# Patient Record
Sex: Female | Born: 1970
Health system: Southern US, Community
[De-identification: ages and names within clinical notes are randomized; demographics above are authoritative.]

## PROBLEM LIST (undated history)

## (undated) DIAGNOSIS — J45909 Unspecified asthma, uncomplicated: Secondary | ICD-10-CM

## (undated) DIAGNOSIS — Z862 Personal history of diseases of the blood and blood-forming organs and certain disorders involving the immune mechanism: Secondary | ICD-10-CM

## (undated) DIAGNOSIS — N83209 Unspecified ovarian cyst, unspecified side: Secondary | ICD-10-CM

## (undated) DIAGNOSIS — K449 Diaphragmatic hernia without obstruction or gangrene: Secondary | ICD-10-CM

## (undated) DIAGNOSIS — E079 Disorder of thyroid, unspecified: Secondary | ICD-10-CM

## (undated) DIAGNOSIS — E039 Hypothyroidism, unspecified: Secondary | ICD-10-CM

## (undated) DIAGNOSIS — K760 Fatty (change of) liver, not elsewhere classified: Secondary | ICD-10-CM

## (undated) DIAGNOSIS — K219 Gastro-esophageal reflux disease without esophagitis: Secondary | ICD-10-CM

## (undated) DIAGNOSIS — Z8719 Personal history of other diseases of the digestive system: Secondary | ICD-10-CM

## (undated) DIAGNOSIS — R0600 Dyspnea, unspecified: Secondary | ICD-10-CM

## (undated) DIAGNOSIS — Z973 Presence of spectacles and contact lenses: Secondary | ICD-10-CM

## (undated) DIAGNOSIS — N6009 Solitary cyst of unspecified breast: Secondary | ICD-10-CM

## (undated) DIAGNOSIS — D229 Melanocytic nevi, unspecified: Secondary | ICD-10-CM

## (undated) DIAGNOSIS — J302 Other seasonal allergic rhinitis: Secondary | ICD-10-CM

## (undated) DIAGNOSIS — Z90710 Acquired absence of both cervix and uterus: Secondary | ICD-10-CM

## (undated) DIAGNOSIS — Z9079 Acquired absence of other genital organ(s): Secondary | ICD-10-CM

## (undated) DIAGNOSIS — K851 Biliary acute pancreatitis without necrosis or infection: Secondary | ICD-10-CM

## (undated) DIAGNOSIS — N920 Excessive and frequent menstruation with regular cycle: Secondary | ICD-10-CM

## (undated) DIAGNOSIS — K829 Disease of gallbladder, unspecified: Secondary | ICD-10-CM

## (undated) DIAGNOSIS — K589 Irritable bowel syndrome without diarrhea: Secondary | ICD-10-CM

## (undated) DIAGNOSIS — M722 Plantar fascial fibromatosis: Secondary | ICD-10-CM

## (undated) DIAGNOSIS — F411 Generalized anxiety disorder: Secondary | ICD-10-CM

## (undated) DIAGNOSIS — F419 Anxiety disorder, unspecified: Secondary | ICD-10-CM

## (undated) DIAGNOSIS — K5900 Constipation, unspecified: Secondary | ICD-10-CM

## (undated) DIAGNOSIS — R928 Other abnormal and inconclusive findings on diagnostic imaging of breast: Secondary | ICD-10-CM

## (undated) DIAGNOSIS — D649 Anemia, unspecified: Secondary | ICD-10-CM

## (undated) DIAGNOSIS — Z90722 Acquired absence of ovaries, bilateral: Secondary | ICD-10-CM

## (undated) DIAGNOSIS — E669 Obesity, unspecified: Secondary | ICD-10-CM

## (undated) HISTORY — DX: Dyspnea, unspecified: R06.00

## (undated) HISTORY — DX: Obesity, unspecified: E66.9

## (undated) HISTORY — DX: Generalized anxiety disorder: F41.1

## (undated) HISTORY — DX: Constipation, unspecified: K59.00

## (undated) HISTORY — PX: TUBAL LIGATION: SHX77

## (undated) HISTORY — DX: Acquired absence of other genital organ(s): Z90.79

## (undated) HISTORY — DX: Anemia, unspecified: D64.9

## (undated) HISTORY — DX: Excessive and frequent menstruation with regular cycle: N92.0

## (undated) HISTORY — DX: Gastro-esophageal reflux disease without esophagitis: K21.9

## (undated) HISTORY — DX: Fatty (change of) liver, not elsewhere classified: K76.0

## (undated) HISTORY — DX: Other seasonal allergic rhinitis: J30.2

## (undated) HISTORY — DX: Diaphragmatic hernia without obstruction or gangrene: K44.9

## (undated) HISTORY — DX: Solitary cyst of unspecified breast: N60.09

## (undated) HISTORY — PX: BREAST EXCISIONAL BIOPSY: SUR124

## (undated) HISTORY — DX: Unspecified ovarian cyst, unspecified side: N83.209

## (undated) HISTORY — DX: Acquired absence of both cervix and uterus: Z90.710

## (undated) HISTORY — DX: Acquired absence of ovaries, bilateral: Z90.722

## (undated) HISTORY — DX: Plantar fascial fibromatosis: M72.2

## (undated) HISTORY — PX: BREAST BIOPSY: SHX20

## (undated) HISTORY — DX: Melanocytic nevi, unspecified: D22.9

## (undated) HISTORY — DX: Irritable bowel syndrome without diarrhea: K58.9

## (undated) HISTORY — DX: Biliary acute pancreatitis without necrosis or infection: K85.10

## (undated) HISTORY — DX: Disease of gallbladder, unspecified: K82.9

## (undated) HISTORY — PX: ABDOMINAL HYSTERECTOMY: SHX81

---

## 2014-10-20 ENCOUNTER — Emergency Department (HOSPITAL_COMMUNITY): Payer: 59

## 2014-10-20 ENCOUNTER — Encounter (HOSPITAL_COMMUNITY): Payer: Self-pay | Admitting: Emergency Medicine

## 2014-10-20 ENCOUNTER — Telehealth: Payer: Self-pay | Admitting: Family Medicine

## 2014-10-20 ENCOUNTER — Emergency Department (HOSPITAL_COMMUNITY)
Admission: EM | Admit: 2014-10-20 | Discharge: 2014-10-20 | Disposition: A | Discharge status: Home / Self Care | Payer: 59 | Attending: Emergency Medicine | Admitting: Emergency Medicine

## 2014-10-20 DIAGNOSIS — R0602 Shortness of breath: Secondary | ICD-10-CM | POA: Diagnosis present

## 2014-10-20 DIAGNOSIS — Z7951 Long term (current) use of inhaled steroids: Secondary | ICD-10-CM | POA: Diagnosis not present

## 2014-10-20 DIAGNOSIS — Z720 Tobacco use: Secondary | ICD-10-CM | POA: Diagnosis not present

## 2014-10-20 DIAGNOSIS — E079 Disorder of thyroid, unspecified: Secondary | ICD-10-CM | POA: Diagnosis not present

## 2014-10-20 DIAGNOSIS — E669 Obesity, unspecified: Secondary | ICD-10-CM | POA: Diagnosis not present

## 2014-10-20 DIAGNOSIS — R0600 Dyspnea, unspecified: Secondary | ICD-10-CM

## 2014-10-20 DIAGNOSIS — Z3202 Encounter for pregnancy test, result negative: Secondary | ICD-10-CM | POA: Diagnosis not present

## 2014-10-20 DIAGNOSIS — R0609 Other forms of dyspnea: Secondary | ICD-10-CM

## 2014-10-20 DIAGNOSIS — Z79899 Other long term (current) drug therapy: Secondary | ICD-10-CM | POA: Diagnosis not present

## 2014-10-20 DIAGNOSIS — J45901 Unspecified asthma with (acute) exacerbation: Secondary | ICD-10-CM | POA: Insufficient documentation

## 2014-10-20 HISTORY — DX: Disorder of thyroid, unspecified: E07.9

## 2014-10-20 HISTORY — DX: Unspecified asthma, uncomplicated: J45.909

## 2014-10-20 LAB — CBC WITH DIFFERENTIAL/PLATELET
BASOS ABS: 0 10*3/uL (ref 0.0–0.1)
Basophils Relative: 1 % (ref 0–1)
EOS PCT: 5 % (ref 0–5)
Eosinophils Absolute: 0.3 10*3/uL (ref 0.0–0.7)
HCT: 33.1 % — ABNORMAL LOW (ref 36.0–46.0)
Hemoglobin: 10.5 g/dL — ABNORMAL LOW (ref 12.0–15.0)
LYMPHS ABS: 1.7 10*3/uL (ref 0.7–4.0)
LYMPHS PCT: 23 % (ref 12–46)
MCH: 25.4 pg — ABNORMAL LOW (ref 26.0–34.0)
MCHC: 31.7 g/dL (ref 30.0–36.0)
MCV: 80 fL (ref 78.0–100.0)
Monocytes Absolute: 0.6 10*3/uL (ref 0.1–1.0)
Monocytes Relative: 8 % (ref 3–12)
NEUTROS ABS: 4.8 10*3/uL (ref 1.7–7.7)
NEUTROS PCT: 65 % (ref 43–77)
PLATELETS: 484 10*3/uL — AB (ref 150–400)
RBC: 4.14 MIL/uL (ref 3.87–5.11)
RDW: 15.5 % (ref 11.5–15.5)
WBC: 7.4 10*3/uL (ref 4.0–10.5)

## 2014-10-20 LAB — I-STAT CHEM 8, ED
BUN: 11 mg/dL (ref 6–23)
CHLORIDE: 105 mmol/L (ref 96–112)
Calcium, Ion: 1.14 mmol/L (ref 1.12–1.23)
Creatinine, Ser: 0.8 mg/dL (ref 0.50–1.10)
Glucose, Bld: 88 mg/dL (ref 70–99)
HCT: 34 % — ABNORMAL LOW (ref 36.0–46.0)
HEMOGLOBIN: 11.6 g/dL — AB (ref 12.0–15.0)
POTASSIUM: 3.9 mmol/L (ref 3.5–5.1)
Sodium: 140 mmol/L (ref 135–145)
TCO2: 20 mmol/L (ref 0–100)

## 2014-10-20 LAB — I-STAT TROPONIN, ED: TROPONIN I, POC: 0 ng/mL (ref 0.00–0.08)

## 2014-10-20 LAB — TSH: TSH: 3.617 u[IU]/mL (ref 0.350–4.500)

## 2014-10-20 LAB — POC URINE PREG, ED: PREG TEST UR: NEGATIVE

## 2014-10-20 LAB — D-DIMER, QUANTITATIVE: D-Dimer, Quant: 0.59 ug/mL-FEU — ABNORMAL HIGH (ref 0.00–0.48)

## 2014-10-20 LAB — BRAIN NATRIURETIC PEPTIDE: B Natriuretic Peptide: 74.4 pg/mL (ref 0.0–100.0)

## 2014-10-20 MED ORDER — LEVOTHYROXINE SODIUM 50 MCG PO TABS: 75.0000 ug | ORAL_TABLET | Freq: Every day | ORAL | Status: DC

## 2014-10-20 MED ORDER — IOHEXOL 350 MG/ML SOLN
100.0000 mL | Freq: Once | INTRAVENOUS | Status: AC | PRN
  Administered 2014-10-20: 100 mL via INTRAVENOUS

## 2014-10-20 MED ORDER — CITALOPRAM HYDROBROMIDE 20 MG PO TABS: 20.0000 mg | ORAL_TABLET | Freq: Every day | ORAL | Status: DC

## 2014-10-20 NOTE — Telephone Encounter (Signed)
Pt states the breast center will not see her w/out referrral.  They will not accept one from the ED dept.  D=should we do an appt for the referral/breast mass and still keep the est appt? Pt is very anxious and wants to get diagnosotic mammogram asap.

## 2014-10-20 NOTE — Telephone Encounter (Signed)
Pt has been scheduled.  °

## 2014-10-20 NOTE — Telephone Encounter (Signed)
Pt went to ed today for upper resp issue.  Has an appt to est w/ you on 4/8.  However, when they did a chest xray, they found a mass in her breast.  Pt would like to know if she could get in sooner. gave pt number for the breast center, and will cb if she needs further assistance.

## 2014-10-20 NOTE — ED Notes (Signed)
Pt transported to CT. Husband remains in room. Drink offered to husband however he declines.

## 2014-10-20 NOTE — ED Notes (Signed)
Pt returned to CT. 

## 2014-10-20 NOTE — Progress Notes (Signed)
  CARE MANAGEMENT ED NOTE 10/20/2014  Patient:  Rachels,Honore   Account Number:  [id number]  Date Initiated:  10/20/2014  Documentation initiated by:  Jackelyn Poling  Subjective/Objective Assessment:   44 yr old [place] umr pt c/o sob chest pain     Subjective/Objective Assessment Detail:   Pcp  but appt is in weeks  Pt mentioned concerns with getting a mammogram after informed she had a cyst  CM discussed with pt nurse  CM provided pt with breast cent [address] [place] and discussed referral from pcp or ob gyn Pt is aware how to obtain a list of pcp, ob gyns from [employer] website  Husband at bedside     Action/Plan:   CM inquired about pcp [facility] ED CM spoke with pt on how to obtain an in network pcp with insurance coverage via the customer service number or web site  Cm reviewed ED level of care for crisis/emergent services and community pcp level of care   Action/Plan Detail:   to manage continuous or chronic medical concerns.  Pt voiced understanding CM encouraged pt & discussed pt's responsibility to verify with pt's insurance carrier that any recommended medical provider offered by any dr Jobe Igo understanding   Anticipated DC Date:       Status Recommendation to Physician:   Result of Recommendation:    Other ED Services  Consult Working [place]  Other  Outpatient Services - Pt will follow up  PCP issues    Choice offered to / List presented to:            Status of service:  Completed, signed off  ED Comments:   ED Comments Detail:

## 2014-10-20 NOTE — ED Provider Notes (Signed)
CSN: [id number]     Arrival date & time 10/20/14  0813 History   First MD Initiated Contact with Patient 10/20/14 [phone]     Chief Complaint  Patient presents with  . Shortness of Breath     (Consider location/radiation/quality/duration/timing/severity/associated sxs/prior Treatment) Patient is a 44 y.o. female presenting with shortness of breath. The history is provided by the patient and the spouse. No language interpreter was used.  Shortness of Breath Associated symptoms: chest pain   Associated symptoms: no abdominal pain, no fever, no headaches and no vomiting   Maria [name] has a history of asthma and hypothyroidism and presents today for several weeks of shortness of breath on exertion and a few intermittent sharp chest pain epidoses that last a few seconds.  She says the shortness of breath has been worse in the last 2 days when walking short distances. She is also complaining of cold intolerance and fatigue. She has not had a TSH level checked in over a year. She has never had a cardiac workup, no stress test, no ECHO. She is not on hormone therapy, no history of DVT. She denies any fever, nausea, vomiting, abdominal pain, hemoptysis, or leg pain. Past Medical History  Diagnosis Date  . Thyroid disease   . Asthma    Past Surgical History  Procedure Laterality Date  . Cesarean section     No family history on file. History  Substance Use Topics  . Smoking status: Current Some Day Smoker  . Smokeless tobacco: Not on file  . Alcohol Use: Yes     Comment: socially   OB History    No data available     Review of Systems  Constitutional: Negative for fever and chills.  Respiratory: Positive for shortness of breath.   Cardiovascular: Positive for chest pain.  Gastrointestinal: Negative for nausea, vomiting, abdominal pain and blood in stool.  Endocrine: Positive for cold intolerance.  Neurological: Negative for syncope and headaches.  All other systems reviewed and are  negative.     Allergies  Review of patient's allergies indicates no known allergies.  Home Medications   Prior to Admission medications   Medication Sig Start Date End Date Taking? Authorizing Provider  acetaminophen (TYLENOL) 500 MG tablet Take 1,500 mg by mouth every 6 (six) hours as needed for mild pain, moderate pain or headache.   Yes Historical Provider, MD  albuterol (PROVENTIL HFA;VENTOLIN HFA) 108 (90 BASE) MCG/ACT inhaler Inhale 1 puff into the lungs every 6 (six) hours as needed for wheezing or shortness of breath.   Yes Historical Provider, MD  citalopram (CELEXA) 20 MG tablet Take 1 tablet (20 mg total) by mouth daily. 10/20/14   Maria Nordstrom Patel-Mills, PA-C  Fluticasone-Salmeterol (ADVAIR) 250-50 MCG/DOSE AEPB Inhale 1 puff into the lungs daily as needed (for shortness of breath).   Yes Historical Provider, MD  levothyroxine (SYNTHROID, LEVOTHROID) 50 MCG tablet Take 1.5 tablets (75 mcg total) by mouth daily before breakfast. 10/20/14   Maria Glazier, PA-C  loratadine (CLARITIN) 10 MG tablet Take 10 mg by mouth daily.   Yes Historical Provider, MD   BP 126/69 mmHg  Pulse 66  Temp(Src) 98 F (36.7 C) (Oral)  Resp 16  SpO2 100%  LMP 10/20/2014 Physical Exam  Constitutional: She is oriented to person, place, and time. She appears well-developed and well-nourished.  HENT:  Head: Normocephalic and atraumatic.  Eyes: Conjunctivae are normal.  Neck: Normal range of motion. Neck supple.  Cardiovascular: Normal rate, regular rhythm and normal  heart sounds.   Pulmonary/Chest: Effort normal and breath sounds normal. No respiratory distress. She has no decreased breath sounds. She has no wheezes. She has no rales.  She is breathing comfortably on exam without use of accessory muscles.  She has 100% oxygen saturation at bedside. No reproducible chest wall tenderness.   Abdominal: Soft. There is no tenderness.  Obese  Musculoskeletal: Normal range of motion.  No leg or calf  tenderness.   Neurological: She is alert and oriented to person, place, and time.  Skin: Skin is warm and dry.  Nursing note and vitals reviewed.   ED Course  Procedures (including critical care time) Labs Review Labs Reviewed  CBC WITH DIFFERENTIAL/PLATELET - Abnormal; Notable for the following:    Hemoglobin 10.5 (*)    HCT 33.1 (*)    MCH 25.4 (*)    Platelets 484 (*)    All other components within normal limits  D-DIMER, QUANTITATIVE - Abnormal; Notable for the following:    D-Dimer, Quant 0.59 (*)    All other components within normal limits  I-STAT CHEM 8, ED - Abnormal; Notable for the following:    Hemoglobin 11.6 (*)    HCT 34.0 (*)    All other components within normal limits  TSH  BRAIN NATRIURETIC PEPTIDE  I-STAT TROPOININ, ED  POC URINE PREG, ED    Imaging Review Dg Chest 2 View  10/20/2014   CLINICAL DATA:  Shortness of breath. Central chest pain, fatigue for several days.  EXAM: CHEST  2 VIEW  COMPARISON:  None.  FINDINGS: The heart size and mediastinal contours are within normal limits. Both lungs are clear. The visualized skeletal structures are unremarkable.  IMPRESSION: No active cardiopulmonary disease.   Electronically Signed   By: Rolm Baptise M.D.   On: 10/20/2014 09:27   Ct Angio Chest Pe W/cm &/or Wo Cm  10/20/2014   CLINICAL DATA:  Dyspnea, chest pain for 2 weeks  EXAM: CT ANGIOGRAPHY CHEST WITH CONTRAST  TECHNIQUE: Multidetector CT imaging of the chest was performed using the standard protocol during bolus administration of intravenous contrast. Multiplanar CT image reconstructions and MIPs were obtained to evaluate the vascular anatomy.  CONTRAST:  151mL OMNIPAQUE IOHEXOL 350 MG/ML SOLN  COMPARISON:  None.  FINDINGS: There is probable a cyst within left breast measures 2.8 cm. Clinical correlation is necessary. Further correlation with mammogram could be performed.  Images of the thoracic inlet are unremarkable. Central airways are patent. No mediastinal  hematoma or adenopathy.  No pulmonary embolus is noted.  Images of the lung parenchyma shows no acute infiltrate or pleural effusion. No focal consolidation. No pulmonary nodules. There is no pneumothorax. No pericardial effusion is noted. Heart size within normal limits. The visualized upper abdomen is unremarkable.  Review of the MIP images confirms the above findings.  IMPRESSION: 1. No pulmonary embolus is noted. 2. No acute infiltrate or pulmonary edema. 3. No mediastinal hematoma or adenopathy. Probable cyst within left breast measures 2.8 cm. Clinical correlation is necessary. Further evaluation with mammogram could be performed.   Electronically Signed   By: Lahoma Crocker M.D.   On: 10/20/2014 12:35     EKG Interpretation   Date/Time:  Monday October 20 2014 08:25:16 EST Ventricular Rate:  77 PR Interval:  146 QRS Duration: 103 QT Interval:  438 QTC Calculation: 496 R Axis:   51 Text Interpretation:  Sinus rhythm Borderline prolonged QT interval No  acute changes No old tracing to compare Confirmed by NANAVATI,  MD, Thelma Comp  [phone]) on 10/20/2014 8:28:37 AM      MDM   Final diagnoses:  Dyspnea  Exertional dyspnea   Dr. Kathrynn Humble and I looked at both [facility] criteria for pulmonary embolism as well as PERC criteria.  She does not have any risk factors for PE and is not tachycardic.  She is at low risk but due to her shortness of breath on minimal exertion, will order D-dimer.   CBC shows Hgb of 10.5 but patient states she has a heavy mestral cycle. Her creatinine and electrolytes are normal. TSH is normal, BNP normal, troponin is negative and EKG is normal sinus rhythm at a rate of 77. CXR shows no cardiopulmonary disease.  D-dimer is elevated at .59.  I have ordered a urine pregnancy and CTA.   10:22 Spoke with patient and spouse regarding CTA and they agree with the plan.   12:57 Discussed CTA findings of no acute PE and no pulmonary edema. I also discussed the cyst within the left breast  measuring 2.8 cm and to f/u with a PCP in the next 2 days for a mammogram. I did a breast exam and could feel a small, mobile cyst at the 7o'clock position.  No signs of abscess or infection.  She is feeling better and has 100% oxygen stat with no shortness of breath. Will discharge her with f/u for shortness of breath and left breast cyst.  Her and her husband just moved here from [place] and only have a couple of pills left for levothyroxine and citalopram so I gave them a 10 day supply until they f/u with a new PCP.   Maria Glazier, PA-C 10/20/14 1912  Varney Biles, MD 10/21/14 [phone]

## 2014-10-20 NOTE — Telephone Encounter (Signed)
Ok to schedule 30 minute ED follow up - but keep new pt appt. Thanks

## 2014-10-20 NOTE — ED Notes (Signed)
Labs drawn by Garald Balding NT with pt return.

## 2014-10-20 NOTE — ED Notes (Signed)
Pt reports chronic intermittent central chest pain accompanied with SOB, fatigue, hypothermia for 2 years. Pt reports recurrent episode of same for 2 weeks. Pt hx of hypothyroidism. Pt denies chest pain at present time.

## 2014-10-20 NOTE — ED Notes (Signed)
Pt in DG at present time; will collect labs with pt return.

## 2014-10-20 NOTE — ED Notes (Signed)
Per pt, states SOB and pains in her upper chest on and off for days-states increased lethergy

## 2014-10-21 ENCOUNTER — Encounter: Payer: Self-pay | Admitting: Family Medicine

## 2014-10-21 ENCOUNTER — Ambulatory Visit (INDEPENDENT_AMBULATORY_CARE_PROVIDER_SITE_OTHER): Payer: 59 | Admitting: Family Medicine

## 2014-10-21 ENCOUNTER — Other Ambulatory Visit: Payer: Self-pay | Admitting: Family Medicine

## 2014-10-21 VITALS — BP 100/74 | HR 69 | Temp 97.6°F | Ht 60.75 in | Wt 211.2 lb

## 2014-10-21 DIAGNOSIS — Z1239 Encounter for other screening for malignant neoplasm of breast: Secondary | ICD-10-CM

## 2014-10-21 DIAGNOSIS — D649 Anemia, unspecified: Secondary | ICD-10-CM

## 2014-10-21 DIAGNOSIS — D473 Essential (hemorrhagic) thrombocythemia: Secondary | ICD-10-CM

## 2014-10-21 DIAGNOSIS — N6002 Solitary cyst of left breast: Secondary | ICD-10-CM

## 2014-10-21 DIAGNOSIS — R0600 Dyspnea, unspecified: Secondary | ICD-10-CM

## 2014-10-21 DIAGNOSIS — R7989 Other specified abnormal findings of blood chemistry: Secondary | ICD-10-CM

## 2014-10-21 DIAGNOSIS — E039 Hypothyroidism, unspecified: Secondary | ICD-10-CM

## 2014-10-21 DIAGNOSIS — Z7189 Other specified counseling: Secondary | ICD-10-CM

## 2014-10-21 DIAGNOSIS — N920 Excessive and frequent menstruation with regular cycle: Secondary | ICD-10-CM

## 2014-10-21 DIAGNOSIS — Z7689 Persons encountering health services in other specified circumstances: Secondary | ICD-10-CM

## 2014-10-21 MED ORDER — FLUTICASONE-SALMETEROL 250-50 MCG/DOSE IN AEPB: 1.0000 | INHALATION_SPRAY | Freq: Every day | RESPIRATORY_TRACT | Status: DC | PRN

## 2014-10-21 MED ORDER — LEVOTHYROXINE SODIUM 75 MCG PO TABS: 75.0000 ug | ORAL_TABLET | Freq: Every day | ORAL | Status: DC

## 2014-10-21 MED ORDER — CITALOPRAM HYDROBROMIDE 20 MG PO TABS: 20.0000 mg | ORAL_TABLET | Freq: Every day | ORAL | Status: DC

## 2014-10-21 NOTE — Progress Notes (Signed)
Pre visit review using our clinic review tool, if applicable. No additional management support is needed unless otherwise documented below in the visit note. 

## 2014-10-21 NOTE — Patient Instructions (Signed)
-  We placed a referral for you as discussed for the mammogram, hematologist, breathing study for the asthma, echocardiogram for your heart and gynecology referral for the heavy bleeeding. It usually takes about 1-2 weeks to process and schedule this referral. If you have not heard from [name] regarding this appointment in 2 weeks please contact our office.  -follow up as scheduled  -advised recheck thyroid level in 6 weeks

## 2014-10-21 NOTE — Addendum Note (Signed)
Addended by: Agnes Lawrence on: 10/21/2014 11:34 AM   Modules accepted: Orders

## 2014-10-21 NOTE — Progress Notes (Addendum)
HPI:  Maria [name] is a 44 yo F with PMH sig for Hypothyroidism and asthma here for an ED follow up for a breast mass. She has never been to our clinic before as recently moved here.  L breast cyst: -found incidentally 10/20/14 on CT in ED for SOB and mammo advised by radiologist -denies: CP, nipple discharge, skin changes -FH breast ca in mother  -she reports a hx of breast cyst -no hx of biopsy  Anemia and Elevated Platelets: -on labs from ED -pt reports: extremely heavy menstrual bleeding - hx of anemia from this, reports changes super plus tampons every hour for several days with periods and periods last 8 days; reports chronic elevated platelets for a number of years - she is concerned about this and wants to see a hematologist -FDLMP: yesterday -denies: GI bleeding, abd pain, fevers, weight loss -reports she took iron in the past and this caused extreme elevation in iron and told not to take agai -reports she just wants to know what is going on  Hypothyroidism: -needs refills on synthroid - reports was on 50, but told needs 62mcg this refill -TSH high normal 3/7 -denies: hx thyroid ca -reports chronic fatigue, cold intol  DOE, intermittent SOB: -for several years, CP intermittently in last week - lasted only a few seconds -has asthma - uses albuterol a few times per week - ran out of advair about 1 week ago -normal EKG -worried about asthma and her heart  Asthma/Allergies: -on claritin, alb, advair  ROS: See pertinent positives and negatives per HPI.  Past Medical History  Diagnosis Date  . Thyroid disease   . Asthma     Past Surgical History  Procedure Laterality Date  . Cesarean section      Family History  Problem Relation Age of Onset  . Breast cancer Mother   . Breast cancer Paternal Grandmother   . Cervical cancer Maternal Grandmother     History   Social History  . Marital Status: Married    Spouse Name: N/A  . Number of Children: N/A  .  Years of Education: N/A   Social History Main Topics  . Smoking status: Current Some Day Smoker  . Smokeless tobacco: Not on file  . Alcohol Use: Yes     Comment: socially  . Drug Use: No  . Sexual Activity: Not on file   Other Topics Concern  . None   Social History Narrative     Current outpatient prescriptions:  .  albuterol (PROVENTIL HFA;VENTOLIN HFA) 108 (90 BASE) MCG/ACT inhaler, Inhale 1 puff into the lungs every 6 (six) hours as needed for wheezing or shortness of breath., Disp: , Rfl:  .  citalopram (CELEXA) 20 MG tablet, Take 1 tablet (20 mg total) by mouth daily., Disp: 30 tablet, Rfl: 0 .  Fluticasone-Salmeterol (ADVAIR) 250-50 MCG/DOSE AEPB, Inhale 1 puff into the lungs daily as needed (for shortness of breath)., Disp: 60 each, Rfl: 1 .  levothyroxine (SYNTHROID, LEVOTHROID) 75 MCG tablet, Take 1 tablet (75 mcg total) by mouth daily before breakfast., Disp: 30 tablet, Rfl: 0 .  loratadine (CLARITIN) 10 MG tablet, Take 10 mg by mouth daily., Disp: , Rfl:   EXAM:  Filed Vitals:   10/21/14 1047  BP: 100/74  Pulse: 69  Temp: 97.6 F (36.4 C)    Body mass index is 40.24 kg/(m^2).  GENERAL: vitals reviewed and listed above, alert, oriented, appears well hydrated and in no acute distress  HEENT: atraumatic, conjunttiva clear,  no obvious abnormalities on inspection of external nose and ears  NECK: no obvious masses on inspection  LUNGS: clear to auscultation bilaterally, no wheezes, rales or rhonchi, good air movement  Breasts: normal appearance, no LAD palpated, increased density of breasts bilat lateral breasts, in area of concern, non tender  CV: HRRR, no peripheral edema  MS: moves all extremities without noticeable abnormality  PSYCH: pleasant and cooperative, no obvious depression or anxiety  ASSESSMENT AND PLAN:  Discussed the following assessment and plan:  Elevated platelet count - Plan: Ambulatory referral to Hematology  Anemia, unspecified  anemia type - Plan: Ambulatory referral to Gynecology, Ambulatory referral to Hematology  Breast cyst, left - Plan: MM Digital Diagnostic Unilat L  Hypothyroidism, unspecified hypothyroidism type - Plan: levothyroxine (SYNTHROID, LEVOTHROID) 75 MCG tablet, DISCONTINUED: levothyroxine (SYNTHROID, LEVOTHROID) 75 MCG tablet  Encounter to establish care  Menorrhagia with regular cycle  Dyspnea - Plan: Pulmonary Function Test, 2D Echocardiogram without contrast  Breast cancer screening - Plan: [facility]  -addressed many concerns today for this acute visit -orders and referral per orders  -likely her symptoms are related to anemia, menorrhagia and asthma -Patient advised to return or notify a doctor immediately if symptoms worsen or persist or new concerns arise.  Patient Instructions  -We placed a referral for you as discussed for the mammogram, hematologist, breathing study for the asthma, echocardiogram for your heart and gynecology referral for the heavy bleeeding. It usually takes about 1-2 weeks to process and schedule this referral. If you have not heard from [name] regarding this appointment in 2 weeks please contact our office.  -follow up as scheduled  -advised recheck thyroid level in 6 weeks      KIM, HANNAH R.

## 2014-10-22 ENCOUNTER — Telehealth: Payer: Self-pay | Admitting: Family Medicine

## 2014-10-22 NOTE — Telephone Encounter (Signed)
emmi mailed  °

## 2014-10-27 ENCOUNTER — Ambulatory Visit (HOSPITAL_COMMUNITY): Payer: 59 | Attending: Cardiology | Admitting: Cardiology

## 2014-10-27 DIAGNOSIS — R0600 Dyspnea, unspecified: Secondary | ICD-10-CM | POA: Insufficient documentation

## 2014-10-27 NOTE — Progress Notes (Signed)
Echo performed. 

## 2014-10-28 ENCOUNTER — Other Ambulatory Visit: Payer: 59

## 2014-10-29 ENCOUNTER — Other Ambulatory Visit (HOSPITAL_COMMUNITY): Payer: 59

## 2014-11-04 ENCOUNTER — Ambulatory Visit
Admission: RE | Admit: 2014-11-04 | Discharge: 2014-11-04 | Disposition: A | Discharge status: Home / Self Care | Payer: 59 | Source: Ambulatory Visit | Attending: Family Medicine | Admitting: Family Medicine

## 2014-11-04 ENCOUNTER — Other Ambulatory Visit: Payer: Self-pay | Admitting: Family Medicine

## 2014-11-04 ENCOUNTER — Telehealth: Payer: Self-pay | Admitting: *Deleted

## 2014-11-04 DIAGNOSIS — N6002 Solitary cyst of left breast: Secondary | ICD-10-CM

## 2014-11-04 NOTE — Telephone Encounter (Signed)
Colletta Maryland, x-ray tech called from the [place] stating the pt feels she has a lump in her right breast and would like to have bilateral mammograms, although only a left diagnostic was ordered.  I advised Colletta Maryland per Dr Maudie Mercury the pt came in for left breast complaints; however it is fine to do a diagnostic mammogram of both breasts and the order was completed and faxed to [phone].  Colletta Maryland called back and stated the pt feels she has a problem with the right breast and I advised Colletta Maryland per Dr Maudie Mercury the pt came in for a left breast complaint at the 2 o'clock position and if she is having problems with the right breast she should come in for an appt to be evaluated for this and she stated she will inform the pt.

## 2014-11-05 ENCOUNTER — Ambulatory Visit (INDEPENDENT_AMBULATORY_CARE_PROVIDER_SITE_OTHER): Payer: 59 | Admitting: Obstetrics and Gynecology

## 2014-11-05 ENCOUNTER — Encounter: Payer: Self-pay | Admitting: Obstetrics and Gynecology

## 2014-11-05 VITALS — BP 102/78 | HR 80 | Resp 16 | Ht 60.0 in | Wt 210.0 lb

## 2014-11-05 DIAGNOSIS — N920 Excessive and frequent menstruation with regular cycle: Secondary | ICD-10-CM | POA: Diagnosis not present

## 2014-11-05 DIAGNOSIS — Z86018 Personal history of other benign neoplasm: Secondary | ICD-10-CM

## 2014-11-05 DIAGNOSIS — Z8742 Personal history of other diseases of the female genital tract: Secondary | ICD-10-CM | POA: Diagnosis not present

## 2014-11-05 DIAGNOSIS — Z01419 Encounter for gynecological examination (general) (routine) without abnormal findings: Secondary | ICD-10-CM | POA: Diagnosis not present

## 2014-11-05 NOTE — Patient Instructions (Signed)

## 2014-11-05 NOTE — Progress Notes (Signed)
44 y.o. G3P3000 MarriedCaucasianF here for  Heavy menstrual menses and anemia.  Has heavy menses and is chronically anemia. Has monthly menses and changes tampon every hour.  Cannot leave the house due to bleeding.  Menses last 8 days and then bleed again.  Lingering menses. No pain with bleeding.   Last year was seen in ER in [place].  Diagnosis was done and patient told she has fibroid(s) and thickened endometrium. Took birth control (probable combined oral contraceptive) and it did not help the bleeding.   Took iron in past for anemia.  Worked but then iron level was too high.  Feels terrible and weak. Shortness of breath.  Leg swelling.   Has hypothyroidism.  Normal TSH on 10/20/14.  Mother with breast cancer age 14.  No genetic testing in the family.  No FH of ovarian cancer.   Just had a normal ECHO.  Seeing hematologist/oncology for anemia and high platelets, [phone] in the ER recently.   Just moved from [place] for husband's work.  3 children.   Patient's last menstrual period was 10/20/2014.          Sexually active: Yes.    The current method of family planning is tubal ligation.    Exercising: No.  The patient does not participate in regular exercise at present. Smoker:  no  Health Maintenance: Pap:  ~10/2013 - normal - per pt History of abnormal Pap:  no MMG: 11/04/14 results pending Self Breast Check: No Colonoscopy:  Never BMD:   Never TDaP:  Needs one Screening Labs: PCP, Hb today: PCP, Urine today: PCP   reports that she has quit smoking. She has never used smokeless tobacco. She reports that she drinks about 0.6 oz of alcohol per week. She reports that she does not use illicit drugs.  Past Medical History  Diagnosis Date  . Thyroid disease   . Asthma     Past Surgical History  Procedure Laterality Date  . Cesarean section    . Tubal ligation      Current Outpatient Prescriptions  Medication Sig Dispense Refill  . albuterol (PROVENTIL  HFA;VENTOLIN HFA) 108 (90 BASE) MCG/ACT inhaler Inhale 1 puff into the lungs every 6 (six) hours as needed for wheezing or shortness of breath.    . citalopram (CELEXA) 20 MG tablet Take 1 tablet (20 mg total) by mouth daily. 30 tablet 0  . Fluticasone-Salmeterol (ADVAIR) 250-50 MCG/DOSE AEPB Inhale 1 puff into the lungs daily as needed (for shortness of breath). 60 each 1  . levothyroxine (SYNTHROID, LEVOTHROID) 75 MCG tablet Take 1 tablet (75 mcg total) by mouth daily before breakfast. 30 tablet 0  . loratadine (CLARITIN) 10 MG tablet Take 10 mg by mouth daily.     No current facility-administered medications for this visit.    Family History  Problem Relation Age of Onset  . Breast cancer Mother   . Breast cancer Paternal Grandmother   . Cervical cancer Maternal Grandmother     ROS:  Pertinent items are noted in HPI.  Otherwise, a comprehensive ROS was negative.  Exam:   BP 102/78 mmHg  Pulse 80  Resp 16  Ht 5' (1.524 m)  Wt 210 lb (95.255 kg)  BMI 41.01 kg/m2  LMP 10/20/2014    Height: 5' (152.4 cm)  Ht Readings from Last 3 Encounters:  11/05/14 5' (1.524 m)  10/21/14 5' 0.75" (1.543 m)    General appearance: alert, cooperative and appears stated age Head: Normocephalic, without obvious  abnormality, atraumatic Neck: no adenopathy, supple, symmetrical, trachea midline and thyroid normal to inspection and palpation Lungs: clear to auscultation bilaterally Breasts: normal appearance, no masses or tenderness, Inspection negative, No nipple retraction or dimpling, No nipple discharge or bleeding, No axillary or supraclavicular adenopathy Heart: regular rate and rhythm Abdomen: soft, non-tender; bowel sounds normal; mass to 3 cm below the umbilicus? Extremities: extremities normal, atraumatic, no cyanosis or edema Skin: Skin color, texture, turgor normal. No rashes or lesions Lymph nodes: Cervical, supraclavicular, and axillary nodes normal. No abnormal inguinal nodes  palpated Neurologic: Grossly normal   Pelvic: External genitalia:  no lesions              Urethra:  normal appearing urethra with no masses, tenderness or lesions              Bartholins and Skenes: normal                 Vagina: normal appearing vagina with normal color and discharge, no lesions              Cervix: normal to palpation.  Unable to see cervix with speculum.  Very high in vagina.               Pap taken: No. Bimanual Exam:  Uterus:  Feels 16 - 17 week size, nontender.              Adnexa: Not palpated separately from the uterus.                Rectovaginal: Confirms               Anus:  normal sphincter tone, no lesions  Chaperone was present for exam.  Assessment  Menorrhagia.  Enlarged uterus.  I suspect fibroids.  History of thickened endometrium.  Anemia and elevated platelets, likely due to anemia.   Plan    Return for pelvic ultrasound, possible sonohysterogram and EMB. This may be difficult to do because of her cervix being so high.  Written materials on fibroids.  I briefly discussed medical therapy, uterine artery embolization and hysterectomy for symptomatic fibroids.  Patient's husband may accompany her for the next office visit.  She will need treatment for her anemia.   I encourage her to follow up with the hematologist/oncologist who will determine treatment options for the anemia side of her care.   10 additional minutes spent discussing fibroids and anemia, of which over 50% was spent in counseling.

## 2014-11-06 ENCOUNTER — Telehealth: Payer: Self-pay | Admitting: Obstetrics and Gynecology

## 2014-11-06 NOTE — Telephone Encounter (Signed)
Call to patient. Advised of benefit quote received for SHGM/EMB.  Patient agreeable. Scheduled procedures. Advised patient of 72 hour cancellation policy and $169 cancellation fee. Patient agreeable.

## 2014-11-11 ENCOUNTER — Telehealth: Payer: Self-pay | Admitting: Oncology

## 2014-11-11 NOTE — Telephone Encounter (Signed)
Called pt and scheduled new pt appt with Dr. Alen Blew.   Shadad 12/18/14 10:30 am  Dx:  Elevated Platelet Count/Anemia, unspecified type Referring: Dr. Maudie Mercury

## 2014-11-20 ENCOUNTER — Ambulatory Visit (INDEPENDENT_AMBULATORY_CARE_PROVIDER_SITE_OTHER): Payer: 59 | Admitting: Obstetrics and Gynecology

## 2014-11-20 ENCOUNTER — Other Ambulatory Visit: Payer: Self-pay | Admitting: Obstetrics and Gynecology

## 2014-11-20 ENCOUNTER — Ambulatory Visit (INDEPENDENT_AMBULATORY_CARE_PROVIDER_SITE_OTHER): Payer: 59

## 2014-11-20 ENCOUNTER — Encounter: Payer: Self-pay | Admitting: Obstetrics and Gynecology

## 2014-11-20 DIAGNOSIS — N920 Excessive and frequent menstruation with regular cycle: Secondary | ICD-10-CM | POA: Diagnosis not present

## 2014-11-20 NOTE — Progress Notes (Signed)
Subjective   Patient is here for pelvic ultrasound and possible sonohysterogram and endometrial biopsy for menorrhagia with regular cycles.  Told in past that she has fibroids and thickened endometrium.  Bleeding is causing anemia and prevents activity due to heaviness of flow.  Status post Cesarean Section x 3 and BTL.  LMP 11/14/14.   Patient given Ibuprofen 800 mg after ultrasound performed.   Objective - husband present for the EMB and discussion with me reviewing the ultrasound.   Pelvic ultrasound images and report reviewed in real time with patient and husband.  Uterus - no fibroids.   EMS 4.62 mm.  Ovaries normal.  No free fluid.        Procedure - endometrial biopsy Consent performed. Speculum place in vagina.  Cervix very high. Sterile prep of cervix with betadine.  Paracervical block with 10 cc 1% lidocaine - lot number [id number], Expiration 01/14/2015 Pipelle placed to   5       cm without difficulty twice. Tissue obtained and sent to pathology. Speculum removed.  No complications.  Assessment  Menorrhagia.  Normal pelvic ultrasound. Status post C/S x 3.  Status post BTL.   Plan  Follow up EMB.  Return for talking visit in 7 - 10 days.  Post biopsy instructions given.   After visit summary to patient.

## 2014-11-20 NOTE — Patient Instructions (Signed)
Endometrial Biopsy, Care After Refer to this sheet in the next few weeks. These instructions provide you with information on caring for yourself after your procedure. Your health care provider may also give you more specific instructions. Your treatment has been planned according to current medical practices, but problems sometimes occur. Call your health care provider if you have any problems or questions after your procedure. WHAT TO EXPECT AFTER THE PROCEDURE After your procedure, it is typical to have the following:  You may have mild cramping and a small amount of vaginal bleeding for a few days after the procedure. This is normal. HOME CARE INSTRUCTIONS  Only take over-the-counter or prescription medicine as directed by your health care provider.  Do not douche, use tampons, or have sexual intercourse until your health care provider approves.  Follow your health care provider's instructions regarding any activity restrictions, such as strenuous exercise or heavy lifting. SEEK MEDICAL CARE IF:  You have heavy bleeding or bleeding longer than 2 days after the procedure.  You have bad smelling drainage from your vagina.  You have a fever and chills.  Youhave severe lower stomach (abdominal) pain. SEEK IMMEDIATE MEDICAL CARE IF:  You have severe cramps in your stomach or back.  You pass large blood clots.  Your bleeding increases.  You become weak or lightheaded, or you pass out. Document Released: 05/22/2013 Document Reviewed: 05/22/2013 ExitCare Patient Information 2015 [employer]. This information is not intended to replace advice given to you by your health care provider. Make sure you discuss any questions you have with your health care provider.  

## 2014-11-21 ENCOUNTER — Encounter: Payer: Self-pay | Admitting: Family Medicine

## 2014-11-21 ENCOUNTER — Ambulatory Visit (INDEPENDENT_AMBULATORY_CARE_PROVIDER_SITE_OTHER): Payer: 59 | Admitting: Family Medicine

## 2014-11-21 VITALS — BP 104/80 | HR 88 | Temp 97.7°F | Ht 60.0 in

## 2014-11-21 DIAGNOSIS — F418 Other specified anxiety disorders: Secondary | ICD-10-CM

## 2014-11-21 DIAGNOSIS — N6002 Solitary cyst of left breast: Secondary | ICD-10-CM

## 2014-11-21 DIAGNOSIS — Z23 Encounter for immunization: Secondary | ICD-10-CM

## 2014-11-21 DIAGNOSIS — N920 Excessive and frequent menstruation with regular cycle: Secondary | ICD-10-CM

## 2014-11-21 DIAGNOSIS — N6009 Solitary cyst of unspecified breast: Secondary | ICD-10-CM | POA: Insufficient documentation

## 2014-11-21 DIAGNOSIS — D649 Anemia, unspecified: Secondary | ICD-10-CM | POA: Diagnosis not present

## 2014-11-21 DIAGNOSIS — J45909 Unspecified asthma, uncomplicated: Secondary | ICD-10-CM | POA: Insufficient documentation

## 2014-11-21 DIAGNOSIS — M722 Plantar fascial fibromatosis: Secondary | ICD-10-CM | POA: Insufficient documentation

## 2014-11-21 DIAGNOSIS — J4520 Mild intermittent asthma, uncomplicated: Secondary | ICD-10-CM

## 2014-11-21 DIAGNOSIS — E669 Obesity, unspecified: Secondary | ICD-10-CM

## 2014-11-21 DIAGNOSIS — F411 Generalized anxiety disorder: Secondary | ICD-10-CM

## 2014-11-21 DIAGNOSIS — J302 Other seasonal allergic rhinitis: Secondary | ICD-10-CM

## 2014-11-21 DIAGNOSIS — Z0000 Encounter for general adult medical examination without abnormal findings: Secondary | ICD-10-CM | POA: Diagnosis not present

## 2014-11-21 DIAGNOSIS — F329 Major depressive disorder, single episode, unspecified: Secondary | ICD-10-CM

## 2014-11-21 DIAGNOSIS — F419 Anxiety disorder, unspecified: Secondary | ICD-10-CM

## 2014-11-21 DIAGNOSIS — E039 Hypothyroidism, unspecified: Secondary | ICD-10-CM | POA: Insufficient documentation

## 2014-11-21 DIAGNOSIS — E038 Other specified hypothyroidism: Secondary | ICD-10-CM

## 2014-11-21 HISTORY — DX: Generalized anxiety disorder: F41.1

## 2014-11-21 HISTORY — DX: Other seasonal allergic rhinitis: J30.2

## 2014-11-21 MED ORDER — CITALOPRAM HYDROBROMIDE 20 MG PO TABS: 20.0000 mg | ORAL_TABLET | Freq: Every day | ORAL | Status: DC

## 2014-11-21 MED ORDER — FLUTICASONE-SALMETEROL 250-50 MCG/DOSE IN AEPB: 1.0000 | INHALATION_SPRAY | Freq: Every day | RESPIRATORY_TRACT | Status: DC | PRN

## 2014-11-21 MED ORDER — LORATADINE 10 MG PO TABS: 10.0000 mg | ORAL_TABLET | Freq: Every day | ORAL | Status: DC

## 2014-11-21 MED ORDER — LEVOTHYROXINE SODIUM 75 MCG PO TABS: 75.0000 ug | ORAL_TABLET | Freq: Every day | ORAL | Status: DC

## 2014-11-21 MED ORDER — ALBUTEROL SULFATE HFA 108 (90 BASE) MCG/ACT IN AERS: 1.0000 | INHALATION_SPRAY | Freq: Four times a day (QID) | RESPIRATORY_TRACT | Status: DC | PRN

## 2014-11-21 NOTE — Progress Notes (Signed)
Pre visit review using our clinic review tool, if applicable. No additional management support is needed unless otherwise documented below in the visit note. 

## 2014-11-21 NOTE — Progress Notes (Signed)
HPI:  Maria [name] is here to establish care. She saw me for an acute visit for multiple issues after ED visit in 10/2014. Last PCP and physical: with gyn in 10/2014  Has the following chronic problems that require follow up and concerns today:  Breast cyst: -noticed in ED 1 month ago on CT -per radiologist report, stable 3 cm left upper breast cyst, and mammo in 1 year advised -denies: breast pain, changes in breast tissue  Menorrhagia -chronic heavy menstrual bleeding -pt seeing gyn, had TVUS, now scheduled for EMB -[facility] April 1st,  -denies palpitations, syncope, dizziness  Anemia and elevated platelets: -likely from her heavy bleeding, pt wanted to see hematologist and referral placed last visit -denies: palpitations, dizziness, -reports told in the past she can not take iron because levels of iron "quadruple" when she does  Obesity: -diet and exercise: she is trying to work on diet, trying to walk -tendinitis L foot/plantar fasciitis -reports this interferes with ability to exercise -denies: weakness, numbness, trauma  Hypothyroid: -started: dx 1 year ago -denies: hx ca or goiter -meds: synthroid 75 mcg  Depression and anxiety (mostly anxiety): -meds: celexa -started: in 2013 -symptoms: mostly anxiety -denies: SI, panic, manic symptoms  Seasonal allergies/mod persistent asthma: -started: started in her adult life -treatments: advair daily, alb prn -worse with: spring and fall, was allergy tested remotely -meds: claritin -denies: wheezing, SOB   ROS negative for unless reported above: fevers, unintentional weight loss, hearing or vision loss, chest pain, palpitations, struggling to breath, hemoptysis, melena, hematochezia, hematuria, falls, loc, si, thoughts of self harm  Past Medical History  Diagnosis Date  . Thyroid disease   . Asthma   . Generalized anxiety disorder 11/21/2014  . Menorrhagia 11/21/2014    -seeing gyn    . Anemia 11/21/2014    -with  elevated platelets   . Obesity 11/21/2014  . Breast cyst 11/21/2014    -L breast, upper L just off from center -stable per radiology 10/2014   . Seasonal allergies 11/21/2014  . Plantar fasciitis of left foot 11/21/2014    Past Surgical History  Procedure Laterality Date  . Cesarean section    . Tubal ligation      Family History  Problem Relation Age of Onset  . Breast cancer Mother   . Breast cancer Paternal Grandmother   . Cervical cancer Maternal Grandmother   . Arthritis Maternal Grandmother   . CVA Maternal Grandmother     History   Social History  . Marital Status: Married    Spouse Name: N/A  . Number of Children: N/A  . Years of Education: N/A   Social History Main Topics  . Smoking status: Former Research scientist (life sciences)  . Smokeless tobacco: Never Used  . Alcohol Use: 0.6 oz/week    1 Glasses of wine per week     Comment: socially  . Drug Use: No  . Sexual Activity: Yes    Birth Control/ Protection: Surgical   Other Topics Concern  . None   Social History Narrative     Current outpatient prescriptions:  .  albuterol (PROVENTIL HFA;VENTOLIN HFA) 108 (90 BASE) MCG/ACT inhaler, Inhale 1 puff into the lungs every 6 (six) hours as needed for wheezing or shortness of breath., Disp: 3 Inhaler, Rfl: 0 .  citalopram (CELEXA) 20 MG tablet, Take 1 tablet (20 mg total) by mouth daily., Disp: 90 tablet, Rfl: 0 .  Fluticasone-Salmeterol (ADVAIR) 250-50 MCG/DOSE AEPB, Inhale 1 puff into the lungs daily as needed (for  shortness of breath)., Disp: 60 each, Rfl: 3 .  levothyroxine (SYNTHROID, LEVOTHROID) 75 MCG tablet, Take 1 tablet (75 mcg total) by mouth daily before breakfast., Disp: 90 tablet, Rfl: 0 .  loratadine (CLARITIN) 10 MG tablet, Take 1 tablet (10 mg total) by mouth daily., Disp: 90 tablet, Rfl: 0  EXAM:  Filed Vitals:   11/21/14 0808  BP: 104/80  Pulse: 88  Temp: 97.7 F (36.5 C)    Body mass index is 0.00 kg/(m^2).  GENERAL: vitals reviewed and listed above, alert,  oriented, appears well hydrated and in no acute distress  HEENT: atraumatic, conjunttiva clear, no obvious abnormalities on inspection of external nose and ears  NECK: no obvious masses on inspection  LUNGS: clear to auscultation bilaterally, no wheezes, rales or rhonchi, good air movement  CV: HRRR, no peripheral edema  MS: moves all extremities without noticeable abnormality  PSYCH: pleasant and cooperative, no obvious depression or anxiety  ASSESSMENT AND PLAN:  Discussed the following assessment and plan:  Breast cyst, left  Menorrhagia with regular cycle  Anemia, unspecified anemia type  Other specified hypothyroidism  Anxiety and depression  Obesity  Seasonal allergies  Asthma, chronic, mild intermittent, uncomplicated  Generalized anxiety disorder  Hypothyroidism, unspecified hypothyroidism type - Plan: levothyroxine (SYNTHROID, LEVOTHROID) 75 MCG tablet  Need for Tdap vaccination - Plan: Tdap vaccine greater than or equal to 7yo IM  Plantar fasciitis of left foot   -We reviewed the PMH, PSH, FH, SH, Meds and Allergies. -We provided refills for any medications we will prescribe as needed. -We addressed current concerns per orders and patient instructions. -We have asked for records for pertinent exams, studies, vaccines and notes from previous providers. -We have advised patient to follow up per instructions below.   -Patient advised to return or notify a doctor immediately if symptoms worsen or persist or new concerns arise.  Patient Instructions  BEFORE YOU LEAVE: -schedule follow up in 3 months  Wear a [place] Sock at night  Get gait analysis and shoe recommendation from [employer]  We recommend the following healthy lifestyle measures: - eat a healthy diet consisting of lots of vegetables, fruits, beans, nuts, seeds, healthy meats such as white chicken and fish and whole grains.  - avoid fried foods, fast food, processed foods, sodas,  red meet and other fattening foods.  - get a least 150 minutes of aerobic exercise per week.       Colin Benton R.

## 2014-11-21 NOTE — Patient Instructions (Signed)
BEFORE YOU LEAVE: -schedule follow up in 3 months  Wear a [place] Sock at night  Get gait analysis and shoe recommendation from [employer]  We recommend the following healthy lifestyle measures: - eat a healthy diet consisting of lots of vegetables, fruits, beans, nuts, seeds, healthy meats such as white chicken and fish and whole grains.  - avoid fried foods, fast food, processed foods, sodas, red meet and other fattening foods.  - get a least 150 minutes of aerobic exercise per week.

## 2014-11-24 LAB — IPS OTHER TISSUE BIOPSY

## 2014-11-28 ENCOUNTER — Encounter: Payer: Self-pay | Admitting: Obstetrics and Gynecology

## 2014-11-28 ENCOUNTER — Ambulatory Visit: Payer: 59 | Admitting: Obstetrics and Gynecology

## 2014-11-28 ENCOUNTER — Ambulatory Visit (INDEPENDENT_AMBULATORY_CARE_PROVIDER_SITE_OTHER): Payer: 59 | Admitting: Obstetrics and Gynecology

## 2014-11-28 VITALS — BP 112/80 | HR 116 | Ht 60.0 in

## 2014-11-28 DIAGNOSIS — N920 Excessive and frequent menstruation with regular cycle: Secondary | ICD-10-CM

## 2014-11-28 DIAGNOSIS — D500 Iron deficiency anemia secondary to blood loss (chronic): Secondary | ICD-10-CM | POA: Diagnosis not present

## 2014-11-28 DIAGNOSIS — N3941 Urge incontinence: Secondary | ICD-10-CM | POA: Diagnosis not present

## 2014-11-28 NOTE — Progress Notes (Signed)
Patient ID: Maria [name], female   DOB: 18-Jan-1971, 44 y.o.   MRN: [id number] GYNECOLOGY  VISIT   HPI: 44 y.o.   Married  Caucasian  female   [id number] with Patient's last menstrual period was 11/14/2014 (exact date).   here for follow up PUS and discuss endometrial biopsy and heavy menstrual bleeding. Bleeding is heavy enough to affect ability to do activities. States she feels effects of her anemia, her arms feel heavy when she brushes her teeth.  Husband present as well today for discussion.  Pelvic ultrasound on 11/20/14 Pelvic ultrasound images and report reviewed in real time with patient and husband.  Uterus - no fibroids.  EMS 4.62 mm.  Ovaries normal.  No free fluid.   EMB - no endometrial tissue.  Pipelle was able to go in only 5 cm.   Birth control pills did not help bleeding in the past.   Hgb 10.5 on 10/20/14. Not taking iron currently.   Having incontinence during her menses.  No leak with cough or sneezing.  No leak with exercise or physical activity.  Leaks without warning.  Caffeine - a couple per day.   Status post Cesarean Section x 3 and BTL.  Declines future childbearing.   GYNECOLOGIC HISTORY: Patient's last menstrual period was 11/14/2014 (exact date). Contraception:   Tubal ligation Menopausal hormone therapy: N/A Last Pap:  ~10/2013, normal per pt, no history of abnormal paps Last MMG:  11/04/14, Bi-Rads 2:  Benign        OB History    Gravida Para Term Preterm AB TAB SAB Ectopic Multiple Living   3 3 3                 Patient Active Problem List   Diagnosis Date Noted  . Breast cyst 11/21/2014  . Menorrhagia 11/21/2014  . Anemia 11/21/2014  . Hypothyroidism 11/21/2014  . Obesity 11/21/2014  . Seasonal allergies 11/21/2014  . Asthma, chronic 11/21/2014  . Generalized anxiety disorder 11/21/2014  . Plantar fasciitis of left foot 11/21/2014    Past Medical History  Diagnosis Date  . Thyroid disease   . Asthma   . Generalized  anxiety disorder 11/21/2014  . Menorrhagia 11/21/2014    -seeing gyn    . Anemia 11/21/2014    -with elevated platelets   . Obesity 11/21/2014  . Breast cyst 11/21/2014    -L breast, upper L just off from center -stable per radiology 10/2014   . Seasonal allergies 11/21/2014  . Plantar fasciitis of left foot 11/21/2014    Past Surgical History  Procedure Laterality Date  . Cesarean section    . Tubal ligation      Current Outpatient Prescriptions  Medication Sig Dispense Refill  . albuterol (PROVENTIL HFA;VENTOLIN HFA) 108 (90 BASE) MCG/ACT inhaler Inhale 1 puff into the lungs every 6 (six) hours as needed for wheezing or shortness of breath. 3 Inhaler 0  . citalopram (CELEXA) 20 MG tablet Take 1 tablet (20 mg total) by mouth daily. 90 tablet 0  . Fluticasone-Salmeterol (ADVAIR) 250-50 MCG/DOSE AEPB Inhale 1 puff into the lungs daily as needed (for shortness of breath). 60 each 3  . levothyroxine (SYNTHROID, LEVOTHROID) 75 MCG tablet Take 1 tablet (75 mcg total) by mouth daily before breakfast. 90 tablet 0  . loratadine (CLARITIN) 10 MG tablet Take 1 tablet (10 mg total) by mouth daily. 90 tablet 0   No current facility-administered medications for this visit.     ALLERGIES: Review of patient's allergies indicates  no known allergies.  Family History  Problem Relation Age of Onset  . Breast cancer Mother   . Breast cancer Paternal Grandmother   . Cervical cancer Maternal Grandmother   . Arthritis Maternal Grandmother   . CVA Maternal Grandmother     History   Social History  . Marital Status: Married    Spouse Name: N/A  . Number of Children: N/A  . Years of Education: N/A   Occupational History  . Not on file.   Social History Main Topics  . Smoking status: Former Research scientist (life sciences)  . Smokeless tobacco: Never Used  . Alcohol Use: 0.6 oz/week    1 Glasses of wine per week     Comment: socially  . Drug Use: No  . Sexual Activity: Yes    Birth Control/ Protection: Surgical   Other  Topics Concern  . Not on file   Social History Narrative    ROS:  Pertinent items are noted in HPI.  PHYSICAL EXAMINATION:    BP 112/80 mmHg  Pulse 116  Ht 5' (1.524 m)  Wt   LMP 11/14/2014 (Exact Date)     General appearance: alert, cooperative and appears stated age                                                      ASSESSMENT  Menorrhagia and anemia.  Status post C/S x 3 and BTL. EMB unable to get inside endometrial canal due to stenosis of cervix.  I do not suspect the patient has hyperplasia or cancer.  Her endometrial lining is thin.  PLAN  Start iron therapy FeSO4 325 mg po q day. She will need follow up on CBC at least in one month.  Options discussed including risks and benefits of each. 1. Medical therapy for menorrhagia - oral contraceptives, Depo Provera, IUD (not likely to be successfully placed), Nexplanon 2. Conservative surgery - hysteroscopy with dilation and curettage and endometrial ablation, laparoscopy.   3. Robotic hysterectomy and bilateral salpingectomy.  Risks include but are not limited to bleeding, infection, damage to surrounding organs, reaction to anesthesia, pneumonia, DVT, PE, death, hernia formation, and need for reoperation.  Surgical expectations and recovery discussed. ACOG handout on hysterectomy and DVD on DaVinci robotic approach also given to patient.  Will precert hysterectomy/bilateral salpingectomy, and cystoscopy as the next step.   An After Visit Summary was printed and given to the patient.  _25____ minutes face to face time of which over 50% was spent in counseling.

## 2014-12-02 ENCOUNTER — Telehealth: Payer: Self-pay | Admitting: Obstetrics and Gynecology

## 2014-12-02 NOTE — Telephone Encounter (Signed)
Spoke with patient. Advised of benefit quote received for the surgeon portion of her surgery. Advised that payment is due in full at least 3 weeks prior to the scheduled surgery date. Patient agreeable. Advisedof patient that she will receive separate communication from the hospital regarding facility charges/fees/payment.  Advised that she will be hearing from [facility] regarding scheduling.

## 2014-12-04 NOTE — Telephone Encounter (Signed)
Call to patient to patient regarding scheduling. Left message to call back.

## 2014-12-11 NOTE — Telephone Encounter (Signed)
Follow-up call to patient, left message to call back. Calling patient to check on date preferences. Left message to call back.Maria [name]

## 2014-12-17 ENCOUNTER — Telehealth: Payer: Self-pay | Admitting: *Deleted

## 2014-12-17 NOTE — Telephone Encounter (Signed)
Called patient to remind her of appointment with Dr. Alen Blew tomorrow at 10:30 am.

## 2014-12-18 ENCOUNTER — Encounter: Payer: Self-pay | Admitting: Oncology

## 2014-12-18 ENCOUNTER — Ambulatory Visit (HOSPITAL_BASED_OUTPATIENT_CLINIC_OR_DEPARTMENT_OTHER): Payer: 59 | Admitting: Oncology

## 2014-12-18 ENCOUNTER — Ambulatory Visit: Payer: 59

## 2014-12-18 ENCOUNTER — Telehealth: Payer: Self-pay | Admitting: Oncology

## 2014-12-18 ENCOUNTER — Other Ambulatory Visit: Payer: 59

## 2014-12-18 VITALS — BP 121/68 | HR 73 | Temp 97.6°F | Resp 18 | Ht 60.0 in | Wt 212.9 lb

## 2014-12-18 DIAGNOSIS — N920 Excessive and frequent menstruation with regular cycle: Secondary | ICD-10-CM

## 2014-12-18 DIAGNOSIS — D509 Iron deficiency anemia, unspecified: Secondary | ICD-10-CM

## 2014-12-18 DIAGNOSIS — D473 Essential (hemorrhagic) thrombocythemia: Secondary | ICD-10-CM

## 2014-12-18 DIAGNOSIS — E039 Hypothyroidism, unspecified: Secondary | ICD-10-CM

## 2014-12-18 DIAGNOSIS — D500 Iron deficiency anemia secondary to blood loss (chronic): Secondary | ICD-10-CM

## 2014-12-18 DIAGNOSIS — N924 Excessive bleeding in the premenopausal period: Secondary | ICD-10-CM

## 2014-12-18 NOTE — Consult Note (Signed)
Reason for Referral: Iron deficiency anemia.   HPI: 44 year old woman native of [place] but have been living in this area since January 2016. She has history of hypothyroidism, asthma as well as menorrhagia. She has noted heavy menstrual bleeding for the last 3 years. She had been told that she has uterine fibroids. She was also diagnosed with iron deficiency anemia and was treated intermittently with iron supplements in 2014 and 2015. Her hemoglobin and iron levels corrected but have not taken her iron supplements recently. She was told at her iron over corrects and had iron overload. Most recently, she was noted to have increased fatigue and tiredness and was seen in the emergency department on 10/20/2014 for complaints of chest pain and shortness of breath. Her CBC at that time showed a hemoglobin of 10.5 and normal white cell count at the time with an MCV of 80. Her platelet count 484. Her TSH was normal 3.617. She was referred to me for evaluation regarding these findings. Clinically, she is still symptomatic. She does have reported fatigue, tiredness, muscle pain as well as other GI at times. She has started iron supplements in the last 2 weeks and have tolerated it well. She continues to have menorrhagia. She was seen by gynecology and have recommended to have a hysterectomy.  She does not report any headaches, blurry vision, syncope or seizures. He does not report any fevers or chills or sweats. She does not report any chest pain, palpitation, orthopnea she does report dyspnea on exertion. She does not report any cough or hemoptysis or wheezing. Does not report any nausea, vomiting, abdominal pain. I put any hematochezia or melena. She does not report any frequency urgency or hesitancy. She does not report any skeletal complaints. Remaining review of systems unremarkable.   Past Medical History  Diagnosis Date  . Thyroid disease   . Asthma   . Generalized anxiety disorder 11/21/2014  .  Menorrhagia 11/21/2014    -seeing gyn    . Anemia 11/21/2014    -with elevated platelets   . Obesity 11/21/2014  . Breast cyst 11/21/2014    -L breast, upper L just off from center -stable per radiology 10/2014   . Seasonal allergies 11/21/2014  . Plantar fasciitis of left foot 11/21/2014  :  Past Surgical History  Procedure Laterality Date  . Cesarean section    . Tubal ligation    :   Current outpatient prescriptions:  .  albuterol (PROVENTIL HFA;VENTOLIN HFA) 108 (90 BASE) MCG/ACT inhaler, Inhale 1 puff into the lungs every 6 (six) hours as needed for wheezing or shortness of breath., Disp: 3 Inhaler, Rfl: 0 .  citalopram (CELEXA) 20 MG tablet, Take 1 tablet (20 mg total) by mouth daily., Disp: 90 tablet, Rfl: 0 .  Fluticasone-Salmeterol (ADVAIR) 250-50 MCG/DOSE AEPB, Inhale 1 puff into the lungs daily as needed (for shortness of breath)., Disp: 60 each, Rfl: 3 .  levothyroxine (SYNTHROID, LEVOTHROID) 75 MCG tablet, Take 1 tablet (75 mcg total) by mouth daily before breakfast., Disp: 90 tablet, Rfl: 0 .  loratadine (CLARITIN) 10 MG tablet, Take 1 tablet (10 mg total) by mouth daily., Disp: 90 tablet, Rfl: 0:  No Known Allergies:  Family History  Problem Relation Age of Onset  . Breast cancer Mother   . Breast cancer Paternal Grandmother   . Cervical cancer Maternal Grandmother   . Arthritis Maternal Grandmother   . CVA Maternal Grandmother   :  History   Social History  . Marital  Status: Married    Spouse Name: N/A  . Number of Children: N/A  . Years of Education: N/A   Occupational History  . Not on file.   Social History Main Topics  . Smoking status: Former Research scientist (life sciences)  . Smokeless tobacco: Never Used  . Alcohol Use: 0.6 oz/week    1 Glasses of wine per week     Comment: socially  . Drug Use: No  . Sexual Activity: Yes    Birth Control/ Protection: Surgical   Other Topics Concern  . Not on file   Social History Narrative  :  Pertinent items are noted in  HPI.  Exam: ECOG 0 Blood pressure 121/68, pulse 73, temperature 97.6 F (36.4 C), temperature source Oral, resp. rate 18, height 5' (1.524 m), weight 212 lb 14.4 oz (96.571 kg), last menstrual period 11/14/2014, SpO2 100 %. General appearance: alert and cooperative Head: Normocephalic, without obvious abnormality Throat: lips, mucosa, and tongue normal; teeth and gums normal Neck: no adenopathy Back: negative Resp: clear to auscultation bilaterally Chest wall: no tenderness Cardio: regular rate and rhythm, S1, S2 normal, no murmur, click, rub or gallop GI: soft, non-tender; bowel sounds normal; no masses,  no organomegaly Extremities: extremities normal, atraumatic, no cyanosis or edema Pulses: 2+ and symmetric Skin: Skin color, texture, turgor normal. No rashes or lesions   CBC    Component Value Date/Time   WBC 7.4 10/20/2014 0847   RBC 4.14 10/20/2014 0847   HGB 11.6* 10/20/2014 0858   HCT 34.0* 10/20/2014 0858   PLT 484* 10/20/2014 0847   MCV 80.0 10/20/2014 0847   MCH 25.4* 10/20/2014 0847   MCHC 31.7 10/20/2014 0847   RDW 15.5 10/20/2014 0847   LYMPHSABS 1.7 10/20/2014 0847   MONOABS 0.6 10/20/2014 0847   EOSABS 0.3 10/20/2014 0847   BASOSABS 0.0 10/20/2014 0847      Assessment and Plan:   44 year old woman with the following issues:  1. Iron deficiency anemia likely related due to menorrhagia. This been diagnosed since 2014 as documented by her CBC, iron levels obtained by her primary care physician while she was living in [place]. His laboratory data were personally reviewed today with the patient extensively. Her hemoglobin have ranged as low as 10.5 as high as 12. Her ferritin was documented as low as 7 previously and responded to oral iron supplements. It is certainly reasonable to assume that her iron deficiency is related to menorrhagia.  From a management standpoint I recommended continue oral supplements once a day for the time being and repeat iron  studies in about 2 months. I do not feel her overall compensation overcorrection and her iron levels. If she cannot tolerate iron oral replacement we will consider IV iron which was discussed with her today.  The duration of iron supplements might be indefinite to her menorrhagia has been corrected.  2. Menorrhagia: She follows up with gynecology regarding this issue. She might need a hysterectomy for definitively correcting her iron deficiency.  3. Thrombocytosis: This is reactive related to iron deficiency and not a sign of a hematological disorder.  4. Fatigue, tiredness and muscle aches: Iron deficiency anemia is likely contributing to her symptoms but could be also sign of other condition. These could be related to hypothyroidism or other autoimmune disorder. The first up is to correct her iron deficiency and see that helps her symptoms. She might need a rheumatological workup if the symptoms persist.

## 2014-12-18 NOTE — Telephone Encounter (Signed)
per pof ot sch pt appt-gave pt copy of sch °

## 2014-12-18 NOTE — Progress Notes (Signed)
Checked in new pt with no financial concerns at this time.  Pt has my card for any billing questions or concerns. °

## 2014-12-18 NOTE — Progress Notes (Signed)
Please see consult note.  

## 2014-12-23 NOTE — Telephone Encounter (Signed)
Patient states she is returning your call

## 2014-12-24 NOTE — Telephone Encounter (Signed)
Spoke to patient regarding date options and scheduling policies. Patient agreeable. Thinks she will want to wait until September to schedule. She will call back when sure of date preference.  Routing to provider for final review. Patient agreeable to disposition. Patient aware provider will review message and nurse will return call with any additional instructions or change of disposition. Will close encounter.

## 2015-02-19 ENCOUNTER — Telehealth: Payer: Self-pay | Admitting: Oncology

## 2015-02-19 ENCOUNTER — Ambulatory Visit (HOSPITAL_BASED_OUTPATIENT_CLINIC_OR_DEPARTMENT_OTHER): Payer: 59 | Admitting: Oncology

## 2015-02-19 ENCOUNTER — Other Ambulatory Visit (HOSPITAL_BASED_OUTPATIENT_CLINIC_OR_DEPARTMENT_OTHER): Payer: 59

## 2015-02-19 VITALS — BP 112/47 | HR 77 | Temp 97.8°F | Resp 18 | Ht 60.0 in | Wt 211.1 lb

## 2015-02-19 DIAGNOSIS — N920 Excessive and frequent menstruation with regular cycle: Secondary | ICD-10-CM

## 2015-02-19 DIAGNOSIS — M791 Myalgia: Secondary | ICD-10-CM | POA: Diagnosis not present

## 2015-02-19 DIAGNOSIS — N924 Excessive bleeding in the premenopausal period: Secondary | ICD-10-CM

## 2015-02-19 DIAGNOSIS — D509 Iron deficiency anemia, unspecified: Secondary | ICD-10-CM

## 2015-02-19 DIAGNOSIS — D500 Iron deficiency anemia secondary to blood loss (chronic): Secondary | ICD-10-CM

## 2015-02-19 DIAGNOSIS — M797 Fibromyalgia: Secondary | ICD-10-CM

## 2015-02-19 DIAGNOSIS — D473 Essential (hemorrhagic) thrombocythemia: Secondary | ICD-10-CM

## 2015-02-19 DIAGNOSIS — R5383 Other fatigue: Secondary | ICD-10-CM

## 2015-02-19 LAB — IRON AND TIBC CHCC
%SAT: 14 % — AB (ref 21–57)
Iron: 50 ug/dL (ref 41–142)
TIBC: 359 ug/dL (ref 236–444)
UIBC: 309 ug/dL (ref 120–384)

## 2015-02-19 LAB — CBC WITH DIFFERENTIAL/PLATELET
BASO%: 0.9 % (ref 0.0–2.0)
Basophils Absolute: 0.1 10*3/uL (ref 0.0–0.1)
EOS%: 1.9 % (ref 0.0–7.0)
Eosinophils Absolute: 0.2 10*3/uL (ref 0.0–0.5)
HCT: 38.3 % (ref 34.8–46.6)
HGB: 12.8 g/dL (ref 11.6–15.9)
LYMPH#: 2 10*3/uL (ref 0.9–3.3)
LYMPH%: 21.6 % (ref 14.0–49.7)
MCH: 27.9 pg (ref 25.1–34.0)
MCHC: 33.4 g/dL (ref 31.5–36.0)
MCV: 83.6 fL (ref 79.5–101.0)
MONO#: 0.7 10*3/uL (ref 0.1–0.9)
MONO%: 7 % (ref 0.0–14.0)
NEUT#: 6.4 10*3/uL (ref 1.5–6.5)
NEUT%: 68.6 % (ref 38.4–76.8)
Platelets: 426 10*3/uL — ABNORMAL HIGH (ref 145–400)
RBC: 4.58 10*6/uL (ref 3.70–5.45)
RDW: 17 % — ABNORMAL HIGH (ref 11.2–14.5)
WBC: 9.4 10*3/uL (ref 3.9–10.3)

## 2015-02-19 LAB — COMPREHENSIVE METABOLIC PANEL (CC13)
ALT: 14 U/L (ref 0–55)
AST: 13 U/L (ref 5–34)
Albumin: 3.5 g/dL (ref 3.5–5.0)
Alkaline Phosphatase: 84 U/L (ref 40–150)
Anion Gap: 10 mEq/L (ref 3–11)
BUN: 11.5 mg/dL (ref 7.0–26.0)
CALCIUM: 9.6 mg/dL (ref 8.4–10.4)
CO2: 23 mEq/L (ref 22–29)
CREATININE: 0.8 mg/dL (ref 0.6–1.1)
Chloride: 106 mEq/L (ref 98–109)
EGFR: 85 mL/min/{1.73_m2} — ABNORMAL LOW (ref >90)
GLUCOSE: 96 mg/dL (ref 70–140)
POTASSIUM: 4 meq/L (ref 3.5–5.1)
Sodium: 139 mEq/L (ref 136–145)
TOTAL PROTEIN: 6.9 g/dL (ref 6.4–8.3)
Total Bilirubin: 0.23 mg/dL (ref 0.20–1.20)

## 2015-02-19 LAB — FERRITIN CHCC: Ferritin: 27 ng/ml (ref 9–269)

## 2015-02-19 NOTE — Progress Notes (Signed)
Hematology and Oncology Follow Up Visit  Maria [name] [id number] 28-May-1971 44 y.o. 02/19/2015 9:43 AM KIM, Nickola Major., Teodoro Spray, DO   Principle Diagnosis: 44 year old woman with iron deficiency anemia related to a to menorrhagia. This is documented in 2014 while she was living in [place]. She was found to have uterine fibroids contributing to her menorrhagia.   Current therapy: Oral iron supplements taking daily for last 4 months.  Interim History:  Maria [name] is as today for a follow-up visit. Since the last time I saw her, she continued on oral iron once a day without any complications. She did not report any dyspepsia, constipation, diarrhea or nausea. She did not notice any difference in her symptoms. She continues to have constellation of vague symptoms that include muscle pain, severe fatigue and exhaustion. She has not reported any hematochezia or melanoma. She continues to have heavy menstrual cycles periodically.  She does not report any headaches, blurry vision, syncope or seizures. He does not report any fevers or chills or sweats. She does not report any chest pain, palpitation, orthopnea she does report dyspnea on exertion. She does not report any cough or hemoptysis or wheezing. Does not report any nausea, vomiting, abdominal pain. I put any hematochezia or melena. She does not report any frequency urgency or hesitancy. She does not report any skeletal complaints. Remaining review of systems unremarkable.  Medications: I have reviewed the patient's current medications.  Current Outpatient Prescriptions  Medication Sig Dispense Refill  . albuterol (PROVENTIL HFA;VENTOLIN HFA) 108 (90 BASE) MCG/ACT inhaler Inhale 1 puff into the lungs every 6 (six) hours as needed for wheezing or shortness of breath. 3 Inhaler 0  . citalopram (CELEXA) 20 MG tablet Take 1 tablet (20 mg total) by mouth daily. 90 tablet 0  . Fluticasone-Salmeterol (ADVAIR) 250-50 MCG/DOSE AEPB Inhale 1 puff  into the lungs daily as needed (for shortness of breath). 60 each 3  . levothyroxine (SYNTHROID, LEVOTHROID) 75 MCG tablet Take 1 tablet (75 mcg total) by mouth daily before breakfast. 90 tablet 0  . loratadine (CLARITIN) 10 MG tablet Take 1 tablet (10 mg total) by mouth daily. 90 tablet 0   No current facility-administered medications for this visit.     Allergies: No Known Allergies  Past Medical History, Surgical history, Social history, and Family History were reviewed and updated.  Physical Exam: Blood pressure 112/47, pulse 77, temperature 97.8 F (36.6 C), temperature source Oral, resp. rate 18, height 5' (1.524 m), weight 211 lb 1.6 oz (95.754 kg), SpO2 100 %. ECOG: 0 General appearance: alert and cooperative Head: Normocephalic, without obvious abnormality Neck: no adenopathy Lymph nodes: Cervical, supraclavicular, and axillary nodes normal. Heart:regular rate and rhythm, S1, S2 normal, no murmur, click, rub or gallop Lung:chest clear, no wheezing, rales, normal symmetric air entry Abdomin: soft, non-tender, without masses or organomegaly EXT:no erythema, induration, or nodules   Lab Results: Lab Results  Component Value Date   WBC 9.4 02/19/2015   HGB 12.8 02/19/2015   HCT 38.3 02/19/2015   MCV 83.6 02/19/2015   PLT 426* 02/19/2015     Chemistry      Component Value Date/Time   NA 140 10/20/2014 0858   K 3.9 10/20/2014 0858   CL 105 10/20/2014 0858   BUN 11 10/20/2014 0858   CREATININE 0.80 10/20/2014 0858   No results found for: CALCIUM, ALKPHOS, AST, ALT, BILITOT     Impression and Plan:  44 year old woman with the following issues:  1. Iron  deficiency anemia related to menorrhagia currently on oral iron supplements. She is taken iron 325 mg daily and her hemoglobin is back to normal. Studies are currently pending but she have responded nicely to once a day aren't supplements. Her hemoglobin improved from 10.5 to 12.8 and the last 4 months with once a  day iron.  Plan is to continue on the same dose and schedule and would recommend increasing her iron if iron stores do not respond appropriately.  2. Menorrhagia: Related to uterine fibroids. She is following up with gynecology regarding this issue and considering hysterectomy. I explained to her as long as she is having heavy menses, she will continue develop iron deficiency and might require IV iron if they become severe enough. A definitive intervention such as a hysterectomy might prevent that.  3. Thrombocytosis: Its improving which is a sign of improving iron deficiency anemia.  4. Muscle aches and excessive fatigue: I do not think this is related to a blood disorder especially with her anemia correcting. I recommended a rheumatology evaluation for possible autoimmune disorder every fibromyalgia. She asked for referral today and I made a referral for her based on her request.  5. Follow-up: Will be in 4 months sooner if needed to.   [facility], MD 7/7/20169:43 AM

## 2015-02-19 NOTE — Telephone Encounter (Signed)
per pof to sch pt appt-gave pt copy of avs-pt stated will call rheumatolotogist hersel

## 2015-02-23 ENCOUNTER — Encounter: Payer: Self-pay | Admitting: Family Medicine

## 2015-02-23 ENCOUNTER — Ambulatory Visit (INDEPENDENT_AMBULATORY_CARE_PROVIDER_SITE_OTHER): Payer: 59 | Admitting: Family Medicine

## 2015-02-23 VITALS — BP 100/76 | HR 86 | Temp 98.1°F | Ht 60.0 in | Wt 212.7 lb

## 2015-02-23 DIAGNOSIS — D500 Iron deficiency anemia secondary to blood loss (chronic): Secondary | ICD-10-CM | POA: Diagnosis not present

## 2015-02-23 DIAGNOSIS — R5382 Chronic fatigue, unspecified: Secondary | ICD-10-CM

## 2015-02-23 DIAGNOSIS — E038 Other specified hypothyroidism: Secondary | ICD-10-CM

## 2015-02-23 DIAGNOSIS — Z13220 Encounter for screening for lipoid disorders: Secondary | ICD-10-CM

## 2015-02-23 DIAGNOSIS — M791 Myalgia, unspecified site: Secondary | ICD-10-CM

## 2015-02-23 DIAGNOSIS — F39 Unspecified mood [affective] disorder: Secondary | ICD-10-CM

## 2015-02-23 DIAGNOSIS — E669 Obesity, unspecified: Secondary | ICD-10-CM | POA: Diagnosis not present

## 2015-02-23 DIAGNOSIS — E039 Hypothyroidism, unspecified: Secondary | ICD-10-CM

## 2015-02-23 LAB — LIPID PANEL
CHOL/HDL RATIO: 3
Cholesterol: 192 mg/dL (ref 0–200)
HDL: 64.4 mg/dL (ref >39.00)
LDL CALC: 97 mg/dL (ref 0–99)
NONHDL: 127.6
Triglycerides: 151 mg/dL — ABNORMAL HIGH (ref 0.0–149.0)
VLDL: 30.2 mg/dL (ref 0.0–40.0)

## 2015-02-23 LAB — TSH: TSH: 3.34 u[IU]/mL (ref 0.35–4.50)

## 2015-02-23 MED ORDER — CITALOPRAM HYDROBROMIDE 20 MG PO TABS: 20.0000 mg | ORAL_TABLET | Freq: Every day | ORAL | Status: DC

## 2015-02-23 MED ORDER — FLUTICASONE-SALMETEROL 250-50 MCG/DOSE IN AEPB: 1.0000 | INHALATION_SPRAY | Freq: Every day | RESPIRATORY_TRACT | Status: DC | PRN

## 2015-02-23 MED ORDER — ALBUTEROL SULFATE HFA 108 (90 BASE) MCG/ACT IN AERS: 1.0000 | INHALATION_SPRAY | Freq: Four times a day (QID) | RESPIRATORY_TRACT | Status: DC | PRN

## 2015-02-23 NOTE — Patient Instructions (Signed)
BEFORE YOU LEAVE: -labs -schedule follow up up in 4 months  We recommend the following healthy lifestyle measures: - eat a healthy diet consisting of lots of vegetables, fruits, beans, nuts, seeds, healthy meats such as white chicken and fish and whole grains.  - avoid fried foods, fast food, processed foods, sodas, red meet and other fattening foods.  - get a least 150 minutes of aerobic exercise per week.   -We placed a referral for you as discussed to the rheumatologist. It usually takes about 1-2 weeks to process and schedule this referral. If you have not heard from [name] regarding this appointment in 2 weeks please contact our office.  -We have ordered labs or studies at this visit. It can take up to 1-2 weeks for results and processing. We will contact you with instructions IF your results are abnormal. Normal results will be released to your [facility]. If you have not heard from [name] or can not find your results in [facility] in 2 weeks please contact our office.

## 2015-02-23 NOTE — Progress Notes (Signed)
Pre visit review using our clinic review tool, if applicable. No additional management support is needed unless otherwise documented below in the visit note. 

## 2015-02-23 NOTE — Progress Notes (Signed)
HPI:   Menorrhagia -chronic heavy menstrual bleeding -pt seeing gyn, had TVUS, EMB -denies palpitations, syncope, dizziness  Anemia and elevated platelets: -likely from her heavy bleeding -seeing onc per her preference as she thought she could not take oral iron -on oral iron and responded well -denies: palpitations, dizziness  Obesity: -diet and exercise: she is trying to work on diet, trying to walk -tendinitis L foot/plantar fasciitis -reports this interferes with ability to exercise -denies: weakness, numbness, trauma  Hypothyroid: -started: dx 1 year ago -denies: hx ca or goiter -meds: synthroid 75 mcg  Depression and anxiety (mostly anxiety)/muscle pain: -depression and anxiety ok -chronic, pain all over in muscles for a long time, fatigue - wants referral to rheumatology, reports onc told her needed referral from [name] even though they sent referral and advised it -thought would feel better after treating anemia and depression, but not much better -meds: celexa -started: in 2013 -symptoms: mostly anxiety -denies: SI, panic, manic symptoms  Seasonal allergies/mod persistent asthma: -started: started in her adult life -treatments: advair daily, alb prn -worse with: spring and fall, was allergy tested remotely -meds: claritin -denies: wheezing, SOB  ROS: See pertinent positives and negatives per HPI.  Past Medical History  Diagnosis Date  . Thyroid disease   . Asthma   . Generalized anxiety disorder 11/21/2014  . Menorrhagia 11/21/2014    -seeing gyn    . Anemia 11/21/2014    -with elevated platelets   . Obesity 11/21/2014  . Breast cyst 11/21/2014    -L breast, upper L just off from center -stable per radiology 10/2014   . Seasonal allergies 11/21/2014  . Plantar fasciitis of left foot 11/21/2014    Past Surgical History  Procedure Laterality Date  . Cesarean section    . Tubal ligation      Family History  Problem Relation Age of Onset  . Breast cancer Mother    . Breast cancer Paternal Grandmother   . Cervical cancer Maternal Grandmother   . Arthritis Maternal Grandmother   . CVA Maternal Grandmother     History   Social History  . Marital Status: Married    Spouse Name: N/A  . Number of Children: N/A  . Years of Education: N/A   Social History Main Topics  . Smoking status: Former Research scientist (life sciences)  . Smokeless tobacco: Never Used  . Alcohol Use: 0.6 oz/week    1 Glasses of wine per week     Comment: socially  . Drug Use: No  . Sexual Activity: Yes    Birth Control/ Protection: Surgical   Other Topics Concern  . None   Social History Narrative     Current outpatient prescriptions:  .  albuterol (PROVENTIL HFA;VENTOLIN HFA) 108 (90 BASE) MCG/ACT inhaler, Inhale 1 puff into the lungs every 6 (six) hours as needed for wheezing or shortness of breath., Disp: 3 Inhaler, Rfl: 0 .  citalopram (CELEXA) 20 MG tablet, Take 1 tablet (20 mg total) by mouth daily., Disp: 90 tablet, Rfl: 3 .  Fluticasone-Salmeterol (ADVAIR) 250-50 MCG/DOSE AEPB, Inhale 1 puff into the lungs daily as needed (for shortness of breath)., Disp: 60 each, Rfl: 3 .  levothyroxine (SYNTHROID, LEVOTHROID) 75 MCG tablet, Take 1 tablet (75 mcg total) by mouth daily before breakfast., Disp: 90 tablet, Rfl: 0 .  loratadine (CLARITIN) 10 MG tablet, Take 1 tablet (10 mg total) by mouth daily., Disp: 90 tablet, Rfl: 0  EXAM:  Filed Vitals:   02/23/15 0920  BP: 100/76  Pulse:  86  Temp: 98.1 F (36.7 C)    Body mass index is 41.54 kg/(m^2).  GENERAL: vitals reviewed and listed above, alert, oriented, appears well hydrated and in no acute distress  HEENT: atraumatic, conjunttiva clear, no obvious abnormalities on inspection of external nose and ears  NECK: no obvious masses on inspection  LUNGS: clear to auscultation bilaterally, no wheezes, rales or rhonchi, good air movement  CV: HRRR, no peripheral edema  MS: moves all extremities without noticeable  abnormality  PSYCH: pleasant and cooperative, no obvious depression or anxiety  ASSESSMENT AND PLAN:  Discussed the following assessment and plan:  Other specified hypothyroidism - Plan: TSH  Iron deficiency anemia due to chronic blood loss  Obesity  Muscle pain - Plan: Ambulatory referral to Rheumatology  Chronic fatigue - Plan: Ambulatory referral to Rheumatology  Mood disorder  Screening cholesterol level - Plan: Lipid Panel  Hypothyroidism, unspecified hypothyroidism type  -labs -discussed dx, implications and tx for fibro and she wants referral to rheu for this - reports onc office did not process this referral from her hematologist and requested we place this order -healthy diet and regular exercise advised -Patient advised to return or notify a doctor immediately if symptoms worsen or persist or new concerns arise.  Patient Instructions  BEFORE YOU LEAVE: -labs -schedule follow up up in 4 months  We recommend the following healthy lifestyle measures: - eat a healthy diet consisting of lots of vegetables, fruits, beans, nuts, seeds, healthy meats such as white chicken and fish and whole grains.  - avoid fried foods, fast food, processed foods, sodas, red meet and other fattening foods.  - get a least 150 minutes of aerobic exercise per week.   -We placed a referral for you as discussed to the rheumatologist. It usually takes about 1-2 weeks to process and schedule this referral. If you have not heard from [name] regarding this appointment in 2 weeks please contact our office.  -We have ordered labs or studies at this visit. It can take up to 1-2 weeks for results and processing. We will contact you with instructions IF your results are abnormal. Normal results will be released to your [facility]. If you have not heard from [name] or can not find your results in [facility] in 2 weeks please contact our office.             Colin Benton R.

## 2015-02-24 ENCOUNTER — Other Ambulatory Visit: Payer: Self-pay | Admitting: *Deleted

## 2015-02-24 DIAGNOSIS — E039 Hypothyroidism, unspecified: Secondary | ICD-10-CM

## 2015-02-24 MED ORDER — LEVOTHYROXINE SODIUM 75 MCG PO TABS: 75.0000 ug | ORAL_TABLET | Freq: Every day | ORAL | Status: DC

## 2015-02-24 NOTE — Telephone Encounter (Signed)
Rx done. 

## 2015-04-02 ENCOUNTER — Encounter: Payer: Self-pay | Admitting: Obstetrics and Gynecology

## 2015-04-02 ENCOUNTER — Telehealth: Payer: Self-pay | Admitting: *Deleted

## 2015-04-02 NOTE — Telephone Encounter (Signed)
See previous phone encounter. Patient planned to proceed with surgery in September. Follow-up call to patient cell number. Left message to call back. Call to home number, female states this is husband's cell number, he instructed me to call her cell.

## 2015-04-07 ENCOUNTER — Encounter: Payer: Self-pay | Admitting: Family Medicine

## 2015-04-07 ENCOUNTER — Ambulatory Visit (INDEPENDENT_AMBULATORY_CARE_PROVIDER_SITE_OTHER): Payer: 59 | Admitting: Family Medicine

## 2015-04-07 VITALS — BP 100/82 | HR 79 | Temp 98.4°F | Ht 60.0 in

## 2015-04-07 DIAGNOSIS — L0292 Furuncle, unspecified: Secondary | ICD-10-CM

## 2015-04-07 DIAGNOSIS — Z803 Family history of malignant neoplasm of breast: Secondary | ICD-10-CM

## 2015-04-07 DIAGNOSIS — I809 Phlebitis and thrombophlebitis of unspecified site: Secondary | ICD-10-CM

## 2015-04-07 MED ORDER — NAPROXEN 500 MG PO TABS: 500.0000 mg | ORAL_TABLET | Freq: Two times a day (BID) | ORAL | Status: DC

## 2015-04-07 MED ORDER — DOXYCYCLINE HYCLATE 100 MG PO CAPS: 100.0000 mg | ORAL_CAPSULE | Freq: Two times a day (BID) | ORAL | Status: DC

## 2015-04-07 NOTE — Progress Notes (Addendum)
HPI:  Acute visit for:  Infected boil: -started a few weeks ago as a pimple under her R breast - she stuck a needle in it and messed with and it has taken some time to heal - this is improving finally, but she developed a painful vein in this region a few weeks ago -denies: fevers, pus, drainage, malaise -very anxious about the vein   ROS: See pertinent positives and negatives per HPI.  Past Medical History  Diagnosis Date  . Thyroid disease   . Asthma   . Generalized anxiety disorder 11/21/2014  . Menorrhagia 11/21/2014    -seeing gyn    . Anemia 11/21/2014    -with elevated platelets   . Obesity 11/21/2014  . Breast cyst 11/21/2014    -L breast, upper L just off from center -stable per radiology 10/2014   . Seasonal allergies 11/21/2014  . Plantar fasciitis of left foot 11/21/2014    Past Surgical History  Procedure Laterality Date  . Cesarean section    . Tubal ligation      Family History  Problem Relation Age of Onset  . Breast cancer Mother   . Breast cancer Paternal Grandmother   . Cervical cancer Maternal Grandmother   . Arthritis Maternal Grandmother   . CVA Maternal Grandmother     Social History   Social History  . Marital Status: Married    Spouse Name: N/A  . Number of Children: N/A  . Years of Education: N/A   Social History Main Topics  . Smoking status: Former Research scientist (life sciences)  . Smokeless tobacco: Never Used  . Alcohol Use: 0.6 oz/week    1 Glasses of wine per week     Comment: socially  . Drug Use: No  . Sexual Activity: Yes    Birth Control/ Protection: Surgical   Other Topics Concern  . None   Social History Narrative     Current outpatient prescriptions:  .  albuterol (PROVENTIL HFA;VENTOLIN HFA) 108 (90 BASE) MCG/ACT inhaler, Inhale 1 puff into the lungs every 6 (six) hours as needed for wheezing or shortness of breath., Disp: 3 Inhaler, Rfl: 0 .  citalopram (CELEXA) 20 MG tablet, Take 1 tablet (20 mg total) by mouth daily., Disp: 90 tablet, Rfl:  3 .  Fluticasone-Salmeterol (ADVAIR) 250-50 MCG/DOSE AEPB, Inhale 1 puff into the lungs daily as needed (for shortness of breath)., Disp: 60 each, Rfl: 3 .  levothyroxine (SYNTHROID, LEVOTHROID) 75 MCG tablet, Take 1 tablet (75 mcg total) by mouth daily before breakfast., Disp: 90 tablet, Rfl: 0 .  loratadine (CLARITIN) 10 MG tablet, Take 1 tablet (10 mg total) by mouth daily., Disp: 90 tablet, Rfl: 0 .  doxycycline (VIBRAMYCIN) 100 MG capsule, Take 1 capsule (100 mg total) by mouth 2 (two) times daily., Disp: 20 capsule, Rfl: 0 .  naproxen (NAPROSYN) 500 MG tablet, Take 1 tablet (500 mg total) by mouth 2 (two) times daily with a meal., Disp: 30 tablet, Rfl: 0  EXAM:  Filed Vitals:   04/07/15 1513  BP: 100/82  Pulse: 79  Temp: 98.4 F (36.9 C)    There is no weight on file to calculate BMI.  GENERAL: vitals reviewed and listed above, alert, oriented, appears well hydrated and in no acute distress  HEENT: atraumatic, conjunttiva clear, no obvious abnormalities on inspection of external nose and ears  NECK: no obvious masses on inspection  LUNGS: clear to auscultation bilaterally, no wheezes, rales or rhonchi, good air movement  CV: HRRR, no peripheral  edema  SKIN: small healing papule below R breast with minimal surround erythema, palpable tender subcutaneous cord in region of thoracoepigastric vein, no overlying redness or warmth - no skin findings findings on breast of mass  MS: moves all extremities without noticeable abnormality  PSYCH: pleasant and cooperative, no obvious depression or anxiety  ASSESSMENT AND PLAN:  Discussed the following assessment and plan:  Boil - Plan: US Abdomen Limited, doxycycline (VIBRAMYCIN) 100 MG capsule  Phlebitis - Plan: US Abdomen Limited, naproxen (NAPROSYN) 500 MG tablet, MM Digital Diagnostic Unilat R  FH: breast cancer - Plan: MM Digital Diagnostic Unilat R  -? 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), likely related to  inflammation/trauma from boil/needling over this area and bra compression, from what I can find in the liturature this is usually self limited and has not been found to precede dx of breast cancer, no signs or symptoms to suggest systemic infection/illness and no sig warmth or redness over vein, discussed options and decided to try abx and NSAIDs and heat -with FH discussed getting another diagnostic mammo; no abnormality of breast found o/w and had neg diag mammo earlier this year, but she is concerned about this - after further discussion she opted to do this -close follow up -Patient advised to return or notify a doctor immediately if symptoms worsen or persist or new concerns arise.  Patient Instructions  Follow up in 1 week  Start the doxycycline twice daily - do not get in the sun on this medication  Naproxen twice dialy  -We placed a referral for you as discussed for the ultrasoun. It usually takes about 1-2 weeks to process and schedule this referral. If you have not heard from [name] regarding this appointment in 2 weeks please contact our office.      Colin Benton R.

## 2015-04-07 NOTE — Patient Instructions (Signed)
Follow up in 1 week  Start the doxycycline twice daily - do not get in the sun on this medication  Naproxen twice dialy  -We placed a referral for you as discussed for the ultrasoun. It usually takes about 1-2 weeks to process and schedule this referral. If you have not heard from [name] regarding this appointment in 2 weeks please contact our office.

## 2015-04-07 NOTE — Progress Notes (Signed)
Pre visit review using our clinic review tool, if applicable. No additional management support is needed unless otherwise documented below in the visit note. 

## 2015-04-07 NOTE — Telephone Encounter (Signed)
Call to patient cell number. Left message to call back with update.

## 2015-04-08 ENCOUNTER — Other Ambulatory Visit: Payer: 59

## 2015-04-08 NOTE — Addendum Note (Signed)
Addended by: Lucretia Kern on: 04/08/2015 08:52 AM   Modules accepted: Orders

## 2015-04-10 NOTE — Telephone Encounter (Signed)
Call to patient. Left message to call back.  

## 2015-04-13 NOTE — Telephone Encounter (Signed)
No response from patient. Any additional follow-up? Please advise.

## 2015-04-13 NOTE — Telephone Encounter (Signed)
No further follow up at this time.  Patient will call back when she is ready.  Encounter closed.

## 2015-04-14 ENCOUNTER — Ambulatory Visit: Payer: 59 | Admitting: Family Medicine

## 2015-04-17 ENCOUNTER — Ambulatory Visit
Admission: RE | Admit: 2015-04-17 | Discharge: 2015-04-17 | Disposition: A | Discharge status: Home / Self Care | Payer: 59 | Source: Ambulatory Visit | Attending: Family Medicine | Admitting: Family Medicine

## 2015-04-17 ENCOUNTER — Ambulatory Visit (INDEPENDENT_AMBULATORY_CARE_PROVIDER_SITE_OTHER): Payer: 59 | Admitting: Family Medicine

## 2015-04-17 ENCOUNTER — Other Ambulatory Visit: Payer: Self-pay | Admitting: Family Medicine

## 2015-04-17 ENCOUNTER — Encounter: Payer: Self-pay | Admitting: Family Medicine

## 2015-04-17 VITALS — BP 108/78 | HR 85 | Temp 97.8°F | Ht 60.0 in

## 2015-04-17 DIAGNOSIS — I809 Phlebitis and thrombophlebitis of unspecified site: Secondary | ICD-10-CM

## 2015-04-17 DIAGNOSIS — Z803 Family history of malignant neoplasm of breast: Secondary | ICD-10-CM

## 2015-04-17 DIAGNOSIS — L989 Disorder of the skin and subcutaneous tissue, unspecified: Secondary | ICD-10-CM

## 2015-04-17 NOTE — Progress Notes (Signed)
HPI:  Mondor's disease/Phlebitis thoracoepigastric vein: -improving, pain sig decreased -denies: swelling, redness, fevers, malaise, breast lump -has not heard regarding mammo  Non-healing skin lesion: -still not healed, in area of phlebitis -denies: lesions elsewhere, malaise, fevers  ROS: See pertinent positives and negatives per HPI.  Past Medical History  Diagnosis Date  . Thyroid disease   . Asthma   . Generalized anxiety disorder 11/21/2014  . Menorrhagia 11/21/2014    -seeing gyn    . Anemia 11/21/2014    -with elevated platelets   . Obesity 11/21/2014  . Breast cyst 11/21/2014    -L breast, upper L just off from center -stable per radiology 10/2014   . Seasonal allergies 11/21/2014  . Plantar fasciitis of left foot 11/21/2014    Past Surgical History  Procedure Laterality Date  . Cesarean section    . Tubal ligation      Family History  Problem Relation Age of Onset  . Breast cancer Mother   . Breast cancer Paternal Grandmother   . Cervical cancer Maternal Grandmother   . Arthritis Maternal Grandmother   . CVA Maternal Grandmother     Social History   Social History  . Marital Status: Married    Spouse Name: N/A  . Number of Children: N/A  . Years of Education: N/A   Social History Main Topics  . Smoking status: Former Research scientist (life sciences)  . Smokeless tobacco: Never Used  . Alcohol Use: 0.6 oz/week    1 Glasses of wine per week     Comment: socially  . Drug Use: No  . Sexual Activity: Yes    Birth Control/ Protection: Surgical   Other Topics Concern  . None   Social History Narrative     Current outpatient prescriptions:  .  albuterol (PROVENTIL HFA;VENTOLIN HFA) 108 (90 BASE) MCG/ACT inhaler, Inhale 1 puff into the lungs every 6 (six) hours as needed for wheezing or shortness of breath., Disp: 3 Inhaler, Rfl: 0 .  citalopram (CELEXA) 20 MG tablet, Take 1 tablet (20 mg total) by mouth daily., Disp: 90 tablet, Rfl: 3 .  doxycycline (VIBRAMYCIN) 100 MG capsule,  Take 1 capsule (100 mg total) by mouth 2 (two) times daily., Disp: 20 capsule, Rfl: 0 .  Fluticasone-Salmeterol (ADVAIR) 250-50 MCG/DOSE AEPB, Inhale 1 puff into the lungs daily as needed (for shortness of breath)., Disp: 60 each, Rfl: 3 .  levothyroxine (SYNTHROID, LEVOTHROID) 75 MCG tablet, Take 1 tablet (75 mcg total) by mouth daily before breakfast., Disp: 90 tablet, Rfl: 0 .  loratadine (CLARITIN) 10 MG tablet, Take 1 tablet (10 mg total) by mouth daily., Disp: 90 tablet, Rfl: 0 .  naproxen (NAPROSYN) 500 MG tablet, Take 1 tablet (500 mg total) by mouth 2 (two) times daily with a meal., Disp: 30 tablet, Rfl: 0  EXAM:  Filed Vitals:   04/17/15 1340  BP: 108/78  Pulse: 85  Temp: 97.8 F (36.6 C)    There is no weight on file to calculate BMI.  GENERAL: vitals reviewed and listed above, alert, oriented, appears well hydrated and in no acute distress  HEENT: atraumatic, conjunttiva clear, no obvious abnormalities on inspection of external nose and ears  CV: HRRR,  improved thoracoepigastric vein rigidity and TTP  SKIN: non-healing papule R upper abd  MS: moves all extremities without noticeable abnormality  PSYCH: pleasant and cooperative, no obvious depression or anxiety  ASSESSMENT AND PLAN:  Discussed the following assessment and plan:  Phlebitis  Non-healing skin lesion  -scheduler to  check on mammo and notify pt -improving, can cont nsaid another 1-2 weeks if needed -advised dermatology eval non-healing lesion and she prefers to check with family dermatologist at [place] and is to call me if needs referral -Patient advised to return or notify a doctor immediately if symptoms worsen or persist or new concerns arise.  There are no Patient Instructions on file for this visit.   Maria Benton R.

## 2015-04-17 NOTE — Patient Instructions (Signed)
Get mammogram   Call dermatologist for evaluation - let me know if you need a referral  Antiinflammatory medication for 1-2 weeks as needed for pain  Follow up if persists in 1 month or other concerns

## 2015-04-17 NOTE — Progress Notes (Signed)
Pre visit review using our clinic review tool, if applicable. No additional management support is needed unless otherwise documented below in the visit note. Patient declines weight measurement today. 

## 2015-05-06 ENCOUNTER — Encounter: Payer: Self-pay | Admitting: Obstetrics and Gynecology

## 2015-05-28 ENCOUNTER — Telehealth: Payer: Self-pay | Admitting: Family Medicine

## 2015-05-28 NOTE — Telephone Encounter (Signed)
Pt states it is time to refill her  citalopram (CELEXA) 20 MG tablet  But pt states she used to be on Lexapro 20 mg. Pt would like to go back on that rx, and have it called in Dr Maudie Mercury has never prescribed [facility]

## 2015-05-28 NOTE — Telephone Encounter (Signed)
Can you help her with scheduling appt to consider medication change. Ok to refill celexa in interim. Thanks.

## 2015-05-29 ENCOUNTER — Telehealth: Payer: Self-pay | Admitting: Family Medicine

## 2015-05-29 ENCOUNTER — Ambulatory Visit (INDEPENDENT_AMBULATORY_CARE_PROVIDER_SITE_OTHER): Payer: 59 | Admitting: *Deleted

## 2015-05-29 ENCOUNTER — Other Ambulatory Visit: Payer: Self-pay | Admitting: Family Medicine

## 2015-05-29 DIAGNOSIS — Z23 Encounter for immunization: Secondary | ICD-10-CM

## 2015-05-29 DIAGNOSIS — E039 Hypothyroidism, unspecified: Secondary | ICD-10-CM

## 2015-05-29 MED ORDER — LEVOTHYROXINE SODIUM 75 MCG PO TABS: 75.0000 ug | ORAL_TABLET | Freq: Every day | ORAL | Status: DC

## 2015-05-29 MED ORDER — CITALOPRAM HYDROBROMIDE 20 MG PO TABS: 20.0000 mg | ORAL_TABLET | Freq: Every day | ORAL | Status: DC

## 2015-05-29 NOTE — Telephone Encounter (Signed)
Pt is aware md out of office until wednesday

## 2015-05-29 NOTE — Telephone Encounter (Signed)
Patient called to check the status of having her Lexapro called in instead if the Celexa. Says that she was on that for a long time and only switched due to insurance coverage. Now her current insurance will cover and she wants to go back to Lexapro. States she is here all the time and does not think an office visit is necessary.

## 2015-05-29 NOTE — Telephone Encounter (Signed)
Pt needs refill on celexa #30 ONLY and levothyroxine 75 mcg #90 send to [facility] in [place] ,[place]. Pt is out of celexa

## 2015-05-29 NOTE — Telephone Encounter (Signed)
Rxs done and the pt was informed when she came in for her flu vaccine.

## 2015-05-29 NOTE — Telephone Encounter (Signed)
Patient informed of the message below when she came in for her flu vaccine and stated she will call back for an appt.

## 2015-06-23 ENCOUNTER — Telehealth: Payer: Self-pay | Admitting: Oncology

## 2015-06-23 NOTE — Telephone Encounter (Signed)
Returned patient call re cx lab/fu. Per patient just cx for now she will call back to r/s.

## 2015-06-24 ENCOUNTER — Ambulatory Visit: Payer: 59 | Admitting: Oncology

## 2015-06-26 ENCOUNTER — Ambulatory Visit (INDEPENDENT_AMBULATORY_CARE_PROVIDER_SITE_OTHER): Payer: 59 | Admitting: Family Medicine

## 2015-06-26 ENCOUNTER — Other Ambulatory Visit: Payer: 59

## 2015-06-26 ENCOUNTER — Encounter: Payer: Self-pay | Admitting: Family Medicine

## 2015-06-26 VITALS — BP 112/68 | HR 85 | Temp 97.4°F | Ht 60.0 in

## 2015-06-26 DIAGNOSIS — E038 Other specified hypothyroidism: Secondary | ICD-10-CM

## 2015-06-26 DIAGNOSIS — F411 Generalized anxiety disorder: Secondary | ICD-10-CM

## 2015-06-26 DIAGNOSIS — J4520 Mild intermittent asthma, uncomplicated: Secondary | ICD-10-CM

## 2015-06-26 DIAGNOSIS — D500 Iron deficiency anemia secondary to blood loss (chronic): Secondary | ICD-10-CM

## 2015-06-26 DIAGNOSIS — N924 Excessive bleeding in the premenopausal period: Secondary | ICD-10-CM

## 2015-06-26 MED ORDER — ESCITALOPRAM OXALATE 10 MG PO TABS: 20.0000 mg | ORAL_TABLET | Freq: Every day | ORAL | Status: DC

## 2015-06-26 NOTE — Progress Notes (Signed)
HPI:  Depression and anxiety (mostly anxiety)/muscle pain: -chronic -feels like celexa has not worked as well for her generalized anxiety as lexapro did - reports lexapro was stopped due to cost in the past, wants to go back to lexapro -meds: celexa -symptoms: mostly anxiety -denies: SI, panic, manic symptoms  Menorrhagia -chronic heavy menstrual bleeding -pt seeing gyn, had TVUS, EMB, tried various treatments, considering hysterectomy -denies palpitations, syncope, dizziness  Anemia and elevated platelets: -likely from her heavy bleeding -on oral iron and responded well; seeing oncologist -denies: palpitations, dizziness  Obesity: -diet and exercise: she is trying to work on diet, trying to walk/ exercise -denies: weakness, numbness, trauma  Hypothyroid: -started: dx 1 year ago -denies: hx ca or goiter -meds: synthroid 75 mcg  Seasonal allergies/mod persistent asthma: -no symptoms curretnly -started: started in her adult life -treatments: advair daily, alb prn -worse with: spring and fall, was allergy tested remotely -meds: claritin -denies: wheezing, SOB  ROS: See pertinent positives and negatives per HPI.  Past Medical History  Diagnosis Date  . Thyroid disease   . Asthma   . Generalized anxiety disorder 11/21/2014  . Menorrhagia 11/21/2014    -seeing gyn    . Anemia 11/21/2014    -with elevated platelets   . Obesity 11/21/2014  . Breast cyst 11/21/2014    -L breast, upper L just off from center -stable per radiology 10/2014   . Seasonal allergies 11/21/2014  . Plantar fasciitis of left foot 11/21/2014    Past Surgical History  Procedure Laterality Date  . Cesarean section    . Tubal ligation      Family History  Problem Relation Age of Onset  . Breast cancer Mother   . Breast cancer Paternal Grandmother   . Cervical cancer Maternal Grandmother   . Arthritis Maternal Grandmother   . CVA Maternal Grandmother     Social History   Social History  . Marital  Status: Married    Spouse Name: N/A  . Number of Children: N/A  . Years of Education: N/A   Social History Main Topics  . Smoking status: Former Research scientist (life sciences)  . Smokeless tobacco: Never Used  . Alcohol Use: 0.6 oz/week    1 Glasses of wine per week     Comment: socially  . Drug Use: No  . Sexual Activity: Yes    Birth Control/ Protection: Surgical   Other Topics Concern  . None   Social History Narrative     Current outpatient prescriptions:  .  albuterol (PROVENTIL HFA;VENTOLIN HFA) 108 (90 BASE) MCG/ACT inhaler, Inhale 1 puff into the lungs every 6 (six) hours as needed for wheezing or shortness of breath., Disp: 3 Inhaler, Rfl: 0 .  Fluticasone-Salmeterol (ADVAIR) 250-50 MCG/DOSE AEPB, Inhale 1 puff into the lungs daily as needed (for shortness of breath)., Disp: 60 each, Rfl: 3 .  levothyroxine (SYNTHROID, LEVOTHROID) 75 MCG tablet, Take 1 tablet (75 mcg total) by mouth daily before breakfast., Disp: 90 tablet, Rfl: 0 .  loratadine (CLARITIN) 10 MG tablet, Take 1 tablet (10 mg total) by mouth daily., Disp: 90 tablet, Rfl: 0 .  naproxen (NAPROSYN) 500 MG tablet, Take 1 tablet (500 mg total) by mouth 2 (two) times daily with a meal., Disp: 30 tablet, Rfl: 0 .  escitalopram (LEXAPRO) 10 MG tablet, Take 2 tablets (20 mg total) by mouth daily., Disp: 60 tablet, Rfl: 2  EXAM:  Filed Vitals:   06/26/15 1018  BP: 112/68  Pulse: 85  Temp: 97.4 F (36.3  C)    There is no weight on file to calculate BMI.  GENERAL: vitals reviewed and listed above, alert, oriented, appears well hydrated and in no acute distress  HEENT: atraumatic, conjunttiva clear, no obvious abnormalities on inspection of external nose and ears  NECK: no obvious masses on inspection  LUNGS: clear to auscultation bilaterally, no wheezes, rales or rhonchi, good air movement  CV: HRRR, no peripheral edema  MS: moves all extremities without noticeable abnormality  PSYCH: pleasant and cooperative, no obvious  depression or anxiety  ASSESSMENT AND PLAN:  Discussed the following assessment and plan:  Generalized anxiety disorder -stop celexa, start lexapro - discussed other options pharmacological and nonpharmacological and risks -follow up in 1-2 months, sooner if needed  Other specified hypothyroidism -check labs at follow up  Excessive bleeding in premenopausal period Iron deficiency anemia due to chronic blood loss -considering hysterectomy, continues oral iron  Asthma, chronic, mild intermittent, uncomplicated -stable  -Patient advised to return or notify a doctor immediately if symptoms worsen or persist or new concerns arise.  Patient Instructions  BEFORE YOU LEAVE: -follow up appointment in 1-2 months  STOP the celexa  START the lexapro 10mg , may increase to 20mg  in 2 weeks if needed  We recommend the following healthy lifestyle measures: - eat a healthy whole foods diet consisting of regular small meals composed of vegetables, fruits, beans, nuts, seeds, healthy meats such as white chicken and fish and whole grains.  - avoid sweets, white starchy foods, fried foods, fast food, processed foods, sodas, red meet and other fattening foods.  - get a least 150-300 minutes of aerobic exercise per week.       Colin Benton R.

## 2015-06-26 NOTE — Progress Notes (Signed)
Pre visit review using our clinic review tool, if applicable. No additional management support is needed unless otherwise documented below in the visit note. 

## 2015-06-26 NOTE — Patient Instructions (Signed)
BEFORE YOU LEAVE: -follow up appointment in 1-2 months  STOP the celexa  START the lexapro 10mg , may increase to 20mg  in 2 weeks if needed  We recommend the following healthy lifestyle measures: - eat a healthy whole foods diet consisting of regular small meals composed of vegetables, fruits, beans, nuts, seeds, healthy meats such as white chicken and fish and whole grains.  - avoid sweets, white starchy foods, fried foods, fast food, processed foods, sodas, red meet and other fattening foods.  - get a least 150-300 minutes of aerobic exercise per week.

## 2015-07-17 ENCOUNTER — Encounter: Payer: Self-pay | Admitting: Obstetrics and Gynecology

## 2015-07-28 ENCOUNTER — Telehealth: Payer: Self-pay | Admitting: Family Medicine

## 2015-07-28 MED ORDER — ESCITALOPRAM OXALATE 20 MG PO TABS: 20.0000 mg | ORAL_TABLET | Freq: Every day | ORAL | Status: DC

## 2015-07-28 NOTE — Telephone Encounter (Signed)
I called the pt and she states she prefers to take 20mg  a day and this is cheaper.  She is aware the Rx was sent to her pharmacy and stated she has a follow up appt.

## 2015-07-28 NOTE — Telephone Encounter (Signed)
Patient would like her Escitalopram prescription changed to 20mg  tablets, 90 day supply with refills for a year.

## 2015-07-28 NOTE — Telephone Encounter (Signed)
We sent 3 month supply at her visit and she was to follow up in 1-2 months to see how this was going. Ok to send 90 day 20mg  rx if cheaper, but still advise to keep follow up. Thanks.

## 2015-08-16 HISTORY — PX: ABDOMINAL HYSTERECTOMY: SHX81

## 2015-08-16 HISTORY — PX: BREAST EXCISIONAL BIOPSY: SUR124

## 2015-08-18 ENCOUNTER — Telehealth: Payer: Self-pay | Admitting: Obstetrics and Gynecology

## 2015-08-18 NOTE — Telephone Encounter (Signed)
Patient states she is mentally ready to proceed with scheduling surgery. Not physically any worse, just tired of dealing with it and is ready and anxious to proceed. Last office visit 11-28-14. Advised will need office visit to discuss. Patient agreeable to appointment however; asking to proceed with scheduling so that is not further delayed while waiting for an appointment. Surgery date options and scheduling policies discussed. Brief discussion on recovery time and hospital stay, tentative plan for 10-06-15 surgery date pending appointment with Dr Quincy Simmonds. Appointment with Dr Quincy Simmonds scheduled for Thursday 08-20-15 at 0900. Last annual exam was 11-05-14.  Routing to provider for final review. Patient agreeable to disposition. Will close encounter.

## 2015-08-18 NOTE — Telephone Encounter (Signed)
Patient is ready to schedule her surgery.  °

## 2015-08-20 ENCOUNTER — Encounter: Payer: Self-pay | Admitting: Family Medicine

## 2015-08-20 ENCOUNTER — Encounter: Payer: Self-pay | Admitting: Obstetrics and Gynecology

## 2015-08-20 ENCOUNTER — Ambulatory Visit (INDEPENDENT_AMBULATORY_CARE_PROVIDER_SITE_OTHER): Payer: 59 | Admitting: Obstetrics and Gynecology

## 2015-08-20 ENCOUNTER — Ambulatory Visit (INDEPENDENT_AMBULATORY_CARE_PROVIDER_SITE_OTHER): Payer: 59 | Admitting: Family Medicine

## 2015-08-20 VITALS — BP 122/78 | HR 68 | Resp 16 | Ht 60.0 in | Wt 212.0 lb

## 2015-08-20 VITALS — BP 100/76 | HR 108 | Temp 98.6°F | Ht 60.0 in

## 2015-08-20 DIAGNOSIS — F411 Generalized anxiety disorder: Secondary | ICD-10-CM | POA: Diagnosis not present

## 2015-08-20 DIAGNOSIS — Z1151 Encounter for screening for human papillomavirus (HPV): Secondary | ICD-10-CM | POA: Diagnosis not present

## 2015-08-20 DIAGNOSIS — N920 Excessive and frequent menstruation with regular cycle: Secondary | ICD-10-CM | POA: Diagnosis not present

## 2015-08-20 DIAGNOSIS — Z124 Encounter for screening for malignant neoplasm of cervix: Secondary | ICD-10-CM

## 2015-08-20 DIAGNOSIS — N852 Hypertrophy of uterus: Secondary | ICD-10-CM | POA: Diagnosis not present

## 2015-08-20 DIAGNOSIS — E038 Other specified hypothyroidism: Secondary | ICD-10-CM

## 2015-08-20 DIAGNOSIS — N924 Excessive bleeding in the premenopausal period: Secondary | ICD-10-CM | POA: Diagnosis not present

## 2015-08-20 DIAGNOSIS — F3341 Major depressive disorder, recurrent, in partial remission: Secondary | ICD-10-CM

## 2015-08-20 DIAGNOSIS — E669 Obesity, unspecified: Secondary | ICD-10-CM

## 2015-08-20 LAB — CBC
HEMATOCRIT: 38.7 % (ref 36.0–46.0)
Hemoglobin: 12.6 g/dL (ref 12.0–15.0)
MCH: 27.9 pg (ref 26.0–34.0)
MCHC: 32.6 g/dL (ref 30.0–36.0)
MCV: 85.8 fL (ref 78.0–100.0)
MPV: 8.7 fL (ref 8.6–12.4)
Platelets: 482 10*3/uL — ABNORMAL HIGH (ref 150–400)
RBC: 4.51 MIL/uL (ref 3.87–5.11)
RDW: 13.7 % (ref 11.5–15.5)
WBC: 9.9 10*3/uL (ref 4.0–10.5)

## 2015-08-20 LAB — TSH: TSH: 3.55 u[IU]/mL (ref 0.35–4.50)

## 2015-08-20 MED ORDER — ORLISTAT 120 MG PO CAPS: 120.0000 mg | ORAL_CAPSULE | Freq: Three times a day (TID) | ORAL | Status: DC

## 2015-08-20 NOTE — Patient Instructions (Signed)
BEFORE YOU LEAVE: -follow up in 3-4 months -lab  -We have ordered labs or studies at this visit. It can take up to 1-2 weeks for results and processing. We will contact you with instructions IF your results are abnormal. Normal results will be released to your [facility]. If you have not heard from [name] or can not find your results in [facility] in 2 weeks please contact our office.  -consider seeing Dr. Glennon Hamilton about the anxiety

## 2015-08-20 NOTE — Progress Notes (Signed)
Pre visit review using our clinic review tool, if applicable. No additional management support is needed unless otherwise documented below in the visit note. Patient declines weight measurement today. 

## 2015-08-20 NOTE — Progress Notes (Signed)
45 y.o. G75P3000 Married Caucasian female here for evaluation and treatment of menstrual cycles.  Asking about hysterectomy.  Husband present for the entire visit.   Menses are monthly.  "Super heavy." Bleeding through clothing and staining sheets.  Tampons can be falling out every 1.5 hours due to heavy flow. Increased cramping.  Not taking any pain medication.  Having night sweats.   Pelvic ultrasound on 11/20/14 Uterus - no fibroids.  EMS 4.62 mm.  Ovaries normal.  No free fluid.   EMB 11/20/14 - no endometrial tissue. Pipelle was able to go in only 5 cm.   Birth control pills did not help bleeding in the past.   Hgb 12.8 on 02/19/15. Stopped taking iron because it was not helping any.   Having incontinence during her menses.  Has more urgency.  Then leaks.  Urgency is more of a problem for patient than stress incontinence.  Stands up and needs to void immediately.  Some leak with cough or sneezing occurs regularly - slight leakage. No leaks without warning.  No regular caffeine use.   Status post Cesarean Section x 3 and BTL.  Declines future childbearing.   Mother with post menopausal breast cancer.  Paternal grandmother with breast cancer in her 58s.   Patient's last menstrual period was 08/03/2015 (exact date).          Sexually active: Yes.    The current method of family planning is none.    Exercising: No.  None Smoker:  no  Health Maintenance: Pap:  ? Patient states 03/16.  No record of this in EPIC. History of abnormal Pap:  no MMG:  04/17/2015 BIRADS Category 1 Negative.  This was a diagnostic right breast mammogram and ultrasound.   11/04/14 - bilateral diagnostic mammogram - BiRADS 2. Benign stable mass of left breast.  Colonoscopy: Never BMD:  Never   TDaP:  11/2014     reports that she has quit smoking. She has never used smokeless tobacco. She reports that she drinks about 0.6 oz of alcohol per week. She reports that she does not use illicit  drugs.  Past Medical History  Diagnosis Date  . Thyroid disease   . Asthma   . Generalized anxiety disorder 11/21/2014  . Menorrhagia 11/21/2014    -seeing gyn    . Anemia 11/21/2014    -with elevated platelets   . Obesity 11/21/2014  . Breast cyst 11/21/2014    -L breast, upper L just off from center -stable per radiology 10/2014   . Seasonal allergies 11/21/2014  . Plantar fasciitis of left foot 11/21/2014    Past Surgical History  Procedure Laterality Date  . Cesarean section    . Tubal ligation      Current Outpatient Prescriptions  Medication Sig Dispense Refill  . albuterol (PROVENTIL HFA;VENTOLIN HFA) 108 (90 BASE) MCG/ACT inhaler Inhale 1 puff into the lungs every 6 (six) hours as needed for wheezing or shortness of breath. 3 Inhaler 0  . escitalopram (LEXAPRO) 20 MG tablet Take 1 tablet (20 mg total) by mouth daily. 90 tablet 1  . Fluticasone-Salmeterol (ADVAIR) 250-50 MCG/DOSE AEPB Inhale 1 puff into the lungs daily as needed (for shortness of breath). 60 each 3  . levothyroxine (SYNTHROID, LEVOTHROID) 75 MCG tablet Take 1 tablet (75 mcg total) by mouth daily before breakfast. 90 tablet 0  . loratadine (CLARITIN) 10 MG tablet Take 1 tablet (10 mg total) by mouth daily. 90 tablet 0   No current facility-administered medications for this  visit.    Family History  Problem Relation Age of Onset  . Breast cancer Mother   . Breast cancer Paternal Grandmother   . Cervical cancer Maternal Grandmother   . Arthritis Maternal Grandmother   . CVA Maternal Grandmother     ROS:  Pertinent items are noted in HPI.  Otherwise, a comprehensive ROS was negative.  Exam:   BP 122/78 mmHg  Pulse 68  Resp 16  Ht 5' (1.524 m)  Wt 212 lb (96.163 kg)  BMI 41.40 kg/m2  LMP 08/03/2015 (Exact Date)    General appearance: alert, cooperative and appears stated age Head: Normocephalic, without obvious abnormality, atraumatic Neck: no adenopathy, supple, symmetrical, trachea midline and thyroid  normal to inspection and palpation Lungs: clear to auscultation bilaterally Breasts: normal appearance, no masses or tenderness, Inspection negative, No nipple retraction or dimpling, No nipple discharge or bleeding, No axillary or supraclavicular adenopathy Heart: regular rate and rhythm Abdomen: soft, non-tender; bowel sounds normal; no masses,  no organomegaly Extremities: extremities normal, atraumatic, no cyanosis or edema Skin: Skin color, texture, turgor normal. No rashes or lesions Lymph nodes: Cervical, supraclavicular, and axillary nodes normal. No abnormal inguinal nodes palpated Neurologic: Grossly normal  Pelvic: External genitalia:  no lesions              Urethra:  normal appearing urethra with no masses, tenderness or lesions              Bartholins and Skenes: normal                 Vagina: normal appearing vagina with normal color and discharge, no lesions              Cervix: no lesions.  High above the pubic symphysis.              Pap taken: Yes.   Bimanual Exam:  Uterus:  Enlarged to 12 week size.  Good mobility.  Cervix is small.               Adnexa: normal adnexa and no mass, fullness, tenderness              Rectovaginal: Yes.  .  Confirms.              Anus:  normal sphincter tone, no lesions  Chaperone was present for exam.  Assessment:   Menorrhagia.  Dysmenorrhea.  Enlarged uterus. Status post Cesarean Section x 3.  Status post BTL.  Mixed incontinence.  Plan:   Pap and HR HPV done.  CBC. Pelvic ultrasound ordered.  Extensive discussion regarding robotic laparoscopic hysterectomy with bilateral salpingectomy.  Bilateral oophorectomy if abnormal findings at the time of surgery. Risks, benefits, and alternatives discussed with the patient.  She understands she may be treated with hormonal contraceptives or proceed with hysterectomy.  Mirena IUD not recommended due to cervical stenosis or adhesions due to prior Cesarean Sections.  Risks include but  are not limited to bleeding, infection, damage to surrounding organs, reaction to anesthesia, pneumonia, DVT, PE, death, hernia formation, vaginal cuff dehiscence, need for reoperation, laparotomy to complete the procedure, and menopausal symptoms if ovaries are removed.  I did highlight risk of urinary tract injury due to patient's prior multiple Cesarean Sections.  Patient understands that menopausal symptoms can be treated with estrogenic and nonestrogenic medication.  Sexual functioning discussed following hysterectomy and how ovarian removal impacts this as well. Surgical expectations and recovery discussed.   ____40___ minutes face to face time  of which over 50% was spent in counseling.    After visit summary provided.

## 2015-08-20 NOTE — Progress Notes (Addendum)
HPI:  Follow up:  GAD/Depression: -at last visit changed from celexa to lexapro due to prior good results on lexapro  -reports: is doing better, some anxiety, no CBT -denies: thoughts self harm  Other chronic medical problems:  Menorrhagia -chronic heavy menstrual bleeding -pt seeing gyn, had TVUS, EMB, tried various treatments, considering hysterectomy -denies palpitations, syncope, dizziness  Anemia and elevated platelets: -likely from her heavy bleeding -on oral iron and responded well; seeing oncologist -denies: palpitations, dizziness -did labs with gyn today  Obesity: -diet and exercise: she is trying to work on diet, trying to walk/ exercise - reports is doing well with this, then says struggle with it -wants to try medication to assist -denies: weakness, numbness, trauma  Hypothyroid: -started: dx 1 year ago -denies: hx ca or goiter -meds: synthroid 75 mcg  Seasonal allergies/mod persistent asthma: -no symptoms curretnly -started: started in her adult life -treatments: advair daily, alb prn -worse with: spring and fall, was allergy tested remotely -meds: claritin -denies: wheezing, SOB  ROS: See pertinent positives and negatives per HPI.  Past Medical History  Diagnosis Date  . Thyroid disease   . Asthma   . Generalized anxiety disorder 11/21/2014  . Menorrhagia 11/21/2014    -seeing gyn    . Anemia 11/21/2014    -with elevated platelets   . Obesity 11/21/2014  . Breast cyst 11/21/2014    -L breast, upper L just off from center -stable per radiology 10/2014   . Seasonal allergies 11/21/2014  . Plantar fasciitis of left foot 11/21/2014    Past Surgical History  Procedure Laterality Date  . Cesarean section    . Tubal ligation      Family History  Problem Relation Age of Onset  . Breast cancer Mother   . Breast cancer Paternal Grandmother   . Cervical cancer Maternal Grandmother   . Arthritis Maternal Grandmother   . CVA Maternal Grandmother      Social History   Social History  . Marital Status: Married    Spouse Name: N/A  . Number of Children: N/A  . Years of Education: N/A   Social History Main Topics  . Smoking status: Former Research scientist (life sciences)  . Smokeless tobacco: Never Used  . Alcohol Use: 0.6 oz/week    1 Glasses of wine per week     Comment: socially  . Drug Use: No  . Sexual Activity: Yes    Birth Control/ Protection: Surgical   Other Topics Concern  . None   Social History Narrative     Current outpatient prescriptions:  .  albuterol (PROVENTIL HFA;VENTOLIN HFA) 108 (90 BASE) MCG/ACT inhaler, Inhale 1 puff into the lungs every 6 (six) hours as needed for wheezing or shortness of breath., Disp: 3 Inhaler, Rfl: 0 .  escitalopram (LEXAPRO) 20 MG tablet, Take 1 tablet (20 mg total) by mouth daily., Disp: 90 tablet, Rfl: 1 .  Fluticasone-Salmeterol (ADVAIR) 250-50 MCG/DOSE AEPB, Inhale 1 puff into the lungs daily as needed (for shortness of breath)., Disp: 60 each, Rfl: 3 .  levothyroxine (SYNTHROID, LEVOTHROID) 75 MCG tablet, Take 1 tablet (75 mcg total) by mouth daily before breakfast., Disp: 90 tablet, Rfl: 0 .  loratadine (CLARITIN) 10 MG tablet, Take 1 tablet (10 mg total) by mouth daily., Disp: 90 tablet, Rfl: 0 .  orlistat (XENICAL) 120 MG capsule, Take 1 capsule (120 mg total) by mouth 3 (three) times daily with meals., Disp: 90 capsule, Rfl: 3  EXAM:  Filed Vitals:   08/20/15 1255  BP: 100/76  Pulse: 108  Temp: 98.6 F (37 C)    There is no weight on file to calculate BMI.  GENERAL: vitals reviewed and listed above, alert, oriented, appears well hydrated and in no acute distress  HEENT: atraumatic, conjunttiva clear, no obvious abnormalities on inspection of external nose and ears  NECK: no obvious masses on inspection  LUNGS: clear to auscultation bilaterally, no wheezes, rales or rhonchi, good air movement  CV: HRRR, no peripheral edema  MS: moves all extremities without noticeable  abnormality  PSYCH: pleasant and cooperative, no obvious depression or anxiety  ASSESSMENT AND PLAN:  Discussed the following assessment and plan:  Generalized anxiety disorder  Other specified hypothyroidism - Plan: TSH  Recurrent major depressive disorder, in partial remission (HCC)  Excessive bleeding in premenopausal period  Obesity  -TSH -consider CBT -follow up 3-4 months or sooner as needed -discussed options for weight loss and recommended lifelong commitment to healthy lifestyle as backbone - opte dot try xenical as well after discussion risks/benefits -Patient advised to return or notify a doctor immediately if symptoms worsen or persist or new concerns arise.  Patient Instructions  BEFORE YOU LEAVE: -follow up in 3-4 months -lab  -We have ordered labs or studies at this visit. It can take up to 1-2 weeks for results and processing. We will contact you with instructions IF your results are abnormal. Normal results will be released to your [facility] If you have not heard from [name] or can not find your results in [facility] in 2 weeks please contact our office.  -consider seeing Dr. Glennon Hamilton about the anxiety          KIM, Nickola Major.

## 2015-08-20 NOTE — Addendum Note (Signed)
Addended by: Lucretia Kern on: 08/20/2015 01:40 PM   Modules accepted: Orders

## 2015-08-21 ENCOUNTER — Telehealth: Payer: Self-pay | Admitting: *Deleted

## 2015-08-21 ENCOUNTER — Other Ambulatory Visit: Payer: Self-pay | Admitting: Family Medicine

## 2015-08-21 MED ORDER — LEVOTHYROXINE SODIUM 88 MCG PO TABS: ORAL_TABLET | ORAL | Status: DC

## 2015-08-21 MED ORDER — LEVOTHYROXINE SODIUM 75 MCG PO TABS: ORAL_TABLET | ORAL | Status: DC

## 2015-08-21 NOTE — Telephone Encounter (Addendum)
Pt's husband called stating the pharmacy didn't receive the Xenical.

## 2015-08-21 NOTE — Telephone Encounter (Signed)
I called [facility] pharmacy and spoke with Kathlee Nations and she stated they do have the Rx for Xenical and they are awaiting a prior authorization and I called the pt and informed her of this.

## 2015-08-21 NOTE — Addendum Note (Signed)
Addended by: Agnes Lawrence on: 08/21/2015 03:44 PM   Modules accepted: Orders

## 2015-08-21 NOTE — Telephone Encounter (Signed)
Rx done and the pt was informed. 

## 2015-08-24 ENCOUNTER — Telehealth: Payer: Self-pay | Admitting: Obstetrics and Gynecology

## 2015-08-24 ENCOUNTER — Telehealth: Payer: Self-pay

## 2015-08-24 NOTE — Telephone Encounter (Signed)
Spoke with patient. Advised of results as seen below from [place]. Patient is agreeable and verbalizes understanding.  Routing to provider for final review. Patient agreeable to disposition. Will close encounter.

## 2015-08-24 NOTE — Telephone Encounter (Signed)
Spoke with pt regarding benefit for ultrasound. Patient understood and agreeable. Patient ready to schedule. Patient scheduled 09/01/15 with Dr. Quincy Simmonds. Pt aware of arrival date and time. Pt aware of 72 hours cancellation policy with [id number] fee. No further questions. Ok to close

## 2015-08-24 NOTE — Telephone Encounter (Signed)
-----   Message from Nunzio Cobbs, MD sent at 08/20/2015  8:20 PM EST ----- Please report CBC to patient.  Her hemoglobin is normal.  Her platelet count is slightly elevated, but is in a range which it has been in the past.  The platelet levels do fluctuate.   Pap is pending.

## 2015-08-25 LAB — IPS PAP TEST WITH HPV

## 2015-08-26 ENCOUNTER — Telehealth: Payer: Self-pay | Admitting: Obstetrics and Gynecology

## 2015-08-26 NOTE — Telephone Encounter (Signed)
Patient is asking to talk with Maria [name] again regarding scheduling her ultrasound.

## 2015-09-03 ENCOUNTER — Encounter: Payer: Self-pay | Admitting: Obstetrics and Gynecology

## 2015-09-03 ENCOUNTER — Ambulatory Visit (INDEPENDENT_AMBULATORY_CARE_PROVIDER_SITE_OTHER): Payer: 59 | Admitting: Obstetrics and Gynecology

## 2015-09-03 ENCOUNTER — Ambulatory Visit (INDEPENDENT_AMBULATORY_CARE_PROVIDER_SITE_OTHER): Payer: 59

## 2015-09-03 VITALS — BP 106/60 | HR 70 | Ht 60.0 in | Wt 209.0 lb

## 2015-09-03 DIAGNOSIS — N852 Hypertrophy of uterus: Secondary | ICD-10-CM

## 2015-09-03 DIAGNOSIS — N920 Excessive and frequent menstruation with regular cycle: Secondary | ICD-10-CM

## 2015-09-03 DIAGNOSIS — N946 Dysmenorrhea, unspecified: Secondary | ICD-10-CM | POA: Diagnosis not present

## 2015-09-03 NOTE — Progress Notes (Signed)
Subjective  45 y.o. G3P3000 married Caucasian female here for pelvic ultrasound for uterine enlargement.  Husband present for the entire visit.   Patient has dysmenorrhea and heavy bleeding and clotting with cycles.  Declines surgical care for leakage of urine with coughing and sneezing.  Has more urgency issues. Hx of C/S x 3. Hx BTL.  Objective  Pelvic ultrasound images and report reviewed with patient.  Uterus - normal.  No masses.  EMS - 5.93 mm. Ovaries - normal. Free fluid - no       Assessment  Uterine enlargement.  No adenomyosis suggested. Dysmenorrhea and menorrhagia. FH of breast cancer.   Plan  Discussion of pelvic anatomy.  Reassurance regarding no fibroids.   Proceed with robotic TLH/bilateral salpingectomy, possible bilateral oophorectomy if have significant disease of ovaries.  Discussed risks of removal versus maintaining ovaries.  Risks of maintaining ovaries - continued pain, future surgical care.  Risks of removing ovaries - menopausal symptoms, increased risk of heart disease and osteoporosis, decreased libido.  Wishes to proceed with surgery.   __25_____ minutes face to face time of which over 50% was spent in counseling.   After visit summary to patient.

## 2015-09-08 ENCOUNTER — Telehealth: Payer: Self-pay | Admitting: *Deleted

## 2015-09-08 ENCOUNTER — Telehealth: Payer: Self-pay | Admitting: Obstetrics and Gynecology

## 2015-09-08 NOTE — Telephone Encounter (Signed)
Called patient to discuss benefits for a procedure. Left Voicemail requesting a call back. °

## 2015-09-08 NOTE — Telephone Encounter (Signed)
Call to patient. Surgery date options discussed. Surgery scheduled for 10-06-15 at 0730 at [place] [address], hospital will confirm. Surgery instruction sheet reviewed and printed copy will be given to patient at surgery consult on 09-15-15 here in office.See copy scanned to chart.  Routing to provider for final review. Patient agreeable to disposition. Will close encounter.

## 2015-09-08 NOTE — Telephone Encounter (Signed)
Spoke with pt regarding benefit for surgery. Patient understood and agreeable. Patient is scheduled for 10/06/15. Patient provided full surgery prepayment over the phone. Patient aware this is professional benefit only. Patient aware will be contacted by hospital for separate benefits. Staff message to [facility] for scheduling

## 2015-09-14 ENCOUNTER — Ambulatory Visit (INDEPENDENT_AMBULATORY_CARE_PROVIDER_SITE_OTHER): Payer: 59 | Admitting: Obstetrics and Gynecology

## 2015-09-14 ENCOUNTER — Encounter: Payer: Self-pay | Admitting: Obstetrics and Gynecology

## 2015-09-14 VITALS — BP 110/70 | HR 80 | Resp 16 | Ht 60.0 in | Wt 208.4 lb

## 2015-09-14 DIAGNOSIS — N920 Excessive and frequent menstruation with regular cycle: Secondary | ICD-10-CM

## 2015-09-14 NOTE — Progress Notes (Signed)
Patient ID: Maria [name], female   DOB: April 30, 1971, 45 y.o.   MRN: [id number] GYNECOLOGY  VISIT   HPI: 45 y.o.   Married  Caucasian  female   G3P3000 with Patient's last menstrual period was 08/29/2015 (exact date).   here for surgical consult. Husband present for the entire visit.   Requesting hysterectomy for menorrhagia and increased cramping during her cycles.  Declines future childbearing. Status post BTL. Hx of Cesarean Section x 3.   Pelvic U/S 07/03/16 -  Uterus - normal. No masses.  EMS - 5.93 mm. Ovaries - normal. Free fluid - no  Unable to do EMB due to cervical stenosis.   08/20/15 - TSH - 3.55 CBC - Hgb 12.6, Platelet 482,000.  GYNECOLOGIC HISTORY: Patient's last menstrual period was 08/29/2015 (exact date). Contraception:Tubal Menopausal hormone therapy: none Last mammogram: 04/17/2015 BIRADS Category 1 Negative. This was a diagnostic right breast mammogram and ultrasound. 11/04/14 - bilateral diagnostic mammogram - BiRADS 2. Benign stable mass of left breast.  Last pap smear:  08-20-15 Neg:Neg HR HPV        OB History    Gravida Para Term Preterm AB TAB SAB Ectopic Multiple Living   3 3 3                 Patient Active Problem List   Diagnosis Date Noted  . Breast cyst 11/21/2014  . Menorrhagia 11/21/2014  . Anemia 11/21/2014  . Hypothyroidism 11/21/2014  . Obesity 11/21/2014  . Seasonal allergies 11/21/2014  . Asthma, chronic 11/21/2014  . Generalized anxiety disorder 11/21/2014    Past Medical History  Diagnosis Date  . Thyroid disease   . Asthma   . Generalized anxiety disorder 11/21/2014  . Menorrhagia 11/21/2014    -seeing gyn    . Anemia 11/21/2014    -with elevated platelets   . Obesity 11/21/2014  . Breast cyst 11/21/2014    -L breast, upper L just off from center -stable per radiology 10/2014   . Seasonal allergies 11/21/2014  . Plantar fasciitis of left foot 11/21/2014    Past Surgical History  Procedure Laterality Date  . Cesarean  section    . Tubal ligation      Current Outpatient Prescriptions  Medication Sig Dispense Refill  . albuterol (PROVENTIL HFA;VENTOLIN HFA) 108 (90 BASE) MCG/ACT inhaler Inhale 1 puff into the lungs every 6 (six) hours as needed for wheezing or shortness of breath. 3 Inhaler 0  . escitalopram (LEXAPRO) 20 MG tablet Take 1 tablet (20 mg total) by mouth daily. 90 tablet 1  . fluticasone (FLONASE) 50 MCG/ACT nasal spray Place 1 spray into both nostrils daily.    . Fluticasone-Salmeterol (ADVAIR) 250-50 MCG/DOSE AEPB Inhale 1 puff into the lungs daily as needed (for shortness of breath). 60 each 3  . levothyroxine (SYNTHROID, LEVOTHROID) 75 MCG tablet Take one tablet daily 5 days a week 60 tablet 3  . levothyroxine (SYNTHROID, LEVOTHROID) 88 MCG tablet Take one pill 2 days a week. 8 tablet 3  . loratadine (CLARITIN) 10 MG tablet Take 1 tablet (10 mg total) by mouth daily. 90 tablet 0  . orlistat (XENICAL) 120 MG capsule Take 1 capsule (120 mg total) by mouth 3 (three) times daily with meals. 90 capsule 3   No current facility-administered medications for this visit.     ALLERGIES: Review of patient's allergies indicates no known allergies.  Family History  Problem Relation Age of Onset  . Breast cancer Mother   . Breast cancer Paternal Grandmother   .  Cervical cancer Maternal Grandmother   . Arthritis Maternal Grandmother   . CVA Maternal Grandmother     Social History   Social History  . Marital Status: Married    Spouse Name: N/A  . Number of Children: N/A  . Years of Education: N/A   Occupational History  . Not on file.   Social History Main Topics  . Smoking status: Former Research scientist (life sciences)  . Smokeless tobacco: Never Used  . Alcohol Use: 0.6 oz/week    1 Glasses of wine per week     Comment: socially  . Drug Use: No  . Sexual Activity:    Partners: Male    Birth Control/ Protection: Surgical     Comment: Tubal   Other Topics Concern  . Not on file   Social History  Narrative    ROS:  Pertinent items are noted in HPI.  PHYSICAL EXAMINATION:    BP 110/70 mmHg  Pulse 80  Resp 16  Ht 5' (1.524 m)  Wt 208 lb 6.4 oz (94.53 kg)  BMI 40.70 kg/m2  LMP 08/29/2015 (Exact Date)    General appearance: alert, cooperative and appears stated age Head: Normocephalic, without obvious abnormality, atraumatic Neck: no adenopathy, supple, symmetrical, trachea midline and thyroid normal to inspection and palpation Lungs: clear to auscultation bilaterally Heart: regular rate and rhythm Abdomen: Pfannenstiel incision intact, soft, non-tender; bowel sounds normal; no masses,  no organomegaly Extremities: extremities normal, atraumatic, no cyanosis or edema Skin: Skin color, texture, turgor normal. No rashes or lesions Lymph nodes: Cervical, supraclavicular, and axillary nodes normal. No abnormal inguinal nodes palpated Neurologic: Grossly normal  Pelvic: External genitalia:  no lesions              Urethra:  normal appearing urethra with no masses, tenderness or lesions              Bartholins and Skenes: normal                 Vagina: normal appearing vagina with normal color and discharge, no lesions              Cervix: no lesions and cervix very small.               Pap taken: No. Bimanual Exam:  Uterus:   Enlarged 10 - 12 week size.              Adnexa: normal adnexa and no mass, fullness, tenderness            Chaperone was present for exam.  ASSESSMENT  Menorrhagia.  Dysmenorrhea.  Enlarged uterus.  No fibroids.  Status post Cesarean Section x 3.  Status post BTL.  Mixed incontinence.  Declines tx for stress incontinence.    PLAN  Proceed with robotic total laproscopic hysterectomy/bilateral salpingectomy/possible bilateral oophorectomy.  Risks, benefits, and alternatives have been discussed with the patient who wishes to proceed. She understands that there is the risk of conversion to laparotomy due reasons including but not limited to  potential scar tissue, small size of cervix, and intolerance to Trendelenburg.  Surgical procedure and expectations reviewed including incision locations. She states she would like same day discharge from hospital if possible.    An After Visit Summary was printed and given to the patient.  __25____ minutes face to face time of which over 50% was spent in counseling.

## 2015-09-15 ENCOUNTER — Telehealth: Payer: Self-pay | Admitting: Family Medicine

## 2015-09-15 NOTE — Telephone Encounter (Signed)
Pt call and would like a call back. She said she has questions about the follow med levothyroxine (SYNTHROID, LEVOTHROID) 88 MCG tablet

## 2015-09-16 NOTE — Telephone Encounter (Signed)
I called the patient and she states she has only been taking the 73mcg 2 days a week, 60mcg the other days and feels better as she now can taste food, muscle pain is gone and just feels different in general and despite what the labs say she feels this is the dose she needs.

## 2015-09-16 NOTE — Telephone Encounter (Signed)
I did not advise taking 88 every day. Has she been taking 24mcg every day? If so, how long? Otherwise would continue per prior recs and recheck labs 3 months after last visit.

## 2015-09-16 NOTE — Telephone Encounter (Signed)
I called the pt and informed her of the message below and she stated she wants to talk to Dr Maudie Mercury.  I advised her Dr Maudie Mercury is gone for the day and this will be addressed on Thursday.

## 2015-09-16 NOTE — Telephone Encounter (Signed)
I called the pt and she wanted to know if she can begin taking the Levothyroxine 68mcg every day instead of alternating with the 43mcg as she feels a lot better with this dose?

## 2015-09-16 NOTE — Telephone Encounter (Signed)
I am glad she feels better taking the dose I advised. Would continue this dosing as upping to 40mcg every day may overshoot the dosing and cause her to feel worse.  Would advise we recheck TSH in 4 weeks and see where we are. Thanks.

## 2015-09-17 ENCOUNTER — Ambulatory Visit: Payer: 59 | Admitting: Family Medicine

## 2015-09-17 NOTE — Telephone Encounter (Signed)
Spoke with pt and explained. She had scheduled appt to discuss today but I had not know this. Plan to stick at current dose and recheck in 4 weeks and shoot for TSH < 2. Please take her off schedule for today. No charge. Thanks.

## 2015-09-17 NOTE — Telephone Encounter (Signed)
I cancelled the appt.

## 2015-09-22 ENCOUNTER — Telehealth: Payer: Self-pay | Admitting: Obstetrics and Gynecology

## 2015-09-22 MED ORDER — OSELTAMIVIR PHOSPHATE 75 MG PO CAPS: 75.0000 mg | ORAL_CAPSULE | Freq: Two times a day (BID) | ORAL | Status: DC

## 2015-09-22 MED FILL — OSELTAMIVIR PHOS 75 MG CAP: 75 | 5 days supply | Qty: 10 | Fill #0

## 2015-09-22 NOTE — Telephone Encounter (Signed)
Patient notified medication sent by Dr Sabra Heck. Encounter closed.

## 2015-09-22 NOTE — Telephone Encounter (Signed)
Rx for Tamiflu sent to pharmacy on file for pt.  I agree this is a good idea to take.

## 2015-09-22 NOTE — Telephone Encounter (Signed)
Patient is scheduled for TAH on 10/06/2015 with Dr.Silva. Patient's son was just diagnosed with the flu. She denies any current symptoms, but is concerned with upcoming surgery. She is requesting rx for Tamiflu. Advised I will speak with the covering provider and return call with further recommendations. She is agreeable.

## 2015-09-22 NOTE — Telephone Encounter (Signed)
Patient called and said, "I am just leaving the pediatrician's office with my son who was diagnosed with the flu.  I am scheduled to have surgery in two weeks and my son's doctor recommended I take Tamiflu.  I just need a prescription for this, if the doctor says it's okay to take."  Pharmacy on file is correct.

## 2015-09-30 ENCOUNTER — Telehealth: Payer: Self-pay | Admitting: Obstetrics and Gynecology

## 2015-09-30 NOTE — Telephone Encounter (Signed)
Spoke with patient regarding surgery benefit questions. Questions answered. Ok to close

## 2015-09-30 NOTE — Telephone Encounter (Signed)
Patient has questions regarding her surgery benefits.

## 2015-10-01 NOTE — Patient Instructions (Addendum)
Your procedure is scheduled on: October 06, 2015   Enter through the Main Entrance of [facility] at: 6:00 am   Pick up the phone at the desk and dial [phone].  Call this number if you have problems the morning of surgery: [phone].  Remember: Do NOT eat food: after midnight on Monday  Do NOT drink clear liquids after: midnight on Monday  Take these medicines the morning of surgery with a SIP OF WATER: Lexapro, Synthroid , and claritin Bring your inhalers with you day of surgery   Do NOT wear jewelry (body piercing), metal hair clips/bobby pins, make-up, or nail polish. Do NOT wear lotions, powders, or perfumes.  You may wear deoderant. Do NOT shave for 48 hours prior to surgery. Do NOT bring valuables to the hospital. Contacts, dentures, or bridgework may not be worn into surgery. Leave suitcase in car.  After surgery it may be brought to your room.  For patients admitted to the hospital, checkout time is 11:00 AM the day of discharge.

## 2015-10-02 ENCOUNTER — Encounter (HOSPITAL_COMMUNITY): Payer: Self-pay

## 2015-10-02 ENCOUNTER — Encounter (HOSPITAL_COMMUNITY)
Admission: RE | Admit: 2015-10-02 | Discharge: 2015-10-02 | Disposition: A | Discharge status: Home / Self Care | Payer: 59 | Source: Ambulatory Visit | Attending: Obstetrics and Gynecology | Admitting: Obstetrics and Gynecology

## 2015-10-02 DIAGNOSIS — Z01812 Encounter for preprocedural laboratory examination: Secondary | ICD-10-CM | POA: Insufficient documentation

## 2015-10-02 HISTORY — DX: Anxiety disorder, unspecified: F41.9

## 2015-10-02 HISTORY — DX: Hypothyroidism, unspecified: E03.9

## 2015-10-02 LAB — BASIC METABOLIC PANEL
ANION GAP: 5 (ref 5–15)
BUN: 13 mg/dL (ref 6–20)
CALCIUM: 8.6 mg/dL — AB (ref 8.9–10.3)
CHLORIDE: 110 mmol/L (ref 101–111)
CO2: 25 mmol/L (ref 22–32)
Creatinine, Ser: 0.78 mg/dL (ref 0.44–1.00)
GFR calc non Af Amer: >60 mL/min (ref >60)
GLUCOSE: 104 mg/dL — AB (ref 65–99)
POTASSIUM: 3.4 mmol/L — AB (ref 3.5–5.1)
Sodium: 140 mmol/L (ref 135–145)

## 2015-10-02 LAB — CBC
HEMATOCRIT: 36.1 % (ref 36.0–46.0)
HEMOGLOBIN: 11.7 g/dL — AB (ref 12.0–15.0)
MCH: 27.5 pg (ref 26.0–34.0)
MCHC: 32.4 g/dL (ref 30.0–36.0)
MCV: 84.9 fL (ref 78.0–100.0)
Platelets: 431 10*3/uL — ABNORMAL HIGH (ref 150–400)
RBC: 4.25 MIL/uL (ref 3.87–5.11)
RDW: 13.6 % (ref 11.5–15.5)
WBC: 6.7 10*3/uL (ref 4.0–10.5)

## 2015-10-05 MED ORDER — DEXTROSE 5 % IV SOLN
2.0000 g | INTRAVENOUS | Status: AC
  Administered 2015-10-06: 2 g via INTRAVENOUS
  Filled 2015-10-05: qty 2

## 2015-10-05 NOTE — H&P (Signed)
Nunzio Cobbs, MD at 09/14/2015 8:19 AM     Status: Signed       Expand All Collapse All   Patient ID: Maria [name], female DOB: April 04, 1971, 45 y.o. MRN: [id number] GYNECOLOGY VISIT  HPI: 45 y.o. Married Caucasian female  G3P3000 with Patient's last menstrual period was 08/29/2015 (exact date).  here for surgical consult. Husband present for the entire visit.  Requesting hysterectomy for menorrhagia and increased cramping during her cycles.  Declines future childbearing. Status post BTL. Hx of Cesarean Section x 3.   Pelvic U/S 07/03/16 -  Uterus - normal. No masses.  EMS - 5.93 mm. Ovaries - normal. Free fluid - no  Unable to do EMB due to cervical stenosis.   08/20/15 - TSH - 3.55 CBC - Hgb 12.6, Platelet 482,000.  GYNECOLOGIC HISTORY: Patient's last menstrual period was 08/29/2015 (exact date). Contraception:Tubal Menopausal hormone therapy: none Last mammogram: 04/17/2015 BIRADS Category 1 Negative. This was a diagnostic right breast mammogram and ultrasound. 11/04/14 - bilateral diagnostic mammogram - BiRADS 2. Benign stable mass of left breast.  Last pap smear: 08-20-15 Neg:Neg HR HPV   OB History    Gravida Para Term Preterm AB TAB SAB Ectopic Multiple Living   3 3 3               Patient Active Problem List   Diagnosis Date Noted  . Breast cyst 11/21/2014  . Menorrhagia 11/21/2014  . Anemia 11/21/2014  . Hypothyroidism 11/21/2014  . Obesity 11/21/2014  . Seasonal allergies 11/21/2014  . Asthma, chronic 11/21/2014  . Generalized anxiety disorder 11/21/2014    Past Medical History  Diagnosis Date  . Thyroid disease   . Asthma   . Generalized anxiety disorder 11/21/2014  . Menorrhagia 11/21/2014    -seeing gyn   . Anemia 11/21/2014    -with elevated platelets   . Obesity 11/21/2014  . Breast cyst 11/21/2014    -L breast, upper L  just off from center -stable per radiology 10/2014   . Seasonal allergies 11/21/2014  . Plantar fasciitis of left foot 11/21/2014    Past Surgical History  Procedure Laterality Date  . Cesarean section    . Tubal ligation      Current Outpatient Prescriptions  Medication Sig Dispense Refill  . albuterol (PROVENTIL HFA;VENTOLIN HFA) 108 (90 BASE) MCG/ACT inhaler Inhale 1 puff into the lungs every 6 (six) hours as needed for wheezing or shortness of breath. 3 Inhaler 0  . escitalopram (LEXAPRO) 20 MG tablet Take 1 tablet (20 mg total) by mouth daily. 90 tablet 1  . fluticasone (FLONASE) 50 MCG/ACT nasal spray Place 1 spray into both nostrils daily.    . Fluticasone-Salmeterol (ADVAIR) 250-50 MCG/DOSE AEPB Inhale 1 puff into the lungs daily as needed (for shortness of breath). 60 each 3  . levothyroxine (SYNTHROID, LEVOTHROID) 75 MCG tablet Take one tablet daily 5 days a week 60 tablet 3  . levothyroxine (SYNTHROID, LEVOTHROID) 88 MCG tablet Take one pill 2 days a week. 8 tablet 3  . loratadine (CLARITIN) 10 MG tablet Take 1 tablet (10 mg total) by mouth daily. 90 tablet 0  . orlistat (XENICAL) 120 MG capsule Take 1 capsule (120 mg total) by mouth 3 (three) times daily with meals. 90 capsule 3   No current facility-administered medications for this visit.     ALLERGIES: Review of patient's allergies indicates no known allergies.  Family History  Problem Relation Age of Onset  . Breast cancer Mother   .  Breast cancer Paternal Grandmother   . Cervical cancer Maternal Grandmother   . Arthritis Maternal Grandmother   . CVA Maternal Grandmother     Social History   Social History  . Marital Status: Married    Spouse Name: N/A  . Number of Children: N/A  . Years of Education: N/A   Occupational History  . Not on file.   Social History Main Topics  . Smoking  status: Former Research scientist (life sciences)  . Smokeless tobacco: Never Used  . Alcohol Use: 0.6 oz/week    1 Glasses of wine per week     Comment: socially  . Drug Use: No  . Sexual Activity:    Partners: Male    Birth Control/ Protection: Surgical     Comment: Tubal   Other Topics Concern  . Not on file   Social History Narrative    ROS: Pertinent items are noted in HPI.  PHYSICAL EXAMINATION:   BP 110/70 mmHg  Pulse 80  Resp 16  Ht 5' (1.524 m)  Wt 208 lb 6.4 oz (94.53 kg)  BMI 40.70 kg/m2  LMP 08/29/2015 (Exact Date)  General appearance: alert, cooperative and appears stated age Head: Normocephalic, without obvious abnormality, atraumatic Neck: no adenopathy, supple, symmetrical, trachea midline and thyroid normal to inspection and palpation Lungs: clear to auscultation bilaterally Heart: regular rate and rhythm Abdomen: Pfannenstiel incision intact, soft, non-tender; bowel sounds normal; no masses, no organomegaly Extremities: extremities normal, atraumatic, no cyanosis or edema Skin: Skin color, texture, turgor normal. No rashes or lesions Lymph nodes: Cervical, supraclavicular, and axillary nodes normal. No abnormal inguinal nodes palpated Neurologic: Grossly normal  Pelvic: External genitalia: no lesions  Urethra: normal appearing urethra with no masses, tenderness or lesions  Bartholins and Skenes: normal   Vagina: normal appearing vagina with normal color and discharge, no lesions  Cervix: no lesions and cervix very small.   Pap taken: No. Bimanual Exam: Uterus: Enlarged 10 - 12 week size.  Adnexa: normal adnexa and no mass, fullness, tenderness    Chaperone was present for exam.  ASSESSMENT  Menorrhagia.  Dysmenorrhea.  Enlarged uterus. No fibroids.  Status post Cesarean Section x 3.  Status post BTL.  Mixed incontinence.  Declines tx for stress incontinence.   PLAN  Proceed with robotic total laproscopic hysterectomy/bilateral salpingectomy/possible bilateral oophorectomy. Risks, benefits, and alternatives have been discussed with the patient who wishes to proceed. She understands that there is the risk of conversion to laparotomy due reasons including but not limited to potential scar tissue, small size of cervix, and intolerance to Trendelenburg.  Surgical procedure and expectations reviewed including incision locations. She states she would like same day discharge from hospital if possible.   An After Visit Summary was printed and given to the patient.  __25____ minutes face to face time of which over 50% was spent in counseling.

## 2015-10-05 NOTE — Anesthesia Preprocedure Evaluation (Addendum)
Anesthesia Evaluation  Patient identified by MRN, date of birth, ID band Patient awake    Reviewed: Allergy & Precautions, NPO status , Patient's Chart, lab work & pertinent test results  Airway Mallampati: III  TM Distance: >3 FB Neck ROM: Full    Dental  (+) Teeth Intact, Dental Advisory Given   Pulmonary asthma , former smoker,    breath sounds clear to auscultation       Cardiovascular  Rhythm:Regular Rate:Normal  3/16 TTE: Normal LV function; grade 1 diastolic dysfunction; trace MR and TR   Neuro/Psych Anxiety negative neurological ROS     GI/Hepatic negative GI ROS, Neg liver ROS,   Endo/Other  Hypothyroidism Morbid obesity  Renal/GU negative Renal ROS     Musculoskeletal negative musculoskeletal ROS (+)   Abdominal   Peds  Hematology  (+) anemia ,   Anesthesia Other Findings   Reproductive/Obstetrics                            Lab Results  Component Value Date   WBC 6.7 10/02/2015   HGB 11.7* 10/02/2015   HCT 36.1 10/02/2015   MCV 84.9 10/02/2015   PLT 431* 10/02/2015   Lab Results  Component Value Date   CREATININE 0.78 10/02/2015   BUN 13 10/02/2015   NA 140 10/02/2015   K 3.4* 10/02/2015   CL 110 10/02/2015   CO2 25 10/02/2015    Anesthesia Physical Anesthesia Plan  ASA: III  Anesthesia Plan: General   Post-op Pain Management:    Induction: Intravenous  Airway Management Planned: Oral ETT  Additional Equipment:   Intra-op Plan:   Post-operative Plan: Extubation in OR  Informed Consent: I have reviewed the patients History and Physical, chart, labs and discussed the procedure including the risks, benefits and alternatives for the proposed anesthesia with the patient or authorized representative who has indicated his/her understanding and acceptance.   Dental advisory given  Plan Discussed with: CRNA  Anesthesia Plan Comments:        Anesthesia  Quick Evaluation

## 2015-10-06 ENCOUNTER — Ambulatory Visit (HOSPITAL_COMMUNITY): Payer: 59 | Admitting: Anesthesiology

## 2015-10-06 ENCOUNTER — Inpatient Hospital Stay (HOSPITAL_COMMUNITY)
Admission: AD | Admit: 2015-10-06 | Discharge: 2015-10-08 | DRG: 742 | Disposition: A | Discharge status: Home / Self Care | Payer: 59 | Source: Ambulatory Visit | Attending: Obstetrics and Gynecology | Admitting: Obstetrics and Gynecology

## 2015-10-06 ENCOUNTER — Encounter (HOSPITAL_COMMUNITY): Payer: Self-pay

## 2015-10-06 ENCOUNTER — Encounter (HOSPITAL_COMMUNITY)
Admission: AD | Disposition: A | Discharge status: Home / Self Care | Payer: Self-pay | Source: Ambulatory Visit | Attending: Obstetrics and Gynecology

## 2015-10-06 DIAGNOSIS — E669 Obesity, unspecified: Secondary | ICD-10-CM | POA: Diagnosis present

## 2015-10-06 DIAGNOSIS — J45909 Unspecified asthma, uncomplicated: Secondary | ICD-10-CM | POA: Diagnosis present

## 2015-10-06 DIAGNOSIS — Z87891 Personal history of nicotine dependence: Secondary | ICD-10-CM

## 2015-10-06 DIAGNOSIS — Z6841 Body Mass Index (BMI) 40.0 and over, adult: Secondary | ICD-10-CM | POA: Diagnosis not present

## 2015-10-06 DIAGNOSIS — N946 Dysmenorrhea, unspecified: Secondary | ICD-10-CM | POA: Diagnosis not present

## 2015-10-06 DIAGNOSIS — N994 Postprocedural pelvic peritoneal adhesions: Secondary | ICD-10-CM | POA: Diagnosis not present

## 2015-10-06 DIAGNOSIS — N3946 Mixed incontinence: Secondary | ICD-10-CM | POA: Diagnosis present

## 2015-10-06 DIAGNOSIS — N920 Excessive and frequent menstruation with regular cycle: Principal | ICD-10-CM | POA: Diagnosis present

## 2015-10-06 DIAGNOSIS — E039 Hypothyroidism, unspecified: Secondary | ICD-10-CM | POA: Diagnosis present

## 2015-10-06 DIAGNOSIS — N882 Stricture and stenosis of cervix uteri: Secondary | ICD-10-CM | POA: Diagnosis not present

## 2015-10-06 DIAGNOSIS — N852 Hypertrophy of uterus: Secondary | ICD-10-CM | POA: Diagnosis not present

## 2015-10-06 DIAGNOSIS — N736 Female pelvic peritoneal adhesions (postinfective): Secondary | ICD-10-CM | POA: Diagnosis not present

## 2015-10-06 DIAGNOSIS — Z9851 Tubal ligation status: Secondary | ICD-10-CM | POA: Diagnosis not present

## 2015-10-06 DIAGNOSIS — E860 Dehydration: Secondary | ICD-10-CM | POA: Diagnosis present

## 2015-10-06 DIAGNOSIS — F411 Generalized anxiety disorder: Secondary | ICD-10-CM | POA: Diagnosis present

## 2015-10-06 DIAGNOSIS — Z79899 Other long term (current) drug therapy: Secondary | ICD-10-CM | POA: Diagnosis not present

## 2015-10-06 DIAGNOSIS — K660 Peritoneal adhesions (postprocedural) (postinfection): Secondary | ICD-10-CM | POA: Diagnosis present

## 2015-10-06 HISTORY — PX: CYSTO: SHX6284

## 2015-10-06 SURGERY — CYSTO
Anesthesia: General | Site: Bladder

## 2015-10-06 MED ORDER — STERILE WATER FOR IRRIGATION IR SOLN
Status: DC | PRN
  Administered 2015-10-06: 1000 mL via INTRAVESICAL

## 2015-10-06 MED ORDER — ROCURONIUM BROMIDE 100 MG/10ML IV SOLN
INTRAVENOUS | Status: DC | PRN
  Administered 2015-10-06: 10 mg via INTRAVENOUS
  Administered 2015-10-06: 50 mg via INTRAVENOUS
  Administered 2015-10-06 (×2): 10 mg via INTRAVENOUS

## 2015-10-06 MED ORDER — HYDROMORPHONE HCL 1 MG/ML IJ SOLN
INTRAMUSCULAR | Status: AC
  Administered 2015-10-06: 0.25 mg via INTRAVENOUS
  Filled 2015-10-06: qty 1

## 2015-10-06 MED ORDER — LIDOCAINE HCL (CARDIAC) 20 MG/ML IV SOLN
INTRAVENOUS | Status: AC
  Filled 2015-10-06: qty 5

## 2015-10-06 MED ORDER — LACTATED RINGERS IV SOLN
INTRAVENOUS | Status: DC
  Administered 2015-10-06 (×3): via INTRAVENOUS

## 2015-10-06 MED ORDER — LACTATED RINGERS IV SOLN: INTRAVENOUS | Status: DC

## 2015-10-06 MED ORDER — NALOXONE HCL 0.4 MG/ML IJ SOLN: 0.4000 mg | INTRAMUSCULAR | Status: DC | PRN

## 2015-10-06 MED ORDER — SCOPOLAMINE 1 MG/3DAYS TD PT72
MEDICATED_PATCH | TRANSDERMAL | Status: AC
  Administered 2015-10-06: 1.5 mg via TRANSDERMAL
  Filled 2015-10-06: qty 1

## 2015-10-06 MED ORDER — FENTANYL CITRATE (PF) 100 MCG/2ML IJ SOLN
INTRAMUSCULAR | Status: DC | PRN
  Administered 2015-10-06 (×2): 50 ug via INTRAVENOUS
  Administered 2015-10-06 (×2): 100 ug via INTRAVENOUS
  Administered 2015-10-06: 50 ug via INTRAVENOUS
  Administered 2015-10-06: 100 ug via INTRAVENOUS
  Administered 2015-10-06: 50 ug via INTRAVENOUS

## 2015-10-06 MED ORDER — HYDROMORPHONE HCL 1 MG/ML IJ SOLN
INTRAMUSCULAR | Status: AC
  Filled 2015-10-06: qty 1

## 2015-10-06 MED ORDER — KETOROLAC TROMETHAMINE 30 MG/ML IJ SOLN
INTRAMUSCULAR | Status: AC
  Filled 2015-10-06: qty 1

## 2015-10-06 MED ORDER — GLYCOPYRROLATE 0.2 MG/ML IJ SOLN
INTRAMUSCULAR | Status: DC | PRN
  Administered 2015-10-06: 0.6 mg via INTRAVENOUS
  Administered 2015-10-06: 0.1 mg via INTRAVENOUS

## 2015-10-06 MED ORDER — MIDAZOLAM HCL 5 MG/5ML IJ SOLN
INTRAMUSCULAR | Status: DC | PRN
  Administered 2015-10-06: 2 mg via INTRAVENOUS

## 2015-10-06 MED ORDER — SODIUM CHLORIDE 0.9% FLUSH: 9.0000 mL | INTRAVENOUS | Status: DC | PRN

## 2015-10-06 MED ORDER — KETOROLAC TROMETHAMINE 30 MG/ML IJ SOLN
INTRAMUSCULAR | Status: DC | PRN
  Administered 2015-10-06: 30 mg via INTRAVENOUS

## 2015-10-06 MED ORDER — PROPOFOL 10 MG/ML IV BOLUS
INTRAVENOUS | Status: AC
  Filled 2015-10-06: qty 20

## 2015-10-06 MED ORDER — ROPIVACAINE HCL 5 MG/ML IJ SOLN
INTRAMUSCULAR | Status: AC
  Filled 2015-10-06: qty 30

## 2015-10-06 MED ORDER — MORPHINE SULFATE 2 MG/ML IV SOLN
INTRAVENOUS | Status: DC
  Administered 2015-10-06: 13:00:00 via INTRAVENOUS
  Filled 2015-10-06: qty 25

## 2015-10-06 MED ORDER — ONDANSETRON HCL 4 MG/2ML IJ SOLN: 4.0000 mg | Freq: Four times a day (QID) | INTRAMUSCULAR | Status: DC | PRN

## 2015-10-06 MED ORDER — DIPHENHYDRAMINE HCL 50 MG/ML IJ SOLN
12.5000 mg | Freq: Four times a day (QID) | INTRAMUSCULAR | Status: DC | PRN
  Administered 2015-10-06: 12.5 mg via INTRAVENOUS
  Filled 2015-10-06: qty 1

## 2015-10-06 MED ORDER — OXYCODONE HCL 5 MG/5ML PO SOLN: 5.0000 mg | Freq: Once | ORAL | Status: DC | PRN

## 2015-10-06 MED ORDER — SODIUM CHLORIDE 0.9 % IJ SOLN
INTRAMUSCULAR | Status: AC
  Filled 2015-10-06: qty 50

## 2015-10-06 MED ORDER — SCOPOLAMINE 1 MG/3DAYS TD PT72
1.0000 | MEDICATED_PATCH | Freq: Once | TRANSDERMAL | Status: DC
  Administered 2015-10-06: 1.5 mg via TRANSDERMAL

## 2015-10-06 MED ORDER — HYDROMORPHONE HCL 1 MG/ML IJ SOLN
0.2500 mg | INTRAMUSCULAR | Status: DC | PRN
  Administered 2015-10-06: 0.25 mg via INTRAVENOUS
  Administered 2015-10-06: 0.5 mg via INTRAVENOUS
  Administered 2015-10-06: 0.25 mg via INTRAVENOUS

## 2015-10-06 MED ORDER — ROCURONIUM BROMIDE 100 MG/10ML IV SOLN
INTRAVENOUS | Status: AC
  Filled 2015-10-06: qty 1

## 2015-10-06 MED ORDER — ONDANSETRON HCL 4 MG/2ML IJ SOLN
INTRAMUSCULAR | Status: AC
  Filled 2015-10-06: qty 2

## 2015-10-06 MED ORDER — ONDANSETRON HCL 4 MG PO TABS: 4.0000 mg | ORAL_TABLET | Freq: Four times a day (QID) | ORAL | Status: DC | PRN

## 2015-10-06 MED ORDER — ONDANSETRON HCL 4 MG/2ML IJ SOLN
INTRAMUSCULAR | Status: DC | PRN
  Administered 2015-10-06: 4 mg via INTRAVENOUS

## 2015-10-06 MED ORDER — MENTHOL 3 MG MT LOZG: 1.0000 | LOZENGE | OROMUCOSAL | Status: DC | PRN

## 2015-10-06 MED ORDER — LACTATED RINGERS IV BOLUS (SEPSIS)
500.0000 mL | Freq: Once | INTRAVENOUS | Status: AC
  Administered 2015-10-06: 500 mL via INTRAVENOUS

## 2015-10-06 MED ORDER — MIDAZOLAM HCL 2 MG/2ML IJ SOLN
INTRAMUSCULAR | Status: AC
  Filled 2015-10-06: qty 2

## 2015-10-06 MED ORDER — NEOSTIGMINE METHYLSULFATE 10 MG/10ML IV SOLN
INTRAVENOUS | Status: DC | PRN
  Administered 2015-10-06: 3 mg via INTRAVENOUS

## 2015-10-06 MED ORDER — FENTANYL CITRATE (PF) 250 MCG/5ML IJ SOLN
INTRAMUSCULAR | Status: AC
  Filled 2015-10-06: qty 5

## 2015-10-06 MED ORDER — LIDOCAINE HCL (CARDIAC) 20 MG/ML IV SOLN
INTRAVENOUS | Status: DC | PRN
  Administered 2015-10-06: 40 mg via INTRAVENOUS

## 2015-10-06 MED ORDER — GLYCOPYRROLATE 0.2 MG/ML IJ SOLN
INTRAMUSCULAR | Status: AC
  Filled 2015-10-06: qty 1

## 2015-10-06 MED ORDER — HYDROMORPHONE 1 MG/ML IV SOLN
INTRAVENOUS | Status: DC
  Administered 2015-10-06: 1.8 mg via INTRAVENOUS
  Administered 2015-10-06: 15:00:00 via INTRAVENOUS
  Administered 2015-10-06: 5.7 mL via INTRAVENOUS
  Administered 2015-10-07: 2.1 mg via INTRAVENOUS
  Filled 2015-10-06: qty 25

## 2015-10-06 MED ORDER — GLYCOPYRROLATE 0.2 MG/ML IJ SOLN
INTRAMUSCULAR | Status: AC
  Filled 2015-10-06: qty 4

## 2015-10-06 MED ORDER — NEOSTIGMINE METHYLSULFATE 10 MG/10ML IV SOLN
INTRAVENOUS | Status: AC
  Filled 2015-10-06: qty 1

## 2015-10-06 MED ORDER — DIPHENHYDRAMINE HCL 12.5 MG/5ML PO ELIX: 12.5000 mg | ORAL_SOLUTION | Freq: Four times a day (QID) | ORAL | Status: DC | PRN

## 2015-10-06 MED ORDER — BUPIVACAINE HCL (PF) 0.25 % IJ SOLN
INTRAMUSCULAR | Status: AC
  Filled 2015-10-06: qty 30

## 2015-10-06 MED ORDER — KETOROLAC TROMETHAMINE 30 MG/ML IJ SOLN
30.0000 mg | Freq: Four times a day (QID) | INTRAMUSCULAR | Status: AC
  Administered 2015-10-06 – 2015-10-07 (×5): 30 mg via INTRAVENOUS
  Filled 2015-10-06 (×4): qty 1

## 2015-10-06 MED ORDER — OXYCODONE HCL 5 MG PO TABS: 5.0000 mg | ORAL_TABLET | Freq: Once | ORAL | Status: DC | PRN

## 2015-10-06 MED ORDER — DEXAMETHASONE SODIUM PHOSPHATE 10 MG/ML IJ SOLN
INTRAMUSCULAR | Status: AC
  Filled 2015-10-06: qty 1

## 2015-10-06 MED ORDER — DEXAMETHASONE SODIUM PHOSPHATE 10 MG/ML IJ SOLN
INTRAMUSCULAR | Status: DC | PRN
  Administered 2015-10-06: 10 mg via INTRAVENOUS

## 2015-10-06 MED ORDER — PROPOFOL 10 MG/ML IV BOLUS
INTRAVENOUS | Status: DC | PRN
  Administered 2015-10-06: 20 mg via INTRAVENOUS
  Administered 2015-10-06: 150 mg via INTRAVENOUS

## 2015-10-06 MED ORDER — PROMETHAZINE HCL 25 MG/ML IJ SOLN: 6.2500 mg | INTRAMUSCULAR | Status: DC | PRN

## 2015-10-06 MED ORDER — OXYCODONE-ACETAMINOPHEN 5-325 MG PO TABS: 1.0000 | ORAL_TABLET | ORAL | Status: DC | PRN

## 2015-10-06 MED ORDER — HYDROMORPHONE HCL 1 MG/ML IJ SOLN
INTRAMUSCULAR | Status: DC | PRN
  Administered 2015-10-06 (×2): 0.5 mg via INTRAVENOUS

## 2015-10-06 MED ORDER — LACTATED RINGERS IV SOLN
INTRAVENOUS | Status: DC
  Administered 2015-10-06 – 2015-10-07 (×3): via INTRAVENOUS

## 2015-10-06 SURGICAL SUPPLY — 70 items
BARRIER ADHS 3X4 INTERCEED (GAUZE/BANDAGES/DRESSINGS) IMPLANT
BENZOIN TINCTURE PRP APPL 2/3 (GAUZE/BANDAGES/DRESSINGS) ×5 IMPLANT
BLADE SURG 10 STRL SS (BLADE) ×15 IMPLANT
CATH FOLEY 3WAY  5CC 16FR (CATHETERS) ×2
CATH FOLEY 3WAY 5CC 16FR (CATHETERS) ×3 IMPLANT
CLOSURE WOUND 1/2 X4 (GAUZE/BANDAGES/DRESSINGS) ×1
CLOTH BEACON ORANGE TIMEOUT ST (SAFETY) ×5 IMPLANT
CONT PATH 16OZ SNAP LID 3702 (MISCELLANEOUS) ×5 IMPLANT
COVER BACK TABLE 60X90IN (DRAPES) ×10 IMPLANT
COVER TIP SHEARS 8 DVNC (MISCELLANEOUS) IMPLANT
COVER TIP SHEARS 8MM DA VINCI (MISCELLANEOUS)
DECANTER SPIKE VIAL GLASS SM (MISCELLANEOUS) ×15 IMPLANT
DILATOR CANAL MILEX (MISCELLANEOUS) ×5 IMPLANT
DRAPE ROBOTICS STRL (DRAPES) IMPLANT
DRSG OPSITE POSTOP 4X10 (GAUZE/BANDAGES/DRESSINGS) ×5 IMPLANT
DURAPREP 26ML APPLICATOR (WOUND CARE) ×5 IMPLANT
ELECT REM PT RETURN 9FT ADLT (ELECTROSURGICAL) ×5
ELECTRODE REM PT RTRN 9FT ADLT (ELECTROSURGICAL) ×3 IMPLANT
GAUZE VASELINE 3X9 (GAUZE/BANDAGES/DRESSINGS) IMPLANT
GLOVE BIO SURGEON STRL SZ 6.5 (GLOVE) ×12 IMPLANT
GLOVE BIO SURGEONS STRL SZ 6.5 (GLOVE) ×3
GLOVE BIOGEL PI IND STRL 6.5 (GLOVE) ×3 IMPLANT
GLOVE BIOGEL PI IND STRL 7.0 (GLOVE) ×9 IMPLANT
GLOVE BIOGEL PI INDICATOR 6.5 (GLOVE) ×2
GLOVE BIOGEL PI INDICATOR 7.0 (GLOVE) ×6
KIT ACCESSORY DA VINCI DISP (KITS)
KIT ACCESSORY DVNC DISP (KITS) IMPLANT
LEGGING LITHOTOMY PAIR STRL (DRAPES) IMPLANT
LIQUID BAND (GAUZE/BANDAGES/DRESSINGS) ×5 IMPLANT
OCCLUDER COLPOPNEUMO (BALLOONS) IMPLANT
PACK ROBOT WH (CUSTOM PROCEDURE TRAY) ×5 IMPLANT
PACK ROBOTIC GOWN (GOWN DISPOSABLE) ×5 IMPLANT
PAD PREP 24X48 CUFFED NSTRL (MISCELLANEOUS) ×10 IMPLANT
PAD TRENDELENBURG POSITION (MISCELLANEOUS) ×5 IMPLANT
RETAINER VISCERAL (MISCELLANEOUS) ×5 IMPLANT
SET BI-LUMEN FLTR TB AIRSEAL (TUBING) IMPLANT
SET CYSTO W/LG BORE CLAMP LF (SET/KITS/TRAYS/PACK) ×10 IMPLANT
SET IRRIG TUBING LAPAROSCOPIC (IRRIGATION / IRRIGATOR) ×5 IMPLANT
SLEEVE XCEL OPT CAN 5 100 (ENDOMECHANICALS) IMPLANT
SPONGE LAP 18X18 X RAY DECT (DISPOSABLE) ×10 IMPLANT
STRIP CLOSURE SKIN 1/2X4 (GAUZE/BANDAGES/DRESSINGS) ×4 IMPLANT
SUT PDS AB 0 CT1 27 (SUTURE) ×10 IMPLANT
SUT PLAIN 2 0 XLH (SUTURE) ×5 IMPLANT
SUT VIC AB 0 CT1 18XCR BRD8 (SUTURE) ×6 IMPLANT
SUT VIC AB 0 CT1 27 (SUTURE) ×6
SUT VIC AB 0 CT1 27XBRD ANBCTR (SUTURE) ×9 IMPLANT
SUT VIC AB 0 CT1 8-18 (SUTURE) ×4
SUT VIC AB 0 CT2 27 (SUTURE) ×10 IMPLANT
SUT VIC AB 2-0 CT1 27 (SUTURE) ×2
SUT VIC AB 2-0 CT1 TAPERPNT 27 (SUTURE) ×3 IMPLANT
SUT VIC AB 4-0 PS2 27 (SUTURE) ×10 IMPLANT
SUT VICRYL 0 TIES 12 18 (SUTURE) ×5 IMPLANT
SUT VICRYL 0 UR6 27IN ABS (SUTURE) ×10 IMPLANT
SUT VICRYL 4-0 PS2 18IN ABS (SUTURE) ×5 IMPLANT
SUT VLOC 180 0 9IN  GS21 (SUTURE)
SUT VLOC 180 0 9IN GS21 (SUTURE) IMPLANT
SYR 50ML LL SCALE MARK (SYRINGE) IMPLANT
SYSTEM CONVERTIBLE TROCAR (TROCAR) IMPLANT
TIP RUMI ORANGE 6.7MMX12CM (TIP) IMPLANT
TIP UTERINE 5.1X6CM LAV DISP (MISCELLANEOUS) IMPLANT
TIP UTERINE 6.7X10CM GRN DISP (MISCELLANEOUS) IMPLANT
TIP UTERINE 6.7X6CM WHT DISP (MISCELLANEOUS) IMPLANT
TIP UTERINE 6.7X8CM BLUE DISP (MISCELLANEOUS) IMPLANT
TOWEL OR 17X24 6PK STRL BLUE (TOWEL DISPOSABLE) ×15 IMPLANT
TROCAR DILATING TIP 12MM 150MM (ENDOMECHANICALS) IMPLANT
TROCAR DISP BLADELESS 8 DVNC (TROCAR) IMPLANT
TROCAR DISP BLADELESS 8MM (TROCAR)
TROCAR XCEL NON-BLD 5MMX100MML (ENDOMECHANICALS) IMPLANT
TUBING INSUFFLATION W/FILTER (TUBING) IMPLANT
WATER STERILE IRR 1000ML POUR (IV SOLUTION) ×5 IMPLANT

## 2015-10-06 NOTE — Op Note (Signed)
Maria [name], Maria [name] NO.:  [id number]  MEDICAL RECORD NO.:  [id number]  LOCATION:  [facility]                          FACILITY:  [facility]  PHYSICIAN:  Lenard Galloway, M.D.   DATE OF BIRTH:  12-28-1970  DATE OF PROCEDURE:  10/06/2015 DATE OF DISCHARGE:                              OPERATIVE REPORT   PROCEDURES:  Total abdominal hysterectomy with bilateral salpingectomy, extensive lysis of adhesions, cystoscopy.  PREOPERATIVE DIAGNOSES:  Menorrhagia, dysmenorrhea, status post cesarean section x 3, status post bilateral tubal ligation.  POSTOPERATIVE DIAGNOSES:  Menorrhagia, dysmenorrhea, status post cesarean section x 3, cervical stenosis, status post bilateral tubal ligation, extensive pelvic adhesions.  SURGEON:  Lenard Galloway, M.D.  ASSISTANT:  Sumner Boast, M.D.  ANESTHESIA:  General endotracheal.  IV FLUIDS:  2500 mL Ringer's lactate.  EBL:  150 mL.  URINE OUTPUT:  75 mL.  COMPLICATIONS:  None.  INDICATIONS FOR THE PROCEDURE:  The patient is a 45 year old gravida 27, para 5, Caucasian female with a history of three prior cesarean sections and bilateral tubal ligation, who presents with heavy menstrual bleeding and increased cramping during her cycles.  The patient had a pelvic ultrasound, which was unremarkable.  Endometrial biopsy in the office setting was not possible due to cervical stenosis.  The patient declines any future childbearing and she wishes to proceed with hysterectomy and removal of the fallopian tubes.  A plan was made to proceed with a robotic total laparoscopic hysterectomy with bilateral salpingectomy and cystoscopy and possible total abdominal hysterectomy approach and possible bilateral oophorectomy.  Risks, benefits, and alternatives were reviewed with the patient who wished to proceed.  FINDINGS:  Exam under anesthesia revealed a slightly enlarged, anteverted mobile uterus.  No adnexal masses were appreciated.  During dilation  of the cervix to place the RUMI device, stenosis of the cervix was encountered and it was not possible to dilate the cervix safely.  At the time of laparotomy, the patient was noted to have extensive adhesions of the omentum to the anterior abdominal wall and to the anterior uterine fundus.  There were also very dense bladder adhesions in the same region.  The fallopian tubes had an appearance consistent with tubal ligation and the ovaries themselves were unremarkable.  The uterus itself appeared to be unremarkable, and the patient had a very long cervix.  In the upper abdomen, the liver and gallbladder were unremarkable.  The right kidney was palpated and felt normal.  The left kidney could not be easily palpated due to adhesions in the left upper quadrant.  The periaortic region was unremarkable. The appendix demonstrated a normal appearance.  There was no evidence of any endometriosis in the abdomen nor in the pelvis.  Cystoscopy at termination of the procedure documented the bladder to be intact.  The bladder was visualized throughout 360 degrees including the bladder dome and trigone.  There was no evidence of a foreign body in the bladder or the urethra.  The ureters were patent bilaterally.  SPECIMENS:  The uterus with cervix and bilateral fallopian tubes were sent to Pathology.  DESCRIPTION OF PROCEDURE:  The patient was reidentified in the preop hold area.  She received cefotetan 2  g IV for antibiotic prophylaxis and she received TED hose and PAS stockings for DVT prophylaxis.  In the operating room, the patient was placed in the dorsal lithotomy position with the Allen stirrups.  Her arms were placed at her sides. General endotracheal anesthesia was induced.  An exam under anesthesia was performed.  The patient's abdomen and vagina were sterilely prepped and she was sterilely draped.  Deaver retractors were placed inside the vagina at this time and a single-tooth tenaculum was  used to grasp the anterior cervical lip.  An attempt was made to sound the uterus, although due to adhesions at the level of the internal os, instrumentation would not extend beyond 4.5 to 5 cm.  Dilation was attempted with different-sized dilators and os finder was also used and the uterus could not be sounded in order to place the RUMI instrument with the KOH ring.  A decision was made to proceed with abdominal hysterectomy.  A 3-way Foley catheter was placed inside the bladder.  Attention was turned to the patient's previous Pfannenstiel incision. An incision was created sharply with a scalpel along the line of this previous incision.  The dissection was carried through the subcutaneous layer using monopolar cautery for hemostasis.  The fascia was identified.  The fascia was scored in the midline with a scalpel.  A Mayo scissors was used to extend the fascial incision bilaterally. Sharp dissection was used to bring the rectus muscles off the fascia superiorly and inferiorly.  The majority of this dissection was performed with a scalpel.  Hemostasis was created with monopolar cautery.  The rectus muscles were gently divided in the midline again using a scalpel.  Dense adhesions were encountered inferiorly in the region of the pyramidalis muscles.  The muscles were dissected apart again using a scalpel.  Entry into the peritoneal cavity was performed after elevating the peritoneum with two hemostat clamps and then using a Metzenbaum scissors.  The omental adhesions were immediately encountered.  The peritoneal incision was extended cranially and very gently extended caudally.  Large pieces of omentum needed to be bisected at this time. Kelly clamps were placed across the omentum, which was then divided using monopolar cautery.  Free ties of 0 Vicryl were used to tie these pedicles.  Eventually, the uterine fundus could be identified.  Severe omental adhesions were completely covering  the anterior uterine fundus and lower uterine segment.  Through slow careful dissection over the course of approximately 1.5 hours, anatomy was restored, which allowed the hysterectomy to be performed.  The adhesions along the anterior uterus were lysed using a scalpel and a Metzenbaum scissors.  Hemostasis was created with monopolar cautery.  The bladder was filled in a retrograde fashion using normal saline to outline the bladder.  The bladder was left with the saline in place as the bladder was dissected off the lower uterine segment.  Eventually, the round ligaments could be identified.  Kelly clamps were placed across the adnexal structures.  The round ligaments were grasped with Babcock clamps, suture ligated with transfixing sutures of 0 Vicryl and bisected using monopolar cautery.  The dissection of the broad ligament was performed using monopolar cautery and sharp dissection. This assisted with the continuation of the bladder dissection off the anterior fundus and lower uterine segment, which was performed sharply as noted above.  Each of the fallopian tubes were accessible at this time.  Kelly clamps were placed between the distal fallopian tube and each of the ovaries. Monopolar cautery was  then used to come through the mesosalpinx.  Free ties of 0 Vicryl were used for these distal pedicles along the fallopian tube dissection.  Each of the fallopian tubes could then be completely excised with monopolar cautery and they were set aside for pathologic analysis.  Each of the utero-ovarian ligaments could be identified at this time. Windows were created to the posterior leaves of the broad ligaments. Each of these pedicles were clamped, sharply divided, and then suture ligated with a free tie of 0 Vicryl followed by a suture ligature of the same.  Hemostasis was good.  The peritoneum could be taken down posteriorly along the uterine arteries at this time.  Each of the uterine  arteries were skeletonized. There was an excellent plane and dissection of the bladder off the lower uterine segment and bladder. The bladder was emptied of the normal saline, and the foley catheter remained in place. The uterine artery pedicles were doubly clamped, sharply divided, and then suture ligated with sutures of 0 Vicryl.  Dissection continued through the inferior aspects of the cardinal ligaments.  Straight Heaney clamps were placed across these pedicles, which were sharply divided and then suture ligated with 0 Vicryl.  The cervix was noted to be very long.  Eventually, the uterosacral ligaments could be clamped with curved Heaney clamps, sharply divided, and then suture ligated with transfixing sutures of 0 Vicryl.  The vagina was entered, and the cervix was circumscribed from the vaginal apex using a Mayo scissors.  The specimen was freed and sent to Pathology along with the fallopian tubes.  Angle sutures were created bilaterally with 0 Vicryl.  The remaining vaginal cuff was closed with a figure-of-eight suture of 0 Vicryl.  The pelvis was irrigated and suctioned.  On each side of the vaginal cuff, there was some minor oozing just lateral to these transfixing sutures of the uterosacral ligaments.  Superficial figure-of-eight sutures were placed bilaterally and then hemostasis was excellent.  There was some oozing along the right anterior bladder near the right vaginal cuff and this responded to monopolar cautery.  All of the upper pedicles were examined at this time and the right round ligament was noted to be somewhat attenuated and oozing slightly and then this was retied with a free tie of 0 Vicryl.  This provided excellent hemostasis.  The three-way Foley catheter was removed and cystoscopy was performed. The findings were as noted above.  All cystoscopic fluid was drained and a 14-French Foley was placed in the bladder and left to gravity drainage.  The abdomen was  closed.  The one lap pad, which had been placed in the peritoneal cavity to retract the bowel into the upper abdomen, was removed.  The parietal peritoneum was closed with a running suture of 2-0 Vicryl. On the lower portion of the incision, there was no peritoneum to close, and so the rectus muscles were reapproximated in the midline with a continuation of the same 2-0 Vicryl. This concealed all bowel inside the peritoneal cavity.  The rectus muscles were then irrigated and suctioned.  Hemostasis was good.  The fascia was closed with a running suture of 0 PDS bilaterally. The subcutaneous layer was irrigated and suctioned and made hemostatic with monopolar cautery.  The subcutaneous layer was undermined slightly inferiorly.  Interrupted sutures of 2-0 plain were placed in the subcutaneous layer.  The subcutaneous layer was closed with interrupted sutures of 2-0 plain.  The skin was closed with a subcuticular suture of 4-0 Vicryl.  Steri-Strips and benzoin were placed over the incision followed by a honeycomb dressing.  The patient was awakened and extubated, and escorted to the recovery room in stable condition.  There were no complications.  All needle, instrument, and sponge counts were correct.     Lenard Galloway, M.D.     BES/MEDQ  D:  10/06/2015  T:  10/06/2015  Job:  [id number]

## 2015-10-06 NOTE — Anesthesia Procedure Notes (Signed)
Procedure Name: Intubation Date/Time: 10/06/2015 7:29 AM Performed by: Ignacia Bayley Pre-anesthesia Checklist: Patient identified, Emergency Drugs available, Suction available and Patient being monitored Patient Re-evaluated:Patient Re-evaluated prior to inductionOxygen Delivery Method: Circle system utilized Preoxygenation: Pre-oxygenation with 100% oxygen Intubation Type: IV induction Ventilation: Mask ventilation without difficulty Laryngoscope Size: Miller and 2 Grade View: Grade II Tube type: Oral Tube size: 7.0 mm Number of attempts: 1 Airway Equipment and Method: Stylet Placement Confirmation: ETT inserted through vocal cords under direct vision,  positive ETCO2 and breath sounds checked- equal and bilateral Secured at: 21 cm Tube secured with: Tape Dental Injury: Teeth and Oropharynx as per pre-operative assessment

## 2015-10-06 NOTE — Anesthesia Postprocedure Evaluation (Signed)
Anesthesia Post Note  Patient: Maria [name]  Procedure(s) Performed: Procedure(s) (LRB): CYSTOSCOPY (N/A) HYSTERECTOMY ABDOMINAL WITH BILATERAL SALPINGECTOMY, EXTENSIVE LYSIS OF ADHESIONS (N/A)  Patient location during evaluation: PACU Anesthesia Type: General Level of consciousness: sedated Pain management: pain level controlled Vital Signs Assessment: post-procedure vital signs reviewed and stable Respiratory status: spontaneous breathing and respiratory function stable Cardiovascular status: stable Anesthetic complications: no    Last Vitals:  Filed Vitals:   10/06/15 1130 10/06/15 1145  BP: 127/71 109/70  Pulse: 104 112  Temp:    Resp: 13 16    Last Pain:  Filed Vitals:   10/06/15 1202  PainSc: Asleep                 Burnell Matlin DANIEL

## 2015-10-06 NOTE — OR Nursing (Signed)
Dr. Quincy Simmonds did not attempt robotic hysterectomy. Switched over to a abdominal hysterectomy at 0800. Husband notified by Sharlyne Pacas, RN per phone at Dr. Elza Rafter request.

## 2015-10-06 NOTE — OR Nursing (Signed)
Husband informed by C.Newnam,RN that scar tissue was encountered per dr. Quincy Simmonds

## 2015-10-06 NOTE — Anesthesia Postprocedure Evaluation (Signed)
Anesthesia Post Note  Patient: Maria [name]  Procedure(s) Performed: Procedure(s) (LRB): CYSTOSCOPY (N/A) HYSTERECTOMY ABDOMINAL WITH BILATERAL SALPINGECTOMY, EXTENSIVE LYSIS OF ADHESIONS (N/A)  Patient location during evaluation: [facility] Anesthesia Type: General Level of consciousness: awake and alert and oriented Pain management: satisfactory to patient Vital Signs Assessment: post-procedure vital signs reviewed and stable Respiratory status: spontaneous breathing, nonlabored ventilation and respiratory function stable Cardiovascular status: stable Postop Assessment: no signs of nausea or vomiting and adequate PO intake Anesthetic complications: no    Last Vitals:  Filed Vitals:   10/06/15 1825 10/06/15 1831  BP: 134/81   Pulse: 133   Temp: 36.7 C   Resp: 18 13    Last Pain:  Filed Vitals:   10/06/15 1833  PainSc: Asleep                 Oral Remache

## 2015-10-06 NOTE — Progress Notes (Signed)
Update to History and Physical  LMP 09/26/15.  Had the flu last week.  Took Tamiflu.  Patient unable to give urine specimen for urine pregnancy test.  Had BTL.   Patient declines pregnancy test today prior to hysterectomy.  She understands that if she is pregnant, she is interrupting a pregnancy.  OK to proceed with robotic total laparoscopic hysterectomy with bilateral salpingectomy and possible bilateral oophorectomy if ovaries abnormal.   Possible total abdominal hysterectomy.

## 2015-10-06 NOTE — Progress Notes (Signed)
Day of Surgery Procedure(s) (LRB): CYSTOSCOPY (N/A) HYSTERECTOMY ABDOMINAL WITH BILATERAL SALPINGECTOMY, EXTENSIVE LYSIS OF ADHESIONS (N/A)  Subjective: Patient reports incisional pain.    Objective: I have reviewed patient's vital signs and intake and output. T max 98.5, t now 97.6, BP 125/82, P 105, RR 10 I/O - 3000 cc IV, 325 cc UO.  General: mild distress and Appears uncomfortable. Resp: clear to auscultation bilaterally Cardio: regular rate and rhythm, S1, S2 normal, no murmur, click, rub or gallop GI: incision: mild blood drainage marked with pen.  and  Soft, incisional tenderness, scant bowel sounds.  Extremities: PAS and Ted hose on.  Vaginal Bleeding: none  Assessment: s/p Procedure(s): CYSTOSCOPY (N/A) HYSTERECTOMY ABDOMINAL WITH BILATERAL SALPINGECTOMY, EXTENSIVE LYSIS OF ADHESIONS (N/A): tachycardia post op.  I am attributing this to post op pain and dehydration from her bowel prep.  Plan: switch to Dilaudid high dose PCA.  Continue Toradol.  Clear liquids.  LR bolus 500 cc over one hour.  Foley overnight.  Surgical findings and procedure reviewed.    LOS: 0 days    Arloa Koh 10/06/2015, 2:41 PM

## 2015-10-06 NOTE — Addendum Note (Signed)
Addendum  created 10/06/15 1931 by Jonna Munro, CRNA   Modules edited: Clinical Notes   Clinical Notes:  File: [id number]

## 2015-10-06 NOTE — Transfer of Care (Signed)
Immediate Anesthesia Transfer of Care Note  Patient: Maria [name]  Procedure(s) Performed: Procedure(s): CYSTOSCOPY (N/A) HYSTERECTOMY ABDOMINAL WITH BILATERAL SALPINGECTOMY, EXTENSIVE LYSIS OF ADHESIONS (N/A)  Patient Location: PACU  Anesthesia Type:General  Level of Consciousness: awake  Airway & Oxygen Therapy: Patient Spontanous Breathing and Patient connected to nasal cannula oxygen  Post-op Assessment: Report given to RN and Post -op Vital signs reviewed and stable  Post vital signs: stable  Last Vitals:  Filed Vitals:   10/06/15 0624 10/06/15 1058  BP: 110/63   Pulse: 83 116  Temp: 36.4 C 36.9 C  Resp: 20 16    Complications: No apparent anesthesia complications

## 2015-10-07 ENCOUNTER — Encounter (HOSPITAL_COMMUNITY): Payer: Self-pay | Admitting: Obstetrics and Gynecology

## 2015-10-07 LAB — BASIC METABOLIC PANEL
ANION GAP: 7 (ref 5–15)
BUN: 7 mg/dL (ref 6–20)
CHLORIDE: 105 mmol/L (ref 101–111)
CO2: 25 mmol/L (ref 22–32)
Calcium: 8.2 mg/dL — ABNORMAL LOW (ref 8.9–10.3)
Creatinine, Ser: 0.71 mg/dL (ref 0.44–1.00)
GFR calc Af Amer: >60 mL/min (ref >60)
GFR calc non Af Amer: >60 mL/min (ref >60)
GLUCOSE: 108 mg/dL — AB (ref 65–99)
POTASSIUM: 3.8 mmol/L (ref 3.5–5.1)
Sodium: 137 mmol/L (ref 135–145)

## 2015-10-07 LAB — CBC
HEMATOCRIT: 29.6 % — AB (ref 36.0–46.0)
Hemoglobin: 9.6 g/dL — ABNORMAL LOW (ref 12.0–15.0)
MCH: 27.5 pg (ref 26.0–34.0)
MCHC: 32.4 g/dL (ref 30.0–36.0)
MCV: 84.8 fL (ref 78.0–100.0)
Platelets: 435 10*3/uL — ABNORMAL HIGH (ref 150–400)
RBC: 3.49 MIL/uL — AB (ref 3.87–5.11)
RDW: 13.7 % (ref 11.5–15.5)
WBC: 13.1 10*3/uL — AB (ref 4.0–10.5)

## 2015-10-07 MED ORDER — MOMETASONE FURO-FORMOTEROL FUM 100-5 MCG/ACT IN AERO
2.0000 | INHALATION_SPRAY | Freq: Two times a day (BID) | RESPIRATORY_TRACT | Status: DC
  Administered 2015-10-07 – 2015-10-08 (×2): 2 via RESPIRATORY_TRACT
  Filled 2015-10-07: qty 8.8

## 2015-10-07 MED ORDER — DIPHENHYDRAMINE HCL 50 MG/ML IJ SOLN
25.0000 mg | Freq: Once | INTRAMUSCULAR | Status: AC
  Administered 2015-10-07: 25 mg via INTRAVENOUS
  Filled 2015-10-07: qty 1

## 2015-10-07 MED ORDER — LEVOTHYROXINE SODIUM 88 MCG PO TABS: 88.0000 ug | ORAL_TABLET | ORAL | Status: DC

## 2015-10-07 MED ORDER — HYDROMORPHONE HCL 2 MG PO TABS
2.0000 mg | ORAL_TABLET | ORAL | Status: DC | PRN
  Administered 2015-10-07 – 2015-10-08 (×7): 2 mg via ORAL
  Filled 2015-10-07 (×7): qty 1

## 2015-10-07 MED ORDER — ALBUTEROL SULFATE (2.5 MG/3ML) 0.083% IN NEBU: 3.0000 mL | INHALATION_SOLUTION | Freq: Four times a day (QID) | RESPIRATORY_TRACT | Status: DC | PRN

## 2015-10-07 MED ORDER — LEVOTHYROXINE SODIUM 75 MCG PO TABS
75.0000 ug | ORAL_TABLET | ORAL | Status: DC
  Administered 2015-10-08: 75 ug via ORAL
  Filled 2015-10-07: qty 1

## 2015-10-07 MED ORDER — ESCITALOPRAM OXALATE 20 MG PO TABS
20.0000 mg | ORAL_TABLET | Freq: Every day | ORAL | Status: DC
  Filled 2015-10-07 (×2): qty 1

## 2015-10-07 MED ORDER — FLUTICASONE PROPIONATE 50 MCG/ACT NA SUSP
1.0000 | Freq: Every day | NASAL | Status: DC
  Administered 2015-10-07: 1 via NASAL
  Filled 2015-10-07: qty 16

## 2015-10-07 MED ORDER — DIPHENHYDRAMINE HCL 25 MG PO CAPS
25.0000 mg | ORAL_CAPSULE | Freq: Four times a day (QID) | ORAL | Status: DC | PRN
  Administered 2015-10-07 – 2015-10-08 (×4): 25 mg via ORAL
  Filled 2015-10-07 (×4): qty 1

## 2015-10-07 MED ORDER — LORATADINE 10 MG PO TABS
10.0000 mg | ORAL_TABLET | Freq: Every day | ORAL | Status: DC
  Administered 2015-10-07: 10 mg via ORAL
  Filled 2015-10-07 (×2): qty 1

## 2015-10-07 NOTE — Progress Notes (Signed)
1 Day Post-Op Procedure(s) (LRB): CYSTOSCOPY (N/A) HYSTERECTOMY ABDOMINAL WITH BILATERAL SALPINGECTOMY, EXTENSIVE LYSIS OF ADHESIONS (N/A)  Subjective: Patient states she feels better than she did after any of her Cesarean Sections.  Patient reports itching.  Out of bed once.  Eating ice.  Had good pain control after switching to Dilaudid PCA.  Objective: I have reviewed patient's vital signs, intake and output and labs. T max 98.5, T now 98.2, BP 126/77, P 104, RR 14. I/O - 5185 cc/2625 cc.  WBC 13.1, Hgb 9.6.  General: alert and cooperative Resp: clear to auscultation bilaterally Cardio: regular rate and rhythm, S1, S2 normal, no murmur, click, rub or gallop GI: soft, non-tender; bowel sounds normal; no masses,  no organomegaly and incision: Dried blood staining of steristrips.  Honeycomb dressing on. Vaginal Bleeding: minimal  Assessment: s/p Procedure(s): CYSTOSCOPY (N/A) HYSTERECTOMY ABDOMINAL WITH BILATERAL SALPINGECTOMY, EXTENSIVE LYSIS OF ADHESIONS (N/A): stable  Plan: Advance diet Encourage ambulation Advance to PO medication Discontinue IV fluids  Will try Dilaudid orally due to patient preference.  Benadryl po for pruritis. CBC and BMP in am. Surgical findings and procedure reviewed with patient.   LOS: 1 day    Maria [name] Matthew Saras 10/07/2015, 7:52 AM

## 2015-10-08 LAB — BASIC METABOLIC PANEL
ANION GAP: 5 (ref 5–15)
BUN: 8 mg/dL (ref 6–20)
CHLORIDE: 107 mmol/L (ref 101–111)
CO2: 27 mmol/L (ref 22–32)
Calcium: 8.1 mg/dL — ABNORMAL LOW (ref 8.9–10.3)
Creatinine, Ser: 0.73 mg/dL (ref 0.44–1.00)
GFR calc Af Amer: >60 mL/min (ref >60)
GLUCOSE: 87 mg/dL (ref 65–99)
POTASSIUM: 3.7 mmol/L (ref 3.5–5.1)
Sodium: 139 mmol/L (ref 135–145)

## 2015-10-08 LAB — CBC
HEMATOCRIT: 28.6 % — AB (ref 36.0–46.0)
HEMOGLOBIN: 9.2 g/dL — AB (ref 12.0–15.0)
MCH: 27.6 pg (ref 26.0–34.0)
MCHC: 32.2 g/dL (ref 30.0–36.0)
MCV: 85.9 fL (ref 78.0–100.0)
PLATELETS: 364 10*3/uL (ref 150–400)
RBC: 3.33 MIL/uL — AB (ref 3.87–5.11)
RDW: 13.9 % (ref 11.5–15.5)
WBC: 10.5 10*3/uL (ref 4.0–10.5)

## 2015-10-08 MED ORDER — HYDROMORPHONE HCL 2 MG PO TABS: 2.0000 mg | ORAL_TABLET | ORAL | Status: DC | PRN

## 2015-10-08 MED FILL — HYDROmorphone HCL 2 MG TABS: 2 | 3 days supply | Qty: 30 | Fill #0

## 2015-10-08 NOTE — Progress Notes (Signed)
2 Days Post-Op Procedure(s) (LRB): CYSTOSCOPY (N/A) HYSTERECTOMY ABDOMINAL WITH BILATERAL SALPINGECTOMY, EXTENSIVE LYSIS OF ADHESIONS (N/A)  Subjective: Patient reports + flatus.   Good pain control with Dilaudid po. Ready to go home.  Objective: I have reviewed patient's vital signs, intake and output, labs and pathology. T max 98.0, T now 98.1, BP 103/67, P 75, RR 16 I/O - 2057/950 cc. WBC 10.5 Hgb 9.2 Pathology - benign cervix, uterus, tubes.  No adenomyosis or endometriosis.  General: cooperative Resp: clear to auscultation bilaterally Cardio: regular rate and rhythm, S1, S2 normal, no murmur, click, rub or gallop GI: soft, non-tender; bowel sounds normal; no masses,  no organomegaly and incision: minimal blood staining.  Steristrips on.  Vaginal Bleeding: none  Assessment: s/p Procedure(s): CYSTOSCOPY (N/A) HYSTERECTOMY ABDOMINAL WITH BILATERAL SALPINGECTOMY, EXTENSIVE LYSIS OF ADHESIONS (N/A): progressing well and ready for discharge to home.  Plan: Discharge home  Rx for Dilaudid and Motrin.  Will take OTC iron daily.  Continue usual medications.  Instructions and precautions given.  Follow up in one week.    LOS: 2 days    Arloa Koh 10/08/2015, 7:56 AM

## 2015-10-08 NOTE — Progress Notes (Signed)
Patient discharged home with husband... Discharge instructions reviewed with patient and she verbalized understanding... Condition stable... No equipment... Taken to car via wheelchair by T. Corbitt, Therapist, sports.

## 2015-10-08 NOTE — Discharge Instructions (Signed)
Abdominal Hysterectomy, Care After °Refer to this sheet in the next few weeks. These instructions provide you with information on caring for yourself after your procedure. Your health care provider may also give you more specific instructions. Your treatment has been planned according to current medical practices, but problems sometimes occur. Call your health care provider if you have any problems or questions after your procedure.  °WHAT TO EXPECT AFTER THE PROCEDURE °After your procedure, it is typical to have the following: °· Pain. °· Feeling tired. °· Poor appetite. °· Less interest in sex. °It takes 4-6 weeks to recover from this surgery.  °HOME CARE INSTRUCTIONS  °· Take pain medicines only as directed by your health care provider. Do not take over-the-counter pain medicines without checking with your health care provider first.  °· Change your bandage as directed by your health care provider. °· Return to your health care provider to have your sutures taken out. °· Take showers instead of baths for 2-3 weeks. Ask your health care provider when it is safe to start showering.  °· Do not douche, use tampons, or have sexual intercourse for at least 6 weeks or until your health care provider says you can.   °· Follow your health care provider's advice about exercise, lifting, driving, and general activities. °· Get plenty of rest and sleep.   °· Do not lift anything heavier than a gallon of milk (about 10 lb [4.5 kg]) for the first month after surgery. °· You can resume your normal diet if your health care provider says it is okay.   °· Do not drink alcohol until your health care provider says you can.   °· If you are constipated, ask your health care provider if you can take a mild laxative. °· Eating foods high in fiber may also help with constipation. Eat plenty of raw fruits and vegetables, whole grains, and beans. °· Drink enough fluids to keep your urine clear or pale yellow.   °· Try to have someone at  home with you for the first 1-2 weeks to help around the house. °· Keep all follow-up appointments. °SEEK MEDICAL CARE IF:  °· You have chills or fever. °· You have swelling, redness, or pain in the area of your incision that is getting worse.   °· You have pus coming from the incision.   °· You notice a bad smell coming from the incision or bandage.   °· Your incision breaks open.   °· You feel dizzy or light-headed.   °· You have pain or bleeding when you urinate.   °· You have persistent diarrhea.   °· You have persistent nausea and vomiting.   °· You have abnormal vaginal discharge.   °· You have a rash.   °· You have any type of abnormal reaction or develop an allergy to your medicine.   °· Your pain medicine is not helping.   °SEEK IMMEDIATE MEDICAL CARE IF:  °· You have a fever and your symptoms suddenly get worse. °· You have severe abdominal pain. °· You have chest pain. °· You have shortness of breath. °· You faint. °· You have pain, swelling, or redness of your leg. °· You have heavy vaginal bleeding with blood clots. °MAKE SURE YOU: °· Understand these instructions. °· Will watch your condition. °· Will get help right away if you are not doing well or get worse. °  °This information is not intended to replace advice given to you by your health care provider. Make sure you discuss any questions you have with your health care provider. °  °Document   Released: 02/18/2005 Document Revised: 08/22/2014 Document Reviewed: 05/24/2013 Elsevier Interactive Patient Education 2016 [place], please resume your Synthroid as you were taking at home.  You did great with your surgery!  Josefa Half, MD

## 2015-10-12 NOTE — Discharge Summary (Signed)
Physician Discharge Summary  Patient ID: Maria [name] MRN: [id number] DOB/AGE: January 16, 1971 45 y.o.  Admit date: 10/06/2015 Discharge date:  10/08/15  Admission Diagnoses: 1.  Menorrhagia 2.  Dysmenorrhea 3.  Status post cesarean section x 3 4.  Status post bilateral tubal ligation  Discharge Diagnoses: 1.  Menorrhagia 2.  Dysmenorrhea 3.  Status post cesarean section x 3 4.  Status post bilateral tubal ligation 5.  Cervical stenosis 6.  Extensive pelvic adhesions 7.  Status post total abdominal hysterectomy with bilateral salpingectomy, extensive lysis of pelvic adhesions, cystoscopy    Active problems:  Status post total abdominal hysterectomy with bilateral salpingectomy, extensive lysis of pelvic adhesions, cystoscopy    Discharged Condition: good  Hospital Course:  The patient was admitted on 10/06/15 for a robotic total laparoscopic hysterectomy with bilateral salpingectomy which was converted immediately to a total abdominal hysterectomy with bilateral salpingectomy, extensive lysis of pelvic adhesions, cystoscopy due to cervical stenosis encountered at the beginning of the case with attempted cervical dilation to place the Rumi device.  The patient's surgical care was performed without complication while under general anesthesia.  She had a morphine PCA and Toradol initially, and this was converted over to a high dose dilaudid PCA for better pain control.  Percocet and Motrin were begun on post op day one when the patient began taking po well.  She ambulated independently and wore PAS and Ted hose for DVT prophylaxis while in bed.  Her foley catheter were removed on post op day one, and she voided good volumes. The patient's vital signs demonstrated tachycardia during the first half of her hospitalization.  This did resolve at discharge.  She demonstrated no signs of infection during her hospitalization.  The patient's post op day one Hgb was 9.6, and her post op day two hemoglobin  was 9.2 .   She was tolerating the this well.  She had very minimal vaginal bleeding, and her incision(s) demonstrated no signs of erythema or significant drainage.  She was found to be in good condition and ready for discharge on post op day two.  Consults: None  Significant Diagnostic Studies: labs:  See Hospital Course.  Treatments: surgery:  Total abdominal hysterectomy with bilateral salpingectomy, extensive lysis of pelvic adhesions, cystoscopy.  Discharge Exam: Blood pressure 103/67, pulse 75, temperature 98.1 F (36.7 C), temperature source Oral, resp. rate 16, height 4\' 11"  (1.499 m), weight 203 lb (92.08 kg), last menstrual period 09/26/2015, SpO2 95 %. General: cooperative Resp: clear to auscultation bilaterally Cardio: regular rate and rhythm, S1, S2 normal, no murmur, click, rub or gallop GI: soft, non-tender; bowel sounds normal; no masses, no organomegaly and incision: minimal blood staining. Steristrips on.  Vaginal Bleeding: none  Disposition: 01-Home or Self Care  Discharge instructions were reviewed in verbal and written form.     Medication List    TAKE these medications        albuterol 108 (90 Base) MCG/ACT inhaler  Commonly known as:  PROVENTIL HFA;VENTOLIN HFA  Inhale 1 puff into the lungs every 6 (six) hours as needed for wheezing or shortness of breath.     escitalopram 20 MG tablet  Commonly known as:  LEXAPRO  Take 1 tablet (20 mg total) by mouth daily.     fluticasone 50 MCG/ACT nasal spray  Commonly known as:  FLONASE  Place 1 spray into both nostrils daily.     Fluticasone-Salmeterol 250-50 MCG/DOSE Aepb  Commonly known as:  ADVAIR  Inhale 1 puff into the  lungs daily as needed (for shortness of breath).     HYDROmorphone 2 MG tablet  Commonly known as:  DILAUDID  Take 1 tablet (2 mg total) by mouth every 3 (three) hours as needed for moderate pain or severe pain.     levothyroxine 88 MCG tablet  Commonly known as:  SYNTHROID, LEVOTHROID   Take one pill 2 days a week.     levothyroxine 75 MCG tablet  Commonly known as:  SYNTHROID, LEVOTHROID  Take one tablet daily 5 days a week     loratadine 10 MG tablet  Commonly known as:  CLARITIN  Take 1 tablet (10 mg total) by mouth daily.     orlistat 120 MG capsule  Commonly known as:  XENICAL  Take 1 capsule (120 mg total) by mouth 3 (three) times daily with meals.     oseltamivir 75 MG capsule  Commonly known as:  TAMIFLU  Take 1 capsule (75 mg total) by mouth 2 (two) times daily. Take BID for 5 days.  Take with food.      She will take her over the counter iron tablet once by mouth daily.     Follow-up Information    Follow up with Arloa Koh, MD.   Specialty:  Obstetrics and Gynecology   Contact information:   [address] [place] [address] [phone]       Follow up On 10/14/2015.      Signed: Arloa Koh 10/12/2015, 7:25 PM

## 2015-10-14 ENCOUNTER — Encounter: Payer: Self-pay | Admitting: Obstetrics and Gynecology

## 2015-10-14 ENCOUNTER — Other Ambulatory Visit: Payer: Self-pay

## 2015-10-14 ENCOUNTER — Ambulatory Visit (INDEPENDENT_AMBULATORY_CARE_PROVIDER_SITE_OTHER): Payer: 59 | Admitting: Obstetrics and Gynecology

## 2015-10-14 VITALS — BP 110/66 | HR 76 | Temp 97.6°F | Ht 60.0 in | Wt 199.0 lb

## 2015-10-14 DIAGNOSIS — Z9889 Other specified postprocedural states: Secondary | ICD-10-CM

## 2015-10-14 DIAGNOSIS — D649 Anemia, unspecified: Secondary | ICD-10-CM

## 2015-10-14 DIAGNOSIS — E038 Other specified hypothyroidism: Secondary | ICD-10-CM | POA: Diagnosis not present

## 2015-10-14 DIAGNOSIS — Z1231 Encounter for screening mammogram for malignant neoplasm of breast: Secondary | ICD-10-CM

## 2015-10-14 LAB — CBC
HCT: 34.8 % — ABNORMAL LOW (ref 36.0–46.0)
Hemoglobin: 11.3 g/dL — ABNORMAL LOW (ref 12.0–15.0)
MCH: 27.2 pg (ref 26.0–34.0)
MCHC: 32.5 g/dL (ref 30.0–36.0)
MCV: 83.7 fL (ref 78.0–100.0)
MPV: 8.9 fL (ref 8.6–12.4)
PLATELETS: 547 10*3/uL — AB (ref 150–400)
RBC: 4.16 MIL/uL (ref 3.87–5.11)
RDW: 13.3 % (ref 11.5–15.5)
WBC: 9 10*3/uL (ref 4.0–10.5)

## 2015-10-14 LAB — THYROID PANEL WITH TSH
Free Thyroxine Index: 2.8 (ref 1.4–3.8)
T3 Uptake: 29 % (ref 22–35)
T4, Total: 9.8 ug/dL (ref 4.5–12.0)
TSH: 0.8 mIU/L

## 2015-10-14 NOTE — Progress Notes (Signed)
GYNECOLOGY  VISIT   HPI: 45 y.o.   Married  Caucasian  female   G3P3000 with Patient's last menstrual period was 09/26/2015 (exact date).   here for   A post op visit.  Status post total abdominal hysterectomy with bilateral salpingectomy, extensive lysis of adhesions, cystoscopy.   Pathology was normal.  No endometriosis or adenomyosis.   Discharge hemoglobin 9.2  Not taking narcotics.  Has normal bladder and bowel function.  No vaginal bleeding.   States the incision looks really good.  Reports that after C/S it usually looked lumpy.   Would like for her thyroid labs to be done as she is due with her PCP.   GYNECOLOGIC HISTORY: Patient's last menstrual period was 09/26/2015 (exact date).          OB History    Gravida Para Term Preterm AB TAB SAB Ectopic Multiple Living   3 3 3                 Patient Active Problem List   Diagnosis Date Noted  . 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 Breast cyst 11/21/2014  . Menorrhagia 11/21/2014  . Anemia 11/21/2014  . Hypothyroidism 11/21/2014  . Obesity 11/21/2014  . Seasonal allergies 11/21/2014  . Asthma, chronic 11/21/2014  . Generalized anxiety disorder 11/21/2014    Past Medical History  Diagnosis Date  . Thyroid disease   . Asthma   . Generalized anxiety disorder 11/21/2014  . Menorrhagia 11/21/2014    -seeing gyn    . Anemia 11/21/2014    -with elevated platelets   . Obesity 11/21/2014  . Breast cyst 11/21/2014    -L breast, upper L just off from center -stable per radiology 10/2014   . Seasonal allergies 11/21/2014  . Plantar fasciitis of left foot 11/21/2014  . Hypothyroidism   . Anxiety     Past Surgical History  Procedure Laterality Date  . Cesarean section    . Tubal ligation    . Cysto N/A 10/06/2015    Procedure: CYSTOSCOPY;  Surgeon: Nunzio Cobbs, MD;  Location: [facility];  Service: Gynecology;  Laterality: N/A;    Current Outpatient Prescriptions   Medication Sig Dispense Refill  . albuterol (PROVENTIL HFA;VENTOLIN HFA) 108 (90 BASE) MCG/ACT inhaler Inhale 1 puff into the lungs every 6 (six) hours as needed for wheezing or shortness of breath. 3 Inhaler 0  . escitalopram (LEXAPRO) 20 MG tablet Take 1 tablet (20 mg total) by mouth daily. 90 tablet 1  . fluticasone (FLONASE) 50 MCG/ACT nasal spray Place 1 spray into both nostrils daily.    . Fluticasone-Salmeterol (ADVAIR) 250-50 MCG/DOSE AEPB Inhale 1 puff into the lungs daily as needed (for shortness of breath). 60 each 3  . levothyroxine (SYNTHROID, LEVOTHROID) 75 MCG tablet Take one tablet daily 5 days a week (Patient taking differently: Take 75 mcg by mouth as directed. Take one tablet daily 5 days a week on Monday, Tuesday, Thursday, Friday, and Saturday.) 60 tablet 3  . levothyroxine (SYNTHROID, LEVOTHROID) 88 MCG tablet Take one pill 2 days a week. (Patient taking differently: Take 88 mcg by mouth as directed. Take one pill 2 days a week on Wednesday and Sunday.) 8 tablet 3  . loratadine (CLARITIN) 10 MG tablet Take 1 tablet (10 mg total) by mouth daily. 90 tablet 0  . orlistat (XENICAL) 120 MG capsule Take 1 capsule (120 mg total) by mouth 3 (three) times daily with meals. 90 capsule 3  No current facility-administered medications for this visit.     ALLERGIES: Review of patient's allergies indicates no known allergies.  Family History  Problem Relation Age of Onset  . Breast cancer Mother   . Breast cancer Paternal Grandmother   . Cervical cancer Maternal Grandmother   . Arthritis Maternal Grandmother   . CVA Maternal Grandmother     Social History   Social History  . Marital Status: Married    Spouse Name: N/A  . Number of Children: N/A  . Years of Education: N/A   Occupational History  . Not on file.   Social History Main Topics  . Smoking status: Former Research scientist (life sciences)  . Smokeless tobacco: Never Used  . Alcohol Use: 0.6 oz/week    1 Glasses of wine per week      Comment: socially  . Drug Use: No  . Sexual Activity:    Partners: Male    Birth Control/ Protection: Surgical     Comment: Tubal   Other Topics Concern  . Not on file   Social History Narrative    ROS:  Pertinent items are noted in HPI.  PHYSICAL EXAMINATION:    BP 110/66 mmHg  Pulse 76  Temp(Src) 97.6 F (36.4 C) (Oral)  Ht 5' (1.524 m)  Wt 199 lb (90.266 kg)  BMI 38.86 kg/m2  LMP 09/26/2015 (Exact Date)    General appearance: alert, cooperative and appears stated age   Abdomen: Pfannenstiel incision intact.  Steristrips are off.  Soft, non-tender; bowel sounds normal; no masses,  no organomegaly   Chaperone was present for exam.  ASSESSMENT  Status post TAH/bilateral salpingectomy, extensive lysis of adhesions, cystoscopy.  Post op anemia.  Hypothyroidism.  PLAN  Continue decreased activity.  Check CBC and TFTs.  PCP managing thyroid care. Follow up for 6 week post op visit.    An After Visit Summary was printed and given to the patient.

## 2015-11-06 ENCOUNTER — Encounter: Payer: Self-pay | Admitting: Obstetrics and Gynecology

## 2015-11-06 ENCOUNTER — Ambulatory Visit: Payer: 59 | Admitting: Obstetrics and Gynecology

## 2015-11-06 ENCOUNTER — Ambulatory Visit (INDEPENDENT_AMBULATORY_CARE_PROVIDER_SITE_OTHER): Payer: 59 | Admitting: Obstetrics and Gynecology

## 2015-11-06 VITALS — BP 120/84 | HR 68 | Wt 193.0 lb

## 2015-11-06 DIAGNOSIS — Z90710 Acquired absence of both cervix and uterus: Secondary | ICD-10-CM

## 2015-11-06 NOTE — Progress Notes (Signed)
Patient ID: Maria [name], female   DOB: 14-May-1971, 45 y.o.   MRN: [id number]  GYNECOLOGY  VISIT   HPI: 45 y.o.   Married  Caucasian  female   G3P3000 with Patient's last menstrual period was 09/26/2015 (exact date).   here for  Post-op visit Status post total abdominal hysterectomy with bilateral salpingectomy, extensive lysis of adhesions, cystoscopy on 10/06/15.   No bleeding.  Normal bladder and bowel function.   Feels great.  Relieved to have the surgery done.  Expresses appreciation for her care.   GYNECOLOGIC HISTORY: Patient's last menstrual period was 09/26/2015 (exact date). Contraception:hsyterectomy Menopausal hormone therapy: none Last mammogram: 11/04/14 3D Bilateral Diagnostic with Right diagnostic and ultrasound 04/17/15; Bi-Rads 1:  Negative, xreening mammogram in one year Last pap smear: 08/20/15, negative with neg HR HPV        OB History    Gravida Para Term Preterm AB TAB SAB Ectopic Multiple Living   3 3 3                 Patient Active Problem List   Diagnosis Date Noted  . 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 Breast cyst 11/21/2014  . Menorrhagia 11/21/2014  . Anemia 11/21/2014  . Hypothyroidism 11/21/2014  . Obesity 11/21/2014  . Seasonal allergies 11/21/2014  . Asthma, chronic 11/21/2014  . Generalized anxiety disorder 11/21/2014    Past Medical History  Diagnosis Date  . Thyroid disease   . Asthma   . Generalized anxiety disorder 11/21/2014  . Menorrhagia 11/21/2014    -seeing gyn    . Anemia 11/21/2014    -with elevated platelets   . Obesity 11/21/2014  . Breast cyst 11/21/2014    -L breast, upper L just off from center -stable per radiology 10/2014   . Seasonal allergies 11/21/2014  . Plantar fasciitis of left foot 11/21/2014  . Hypothyroidism   . Anxiety     Past Surgical History  Procedure Laterality Date  . Cesarean section    . Tubal ligation    . Cysto N/A 10/06/2015    Procedure:  CYSTOSCOPY;  Surgeon: Nunzio Cobbs, MD;  Location: [facility];  Service: Gynecology;  Laterality: N/A;    Current Outpatient Prescriptions  Medication Sig Dispense Refill  . albuterol (PROVENTIL HFA;VENTOLIN HFA) 108 (90 BASE) MCG/ACT inhaler Inhale 1 puff into the lungs every 6 (six) hours as needed for wheezing or shortness of breath. 3 Inhaler 0  . escitalopram (LEXAPRO) 20 MG tablet Take 1 tablet (20 mg total) by mouth daily. 90 tablet 1  . fluticasone (FLONASE) 50 MCG/ACT nasal spray Place 1 spray into both nostrils daily.    . Fluticasone-Salmeterol (ADVAIR) 250-50 MCG/DOSE AEPB Inhale 1 puff into the lungs daily as needed (for shortness of breath). 60 each 3  . levothyroxine (SYNTHROID, LEVOTHROID) 75 MCG tablet Take one tablet daily 5 days a week (Patient taking differently: Take 75 mcg by mouth as directed. Take one tablet daily 5 days a week on Monday, Tuesday, Thursday, Friday, and Saturday.) 60 tablet 3  . levothyroxine (SYNTHROID, LEVOTHROID) 88 MCG tablet Take one pill 2 days a week. (Patient taking differently: Take 88 mcg by mouth as directed. Take one pill 2 days a week on Wednesday and Sunday.) 8 tablet 3  . loratadine (CLARITIN) 10 MG tablet Take 1 tablet (10 mg total) by mouth daily. 90 tablet 0  . orlistat (XENICAL) 120 MG capsule Take 1 capsule (120 mg total) by mouth  3 (three) times daily with meals. 90 capsule 3   No current facility-administered medications for this visit.     ALLERGIES: Review of patient's allergies indicates no known allergies.  Family History  Problem Relation Age of Onset  . Breast cancer Mother   . Breast cancer Paternal Grandmother   . Cervical cancer Maternal Grandmother   . Arthritis Maternal Grandmother   . CVA Maternal Grandmother     Social History   Social History  . Marital Status: Married    Spouse Name: N/A  . Number of Children: N/A  . Years of Education: N/A   Occupational History  . Not on file.   Social  History Main Topics  . Smoking status: Former Research scientist (life sciences)  . Smokeless tobacco: Never Used  . Alcohol Use: 0.6 oz/week    1 Glasses of wine per week     Comment: socially  . Drug Use: No  . Sexual Activity:    Partners: Male    Birth Control/ Protection: Surgical     Comment: Tubal   Other Topics Concern  . Not on file   Social History Narrative    ROS:  Pertinent items are noted in HPI.  PHYSICAL EXAMINATION:    BP 120/84 mmHg  Pulse 68  Wt 193 lb (87.544 kg)  LMP 09/26/2015 (Exact Date)    General appearance: alert, cooperative and appears stated age Abdomen: pfannenstiel incision intact, soft, non-tender;  no masses,  no organomegaly   Pelvic: External genitalia:  no lesions              Urethra:  normal appearing urethra with no masses, tenderness or lesions              Bartholins and Skenes: normal                 Vagina: normal appearing vagina with normal color and discharge, no lesions.  Cuff intact.  No suture seen.               Cervix: absent              Bimanual Exam:  Uterus:  uterus absent              Adnexa: no mass, fullness, tenderness            Chaperone was present for exam.  ASSESSMENT  Status post TAH/bilateral salpingectomy.  Doing well.  PLAN  Counseled regarding return to normal activities in 2 weeks.  Follow up yearly for well woman visits.  Mammogram in April 2017.    An After Visit Summary was printed and given to the patient.

## 2015-11-06 NOTE — Patient Instructions (Signed)
Call if you need anything!  You can return to all normal activities in 2 weeks.  You are healing great from your hysterectomy.

## 2015-11-09 ENCOUNTER — Ambulatory Visit (INDEPENDENT_AMBULATORY_CARE_PROVIDER_SITE_OTHER): Payer: 59 | Admitting: Family Medicine

## 2015-11-09 DIAGNOSIS — R69 Illness, unspecified: Secondary | ICD-10-CM

## 2015-11-09 NOTE — Progress Notes (Signed)
No show

## 2015-11-10 ENCOUNTER — Encounter: Payer: Self-pay | Admitting: Family Medicine

## 2015-11-10 ENCOUNTER — Ambulatory Visit (INDEPENDENT_AMBULATORY_CARE_PROVIDER_SITE_OTHER): Payer: 59 | Admitting: Family Medicine

## 2015-11-10 VITALS — BP 108/72 | HR 90 | Temp 98.1°F | Ht 60.0 in | Wt 191.7 lb

## 2015-11-10 DIAGNOSIS — E669 Obesity, unspecified: Secondary | ICD-10-CM | POA: Diagnosis not present

## 2015-11-10 DIAGNOSIS — D500 Iron deficiency anemia secondary to blood loss (chronic): Secondary | ICD-10-CM | POA: Diagnosis not present

## 2015-11-10 DIAGNOSIS — E038 Other specified hypothyroidism: Secondary | ICD-10-CM | POA: Diagnosis not present

## 2015-11-10 DIAGNOSIS — F411 Generalized anxiety disorder: Secondary | ICD-10-CM

## 2015-11-10 MED ORDER — LEVOTHYROXINE SODIUM 88 MCG PO TABS: ORAL_TABLET | ORAL | Status: DC

## 2015-11-10 NOTE — Progress Notes (Signed)
HPI:  Maria [name] is a pleasant 45 yo here for follow up:  Depression and Anxiety: -chronic, doing great -meds: lexapro; on celexa in the past  Allergies/Asthma: -mes: advair, flonase, claritin, alb -stable  Hypothyroidism: -meds: levothyroxine 75 x5 days, 88 x2 days -wonders if can increase dose further -labs done with gyn recently Lab Results  Component Value Date   TSH 0.80 10/14/2015   Obesity:  -meds: orlistat - started 08/20/15 - not sure why wt done that day not in chart (212lbs 2 days earlier --> 191  -feels this is working well, no sig side effects -exercising and eating better  Menorrhagia with Anemia: -seeing gyn and hematologist -now s/p hysterectomy 10/14/15 - feels much better now that hgb is improving  ROS: See pertinent positives and negatives per HPI.  Past Medical History  Diagnosis Date  . Thyroid disease   . Asthma   . Generalized anxiety disorder 11/21/2014  . Menorrhagia 11/21/2014    -seeing gyn    . Anemia 11/21/2014    -with elevated platelets   . Obesity 11/21/2014  . Breast cyst 11/21/2014    -L breast, upper L just off from center -stable per radiology 10/2014   . Seasonal allergies 11/21/2014  . Plantar fasciitis of left foot 11/21/2014  . Hypothyroidism   . Anxiety     Past Surgical History  Procedure Laterality Date  . Cesarean section    . Tubal ligation    . Cysto N/A 10/06/2015    Procedure: CYSTOSCOPY;  Surgeon: Nunzio Cobbs, MD;  Location: [facility];  Service: Gynecology;  Laterality: N/A;    Family History  Problem Relation Age of Onset  . Breast cancer Mother   . Breast cancer Paternal Grandmother   . Cervical cancer Maternal Grandmother   . Arthritis Maternal Grandmother   . CVA Maternal Grandmother     Social History   Social History  . Marital Status: Married    Spouse Name: N/A  . Number of Children: N/A  . Years of Education: N/A   Social History Main Topics  . Smoking status: Former Research scientist (life sciences)  . Smokeless  tobacco: Never Used  . Alcohol Use: 0.6 oz/week    1 Glasses of wine per week     Comment: socially  . Drug Use: No  . Sexual Activity:    Partners: Male    Birth Control/ Protection: Surgical     Comment: Tubal   Other Topics Concern  . None   Social History Narrative     Current outpatient prescriptions:  .  albuterol (PROVENTIL HFA;VENTOLIN HFA) 108 (90 BASE) MCG/ACT inhaler, Inhale 1 puff into the lungs every 6 (six) hours as needed for wheezing or shortness of breath., Disp: 3 Inhaler, Rfl: 0 .  escitalopram (LEXAPRO) 20 MG tablet, Take 1 tablet (20 mg total) by mouth daily., Disp: 90 tablet, Rfl: 1 .  fluticasone (FLONASE) 50 MCG/ACT nasal spray, Place 1 spray into both nostrils daily., Disp: , Rfl:  .  Fluticasone-Salmeterol (ADVAIR) 250-50 MCG/DOSE AEPB, Inhale 1 puff into the lungs daily as needed (for shortness of breath)., Disp: 60 each, Rfl: 3 .  levothyroxine (SYNTHROID, LEVOTHROID) 75 MCG tablet, Take one tablet daily 5 days a week (Patient taking differently: Take 75 mcg by mouth as directed. Take one tablet daily 5 days a week on Monday, Tuesday, Thursday, Friday, and Saturday.), Disp: 60 tablet, Rfl: 3 .  levothyroxine (SYNTHROID, LEVOTHROID) 88 MCG tablet, Take one pill 2 days a week.,  Disp: 24 tablet, Rfl: 1 .  loratadine (CLARITIN) 10 MG tablet, Take 1 tablet (10 mg total) by mouth daily., Disp: 90 tablet, Rfl: 0 .  orlistat (XENICAL) 120 MG capsule, Take 1 capsule (120 mg total) by mouth 3 (three) times daily with meals., Disp: 90 capsule, Rfl: 3  EXAM:  Filed Vitals:   11/10/15 1124  BP: 108/72  Pulse: 90  Temp: 98.1 F (36.7 C)    Body mass index is 37.44 kg/(m^2).  GENERAL: vitals reviewed and listed above, alert, oriented, appears well hydrated and in no acute distress  HEENT: atraumatic, conjunttiva clear, no obvious abnormalities on inspection of external nose and ears  NECK: no obvious masses on inspection  LUNGS: clear to auscultation  bilaterally, no wheezes, rales or rhonchi, good air movement  CV: HRRR, no peripheral edema  MS: moves all extremities without noticeable abnormality  PSYCH: pleasant and cooperative, no obvious depression or anxiety  ASSESSMENT AND PLAN:  Discussed the following assessment and plan:  Other specified hypothyroidism  Obesity  Generalized anxiety disorder  Iron deficiency anemia due to chronic blood loss  -so happy she is feeling better with increased hgb, thyroid medication adjustments and now healthier lifestyle -continue current dose synthroid, cont current medications -lifestyle recs -Patient advised to return or notify a doctor immediately if symptoms worsen or persist or new concerns arise.  Patient Instructions   Before you leave:  -schedule follow-up in 4-6 months   Please do not increase her thyroid medication dose. Please continue your current dose.  We recommend the following healthy lifestyle measures: - eat a healthy whole foods diet consisting of regular small meals composed of vegetables, fruits, beans, nuts, seeds, healthy meats such as white chicken and fish and whole grains.  - avoid sweets, white starchy foods, fried foods, fast food, processed foods, sodas, red meet and other fattening foods.  - get a least 150-300 minutes of aerobic exercise per week.       Colin Benton R.

## 2015-11-10 NOTE — Progress Notes (Signed)
Pre visit review using our clinic review tool, if applicable. No additional management support is needed unless otherwise documented below in the visit note. 

## 2015-11-10 NOTE — Patient Instructions (Signed)
Before you leave:  -schedule follow-up in 4-6 months   Please do not increase her thyroid medication dose. Please continue your current dose.  We recommend the following healthy lifestyle measures: - eat a healthy whole foods diet consisting of regular small meals composed of vegetables, fruits, beans, nuts, seeds, healthy meats such as white chicken and fish and whole grains.  - avoid sweets, white starchy foods, fried foods, fast food, processed foods, sodas, red meet and other fattening foods.  - get a least 150-300 minutes of aerobic exercise per week.

## 2015-11-12 ENCOUNTER — Ambulatory Visit: Payer: 59 | Admitting: Obstetrics and Gynecology

## 2015-11-13 ENCOUNTER — Ambulatory Visit: Payer: 59 | Admitting: Obstetrics and Gynecology

## 2015-12-03 ENCOUNTER — Ambulatory Visit
Admission: RE | Admit: 2015-12-03 | Discharge: 2015-12-03 | Disposition: A | Discharge status: Home / Self Care | Payer: 59 | Source: Ambulatory Visit

## 2015-12-03 ENCOUNTER — Ambulatory Visit: Payer: 59

## 2015-12-03 DIAGNOSIS — Z1231 Encounter for screening mammogram for malignant neoplasm of breast: Secondary | ICD-10-CM | POA: Diagnosis not present

## 2015-12-07 ENCOUNTER — Other Ambulatory Visit: Payer: Self-pay | Admitting: Obstetrics and Gynecology

## 2015-12-07 DIAGNOSIS — R928 Other abnormal and inconclusive findings on diagnostic imaging of breast: Secondary | ICD-10-CM

## 2015-12-10 ENCOUNTER — Ambulatory Visit: Payer: 59 | Admitting: Family Medicine

## 2015-12-11 ENCOUNTER — Encounter (HOSPITAL_COMMUNITY): Payer: Self-pay

## 2015-12-11 ENCOUNTER — Other Ambulatory Visit: Payer: 59

## 2015-12-11 ENCOUNTER — Emergency Department (HOSPITAL_COMMUNITY)
Admission: EM | Admit: 2015-12-11 | Discharge: 2015-12-11 | Disposition: A | Discharge status: Home / Self Care | Payer: 59 | Attending: Emergency Medicine | Admitting: Emergency Medicine

## 2015-12-11 ENCOUNTER — Emergency Department (HOSPITAL_COMMUNITY): Payer: 59

## 2015-12-11 DIAGNOSIS — Z9889 Other specified postprocedural states: Secondary | ICD-10-CM | POA: Insufficient documentation

## 2015-12-11 DIAGNOSIS — E039 Hypothyroidism, unspecified: Secondary | ICD-10-CM | POA: Diagnosis not present

## 2015-12-11 DIAGNOSIS — Z8742 Personal history of other diseases of the female genital tract: Secondary | ICD-10-CM | POA: Diagnosis not present

## 2015-12-11 DIAGNOSIS — Z79899 Other long term (current) drug therapy: Secondary | ICD-10-CM | POA: Diagnosis not present

## 2015-12-11 DIAGNOSIS — J45909 Unspecified asthma, uncomplicated: Secondary | ICD-10-CM | POA: Insufficient documentation

## 2015-12-11 DIAGNOSIS — K6389 Other specified diseases of intestine: Secondary | ICD-10-CM | POA: Diagnosis not present

## 2015-12-11 DIAGNOSIS — Z87891 Personal history of nicotine dependence: Secondary | ICD-10-CM | POA: Insufficient documentation

## 2015-12-11 DIAGNOSIS — Z9851 Tubal ligation status: Secondary | ICD-10-CM | POA: Diagnosis not present

## 2015-12-11 DIAGNOSIS — E669 Obesity, unspecified: Secondary | ICD-10-CM | POA: Diagnosis not present

## 2015-12-11 DIAGNOSIS — Z8739 Personal history of other diseases of the musculoskeletal system and connective tissue: Secondary | ICD-10-CM | POA: Diagnosis not present

## 2015-12-11 DIAGNOSIS — N390 Urinary tract infection, site not specified: Secondary | ICD-10-CM | POA: Insufficient documentation

## 2015-12-11 DIAGNOSIS — F411 Generalized anxiety disorder: Secondary | ICD-10-CM | POA: Insufficient documentation

## 2015-12-11 DIAGNOSIS — K529 Noninfective gastroenteritis and colitis, unspecified: Secondary | ICD-10-CM

## 2015-12-11 DIAGNOSIS — R109 Unspecified abdominal pain: Secondary | ICD-10-CM | POA: Diagnosis present

## 2015-12-11 DIAGNOSIS — Q438 Other specified congenital malformations of intestine: Secondary | ICD-10-CM | POA: Diagnosis not present

## 2015-12-11 DIAGNOSIS — Z862 Personal history of diseases of the blood and blood-forming organs and certain disorders involving the immune mechanism: Secondary | ICD-10-CM | POA: Insufficient documentation

## 2015-12-11 DIAGNOSIS — N200 Calculus of kidney: Secondary | ICD-10-CM | POA: Diagnosis not present

## 2015-12-11 LAB — CBC
HCT: 35 % — ABNORMAL LOW (ref 36.0–46.0)
Hemoglobin: 11.6 g/dL — ABNORMAL LOW (ref 12.0–15.0)
MCH: 27.7 pg (ref 26.0–34.0)
MCHC: 33.1 g/dL (ref 30.0–36.0)
MCV: 83.5 fL (ref 78.0–100.0)
PLATELETS: 419 10*3/uL — AB (ref 150–400)
RBC: 4.19 MIL/uL (ref 3.87–5.11)
RDW: 14.3 % (ref 11.5–15.5)
WBC: 8 10*3/uL (ref 4.0–10.5)

## 2015-12-11 LAB — COMPREHENSIVE METABOLIC PANEL
ALBUMIN: 3.7 g/dL (ref 3.5–5.0)
ALK PHOS: 85 U/L (ref 38–126)
ALT: 25 U/L (ref 14–54)
ANION GAP: 8 (ref 5–15)
AST: 17 U/L (ref 15–41)
BUN: 12 mg/dL (ref 6–20)
CHLORIDE: 109 mmol/L (ref 101–111)
CO2: 22 mmol/L (ref 22–32)
Calcium: 8.5 mg/dL — ABNORMAL LOW (ref 8.9–10.3)
Creatinine, Ser: 0.68 mg/dL (ref 0.44–1.00)
GFR calc non Af Amer: >60 mL/min (ref >60)
GLUCOSE: 99 mg/dL (ref 65–99)
POTASSIUM: 3.8 mmol/L (ref 3.5–5.1)
SODIUM: 139 mmol/L (ref 135–145)
TOTAL PROTEIN: 6.7 g/dL (ref 6.5–8.1)
Total Bilirubin: 0.4 mg/dL (ref 0.3–1.2)

## 2015-12-11 LAB — URINALYSIS, ROUTINE W REFLEX MICROSCOPIC
BILIRUBIN URINE: NEGATIVE
GLUCOSE, UA: NEGATIVE mg/dL
KETONES UR: NEGATIVE mg/dL
NITRITE: NEGATIVE
PH: 6 (ref 5.0–8.0)
Protein, ur: NEGATIVE mg/dL
SPECIFIC GRAVITY, URINE: 1.022 (ref 1.005–1.030)

## 2015-12-11 LAB — LIPASE, BLOOD: LIPASE: 23 U/L (ref 11–51)

## 2015-12-11 LAB — URINE MICROSCOPIC-ADD ON

## 2015-12-11 MED ORDER — ONDANSETRON HCL 4 MG/2ML IJ SOLN: 4.0000 mg | Freq: Once | INTRAMUSCULAR | Status: DC

## 2015-12-11 MED ORDER — ONDANSETRON HCL 4 MG/2ML IJ SOLN
4.0000 mg | Freq: Once | INTRAMUSCULAR | Status: AC
  Administered 2015-12-11: 4 mg via INTRAVENOUS
  Filled 2015-12-11: qty 2

## 2015-12-11 MED ORDER — OXYCODONE-ACETAMINOPHEN 5-325 MG PO TABS
1.0000 | ORAL_TABLET | ORAL | Status: DC | PRN
Start: 2015-12-11 — End: 2016-01-22

## 2015-12-11 MED ORDER — DEXTROSE 5 % IV SOLN
1.0000 g | Freq: Once | INTRAVENOUS | Status: AC
  Administered 2015-12-11: 1 g via INTRAVENOUS
  Filled 2015-12-11: qty 10

## 2015-12-11 MED ORDER — CEPHALEXIN 500 MG PO CAPS: 500.0000 mg | ORAL_CAPSULE | Freq: Four times a day (QID) | ORAL | Status: DC

## 2015-12-11 MED ORDER — SODIUM CHLORIDE 0.9 % IV BOLUS (SEPSIS)
1000.0000 mL | Freq: Once | INTRAVENOUS | Status: AC
  Administered 2015-12-11: 1000 mL via INTRAVENOUS

## 2015-12-11 MED ORDER — DIATRIZOATE MEGLUMINE & SODIUM 66-10 % PO SOLN
30.0000 mL | Freq: Once | ORAL | Status: AC
  Administered 2015-12-11: 30 mL via ORAL

## 2015-12-11 MED ORDER — SODIUM CHLORIDE 0.9 % IV BOLUS (SEPSIS): 500.0000 mL | Freq: Once | INTRAVENOUS | Status: DC

## 2015-12-11 MED ORDER — HYDROMORPHONE HCL 1 MG/ML IJ SOLN: 1.0000 mg | Freq: Once | INTRAMUSCULAR | Status: DC

## 2015-12-11 MED ORDER — HYDROMORPHONE HCL 1 MG/ML IJ SOLN
1.0000 mg | Freq: Once | INTRAMUSCULAR | Status: AC
  Administered 2015-12-11: 1 mg via INTRAVENOUS
  Filled 2015-12-11: qty 1

## 2015-12-11 MED ORDER — ONDANSETRON HCL 4 MG PO TABS: 4.0000 mg | ORAL_TABLET | Freq: Four times a day (QID) | ORAL | Status: DC

## 2015-12-11 MED ORDER — IOPAMIDOL (ISOVUE-300) INJECTION 61%
100.0000 mL | Freq: Once | INTRAVENOUS | Status: AC | PRN
Start: 2015-12-11 — End: 2015-12-11
  Administered 2015-12-11: 100 mL via INTRAVENOUS

## 2015-12-11 MED ORDER — SODIUM CHLORIDE 0.9 % IV BOLUS (SEPSIS)
500.0000 mL | Freq: Once | INTRAVENOUS | Status: AC
  Administered 2015-12-11: 500 mL via INTRAVENOUS

## 2015-12-11 MED ORDER — KETOROLAC TROMETHAMINE 30 MG/ML IJ SOLN
30.0000 mg | Freq: Once | INTRAMUSCULAR | Status: AC
  Administered 2015-12-11: 30 mg via INTRAVENOUS
  Filled 2015-12-11: qty 1

## 2015-12-11 MED ORDER — ONDANSETRON 4 MG PO TBDP: 4.0000 mg | ORAL_TABLET | Freq: Once | ORAL | Status: DC | PRN

## 2015-12-11 MED FILL — OXYCODONE/APAP 5/325MG: 5-325 | 2 days supply | Qty: 20 | Fill #0

## 2015-12-11 MED FILL — CEPHALEXIN 500 MG CAPSULE: 500 | 5 days supply | Qty: 20 | Fill #0

## 2015-12-11 MED FILL — ONDANSETRON HCL 4 MG TABLET: 4 | 3 days supply | Qty: 12 | Fill #0

## 2015-12-11 NOTE — ED Provider Notes (Signed)
CSN: [id number]     Arrival date & time 12/11/15  0631 History   First MD Initiated Contact with Patient 12/11/15 0715     Chief Complaint  Patient presents with  . Abdominal Pain     (Consider location/radiation/quality/duration/timing/severity/associated sxs/prior Treatment) HPI....Marland KitchenMarland KitchenLeft lateral abdominal pain for approximately 9 days. Symptoms are intermittent and possibly related to positioning. Her stool is occasionally yellow and floating. Otherwise she is reasonably healthy. No fever, sweats, chills, vomiting, nausea, diarrhea. Severity symptoms is moderate.  Past Medical History  Diagnosis Date  . Thyroid disease   . Asthma   . Generalized anxiety disorder 11/21/2014  . Menorrhagia 11/21/2014    -seeing gyn    . Anemia 11/21/2014    -with elevated platelets   . Obesity 11/21/2014  . Breast cyst 11/21/2014    -L breast, upper L just off from center -stable per radiology 10/2014   . Seasonal allergies 11/21/2014  . Plantar fasciitis of left foot 11/21/2014  . Hypothyroidism   . Anxiety    Past Surgical History  Procedure Laterality Date  . Cesarean section    . Tubal ligation    . Cysto N/A 10/06/2015    Procedure: CYSTOSCOPY;  Surgeon: Nunzio Cobbs, MD;  Location: [facility];  Service: Gynecology;  Laterality: N/A;   Family History  Problem Relation Age of Onset  . Breast cancer Mother   . Breast cancer Paternal Grandmother   . Cervical cancer Maternal Grandmother   . Arthritis Maternal Grandmother   . CVA Maternal Grandmother    Social History  Substance Use Topics  . Smoking status: Former Smoker    Types: Cigarettes  . Smokeless tobacco: Never Used  . Alcohol Use: 0.6 oz/week    1 Glasses of wine per week     Comment: socially   OB History    Gravida Para Term Preterm AB TAB SAB Ectopic Multiple Living   3 3 3             Review of Systems  All other systems reviewed and are negative.     Allergies  Review of patient's allergies indicates no known  allergies.  Home Medications   Prior to Admission medications   Medication Sig Start Date End Date Taking? Authorizing Provider  albuterol (PROVENTIL HFA;VENTOLIN HFA) 108 (90 BASE) MCG/ACT inhaler Inhale 1 puff into the lungs every 6 (six) hours as needed for wheezing or shortness of breath. 02/23/15  Yes Lucretia Kern, DO  escitalopram (LEXAPRO) 20 MG tablet Take 1 tablet (20 mg total) by mouth daily. 07/28/15  Yes Lucretia Kern, DO  levothyroxine (SYNTHROID, LEVOTHROID) 75 MCG tablet Take one tablet daily 5 days a week Patient taking differently: Take 75 mcg by mouth as directed. Take one tablet daily 5 days a week on Monday, Tuesday, Thursday, Friday, and Saturday. 08/21/15  Yes Lucretia Kern, DO  levothyroxine (SYNTHROID, LEVOTHROID) 88 MCG tablet Take one pill 2 days a week. 11/10/15  Yes Lucretia Kern, DO  loratadine (CLARITIN) 10 MG tablet Take 1 tablet (10 mg total) by mouth daily. 11/21/14  Yes Lucretia Kern, DO  orlistat (XENICAL) 120 MG capsule Take 1 capsule (120 mg total) by mouth 3 (three) times daily with meals. 08/20/15  Yes Lucretia Kern, DO  cephALEXin (KEFLEX) 500 MG capsule Take 1 capsule (500 mg total) by mouth 4 (four) times daily. 12/11/15   Nat Christen, MD  Fluticasone-Salmeterol (ADVAIR) 250-50 MCG/DOSE AEPB Inhale 1 puff into the  lungs daily as needed (for shortness of breath). Patient not taking: Reported on 12/11/2015 02/23/15   Lucretia Kern, DO  ondansetron (ZOFRAN) 4 MG tablet Take 1 tablet (4 mg total) by mouth every 6 (six) hours. 12/11/15   Nat Christen, MD  oxyCODONE-acetaminophen (PERCOCET) 5-325 MG tablet Take 1-2 tablets by mouth every 4 (four) hours as needed. 12/11/15   Nat Christen, MD   BP 96/53 mmHg  Pulse 62  Temp(Src) 97.9 F (36.6 C) (Oral)  Resp 16  Ht 4\' 11"  (1.499 m)  Wt 180 lb (81.647 kg)  BMI 36.34 kg/m2  SpO2 99%  LMP 09/26/2015 (Exact Date) Physical Exam  Constitutional: She is oriented to person, place, and time.  Obese  HENT:  Head: Normocephalic and  atraumatic.  Eyes: Conjunctivae and EOM are normal. Pupils are equal, round, and reactive to light.  Neck: Normal range of motion. Neck supple.  Cardiovascular: Normal rate and regular rhythm.   Pulmonary/Chest: Effort normal and breath sounds normal.  Abdominal:  Point tenderness on left lateral abdominal wall  Musculoskeletal: Normal range of motion.  Neurological: She is alert and oriented to person, place, and time.  Skin: Skin is warm and dry.  Psychiatric: She has a normal mood and affect. Her behavior is normal.  Nursing note and vitals reviewed.   ED Course  Procedures (including critical care time) Labs Review Labs Reviewed  COMPREHENSIVE METABOLIC PANEL - Abnormal; Notable for the following:    Calcium 8.5 (*)    All other components within normal limits  CBC - Abnormal; Notable for the following:    Hemoglobin 11.6 (*)    HCT 35.0 (*)    Platelets 419 (*)    All other components within normal limits  URINALYSIS, ROUTINE W REFLEX MICROSCOPIC (NOT AT [facility]) - Abnormal; Notable for the following:    APPearance CLOUDY (*)    Hgb urine dipstick MODERATE (*)    Leukocytes, UA TRACE (*)    All other components within normal limits  URINE MICROSCOPIC-ADD ON - Abnormal; Notable for the following:    Squamous Epithelial / LPF 6-30 (*)    Bacteria, UA MANY (*)    All other components within normal limits  URINE CULTURE  LIPASE, BLOOD    Imaging Review Ct Abdomen Pelvis W Contrast  12/11/2015  CLINICAL DATA:  Progressive abdominal pain for 1 week. EXAM: CT ABDOMEN AND PELVIS WITH CONTRAST TECHNIQUE: Multidetector CT imaging of the abdomen and pelvis was performed using the standard protocol following bolus administration of intravenous contrast. CONTRAST:  130mL ISOVUE-300 IOPAMIDOL (ISOVUE-300) INJECTION 61% COMPARISON:  None. FINDINGS: Lower chest:  Normal. Hepatobiliary: Normal. Pancreas: Normal. Spleen: Normal. Adrenals/Urinary Tract: 2 mm stone in the mid left kidney.  Otherwise normal. Stomach/Bowel: There is a focal area of edema in the pericolonic fat adjacent to mid descending colon consistent with an infarcted epiploic appendage. This could be acute. This is at the level of the umbilicus but to the left. The bowel is otherwise normal including the terminal ileum and appendix. No diverticular disease. Vascular/Lymphatic: Normal. Reproductive: Uterus has been removed.  Ovaries are normal. Other: No free air or free fluid. Evidence of previous C-section incision was secondary scarring. Musculoskeletal: Normal. IMPRESSION: 1. Focal area of slight edema in the pericolonic fat in the left mid abdomen consistent with epiploic appendagitis. 2. 2 mm stone in the mid left kidney. 3. Otherwise, normal exam. Electronically Signed   By: Lorriane Shire M.D.   On: 12/11/2015 10:02  I have personally reviewed and evaluated these images and lab results as part of my medical decision-making.   EKG Interpretation None      MDM   Final diagnoses:  Epiploic appendagitis  UTI (lower urinary tract infection)    No acute abdomen. CT scan reveals a focal area of edema in the pericolonic fat in the left midabdomen consistent with epiploic appendages. Additionally there is a 2 mm stone in the left kidney, but none in the ureter. Urinalysis shows evidence of infection. IV fluids, pain management, antibiotics. No acute abdomen at discharge. Discharge medications Percocet, Zofran 8 mg, Keflex 500 mg. Urine culture pending.   Nat Christen, MD 12/11/15 1345

## 2015-12-11 NOTE — Discharge Instructions (Signed)
CT scan shows epiploic appendagitis.   Additionally you have evidence of a urinary tract infection. Medication for pain, nausea, antibiotic. Return for high fever, sweats, chills, difficulty urinating, increased pain, any deteriorating condition.

## 2015-12-11 NOTE — ED Notes (Signed)
Pt ambulate to the BR with steady gait with family.

## 2015-12-11 NOTE — ED Notes (Signed)
Patient c/o abdominal pain x1 week.  Patient states that pain is worse this morning and  woke her from sleep.  Patient advised that stool has been "very loose and yellow, and floats".  Denies blood in stool.  Denies fever.  Denies vomiting.  Denies nausea.  Patient states pain 10/10.

## 2015-12-11 NOTE — ED Notes (Signed)
Off floor for testing 

## 2015-12-13 LAB — URINE CULTURE: Special Requests: NORMAL

## 2015-12-15 ENCOUNTER — Other Ambulatory Visit: Payer: Self-pay | Admitting: Obstetrics and Gynecology

## 2015-12-15 ENCOUNTER — Ambulatory Visit
Admit: 2015-12-15 | Discharge: 2015-12-15 | Disposition: A | Discharge status: Home / Self Care | Payer: 59 | Attending: Obstetrics and Gynecology | Admitting: Obstetrics and Gynecology

## 2015-12-15 ENCOUNTER — Other Ambulatory Visit: Payer: 59

## 2015-12-15 DIAGNOSIS — R928 Other abnormal and inconclusive findings on diagnostic imaging of breast: Secondary | ICD-10-CM

## 2015-12-15 DIAGNOSIS — N6489 Other specified disorders of breast: Secondary | ICD-10-CM | POA: Diagnosis not present

## 2015-12-16 ENCOUNTER — Other Ambulatory Visit: Payer: Self-pay | Admitting: Obstetrics and Gynecology

## 2015-12-16 DIAGNOSIS — R928 Other abnormal and inconclusive findings on diagnostic imaging of breast: Secondary | ICD-10-CM

## 2015-12-17 ENCOUNTER — Ambulatory Visit
Admission: RE | Admit: 2015-12-17 | Discharge: 2015-12-17 | Disposition: A | Discharge status: Home / Self Care | Payer: 59 | Source: Ambulatory Visit | Attending: Obstetrics and Gynecology | Admitting: Obstetrics and Gynecology

## 2015-12-17 ENCOUNTER — Telehealth: Payer: Self-pay | Admitting: Family Medicine

## 2015-12-17 ENCOUNTER — Other Ambulatory Visit: Payer: Self-pay | Admitting: Obstetrics and Gynecology

## 2015-12-17 DIAGNOSIS — R928 Other abnormal and inconclusive findings on diagnostic imaging of breast: Secondary | ICD-10-CM

## 2015-12-17 DIAGNOSIS — N6012 Diffuse cystic mastopathy of left breast: Secondary | ICD-10-CM | POA: Diagnosis not present

## 2015-12-17 DIAGNOSIS — N6489 Other specified disorders of breast: Secondary | ICD-10-CM | POA: Diagnosis not present

## 2015-12-17 MED ORDER — FLUCONAZOLE 150 MG PO TABS: 150.0000 mg | ORAL_TABLET | Freq: Once | ORAL | Status: DC

## 2015-12-17 NOTE — Telephone Encounter (Signed)
Dr Maudie Mercury approved Diflucan to be sent in for the pt.  I called the pt and advised her the Rx was sent to her pharmacy.

## 2015-12-17 NOTE — Telephone Encounter (Signed)
Patient Name: Maria [name]  DOB: [birth year]    Initial Comment Caller states is on ABX and is getting a yeast infection. Wants diflucan called in.    Nurse Assessment  Nurse: Raphael Gibney, RN, Vanita Ingles Date/Time (Eastern Time): 12/17/2015 10:48:32 AM  Confirm and document reason for call. If symptomatic, describe symptoms. You must click the next button to save text entered. ---Caller states she is taking 2000 mg of cephalexin for an infection. Now she has a yeast infection. She has had yeast infections before. She is having vaginal itching and cottage cheese like drainage.  Has the patient traveled out of the country within the last 30 days? ---Not Applicable  Does the patient have any new or worsening symptoms? ---Yes  Will a triage be completed? ---Yes  Related visit to physician within the last 2 weeks? ---No  Does the PT have any chronic conditions? (i.e. diabetes, asthma, etc.) ---No  Is the patient pregnant or possibly pregnant? (Ask all females between the ages of 49-55) ---No  Is this a behavioral health or substance abuse call? ---No     Guidelines    Guideline Title Affirmed Question Affirmed Notes  Vaginal Discharge Symptoms of a vaginal yeast infection (i.e., white, thick, cottage-cheese-like, itchy, not bad smelling discharge)    Final Disposition User   Reynolds, RN, Vanita Ingles    Comments  pt states that OTC medication does not work on her yeast infections and she would like a presscription for Diflucan called in. Please call pt back and let her know if Diflucan will be called in.   Disagree/Comply: Comply

## 2015-12-24 ENCOUNTER — Telehealth: Payer: Self-pay | Admitting: General Practice

## 2015-12-24 MED ORDER — ORLISTAT 120 MG PO CAPS: 120.0000 mg | ORAL_CAPSULE | Freq: Three times a day (TID) | ORAL | Status: DC

## 2015-12-24 NOTE — Telephone Encounter (Signed)
sent 

## 2015-12-29 ENCOUNTER — Other Ambulatory Visit: Payer: Self-pay | Admitting: General Surgery

## 2015-12-29 DIAGNOSIS — F419 Anxiety disorder, unspecified: Secondary | ICD-10-CM | POA: Diagnosis not present

## 2015-12-29 DIAGNOSIS — Z8759 Personal history of other complications of pregnancy, childbirth and the puerperium: Secondary | ICD-10-CM | POA: Diagnosis not present

## 2015-12-29 DIAGNOSIS — Z90710 Acquired absence of both cervix and uterus: Secondary | ICD-10-CM | POA: Diagnosis not present

## 2015-12-29 DIAGNOSIS — Z803 Family history of malignant neoplasm of breast: Secondary | ICD-10-CM | POA: Diagnosis not present

## 2015-12-29 DIAGNOSIS — R928 Other abnormal and inconclusive findings on diagnostic imaging of breast: Secondary | ICD-10-CM | POA: Diagnosis not present

## 2015-12-29 DIAGNOSIS — Z6838 Body mass index (BMI) 38.0-38.9, adult: Secondary | ICD-10-CM | POA: Diagnosis not present

## 2015-12-29 DIAGNOSIS — J45998 Other asthma: Secondary | ICD-10-CM | POA: Diagnosis not present

## 2015-12-30 ENCOUNTER — Telehealth: Payer: Self-pay | Admitting: Family Medicine

## 2015-12-30 NOTE — Telephone Encounter (Signed)
Pharmacy called to advise if OK to change this rx to the OTC ALLI 60 mg   Pharmacy states this is the same med, pt will just need to double up. Ovid Curd at the pharmacy states he can run it through and this will also save her some money. Pt has to pay $75 for this, even if it gets approved. Also, even if  orlistat (XENICAL) 120 MG capsule does get approved, it will need a prior every 3 months.  If we go with the ALLI, they will only have to call every 3 months and ask if ok for pt to continue, and get dr approval.

## 2015-12-30 NOTE — Telephone Encounter (Signed)
Please advise 

## 2015-12-30 NOTE — Telephone Encounter (Signed)
Received a fax from [facility].  Needed pa on Xenical 120 mg capsule to take 1 po tid.  Submitted on CoverMyMeds 12/29/15.  Waiting on response.

## 2015-12-31 NOTE — Telephone Encounter (Signed)
Pt requested rx. If patient is ok with the double dose of OTC and it is cheaper for her then that is great and I would be fine with it. Thanks.

## 2015-12-31 NOTE — Telephone Encounter (Signed)
I called the pharmacy and advised Ovid Curd per the previous note Dr Maudie Mercury stated the pt could use OTC in a double dose and he stated this will cost the pt about the same (cost $73.50) as Xenical but without the prior auth.

## 2015-12-31 NOTE — Telephone Encounter (Signed)
Maria [name] would like to know if pt can have ALLI  Is ok

## 2015-12-31 NOTE — Telephone Encounter (Signed)
Left message for patient notifying her of Dr. Julianne Rice comments. OK per HIPAA. Will route to Dr. Julianne Rice CMA for further follow up if needed.

## 2016-01-13 ENCOUNTER — Other Ambulatory Visit: Payer: Self-pay | Admitting: General Surgery

## 2016-01-13 DIAGNOSIS — R928 Other abnormal and inconclusive findings on diagnostic imaging of breast: Secondary | ICD-10-CM

## 2016-01-15 ENCOUNTER — Encounter (HOSPITAL_BASED_OUTPATIENT_CLINIC_OR_DEPARTMENT_OTHER): Payer: Self-pay | Admitting: *Deleted

## 2016-01-17 NOTE — H&P (Signed)
Maria [name]  Location: [facility] Patient #: [id number] DOB: 06-29-1971 Married / Language: English / Race: White Female       History of Present Illness       This is a 45 year old Caucasian female, referred by Dr. Theda Sers at the breast center of [facility] for evaluation and surgical management of an area of distortion in the left breast, upper inner quadrant, with indeterminate biopsy and strong family history. Dr. Colin Benton is her PCP. Dr. Quincy Simmonds is for a gynecologist.      She has no prior breast problems. She is here with her husband today. Recent screening imaging studies show category C density, a 2 cm area of asymmetry and distortion in the left breast, upper inner quadrant. Image guided biopsies shows fibrocystic changes, adenosis, possible hamartoma, calcifications. Radiologist recommended excision.     Family history reveals mother diagnosed with breast cancer age 65, survived. Paternal grandmother diagnosed with breast cancer in her 2s but did not recur. Maternal great-grandmother had breast cancer details unknown. No one has had genetics. There is no family history of ovarian, colon, or pancreatic cancer.      Comorbidities include BMI 38, history 3 C-sections. She was having trouble with anemia due to heavy menstrual flow but has had a hysterectomy without BSO and that has helped a great deal. She has chronic anxiety on Lexapro. Asthma on inhalers. 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 Denies tobacco. Drinks alcohol rarely.      I explained that this was probably a low risk finding, but because of its appearance on the imaging study and her strong family history that conservative excision of this area was reasonable and she completely agrees and would like to go ahead and have this done and doesn't want to take any risk. She will be scheduled for left breast lumpectomy with radioactive seed localization. I discussed the  indications, details, techniques, and numerous risk of the surgery with her and her husband. She is aware of the risk of bleeding, infection, reoperation if this is cancer, cosmetic deformity, nerve damage with chronic pain, and other unforeseen problems. She understands these issues well. This time all of her questions were answered. She agrees with this plan.   Other Problems  Anxiety Disorder Asthma Gastroesophageal Reflux Disease Hemorrhoids Kidney Stone Lump In Breast Thyroid Disease  Past Surgical History Breast Biopsy Left. Cesarean Section - Multiple Hysterectomy (not due to cancer) - Complete  Diagnostic Studies History  Colonoscopy never Mammogram within last year Pap Smear 1-5 years ago  Allergies  No Known Drug Allergies05/16/2017  Medication History  Escitalopram Oxalate (20MG  Tablet, Oral) Active. Levothyroxine Sodium (75MCG Tablet, Oral 2 days a week) Active. Levothyroxine Sodium (88MCG Tablet, Oral 5 days a week) Active. Ventolin HFA (108 (90 Base)MCG/ACT Aerosol Soln, Inhalation) Active. Xenical (120MG  Capsule, Oral) Active. Loratadine (10MG  Tablet, Oral) Active. Medications Reconciled  Social History  Alcohol use Occasional alcohol use. Caffeine use Carbonated beverages, Tea. No drug use Tobacco use Former smoker.  Family History  Breast Cancer Mother. Thyroid problems Mother.  Pregnancy / Birth History  Age at menarche 57 years. Contraceptive History Oral contraceptives. Gravida 3 Maternal age 77-35 Para 3    Review of Systems  General Present- Night Sweats. Not Present- Appetite Loss, Chills, Fatigue, Fever, Weight Gain and Weight Loss. Skin Not Present- Change in Wart/Mole, Dryness, Hives, Jaundice, New Lesions, Non-Healing Wounds, Rash and Ulcer. HEENT Present- Wears glasses/contact lenses. Not Present- Earache, Hearing Loss,  Hoarseness, Nose Bleed, Oral Ulcers, Ringing in the Ears, Seasonal Allergies,  Sinus Pain, Sore Throat, Visual Disturbances and Yellow Eyes. Respiratory Not Present- Bloody sputum, Chronic Cough, Difficulty Breathing, Snoring and Wheezing. Breast Not Present- Breast Mass, Breast Pain, Nipple Discharge and Skin Changes. Cardiovascular Not Present- Chest Pain, Difficulty Breathing Lying Down, Leg Cramps, Palpitations, Rapid Heart Rate, Shortness of Breath and Swelling of Extremities. Gastrointestinal Present- Abdominal Pain, Bloating, Chronic diarrhea and Constipation. Not Present- Bloody Stool, Change in Bowel Habits, Difficulty Swallowing, Excessive gas, Gets full quickly at meals, Hemorrhoids, Indigestion, Nausea, Rectal Pain and Vomiting. Female Genitourinary Not Present- Frequency, Nocturia, Painful Urination, Pelvic Pain and Urgency. Musculoskeletal Not Present- Back Pain, Joint Pain, Joint Stiffness, Muscle Pain, Muscle Weakness and Swelling of Extremities. Neurological Not Present- Decreased Memory, Fainting, Headaches, Numbness, Seizures, Tingling, Tremor, Trouble walking and Weakness. Psychiatric Present- Anxiety. Not Present- Bipolar, Change in Sleep Pattern, Depression, Fearful and Frequent crying. Endocrine Not Present- Cold Intolerance, Excessive Hunger, Hair Changes, Heat Intolerance, Hot flashes and New Diabetes. Hematology Not Present- Easy Bruising, Excessive bleeding, Gland problems, HIV and Persistent Infections.  Vitals  Weight: 191 lb Height: 59in Body Surface Area: 1.81 m Body Mass Index: 38.58 kg/m  Temp.: 97.47F  Pulse: 67 (Regular)  BP: 118/80 (Sitting, Left Arm, Standard)     Physical Exam General Mental Status-Alert. General Appearance-Consistent with stated age. Hydration-Well hydrated. Voice-Normal. Note: BMI 38   Head and Neck Head-normocephalic, atraumatic with no lesions or palpable masses. Trachea-midline. Thyroid Gland Characteristics - normal size and consistency.  Eye Eyeball -  Bilateral-Extraocular movements intact. Sclera/Conjunctiva - Bilateral-No scleral icterus.  Chest and Lung Exam Chest and lung exam reveals -quiet, even and easy respiratory effort with no use of accessory muscles and on auscultation, normal breath sounds, no adventitious sounds and normal vocal resonance. Inspection Chest Wall - Normal. Back - normal.  Breast Breast - Left-Symmetric, Non Tender, No Biopsy scars, no Dimpling, No Inflammation, No Lumpectomy scars, No Mastectomy scars, No Peau d' Orange. Breast - Right-Symmetric, Non Tender, No Biopsy scars, no Dimpling, No Inflammation, No Lumpectomy scars, No Mastectomy scars, No Peau d' Orange. Breast Lump-No Palpable Breast Mass. Note: Recent biopsy site left breast upper inner quadrant but no hematoma. Perhaps a trace of thickening in the upper inner quadrant but looks very good postbiopsy. No other mass in either breast. No axillary adenopathy.   Cardiovascular Cardiovascular examination reveals -normal heart sounds, regular rate and rhythm with no murmurs and normal pedal pulses bilaterally.  Abdomen Inspection Inspection of the abdomen reveals - No Hernias. Skin - Scar - Note: Surgical scars from her C-section and hysterectomy well-healed. Palpation/Percussion Palpation and Percussion of the abdomen reveal - Soft, Non Tender, No Rebound tenderness, No Rigidity (guarding) and No hepatosplenomegaly. Auscultation Auscultation of the abdomen reveals - Bowel sounds normal.  Neurologic Neurologic evaluation reveals -alert and oriented x 3 with no impairment of recent or remote memory. Mental Status-Normal.  Musculoskeletal Normal Exam - Left-Upper Extremity Strength Normal and Lower Extremity Strength Normal. Normal Exam - Right-Upper Extremity Strength Normal and Lower Extremity Strength Normal.  Lymphatic Head & Neck  General Head & Neck Lymphatics: Bilateral - Description - Normal. Axillary  General  Axillary Region: Bilateral - Description - Normal. Tenderness - Non Tender. Femoral & Inguinal  Generalized Femoral & Inguinal Lymphatics: Bilateral - Description - Normal. Tenderness - Non Tender.    Assessment & Plan  ABNORMAL MAMMOGRAM OF LEFT BREAST (R92.8)   Your recent mammogram show an area of distortion and  asymmetry in the left breast, upper inner quadrant your biopsy shows fibrocystic changes, calcifications, and adenosis. The radiologist stated that they recommended this area be excised Most likely this is benign However, you have a strong family history for breast cancer which increases your risk Because of all these factors we have decided to go ahead with conservative excision of this area to be certain of the diagnosis. We have discussed the indications, techniques and risk of this procedure. Please read the information booklet that we have given you We will try to set this up sometime within the next 3-4 weeks.  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 Dalbert Batman, M.D., [facility] General and Minimally invasive Surgery Breast and Colorectal Surgery Office:   [phone] Pager:   [phone]

## 2016-01-19 ENCOUNTER — Encounter (HOSPITAL_BASED_OUTPATIENT_CLINIC_OR_DEPARTMENT_OTHER)
Admission: RE | Admit: 2016-01-19 | Discharge: 2016-01-19 | Disposition: A | Discharge status: Home / Self Care | Payer: 59 | Source: Ambulatory Visit | Attending: General Surgery | Admitting: General Surgery

## 2016-01-19 DIAGNOSIS — R928 Other abnormal and inconclusive findings on diagnostic imaging of breast: Secondary | ICD-10-CM | POA: Diagnosis present

## 2016-01-19 DIAGNOSIS — Z79899 Other long term (current) drug therapy: Secondary | ICD-10-CM | POA: Diagnosis not present

## 2016-01-19 DIAGNOSIS — N6489 Other specified disorders of breast: Secondary | ICD-10-CM | POA: Diagnosis not present

## 2016-01-19 DIAGNOSIS — E079 Disorder of thyroid, unspecified: Secondary | ICD-10-CM | POA: Diagnosis not present

## 2016-01-19 DIAGNOSIS — Z87891 Personal history of nicotine dependence: Secondary | ICD-10-CM | POA: Diagnosis not present

## 2016-01-19 DIAGNOSIS — F419 Anxiety disorder, unspecified: Secondary | ICD-10-CM | POA: Diagnosis not present

## 2016-01-19 DIAGNOSIS — J45909 Unspecified asthma, uncomplicated: Secondary | ICD-10-CM | POA: Diagnosis not present

## 2016-01-19 DIAGNOSIS — Z803 Family history of malignant neoplasm of breast: Secondary | ICD-10-CM | POA: Diagnosis not present

## 2016-01-19 DIAGNOSIS — Z90710 Acquired absence of both cervix and uterus: Secondary | ICD-10-CM | POA: Diagnosis not present

## 2016-01-19 DIAGNOSIS — N62 Hypertrophy of breast: Secondary | ICD-10-CM | POA: Diagnosis not present

## 2016-01-19 LAB — COMPREHENSIVE METABOLIC PANEL
ALBUMIN: 3.5 g/dL (ref 3.5–5.0)
ALK PHOS: 114 U/L (ref 38–126)
ALT: 176 U/L — AB (ref 14–54)
AST: 86 U/L — ABNORMAL HIGH (ref 15–41)
Anion gap: 6 (ref 5–15)
BUN: 10 mg/dL (ref 6–20)
CALCIUM: 9.1 mg/dL (ref 8.9–10.3)
CHLORIDE: 104 mmol/L (ref 101–111)
CO2: 27 mmol/L (ref 22–32)
CREATININE: 0.75 mg/dL (ref 0.44–1.00)
Glucose, Bld: 110 mg/dL — ABNORMAL HIGH (ref 65–99)
Potassium: 3.9 mmol/L (ref 3.5–5.1)
SODIUM: 137 mmol/L (ref 135–145)
Total Bilirubin: 0.4 mg/dL (ref 0.3–1.2)
Total Protein: 6.3 g/dL — ABNORMAL LOW (ref 6.5–8.1)

## 2016-01-19 LAB — CBC WITH DIFFERENTIAL/PLATELET
BASOS PCT: 0 %
Basophils Absolute: 0 10*3/uL (ref 0.0–0.1)
EOS ABS: 0.3 10*3/uL (ref 0.0–0.7)
EOS PCT: 3 %
HCT: 39 % (ref 36.0–46.0)
HEMOGLOBIN: 12.2 g/dL (ref 12.0–15.0)
Lymphocytes Relative: 18 %
Lymphs Abs: 1.7 10*3/uL (ref 0.7–4.0)
MCH: 26 pg (ref 26.0–34.0)
MCHC: 31.3 g/dL (ref 30.0–36.0)
MCV: 83.2 fL (ref 78.0–100.0)
Monocytes Absolute: 0.4 10*3/uL (ref 0.1–1.0)
Monocytes Relative: 4 %
NEUTROS PCT: 75 %
Neutro Abs: 6.9 10*3/uL (ref 1.7–7.7)
PLATELETS: 395 10*3/uL (ref 150–400)
RBC: 4.69 MIL/uL (ref 3.87–5.11)
RDW: 14.9 % (ref 11.5–15.5)
WBC: 9.2 10*3/uL (ref 4.0–10.5)

## 2016-01-19 NOTE — Pre-Procedure Instructions (Signed)
Boost breeze given and patient instructed to drink by 945 am of surgery

## 2016-01-21 ENCOUNTER — Ambulatory Visit
Admission: RE | Admit: 2016-01-21 | Discharge: 2016-01-21 | Disposition: A | Discharge status: Home / Self Care | Payer: 59 | Source: Ambulatory Visit | Attending: General Surgery | Admitting: General Surgery

## 2016-01-21 DIAGNOSIS — R928 Other abnormal and inconclusive findings on diagnostic imaging of breast: Secondary | ICD-10-CM

## 2016-01-21 DIAGNOSIS — N6489 Other specified disorders of breast: Secondary | ICD-10-CM | POA: Diagnosis not present

## 2016-01-22 ENCOUNTER — Encounter (HOSPITAL_BASED_OUTPATIENT_CLINIC_OR_DEPARTMENT_OTHER)
Admission: RE | Disposition: A | Discharge status: Home / Self Care | Payer: Self-pay | Source: Ambulatory Visit | Attending: General Surgery

## 2016-01-22 ENCOUNTER — Ambulatory Visit
Admission: RE | Admit: 2016-01-22 | Discharge: 2016-01-22 | Disposition: A | Discharge status: Home / Self Care | Payer: 59 | Source: Ambulatory Visit | Attending: General Surgery | Admitting: General Surgery

## 2016-01-22 ENCOUNTER — Ambulatory Visit (HOSPITAL_BASED_OUTPATIENT_CLINIC_OR_DEPARTMENT_OTHER): Payer: 59 | Admitting: Anesthesiology

## 2016-01-22 ENCOUNTER — Encounter (HOSPITAL_BASED_OUTPATIENT_CLINIC_OR_DEPARTMENT_OTHER): Payer: Self-pay | Admitting: *Deleted

## 2016-01-22 ENCOUNTER — Ambulatory Visit (HOSPITAL_BASED_OUTPATIENT_CLINIC_OR_DEPARTMENT_OTHER)
Admission: RE | Admit: 2016-01-22 | Discharge: 2016-01-22 | Disposition: A | Discharge status: Home / Self Care | Payer: 59 | Source: Ambulatory Visit | Attending: General Surgery | Admitting: General Surgery

## 2016-01-22 DIAGNOSIS — N6012 Diffuse cystic mastopathy of left breast: Secondary | ICD-10-CM | POA: Diagnosis not present

## 2016-01-22 DIAGNOSIS — N63 Unspecified lump in breast: Secondary | ICD-10-CM | POA: Diagnosis not present

## 2016-01-22 DIAGNOSIS — E079 Disorder of thyroid, unspecified: Secondary | ICD-10-CM | POA: Diagnosis not present

## 2016-01-22 DIAGNOSIS — Z90710 Acquired absence of both cervix and uterus: Secondary | ICD-10-CM | POA: Insufficient documentation

## 2016-01-22 DIAGNOSIS — J45909 Unspecified asthma, uncomplicated: Secondary | ICD-10-CM | POA: Diagnosis not present

## 2016-01-22 DIAGNOSIS — F419 Anxiety disorder, unspecified: Secondary | ICD-10-CM | POA: Insufficient documentation

## 2016-01-22 DIAGNOSIS — N6489 Other specified disorders of breast: Secondary | ICD-10-CM | POA: Diagnosis not present

## 2016-01-22 DIAGNOSIS — N62 Hypertrophy of breast: Secondary | ICD-10-CM | POA: Diagnosis not present

## 2016-01-22 DIAGNOSIS — Z79899 Other long term (current) drug therapy: Secondary | ICD-10-CM | POA: Diagnosis not present

## 2016-01-22 DIAGNOSIS — Z87891 Personal history of nicotine dependence: Secondary | ICD-10-CM | POA: Diagnosis not present

## 2016-01-22 DIAGNOSIS — R928 Other abnormal and inconclusive findings on diagnostic imaging of breast: Secondary | ICD-10-CM

## 2016-01-22 DIAGNOSIS — R921 Mammographic calcification found on diagnostic imaging of breast: Secondary | ICD-10-CM | POA: Diagnosis not present

## 2016-01-22 DIAGNOSIS — Z803 Family history of malignant neoplasm of breast: Secondary | ICD-10-CM | POA: Diagnosis not present

## 2016-01-22 DIAGNOSIS — D242 Benign neoplasm of left breast: Secondary | ICD-10-CM | POA: Diagnosis not present

## 2016-01-22 HISTORY — PX: BREAST LUMPECTOMY WITH RADIOACTIVE SEED LOCALIZATION: SHX6424

## 2016-01-22 HISTORY — DX: Other abnormal and inconclusive findings on diagnostic imaging of breast: R92.8

## 2016-01-22 SURGERY — BREAST LUMPECTOMY WITH RADIOACTIVE SEED LOCALIZATION
Anesthesia: General | Laterality: Left

## 2016-01-22 MED ORDER — SCOPOLAMINE 1 MG/3DAYS TD PT72: 1.0000 | MEDICATED_PATCH | Freq: Once | TRANSDERMAL | Status: DC | PRN

## 2016-01-22 MED ORDER — MIDAZOLAM HCL 2 MG/2ML IJ SOLN
INTRAMUSCULAR | Status: AC
  Filled 2016-01-22: qty 2

## 2016-01-22 MED ORDER — CHLORHEXIDINE GLUCONATE 4 % EX LIQD: 1.0000 "application " | Freq: Once | CUTANEOUS | Status: DC

## 2016-01-22 MED ORDER — PROPOFOL 10 MG/ML IV BOLUS
INTRAVENOUS | Status: DC | PRN
  Administered 2016-01-22 (×2): 150 mg via INTRAVENOUS

## 2016-01-22 MED ORDER — HYDROCODONE-ACETAMINOPHEN 5-325 MG PO TABS: 1.0000 | ORAL_TABLET | Freq: Four times a day (QID) | ORAL | Status: DC | PRN

## 2016-01-22 MED ORDER — HYDROMORPHONE HCL 1 MG/ML IJ SOLN
0.2500 mg | INTRAMUSCULAR | Status: DC | PRN
  Administered 2016-01-22: 0.5 mg via INTRAVENOUS

## 2016-01-22 MED ORDER — FENTANYL CITRATE (PF) 100 MCG/2ML IJ SOLN
INTRAMUSCULAR | Status: AC
  Filled 2016-01-22: qty 2

## 2016-01-22 MED ORDER — BUPIVACAINE-EPINEPHRINE (PF) 0.5% -1:200000 IJ SOLN
INTRAMUSCULAR | Status: AC
  Filled 2016-01-22: qty 30

## 2016-01-22 MED ORDER — FENTANYL CITRATE (PF) 100 MCG/2ML IJ SOLN
50.0000 ug | INTRAMUSCULAR | Status: DC | PRN
  Administered 2016-01-22: 100 ug via INTRAVENOUS

## 2016-01-22 MED ORDER — LACTATED RINGERS IV SOLN
INTRAVENOUS | Status: DC
  Administered 2016-01-22: 12:00:00 via INTRAVENOUS

## 2016-01-22 MED ORDER — LIDOCAINE HCL (CARDIAC) 20 MG/ML IV SOLN
INTRAVENOUS | Status: DC | PRN
  Administered 2016-01-22: 50 mg via INTRAVENOUS

## 2016-01-22 MED ORDER — MIDAZOLAM HCL 2 MG/2ML IJ SOLN
1.0000 mg | INTRAMUSCULAR | Status: DC | PRN
  Administered 2016-01-22: 2 mg via INTRAVENOUS

## 2016-01-22 MED ORDER — ONDANSETRON HCL 4 MG/2ML IJ SOLN: 4.0000 mg | Freq: Once | INTRAMUSCULAR | Status: DC | PRN

## 2016-01-22 MED ORDER — MEPERIDINE HCL 25 MG/ML IJ SOLN: 6.2500 mg | INTRAMUSCULAR | Status: DC | PRN

## 2016-01-22 MED ORDER — BUPIVACAINE-EPINEPHRINE (PF) 0.5% -1:200000 IJ SOLN
INTRAMUSCULAR | Status: DC | PRN
  Administered 2016-01-22: 8 mL

## 2016-01-22 MED ORDER — ONDANSETRON HCL 4 MG/2ML IJ SOLN
INTRAMUSCULAR | Status: DC | PRN
  Administered 2016-01-22: 4 mg via INTRAVENOUS

## 2016-01-22 MED ORDER — HYDROMORPHONE HCL 1 MG/ML IJ SOLN
INTRAMUSCULAR | Status: AC
  Filled 2016-01-22: qty 1

## 2016-01-22 MED ORDER — CEFAZOLIN SODIUM-DEXTROSE 2-4 GM/100ML-% IV SOLN
INTRAVENOUS | Status: AC
  Filled 2016-01-22: qty 100

## 2016-01-22 MED ORDER — GLYCOPYRROLATE 0.2 MG/ML IJ SOLN: 0.2000 mg | Freq: Once | INTRAMUSCULAR | Status: DC | PRN

## 2016-01-22 MED ORDER — DEXAMETHASONE SODIUM PHOSPHATE 4 MG/ML IJ SOLN
INTRAMUSCULAR | Status: DC | PRN
  Administered 2016-01-22: 10 mg via INTRAVENOUS

## 2016-01-22 MED ORDER — CEFAZOLIN SODIUM-DEXTROSE 2-4 GM/100ML-% IV SOLN
2.0000 g | INTRAVENOUS | Status: AC
  Administered 2016-01-22: 2 g via INTRAVENOUS

## 2016-01-22 SURGICAL SUPPLY — 59 items
APPLIER CLIP 9.375 MED OPEN (MISCELLANEOUS)
BENZOIN TINCTURE PRP APPL 2/3 (GAUZE/BANDAGES/DRESSINGS) IMPLANT
BINDER BREAST LRG (GAUZE/BANDAGES/DRESSINGS) IMPLANT
BINDER BREAST MEDIUM (GAUZE/BANDAGES/DRESSINGS) IMPLANT
BINDER BREAST XLRG (GAUZE/BANDAGES/DRESSINGS) IMPLANT
BINDER BREAST XXLRG (GAUZE/BANDAGES/DRESSINGS) IMPLANT
BLADE HEX COATED 2.75 (ELECTRODE) ×3 IMPLANT
BLADE SURG 10 STRL SS (BLADE) IMPLANT
BLADE SURG 15 STRL LF DISP TIS (BLADE) ×1 IMPLANT
BLADE SURG 15 STRL SS (BLADE) ×2
CANISTER SUC SOCK COL 7IN (MISCELLANEOUS) IMPLANT
CANISTER SUCT 1200ML W/VALVE (MISCELLANEOUS) ×3 IMPLANT
CHLORAPREP W/TINT 26ML (MISCELLANEOUS) ×3 IMPLANT
CLIP APPLIE 9.375 MED OPEN (MISCELLANEOUS) IMPLANT
CLOSURE WOUND 1/2 X4 (GAUZE/BANDAGES/DRESSINGS)
COVER BACK TABLE 60X90IN (DRAPES) ×3 IMPLANT
COVER MAYO STAND STRL (DRAPES) ×3 IMPLANT
COVER PROBE W GEL 5X96 (DRAPES) ×3 IMPLANT
DECANTER SPIKE VIAL GLASS SM (MISCELLANEOUS) IMPLANT
DERMABOND ADVANCED (GAUZE/BANDAGES/DRESSINGS) ×2
DERMABOND ADVANCED .7 DNX12 (GAUZE/BANDAGES/DRESSINGS) ×1 IMPLANT
DEVICE DUBIN W/COMP PLATE 8390 (MISCELLANEOUS) ×3 IMPLANT
DRAPE LAPAROSCOPIC ABDOMINAL (DRAPES) ×3 IMPLANT
DRAPE UTILITY XL STRL (DRAPES) ×3 IMPLANT
DRSG PAD ABDOMINAL 8X10 ST (GAUZE/BANDAGES/DRESSINGS) IMPLANT
ELECT REM PT RETURN 9FT ADLT (ELECTROSURGICAL) ×3
ELECTRODE REM PT RTRN 9FT ADLT (ELECTROSURGICAL) ×1 IMPLANT
GLOVE EUDERMIC 7 POWDERFREE (GLOVE) ×3 IMPLANT
GOWN STRL REUS W/ TWL LRG LVL3 (GOWN DISPOSABLE) ×1 IMPLANT
GOWN STRL REUS W/ TWL XL LVL3 (GOWN DISPOSABLE) ×1 IMPLANT
GOWN STRL REUS W/TWL LRG LVL3 (GOWN DISPOSABLE) ×2
GOWN STRL REUS W/TWL XL LVL3 (GOWN DISPOSABLE) ×2
ILLUMINATOR WAVEGUIDE N/F (MISCELLANEOUS) IMPLANT
KIT MARKER MARGIN INK (KITS) ×3 IMPLANT
LIGHT WAVEGUIDE WIDE FLAT (MISCELLANEOUS) IMPLANT
NEEDLE HYPO 25X1 1.5 SAFETY (NEEDLE) ×3 IMPLANT
NS IRRIG 1000ML POUR BTL (IV SOLUTION) ×3 IMPLANT
PACK BASIN DAY SURGERY FS (CUSTOM PROCEDURE TRAY) ×3 IMPLANT
PENCIL BUTTON HOLSTER BLD 10FT (ELECTRODE) ×3 IMPLANT
SHEET MEDIUM DRAPE 40X70 STRL (DRAPES) IMPLANT
SLEEVE SCD COMPRESS KNEE MED (MISCELLANEOUS) ×3 IMPLANT
SPONGE GAUZE 4X4 12PLY STER LF (GAUZE/BANDAGES/DRESSINGS) IMPLANT
SPONGE LAP 18X18 X RAY DECT (DISPOSABLE) IMPLANT
SPONGE LAP 4X18 X RAY DECT (DISPOSABLE) ×3 IMPLANT
STRIP CLOSURE SKIN 1/2X4 (GAUZE/BANDAGES/DRESSINGS) IMPLANT
SUT ETHILON 3 0 FSL (SUTURE) IMPLANT
SUT MNCRL AB 4-0 PS2 18 (SUTURE) ×3 IMPLANT
SUT SILK 2 0 SH (SUTURE) ×3 IMPLANT
SUT VIC AB 2-0 CT1 27 (SUTURE)
SUT VIC AB 2-0 CT1 TAPERPNT 27 (SUTURE) IMPLANT
SUT VIC AB 3-0 SH 27 (SUTURE)
SUT VIC AB 3-0 SH 27X BRD (SUTURE) IMPLANT
SUT VICRYL 3-0 CR8 SH (SUTURE) ×3 IMPLANT
SYRINGE 10CC LL (SYRINGE) ×3 IMPLANT
TOWEL OR 17X24 6PK STRL BLUE (TOWEL DISPOSABLE) ×3 IMPLANT
TOWEL OR NON WOVEN STRL DISP B (DISPOSABLE) IMPLANT
TUBE CONNECTING 20'X1/4 (TUBING) ×1
TUBE CONNECTING 20X1/4 (TUBING) ×2 IMPLANT
YANKAUER SUCT BULB TIP NO VENT (SUCTIONS) ×3 IMPLANT

## 2016-01-22 NOTE — Interval H&P Note (Signed)
History and Physical Interval Note:  01/22/2016 12:44 PM  Maria [name]  has presented today for surgery, with the diagnosis of Left breast mass   The various methods of treatment have been discussed with the patient and family. After consideration of risks, benefits and other options for treatment, the patient has consented to  Procedure(s): LEFT BREAST LUMPECTOMY WITH RADIOACTIVE SEED LOCALIZATION (Left) as a surgical intervention .  The patient's history has been reviewed, patient examined, no change in status, stable for surgery.  I have reviewed the patient's chart and labs.  Questions were answered to the patient's satisfaction.     [name]

## 2016-01-22 NOTE — Anesthesia Postprocedure Evaluation (Signed)
Anesthesia Post Note  Patient: Maria [name]  Procedure(s) Performed: Procedure(s) (LRB): LEFT BREAST LUMPECTOMY WITH RADIOACTIVE SEED LOCALIZATION (Left)  Patient location during evaluation: PACU Anesthesia Type: General Level of consciousness: awake and alert Pain management: pain level controlled Vital Signs Assessment: post-procedure vital signs reviewed and stable Respiratory status: spontaneous breathing, nonlabored ventilation, respiratory function stable and patient connected to nasal cannula oxygen Cardiovascular status: blood pressure returned to baseline and stable Postop Assessment: no signs of nausea or vomiting Anesthetic complications: no    Last Vitals:  Filed Vitals:   01/22/16 1500 01/22/16 1532  BP: 120/68 123/80  Pulse: 88 89  Temp:  36.8 C  Resp: 17 18    Last Pain:  Filed Vitals:   01/22/16 1533  PainSc: 2                  Margaretta Chittum DAVID

## 2016-01-22 NOTE — Op Note (Signed)
Patient Name:           Maria [name]   Date of Surgery:        01/22/2016  Pre op Diagnosis:      Abnormal mammogram left breast, upper inner quadrant  Post op Diagnosis:    Same  Procedure:                 Left breast lumpectomy with radioactive seed localization  Surgeon:                     Edsel Petrin. Dalbert Batman, M.D., FACS  Assistant:                      OR staff  Operative Indications:    This is a 45 year old Caucasian female, referred by Dr. Theda Sers at the breast center of [facility] for evaluation and surgical management of an area of distortion in the left breast, upper inner quadrant, with indeterminate biopsy and strong family history. Dr. Colin Benton is her PCP. Dr. Quincy Simmonds is her gynecologist.  She has no prior breast problems.  Recent screening imaging studies show category C density, a 2 cm area of asymmetry and distortion in the left breast, upper inner quadrant. Image guided biopsies shows fibrocystic changes, adenosis, possible hamartoma, calcifications. Radiologist recommended excision.  Family history reveals mother diagnosed with breast cancer age 33, survived. Paternal grandmother diagnosed with breast cancer in her 93s but did not recur. Maternal great-grandmother had breast cancer details unknown. No one has had genetics. There is no family history of ovarian, colon, or pancreatic cancer.  Comorbidities include BMI 38, history 3 C-sections. She was having trouble with anemia due to heavy menstrual flow but has had a hysterectomy without BSO and that has helped a great deal. She has chronic anxiety on Lexapro. Asthma on inhalers.  I explained that this was probably a low risk finding, but because of its appearance on the imaging study and her strong family history that conservative excision of this area was reasonable and she completely agrees .  Operative Findings:       On her mammographic imaging studies,  the marker clip from the original  biopsy was displaced 3 cm inferiorly from the area of interest.  The radioactive seed was placed directly in the center of interest and was 3 cm superior to the original marker clip.  and this was discussed with radiology.  I was able to hear the I-125 radioactive seed using the neoprobe in the holding area preop and during the procedure.  The lumpectomy was conservative and on the specimen mammogram we saw the radioactive seed in the center of the specimen.  The original biopsy marker clip was not seen, and presumably was not removed.  Procedure in Detail:     Following the induction of general LMA anesthesia the patient's left breast was prepped and draped in a sterile fashion, surgical timeout was performed.  0.5% Marcaine with epinephrine was used as a local infiltration anesthetic.  I used the neoprobe to identify the area of radioactivity.  This was almost 9 cm away from the areolar margin in the upper inner quadrant.  I made a very short curvilinear incision in this area.  Using the neoprobe I dissected around the radioactive seed.  The specimen was removed and marked with silk sutures and a 6 color ink kit to orient the pathologist.     Hemostasis was excellent and achieved with electrocautery.  Topically  cavity was marked with metal clips.  The deeper tissues were closed with 3-0 Vicryl sutures and the skin closed with a running subcuticular 4-0 Monocryl and Dermabond.  The patient tolerated the procedure well was taken to PACU in stable condition.  EBL 20 mL.  Counts correct.  Complications none.     Edsel Petrin. Dalbert Batman, M.D., FACS General and Minimally Invasive Surgery Breast and Colorectal Surgery  01/22/2016 2:16 PM

## 2016-01-22 NOTE — Anesthesia Procedure Notes (Signed)
Procedure Name: LMA Insertion Date/Time: 01/22/2016 1:29 PM Performed by: Melynda Ripple D Pre-anesthesia Checklist: Patient identified, Emergency Drugs available, Suction available and Patient being monitored Patient Re-evaluated:Patient Re-evaluated prior to inductionOxygen Delivery Method: Circle system utilized Preoxygenation: Pre-oxygenation with 100% oxygen Intubation Type: IV induction Ventilation: Mask ventilation without difficulty LMA: LMA inserted LMA Size: 4.0 Number of attempts: 1 Airway Equipment and Method: Bite block Placement Confirmation: positive ETCO2 Tube secured with: Tape Dental Injury: Teeth and Oropharynx as per pre-operative assessment

## 2016-01-22 NOTE — Anesthesia Preprocedure Evaluation (Signed)
Anesthesia Evaluation  Patient identified by MRN, date of birth, ID band Patient awake    Reviewed: Allergy & Precautions, NPO status , Patient's Chart, lab work & pertinent test results  Airway Mallampati: I  TM Distance: >3 FB Neck ROM: Full    Dental   Pulmonary asthma , former smoker,    Pulmonary exam normal        Cardiovascular Normal cardiovascular exam     Neuro/Psych Anxiety    GI/Hepatic   Endo/Other    Renal/GU      Musculoskeletal   Abdominal   Peds  Hematology   Anesthesia Other Findings   Reproductive/Obstetrics                             Anesthesia Physical Anesthesia Plan  ASA: II  Anesthesia Plan: General   Post-op Pain Management:    Induction: Intravenous  Airway Management Planned: LMA  Additional Equipment:   Intra-op Plan:   Post-operative Plan: Extubation in OR  Informed Consent: I have reviewed the patients History and Physical, chart, labs and discussed the procedure including the risks, benefits and alternatives for the proposed anesthesia with the patient or authorized representative who has indicated his/her understanding and acceptance.     Plan Discussed with: CRNA and Surgeon  Anesthesia Plan Comments:         Anesthesia Quick Evaluation

## 2016-01-22 NOTE — Transfer of Care (Signed)
Immediate Anesthesia Transfer of Care Note  Patient: Maria [name]  Procedure(s) Performed: Procedure(s): LEFT BREAST LUMPECTOMY WITH RADIOACTIVE SEED LOCALIZATION (Left)  Patient Location: PACU  Anesthesia Type:General  Level of Consciousness: awake and alert   Airway & Oxygen Therapy: Patient Spontanous Breathing and Patient connected to face mask oxygen  Post-op Assessment: Report given to RN and Post -op Vital signs reviewed and stable  Post vital signs: Reviewed and stable  Last Vitals:  Filed Vitals:   01/22/16 1149 01/22/16 1424  BP: 129/61   Pulse: 78 107  Temp: 36.7 C   Resp: 20 20    Last Pain: There were no vitals filed for this visit.       Complications: No apparent anesthesia complications

## 2016-01-22 NOTE — Discharge Instructions (Signed)
[facility] °Office Phone Number [phone] ° °BREAST BIOPSY/ PARTIAL MASTECTOMY: POST OP INSTRUCTIONS ° °Always review your discharge instruction sheet given to you by the facility where your surgery was performed. ° °IF YOU HAVE DISABILITY OR FAMILY LEAVE FORMS, YOU MUST BRING THEM TO THE OFFICE FOR PROCESSING.  DO NOT GIVE THEM TO YOUR DOCTOR. ° °1. A prescription for pain medication may be given to you upon discharge.  Take your pain medication as prescribed, if needed.  If narcotic pain medicine is not needed, then you may take acetaminophen (Tylenol) or ibuprofen (Advil) as needed. °2. Take your usually prescribed medications unless otherwise directed °3. If you need a refill on your pain medication, please contact your pharmacy.  They will contact our office to request authorization.  Prescriptions will not be filled after 5pm or on week-ends. °4. You should eat very light the first 24 hours after surgery, such as soup, crackers, pudding, etc.  Resume your normal diet the day after surgery. °5. Most patients will experience some swelling and bruising in the breast.  Ice packs and a good support bra will help.  Swelling and bruising can take several days to resolve.  °6. It is common to experience some constipation if taking pain medication after surgery.  Increasing fluid intake and taking a stool softener will usually help or prevent this problem from occurring.  A mild laxative (Milk of Magnesia or Miralax) should be taken according to package directions if there are no bowel movements after 48 hours. °7. Unless discharge instructions indicate otherwise, you may remove your bandages 24-48 hours after surgery, and you may shower at that time.  You may have steri-strips (small skin tapes) in place directly over the incision.  These strips should be left on the skin for 7-10 days.  If your surgeon used skin glue on the incision, you may shower in 24 hours.  The glue will flake off over the  next 2-3 weeks.  Any sutures or staples will be removed at the office during your follow-up visit. °8. ACTIVITIES:  You may resume regular daily activities (gradually increasing) beginning the next day.  Wearing a good support bra or sports bra minimizes pain and swelling.  You may have sexual intercourse when it is comfortable. °a. You may drive when you no longer are taking prescription pain medication, you can comfortably wear a seatbelt, and you can safely maneuver your car and apply brakes. °b. RETURN TO WORK:  ______________________________________________________________________________________ °9. You should see your doctor in the office for a follow-up appointment approximately two weeks after your surgery.  Your doctor’s nurse will typically make your follow-up appointment when she calls you with your pathology report.  Expect your pathology report 2-3 business days after your surgery.  You may call to check if you do not hear from us after three days. °10. OTHER INSTRUCTIONS: _______________________________________________________________________________________________ _____________________________________________________________________________________________________________________________________ °_____________________________________________________________________________________________________________________________________ °_____________________________________________________________________________________________________________________________________ ° °WHEN TO CALL YOUR DOCTOR: °1. Fever over 101.0 °2. Nausea and/or vomiting. °3. Extreme swelling or bruising. °4. Continued bleeding from incision. °5. Increased pain, redness, or drainage from the incision. ° °The clinic staff is available to answer your questions during regular business hours.  Please don’t hesitate to call and ask to speak to one of the nurses for clinical concerns.  If you have a medical emergency, go to the nearest  emergency room or call 911.  A surgeon from [facility] is always on call at the hospital. ° °For further questions, please visit [email or web address]  ° ° ° °  Post Anesthesia Home Care Instructions ° °Activity: °Get plenty of rest for the remainder of the day. A responsible adult should stay with you for 24 hours following the procedure.  °For the next 24 hours, DO NOT: °-Drive a car °-Operate machinery °-Drink alcoholic beverages °-Take any medication unless instructed by your physician °-Make any legal decisions or sign important papers. ° °Meals: °Start with liquid foods such as gelatin or soup. Progress to regular foods as tolerated. Avoid greasy, spicy, heavy foods. If nausea and/or vomiting occur, drink only clear liquids until the nausea and/or vomiting subsides. Call your physician if vomiting continues. ° °Special Instructions/Symptoms: °Your throat may feel dry or sore from the anesthesia or the breathing tube placed in your throat during surgery. If this causes discomfort, gargle with warm salt water. The discomfort should disappear within 24 hours. ° °If you had a scopolamine patch placed behind your ear for the management of post- operative nausea and/or vomiting: ° °1. The medication in the patch is effective for 72 hours, after which it should be removed.  Wrap patch in a tissue and discard in the trash. Wash hands thoroughly with soap and water. °2. You may remove the patch earlier than 72 hours if you experience unpleasant side effects which may include dry mouth, dizziness or visual disturbances. °3. Avoid touching the patch. Wash your hands with soap and water after contact with the patch. °  ° °

## 2016-01-25 ENCOUNTER — Encounter (HOSPITAL_BASED_OUTPATIENT_CLINIC_OR_DEPARTMENT_OTHER): Payer: Self-pay | Admitting: General Surgery

## 2016-01-25 ENCOUNTER — Other Ambulatory Visit: Payer: Self-pay | Admitting: Family Medicine

## 2016-01-26 ENCOUNTER — Encounter: Payer: Self-pay | Admitting: Family Medicine

## 2016-01-26 ENCOUNTER — Other Ambulatory Visit: Payer: Self-pay | Admitting: *Deleted

## 2016-01-26 MED ORDER — FLUCONAZOLE 150 MG PO TABS: 150.0000 mg | ORAL_TABLET | Freq: Once | ORAL | Status: DC

## 2016-01-26 NOTE — Telephone Encounter (Signed)
Rx done and the pt was informed via Mychart message. 

## 2016-01-26 NOTE — Progress Notes (Signed)
Quick Note:  Inform patient of Pathology report,. No cancer or atypia. Great news. Will discuss in detail in office  hmi ______

## 2016-02-10 ENCOUNTER — Encounter: Payer: Self-pay | Admitting: Genetic Counselor

## 2016-02-10 ENCOUNTER — Telehealth: Payer: Self-pay | Admitting: Genetic Counselor

## 2016-02-10 NOTE — Telephone Encounter (Signed)
Genetics appointment scheduled with Jeanine Luz on 8/3 at [facility]. Patient agreed. Demographics verified. Letter to referring.

## 2016-02-19 DIAGNOSIS — R1013 Epigastric pain: Secondary | ICD-10-CM | POA: Diagnosis not present

## 2016-02-19 DIAGNOSIS — F329 Major depressive disorder, single episode, unspecified: Secondary | ICD-10-CM | POA: Diagnosis not present

## 2016-02-19 DIAGNOSIS — Z720 Tobacco use: Secondary | ICD-10-CM | POA: Diagnosis not present

## 2016-02-19 DIAGNOSIS — K760 Fatty (change of) liver, not elsewhere classified: Secondary | ICD-10-CM | POA: Diagnosis not present

## 2016-02-19 DIAGNOSIS — K8020 Calculus of gallbladder without cholecystitis without obstruction: Secondary | ICD-10-CM | POA: Diagnosis not present

## 2016-02-19 DIAGNOSIS — R1011 Right upper quadrant pain: Secondary | ICD-10-CM | POA: Diagnosis not present

## 2016-02-19 DIAGNOSIS — E039 Hypothyroidism, unspecified: Secondary | ICD-10-CM | POA: Diagnosis not present

## 2016-02-19 DIAGNOSIS — R110 Nausea: Secondary | ICD-10-CM | POA: Diagnosis not present

## 2016-02-19 DIAGNOSIS — Z79899 Other long term (current) drug therapy: Secondary | ICD-10-CM | POA: Diagnosis not present

## 2016-02-19 DIAGNOSIS — J3089 Other allergic rhinitis: Secondary | ICD-10-CM | POA: Diagnosis not present

## 2016-02-19 DIAGNOSIS — K838 Other specified diseases of biliary tract: Secondary | ICD-10-CM | POA: Diagnosis not present

## 2016-02-22 ENCOUNTER — Encounter: Payer: Self-pay | Admitting: Family Medicine

## 2016-02-22 ENCOUNTER — Ambulatory Visit (INDEPENDENT_AMBULATORY_CARE_PROVIDER_SITE_OTHER): Payer: 59 | Admitting: Family Medicine

## 2016-02-22 VITALS — BP 102/72 | HR 82 | Temp 97.8°F | Ht 59.0 in | Wt 190.6 lb

## 2016-02-22 DIAGNOSIS — K760 Fatty (change of) liver, not elsewhere classified: Secondary | ICD-10-CM | POA: Diagnosis not present

## 2016-02-22 DIAGNOSIS — K829 Disease of gallbladder, unspecified: Secondary | ICD-10-CM | POA: Diagnosis not present

## 2016-02-22 DIAGNOSIS — E669 Obesity, unspecified: Secondary | ICD-10-CM

## 2016-02-22 DIAGNOSIS — E038 Other specified hypothyroidism: Secondary | ICD-10-CM | POA: Diagnosis not present

## 2016-02-22 DIAGNOSIS — K8020 Calculus of gallbladder without cholecystitis without obstruction: Secondary | ICD-10-CM

## 2016-02-22 LAB — TSH: TSH: 1.05 u[IU]/mL (ref 0.35–4.50)

## 2016-02-22 NOTE — Patient Instructions (Addendum)
BEFORE YOU LEAVE: -Wendie Simmer, permission to obtain US report and lab report from outside hospital -labs  -We placed a referral for you as discussed. It usually takes about 1 week to process and schedule this referral. If you have not heard from [name] or the surgeons office regarding this appointment in 1 week please contact our office.  We have ordered labs or studies at this visit. It can take up to 1-2 weeks for results and processing. IF results require follow up or explanation, we will call you with instructions. Clinically stable results will be released to your [facility]. If you have not heard from [name] or cannot find your results in [facility] in 2 weeks please contact our office at [phone].  If you are not yet signed up for [facility], please consider signing up.         We recommend the following healthy lifestyle: 1) Eat a healthy clean diet with avoidance of (less then 1 serving per week) processed foods, sweetened drinks, white starches, red meat, fast foods, fried foods and sweets and consisting of: * 5-9 servings per day of fresh or frozen fruits and vegetables (not corn or potatoes, not dried or canned) *nuts and seeds, beans *olives and olive oil *small portions of lean meats such as fish and white chicken  *small portions of whole grains -Get at least 150 minutes of sweaty aerobic exercise per week -reduce stress - counseling, meditation, relaxation to balance other aspects of your life

## 2016-02-22 NOTE — Progress Notes (Addendum)
HPI:  Maria [name] is a pleasant obese 45 yo here for an acute visit for and acute visit for "gallstones". Reports 5 attacks of RUQ severe crampy pain following meals in the last month, most recently 02/19/16 with evaluation at [name] [facility]. Reports she had labs and an [name] and was told has gallstones and follow up with surgeon for chole was advised. Reports she called her Surgeon (dr. Dalbert Batman) office and they told her she needed to see [name] first for a referral. Of note, recent hysterectomy and hx lumpectomy. Had labs recently with mildly elevated LFTs. She reports no further pain, but dull ache in RUQ and general mild malaise. No fevers, vomiting, SOB, diarrhea.  ROS: See pertinent positives and negatives per HPI.  Past Medical History  Diagnosis Date  . Thyroid disease   . Asthma   . Generalized anxiety disorder 11/21/2014  . Menorrhagia 11/21/2014    -seeing gyn    . Anemia 11/21/2014    -with elevated platelets   . Obesity 11/21/2014  . Breast cyst 11/21/2014    -L breast, upper L just off from center -stable per radiology 10/2014   . Seasonal allergies 11/21/2014  . Plantar fasciitis of left foot 11/21/2014  . Hypothyroidism   . Anxiety   . Abnormal mammogram of left breast 01/22/2016    Past Surgical History  Procedure Laterality Date  . Cesarean section      x3  . Tubal ligation    . Cysto N/A 10/06/2015    Procedure: CYSTOSCOPY;  Surgeon: Nunzio Cobbs, MD;  Location: [facility];  Service: Gynecology;  Laterality: N/A;  . Abdominal hysterectomy    . Breast lumpectomy with radioactive seed localization Left 01/22/2016    Procedure: LEFT BREAST LUMPECTOMY WITH RADIOACTIVE SEED LOCALIZATION;  Surgeon: Fanny Skates, MD;  Location: [place];  Service: General;  Laterality: Left;    Family History  Problem Relation Age of Onset  . Breast cancer Mother   . Breast cancer Paternal Grandmother   . Cervical cancer Maternal Grandmother   . Arthritis Maternal  Grandmother   . CVA Maternal Grandmother     Social History   Social History  . Marital Status: Married    Spouse Name: N/A  . Number of Children: N/A  . Years of Education: N/A   Social History Main Topics  . Smoking status: Former Smoker    Types: Cigarettes  . Smokeless tobacco: Never Used  . Alcohol Use: 0.6 oz/week    1 Glasses of wine per week     Comment: socially  . Drug Use: No  . Sexual Activity:    Partners: Male    Birth Control/ Protection: Surgical     Comment: Tubal   Other Topics Concern  . None   Social History Narrative     Current outpatient prescriptions:  .  albuterol (PROVENTIL HFA;VENTOLIN HFA) 108 (90 BASE) MCG/ACT inhaler, Inhale 1 puff into the lungs every 6 (six) hours as needed for wheezing or shortness of breath., Disp: 3 Inhaler, Rfl: 0 .  escitalopram (LEXAPRO) 20 MG tablet, TAKE 1 TABLET (20 MG TOTAL) BY MOUTH DAILY., Disp: 90 tablet, Rfl: 1 .  Fluticasone-Salmeterol (ADVAIR) 250-50 MCG/DOSE AEPB, Inhale 1 puff into the lungs daily as needed (for shortness of breath)., Disp: 60 each, Rfl: 3 .  levothyroxine (SYNTHROID, LEVOTHROID) 75 MCG tablet, Take one tablet daily 5 days a week (Patient taking differently: Take 75 mcg by mouth as directed. Take  one tablet daily 5 days a week on Monday, Tuesday, Thursday, Friday, and Saturday.), Disp: 60 tablet, Rfl: 3 .  levothyroxine (SYNTHROID, LEVOTHROID) 88 MCG tablet, Take one pill 2 days a week., Disp: 24 tablet, Rfl: 1 .  loratadine (CLARITIN) 10 MG tablet, Take 1 tablet (10 mg total) by mouth daily., Disp: 90 tablet, Rfl: 0 .  orlistat (XENICAL) 120 MG capsule, Take 1 capsule (120 mg total) by mouth 3 (three) times daily with meals., Disp: 90 capsule, Rfl: 3  EXAM:  Filed Vitals:   02/22/16 1105  BP: 102/72  Pulse: 82  Temp: 97.8 F (36.6 C)    Body mass index is 38.48 kg/(m^2).  GENERAL: vitals reviewed and listed above, alert, oriented, appears well hydrated and in no acute  distress  HEENT: atraumatic, conjunttiva clear, no obvious abnormalities on inspection of external nose and ears  NECK: no obvious masses on inspection  LUNGS: clear to auscultation bilaterally, no wheezes, rales or rhonchi, good air movement  CV: HRRR, no peripheral edema  MS: moves all extremities without noticeable abnormality  ABD: BS+, soft, NTTP, no rebound or guarding  PSYCH: pleasant and cooperative, no obvious depression or anxiety  ASSESSMENT AND PLAN:  Discussed the following assessment and plan:  Gallbladder disease - Plan: Ambulatory referral to General Surgery  Other specified hypothyroidism - Plan: TSH  Obesity  -referral per her request to gen surg -advised assistant to obtain outside labs/US report -recheck LFTs per pt request today  -lifestyle recs -Patient advised to return or notify a doctor immediately if symptoms worsen or persist or new concerns arise.  Patient Instructions  BEFORE YOU LEAVE: -Wendie Simmer, permission to obtain US report and lab report from outside hospital -labs  -We placed a referral for you as discussed. It usually takes about 1 week to process and schedule this referral. If you have not heard from [name] or the surgeons office regarding this appointment in 1 week please contact our office.  We have ordered labs or studies at this visit. It can take up to 1-2 weeks for results and processing. IF results require follow up or explanation, we will call you with instructions. Clinically stable results will be released to your [facility]. If you have not heard from [name] or cannot find your results in [facility] in 2 weeks please contact our office at [phone].  If you are not yet signed up for [facility], please consider signing up.         We recommend the following healthy lifestyle: 1) Eat a healthy clean diet with avoidance of (less then 1 serving per week) processed foods, sweetened drinks, white starches, red meat, fast foods, fried foods  and sweets and consisting of: * 5-9 servings per day of fresh or frozen fruits and vegetables (not corn or potatoes, not dried or canned) *nuts and seeds, beans *olives and olive oil *small portions of lean meats such as fish and white chicken  *small portions of whole grains -Get at least 150 minutes of sweaty aerobic exercise per week -reduce stress - counseling, meditation, relaxation to balance other aspects of your life      Colin Benton R., DO

## 2016-02-22 NOTE — Progress Notes (Signed)
Pre visit review using our clinic review tool, if applicable. No additional management support is needed unless otherwise documented below in the visit note. 

## 2016-03-10 ENCOUNTER — Other Ambulatory Visit: Payer: Self-pay | Admitting: General Surgery

## 2016-03-10 DIAGNOSIS — Z6838 Body mass index (BMI) 38.0-38.9, adult: Secondary | ICD-10-CM | POA: Diagnosis not present

## 2016-03-10 DIAGNOSIS — J45909 Unspecified asthma, uncomplicated: Secondary | ICD-10-CM | POA: Diagnosis not present

## 2016-03-10 DIAGNOSIS — Z8759 Personal history of other complications of pregnancy, childbirth and the puerperium: Secondary | ICD-10-CM | POA: Diagnosis not present

## 2016-03-10 DIAGNOSIS — Z90710 Acquired absence of both cervix and uterus: Secondary | ICD-10-CM | POA: Diagnosis not present

## 2016-03-10 DIAGNOSIS — F419 Anxiety disorder, unspecified: Secondary | ICD-10-CM | POA: Diagnosis not present

## 2016-03-10 DIAGNOSIS — Z803 Family history of malignant neoplasm of breast: Secondary | ICD-10-CM | POA: Diagnosis not present

## 2016-03-10 DIAGNOSIS — K8010 Calculus of gallbladder with chronic cholecystitis without obstruction: Secondary | ICD-10-CM | POA: Diagnosis not present

## 2016-03-12 ENCOUNTER — Emergency Department (HOSPITAL_COMMUNITY)
Admission: EM | Admit: 2016-03-12 | Discharge: 2016-03-12 | Disposition: A | Discharge status: Home / Self Care | Payer: 59 | Source: Home / Self Care | Attending: Emergency Medicine | Admitting: Emergency Medicine

## 2016-03-12 ENCOUNTER — Emergency Department (HOSPITAL_COMMUNITY): Payer: 59

## 2016-03-12 ENCOUNTER — Encounter (HOSPITAL_COMMUNITY): Payer: Self-pay | Admitting: Emergency Medicine

## 2016-03-12 DIAGNOSIS — K8020 Calculus of gallbladder without cholecystitis without obstruction: Secondary | ICD-10-CM | POA: Insufficient documentation

## 2016-03-12 DIAGNOSIS — Z79899 Other long term (current) drug therapy: Secondary | ICD-10-CM | POA: Insufficient documentation

## 2016-03-12 DIAGNOSIS — J45909 Unspecified asthma, uncomplicated: Secondary | ICD-10-CM | POA: Insufficient documentation

## 2016-03-12 DIAGNOSIS — Z87891 Personal history of nicotine dependence: Secondary | ICD-10-CM | POA: Insufficient documentation

## 2016-03-12 DIAGNOSIS — F329 Major depressive disorder, single episode, unspecified: Secondary | ICD-10-CM | POA: Diagnosis not present

## 2016-03-12 DIAGNOSIS — E669 Obesity, unspecified: Secondary | ICD-10-CM | POA: Diagnosis not present

## 2016-03-12 DIAGNOSIS — K851 Biliary acute pancreatitis without necrosis or infection: Secondary | ICD-10-CM | POA: Diagnosis not present

## 2016-03-12 DIAGNOSIS — E039 Hypothyroidism, unspecified: Secondary | ICD-10-CM | POA: Insufficient documentation

## 2016-03-12 DIAGNOSIS — N390 Urinary tract infection, site not specified: Secondary | ICD-10-CM | POA: Insufficient documentation

## 2016-03-12 DIAGNOSIS — K828 Other specified diseases of gallbladder: Secondary | ICD-10-CM | POA: Diagnosis not present

## 2016-03-12 DIAGNOSIS — R1011 Right upper quadrant pain: Secondary | ICD-10-CM

## 2016-03-12 DIAGNOSIS — Z91048 Other nonmedicinal substance allergy status: Secondary | ICD-10-CM | POA: Diagnosis not present

## 2016-03-12 DIAGNOSIS — E876 Hypokalemia: Secondary | ICD-10-CM | POA: Diagnosis not present

## 2016-03-12 LAB — URINE MICROSCOPIC-ADD ON

## 2016-03-12 LAB — COMPREHENSIVE METABOLIC PANEL
ALBUMIN: 4 g/dL (ref 3.5–5.0)
ALK PHOS: 85 U/L (ref 38–126)
ALT: 19 U/L (ref 14–54)
AST: 35 U/L (ref 15–41)
Anion gap: 14 (ref 5–15)
BILIRUBIN TOTAL: 0.5 mg/dL (ref 0.3–1.2)
BUN: 17 mg/dL (ref 6–20)
CALCIUM: 10.2 mg/dL (ref 8.9–10.3)
CO2: 22 mmol/L (ref 22–32)
Chloride: 102 mmol/L (ref 101–111)
Creatinine, Ser: 0.86 mg/dL (ref 0.44–1.00)
GFR calc Af Amer: >60 mL/min (ref >60)
GFR calc non Af Amer: >60 mL/min (ref >60)
GLUCOSE: 125 mg/dL — AB (ref 65–99)
Potassium: 3.1 mmol/L — ABNORMAL LOW (ref 3.5–5.1)
Sodium: 138 mmol/L (ref 135–145)
TOTAL PROTEIN: 7.1 g/dL (ref 6.5–8.1)

## 2016-03-12 LAB — URINALYSIS, ROUTINE W REFLEX MICROSCOPIC
Glucose, UA: NEGATIVE mg/dL
KETONES UR: NEGATIVE mg/dL
NITRITE: NEGATIVE
PH: 6.5 (ref 5.0–8.0)
Protein, ur: NEGATIVE mg/dL
Specific Gravity, Urine: 1.026 (ref 1.005–1.030)

## 2016-03-12 LAB — CBC
HCT: 38.9 % (ref 36.0–46.0)
Hemoglobin: 12.9 g/dL (ref 12.0–15.0)
MCH: 28.4 pg (ref 26.0–34.0)
MCHC: 33.2 g/dL (ref 30.0–36.0)
MCV: 85.7 fL (ref 78.0–100.0)
Platelets: 479 10*3/uL — ABNORMAL HIGH (ref 150–400)
RBC: 4.54 MIL/uL (ref 3.87–5.11)
RDW: 14.3 % (ref 11.5–15.5)
WBC: 12.2 10*3/uL — ABNORMAL HIGH (ref 4.0–10.5)

## 2016-03-12 LAB — LIPASE, BLOOD: Lipase: 35 U/L (ref 11–51)

## 2016-03-12 LAB — TROPONIN I

## 2016-03-12 MED ORDER — DICYCLOMINE HCL 10 MG/ML IM SOLN
10.0000 mg | Freq: Once | INTRAMUSCULAR | Status: AC
  Administered 2016-03-12: 10 mg via INTRAMUSCULAR
  Filled 2016-03-12: qty 2

## 2016-03-12 MED ORDER — FLUCONAZOLE 150 MG PO TABS: 150.0000 mg | ORAL_TABLET | Freq: Once | ORAL | 0 refills | Status: AC

## 2016-03-12 MED ORDER — NAPROXEN 500 MG PO TABS: 500.0000 mg | ORAL_TABLET | Freq: Two times a day (BID) | ORAL | 0 refills | Status: DC

## 2016-03-12 MED ORDER — ONDANSETRON HCL 4 MG/2ML IJ SOLN
4.0000 mg | Freq: Once | INTRAMUSCULAR | Status: AC
  Administered 2016-03-12: 4 mg via INTRAVENOUS
  Filled 2016-03-12: qty 2

## 2016-03-12 MED ORDER — FENTANYL CITRATE (PF) 100 MCG/2ML IJ SOLN
50.0000 ug | INTRAMUSCULAR | Status: AC | PRN
Start: 2016-03-12 — End: 2016-03-12
  Administered 2016-03-12 (×2): 50 ug via INTRAVENOUS
  Filled 2016-03-12 (×2): qty 2

## 2016-03-12 MED ORDER — CEPHALEXIN 500 MG PO CAPS: 500.0000 mg | ORAL_CAPSULE | Freq: Two times a day (BID) | ORAL | 0 refills | Status: DC

## 2016-03-12 MED ORDER — DICYCLOMINE HCL 20 MG PO TABS: 20.0000 mg | ORAL_TABLET | Freq: Two times a day (BID) | ORAL | 0 refills | Status: DC

## 2016-03-12 NOTE — Discharge Instructions (Signed)
Read the information below.   Your US showed gallstones; however, there was no gallbladder wall thickening or dilation of ducts. Your liver enzymes were improved. Your urine is remarkable for UTI. You had a slight WBC elevation which may be attributed to the UTI.  You are being prescribed NSAIDs for pain relief, bentyl for relief of diarrhea, and antibiotics for UTI. Stick to a bland diet. It is important that you call Dr. Dalbert Batman on Monday for re-evaluation and to notify that you were in the ED for a gallbladder attack.  Use the prescribed medication as directed.  Please discuss all new medications with your pharmacist.  You may return to the Emergency Department at any time for worsening condition or any new symptoms that concern you. Return to ED if your symptoms worsen or you develop fever, inability to keep food/fluids down, pain not controlled at home, blood in urine/stool.

## 2016-03-12 NOTE — ED Triage Notes (Signed)
Pt presents to ED with c/o abdominal pain with nausea and vomiting, no diarrhea.  Seen and evaluated last Thursday and was diagnosed with gallstones.

## 2016-03-12 NOTE — ED Provider Notes (Signed)
[facility] DEPT Provider Note   CSN: [id number] Arrival date & time: 03/12/16  [id number]  First Provider Contact:  First MD Initiated Contact with Patient 03/12/16 [phone]        History   Chief Complaint Chief Complaint  Patient presents with  . Abdominal Pain    HPI Maria [name] is a 45 y.o. female.  Maria [name] is a 45 y.o. female with history of anemia, asthma, hypothyroidism, obesity, GAD presents to ED with complaint of gallbladder attack. Patient has a known history of gallstones, last RUQ [name] on 02/19/16 showed gallstones without gallbladder thickening and 55mm diameter of CBD. She saw Dr. Dalbert Batman on Thursday and is awaiting scheduling for a cholecystectomy. Around midnight tonight she stated she started having a gallbladder attack with severe RUQ 15/10 pain with associated diaphoreses, nausea, diarrhea, chest pain, palpitations, SOB, lightheadedness, and paraesthesias in hands and feet. She states these are her typical symptoms of a gallbladder attack. She reports she has had six attacks in the last 2 months. On initial evaluation following pain medication she reports significant improvement in pain to 3/10 and resolution of chest pain, SOB, lightheadedness, paraesthesias. She denies fever, yellowing of skin, cough, URI sxs, dysuria, hematuria, arthralgias, or myalgias.       Past Medical History:  Diagnosis Date  . Abnormal mammogram of left breast 01/22/2016  . Anemia 11/21/2014   -with elevated platelets   . Anxiety   . Asthma   . Breast cyst 11/21/2014   -L breast, upper L just off from center -stable per radiology 10/2014   . Generalized anxiety disorder 11/21/2014  . Hypothyroidism   . Menorrhagia 11/21/2014   -seeing gyn    . Obesity 11/21/2014  . Plantar fasciitis of left foot 11/21/2014  . Seasonal allergies 11/21/2014  . Thyroid disease     Patient Active Problem List   Diagnosis Date Noted  . Fatty liver 02/22/2016  . Gallstones 02/22/2016  . Abnormal mammogram of  left breast 01/22/2016  . 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 Breast cyst 11/21/2014  . Menorrhagia 11/21/2014  . Anemia 11/21/2014  . Hypothyroidism 11/21/2014  . Obesity 11/21/2014  . Seasonal allergies 11/21/2014  . Asthma, chronic 11/21/2014  . Generalized anxiety disorder 11/21/2014    Past Surgical History:  Procedure Laterality Date  . ABDOMINAL HYSTERECTOMY    . BREAST LUMPECTOMY WITH RADIOACTIVE SEED LOCALIZATION Left 01/22/2016   Procedure: LEFT BREAST LUMPECTOMY WITH RADIOACTIVE SEED LOCALIZATION;  Surgeon: Fanny Skates, MD;  Location: [facility];  Service: General;  Laterality: Left;  . CESAREAN SECTION     x3  . CYSTO N/A 10/06/2015   Procedure: CYSTOSCOPY;  Surgeon: Nunzio Cobbs, MD;  Location: [facility];  Service: Gynecology;  Laterality: N/A;  . TUBAL LIGATION      OB History    Gravida Para Term Preterm AB Living   3 3 3          SAB TAB Ectopic Multiple Live Births                   Home Medications    Prior to Admission medications   Medication Sig Start Date End Date Taking? Authorizing Provider  albuterol (PROVENTIL HFA;VENTOLIN HFA) 108 (90 BASE) MCG/ACT inhaler Inhale 1 puff into the lungs every 6 (six) hours as needed for wheezing or shortness of breath. 02/23/15  Yes Lucretia Kern, DO  escitalopram (LEXAPRO) 20 MG tablet TAKE 1 TABLET (20  MG TOTAL) BY MOUTH DAILY. 01/25/16  Yes Lucretia Kern, DO  levothyroxine (SYNTHROID, LEVOTHROID) 75 MCG tablet Take one tablet daily 5 days a week Patient taking differently: Take 75 mcg by mouth as directed. Take one tablet daily 5 days a week on Monday, Tuesday, Thursday, Friday, and Saturday. 08/21/15  Yes Lucretia Kern, DO  levothyroxine (SYNTHROID, LEVOTHROID) 88 MCG tablet Take one pill 2 days a week. Patient taking differently: Take 88 mcg by mouth 2 (two) times a week. Sunday & Wednesday. 11/10/15  Yes Lucretia Kern, DO  loratadine  (CLARITIN) 10 MG tablet Take 1 tablet (10 mg total) by mouth daily. 11/21/14  Yes Lucretia Kern, DO  cephALEXin (KEFLEX) 500 MG capsule Take 1 capsule (500 mg total) by mouth 2 (two) times daily. 03/12/16 03/17/16  Roxanna Mew, PA-C  dicyclomine (BENTYL) 20 MG tablet Take 1 tablet (20 mg total) by mouth 2 (two) times daily. 03/12/16   Roxanna Mew, PA-C  fluconazole (DIFLUCAN) 150 MG tablet Take 1 tablet (150 mg total) by mouth once. 03/12/16 03/12/16  Roxanna Mew, PA-C  Fluticasone-Salmeterol (ADVAIR) 250-50 MCG/DOSE AEPB Inhale 1 puff into the lungs daily as needed (for shortness of breath). 02/23/15   Lucretia Kern, DO  naproxen (NAPROSYN) 500 MG tablet Take 1 tablet (500 mg total) by mouth 2 (two) times daily. 03/12/16   Roxanna Mew, PA-C  orlistat (XENICAL) 120 MG capsule Take 1 capsule (120 mg total) by mouth 3 (three) times daily with meals. Patient not taking: Reported on 03/12/2016 12/24/15   Lucretia Kern, DO    Family History Family History  Problem Relation Age of Onset  . Breast cancer Mother   . Breast cancer Paternal Grandmother   . Cervical cancer Maternal Grandmother   . Arthritis Maternal Grandmother   . CVA Maternal Grandmother     Social History Social History  Substance Use Topics  . Smoking status: Former Smoker    Types: Cigarettes  . Smokeless tobacco: Never Used  . Alcohol use 0.6 oz/week    1 Glasses of wine per week     Comment: socially     Allergies   Review of patient's allergies indicates no known allergies.   Review of Systems Review of Systems  Constitutional: Positive for diaphoresis ( resolved). Negative for fever.  HENT: Negative for congestion and rhinorrhea.   Eyes: Negative for visual disturbance.  Respiratory: Positive for shortness of breath ( resolved ). Negative for cough.   Cardiovascular: Positive for chest pain ( resolved).  Gastrointestinal: Positive for abdominal pain, diarrhea and nausea. Negative for blood in  stool, constipation and vomiting.  Genitourinary: Negative for dysuria and hematuria.  Musculoskeletal: Negative for arthralgias and myalgias.  Skin: Negative for color change and rash.  Allergic/Immunologic: Negative for immunocompromised state.  Neurological: Positive for light-headedness ( resolved) and headaches. Numbness:  resolved.       Paraesthesias - resolved     Physical Exam Updated Vital Signs BP 109/68   Pulse 71   Temp (S) 97.7 F (36.5 C) (Oral)   Resp 11   Ht 4\' 11"  (1.499 m)   Wt 85.3 kg   LMP 09/26/2015   SpO2 94%   BMI 37.97 kg/m   Physical Exam  Constitutional: She appears well-developed and well-nourished. No distress.  HENT:  Head: Normocephalic and atraumatic.  Eyes: Conjunctivae are normal. Pupils are equal, round, and reactive to light. Right eye exhibits no discharge. Left eye exhibits no  discharge. No scleral icterus.  Neck: Normal range of motion.  Cardiovascular: Normal rate, regular rhythm, normal heart sounds and intact distal pulses.   No murmur heard. Pulmonary/Chest: Effort normal and breath sounds normal. No respiratory distress. She has no wheezes.  Abdominal: Soft. She exhibits no distension and no mass. Bowel sounds are decreased. There is tenderness in the right upper quadrant. There is no rebound.  No peritoneal signs.  Neurological: She is alert.  Skin: Skin is warm and dry. She is not diaphoretic.  Psychiatric: She has a normal mood and affect. Her behavior is normal.     ED Treatments / Results  Labs (all labs ordered are listed, but only abnormal results are displayed) Labs Reviewed  COMPREHENSIVE METABOLIC PANEL - Abnormal; Notable for the following:       Result Value   Potassium 3.1 (*)    Glucose, Bld 125 (*)    All other components within normal limits  CBC - Abnormal; Notable for the following:    WBC 12.2 (*)    Platelets 479 (*)    All other components within normal limits  URINALYSIS, ROUTINE W REFLEX  MICROSCOPIC (NOT AT [facility]) - Abnormal; Notable for the following:    Color, Urine AMBER (*)    APPearance CLOUDY (*)    Hgb urine dipstick MODERATE (*)    Bilirubin Urine SMALL (*)    Leukocytes, UA TRACE (*)    All other components within normal limits  URINE MICROSCOPIC-ADD ON - Abnormal; Notable for the following:    Squamous Epithelial / LPF 6-30 (*)    Bacteria, UA MANY (*)    All other components within normal limits  URINE CULTURE  LIPASE, BLOOD  TROPONIN I    EKG  EKG Interpretation  Date/Time:  Saturday March 12 2016 06:45:34 EDT Ventricular Rate:  68 PR Interval:    QRS Duration: 92 QT Interval:  431 QTC Calculation: 459 R Axis:   35 Text Interpretation:  Sinus rhythm Confirmed by [facility]  MD, APRIL ([id number]) on 03/12/2016 6:48:57 AM       Radiology Dg Chest 2 View  Result Date: 03/12/2016 CLINICAL DATA:  "Gallbladder attack" EXAM: CHEST  2 VIEW COMPARISON:  October 20, 2014 FINDINGS: The heart size and mediastinal contours are within normal limits. Both lungs are clear. The visualized skeletal structures are unremarkable. IMPRESSION: No active cardiopulmonary disease. Electronically Signed   By: Dorise Bullion III M.D   On: 03/12/2016 08:39  US Abdomen Limited Ruq  Result Date: 03/12/2016 CLINICAL DATA:  Right upper quadrant pain. EXAM: US ABDOMEN LIMITED - RIGHT UPPER QUADRANT COMPARISON:  CT 12/11/2015 FINDINGS: Gallbladder: Multiple small layering gallstones, the largest 8 mm. No wall thickening or sonographic Murphy's sign. Common bile duct: Diameter: Normal caliber, 4 mm Liver: No focal lesion identified. Within normal limits in parenchymal echogenicity. IMPRESSION: Cholelithiasis.  No sonographic evidence of acute cholecystitis. Electronically Signed   By: Rolm Baptise M.D.   On: 03/12/2016 08:57   Procedures Procedures (including critical care time)  Medications Ordered in ED Medications  fentaNYL (SUBLIMAZE) injection 50 mcg (50 mcg Intravenous Given  03/12/16 0856)  ondansetron (ZOFRAN) injection 4 mg (4 mg Intravenous Given 03/12/16 0532)  dicyclomine (BENTYL) injection 10 mg (10 mg Intramuscular Given 03/12/16 1037)     Initial Impression / Assessment and Plan / ED Course  I have reviewed the triage vital signs and the nursing notes.  Pertinent labs & imaging results that were available during my care of the  patient were reviewed by me and considered in my medical decision making (see chart for details). Vitals:   03/12/16 0700 03/12/16 0730 03/12/16 0800 03/12/16 1037  BP: 107/70 107/58 102/63 109/68  Pulse: 73 69 71   Resp: 11 11 11 11   Temp:      TempSrc:      SpO2: 91% 96% 94%   Weight:      Height:        Clinical Course  Comment By Time  On re-evaluation abdominal pain has improved. Mild TTP of abdomen, but significantly improved from initial evaluation.  Roxanna Mew, [place] 07/29 1040    Patient with known history of gallstones presents to ED with complaint of "gallbladder attack." Patient is afebrile and non-toxic appearing in NAD. Vital signs are stable. Patient given IVF and pain medication with significant improvement in pain to 3/10. Abdomen is soft with TTP in RUQ. Concern for acute cholecystitis vs. Cholangitis. While CP and SOB are consistent with her GB attacks will check EKG, troponin, and CXR.  RUQ [name] to r/o acute cholecystitis. Review of [place] controlled substance database shows last Rx for oxycodone-acetaminophen 12/11/15-02/22/16. Patient denies any new Rx.   CBC remarkable for mild leukocytosis and elevated plts. LFTs and bilirubin normal - improved from previous labs.  U/A shows trace leukocytes and bacteria - ?UTI. CMP remarkable for mild decrease in potassium.  Pulmonary negative. EKG shows NSR. CXR normal. Heart score 2. Low suspicion that this is cardiac related. [name] of gallbladder shows cholelithiasis without acute cholecystitis or dilation of CBD. Patient is afebrile without evidence of acute  cholecystitis, do not think emergent surgery is warranted at this time.  On reevaluation patient endorses improvement in pain. She is less tender to palpation of her abdomen. Bentyl given in ED. Will d/c patient with pain medicine, Bentyl, ABX for UTI, and Diflucan for prophylactic yeast infection following ABX. Discussed importance of calling and following up with Dr. Dalbert Batman. Strict return precautions were discussed. Patient voiced understanding and is agreeable.  Final Clinical Impressions(s) / ED Diagnoses   Final diagnoses:  Calculus of gallbladder without cholecystitis without obstruction  UTI (lower urinary tract infection)    New Prescriptions New Prescriptions   CEPHALEXIN (KEFLEX) 500 MG CAPSULE    Take 1 capsule (500 mg total) by mouth 2 (two) times daily.   DICYCLOMINE (BENTYL) 20 MG TABLET    Take 1 tablet (20 mg total) by mouth 2 (two) times daily.   FLUCONAZOLE (DIFLUCAN) 150 MG TABLET    Take 1 tablet (150 mg total) by mouth once.   NAPROXEN (NAPROSYN) 500 MG TABLET    Take 1 tablet (500 mg total) by mouth 2 (two) times daily.     Roxanna Mew, PA-C 03/12/16 [address] Bjorn Loser, [place] 03/12/16 1158    April Palumbo, MD 03/15/16 2312

## 2016-03-13 ENCOUNTER — Encounter (HOSPITAL_COMMUNITY): Payer: Self-pay | Admitting: Emergency Medicine

## 2016-03-13 ENCOUNTER — Inpatient Hospital Stay (HOSPITAL_COMMUNITY)
Admission: EM | Admit: 2016-03-13 | Discharge: 2016-03-17 | DRG: 419 | Disposition: A | Discharge status: Home / Self Care | Payer: 59 | Attending: Internal Medicine | Admitting: Internal Medicine

## 2016-03-13 DIAGNOSIS — K8050 Calculus of bile duct without cholangitis or cholecystitis without obstruction: Secondary | ICD-10-CM

## 2016-03-13 DIAGNOSIS — K858 Other acute pancreatitis without necrosis or infection: Secondary | ICD-10-CM

## 2016-03-13 DIAGNOSIS — Z79899 Other long term (current) drug therapy: Secondary | ICD-10-CM

## 2016-03-13 DIAGNOSIS — Z87891 Personal history of nicotine dependence: Secondary | ICD-10-CM

## 2016-03-13 DIAGNOSIS — Z91048 Other nonmedicinal substance allergy status: Secondary | ICD-10-CM

## 2016-03-13 DIAGNOSIS — Z419 Encounter for procedure for purposes other than remedying health state, unspecified: Secondary | ICD-10-CM

## 2016-03-13 DIAGNOSIS — J4520 Mild intermittent asthma, uncomplicated: Secondary | ICD-10-CM

## 2016-03-13 DIAGNOSIS — F329 Major depressive disorder, single episode, unspecified: Secondary | ICD-10-CM | POA: Diagnosis present

## 2016-03-13 DIAGNOSIS — E876 Hypokalemia: Secondary | ICD-10-CM | POA: Diagnosis not present

## 2016-03-13 DIAGNOSIS — E669 Obesity, unspecified: Secondary | ICD-10-CM | POA: Diagnosis present

## 2016-03-13 DIAGNOSIS — E039 Hypothyroidism, unspecified: Secondary | ICD-10-CM | POA: Diagnosis present

## 2016-03-13 DIAGNOSIS — K8020 Calculus of gallbladder without cholecystitis without obstruction: Secondary | ICD-10-CM | POA: Diagnosis present

## 2016-03-13 DIAGNOSIS — Z6838 Body mass index (BMI) 38.0-38.9, adult: Secondary | ICD-10-CM

## 2016-03-13 DIAGNOSIS — K851 Biliary acute pancreatitis without necrosis or infection: Principal | ICD-10-CM | POA: Diagnosis present

## 2016-03-13 LAB — COMPREHENSIVE METABOLIC PANEL
ALT: 82 U/L — ABNORMAL HIGH (ref 14–54)
ANION GAP: 9 (ref 5–15)
AST: 49 U/L — ABNORMAL HIGH (ref 15–41)
Albumin: 4.4 g/dL (ref 3.5–5.0)
Alkaline Phosphatase: 88 U/L (ref 38–126)
BUN: 13 mg/dL (ref 6–20)
CALCIUM: 9.4 mg/dL (ref 8.9–10.3)
CHLORIDE: 103 mmol/L (ref 101–111)
CO2: 26 mmol/L (ref 22–32)
CREATININE: 0.72 mg/dL (ref 0.44–1.00)
Glucose, Bld: 104 mg/dL — ABNORMAL HIGH (ref 65–99)
POTASSIUM: 3.8 mmol/L (ref 3.5–5.1)
SODIUM: 138 mmol/L (ref 135–145)
TOTAL PROTEIN: 7.4 g/dL (ref 6.5–8.1)
Total Bilirubin: 0.5 mg/dL (ref 0.3–1.2)

## 2016-03-13 LAB — CBC
HCT: 38.4 % (ref 36.0–46.0)
Hemoglobin: 12.7 g/dL (ref 12.0–15.0)
MCH: 28.3 pg (ref 26.0–34.0)
MCHC: 33.1 g/dL (ref 30.0–36.0)
MCV: 85.7 fL (ref 78.0–100.0)
PLATELETS: 441 10*3/uL — AB (ref 150–400)
RBC: 4.48 MIL/uL (ref 3.87–5.11)
RDW: 13.9 % (ref 11.5–15.5)
WBC: 9.5 10*3/uL (ref 4.0–10.5)

## 2016-03-13 LAB — URINE CULTURE: Culture: NO GROWTH

## 2016-03-13 LAB — LIPASE, BLOOD: LIPASE: 955 U/L — AB (ref 11–51)

## 2016-03-13 MED ORDER — MORPHINE SULFATE (PF) 4 MG/ML IV SOLN
6.0000 mg | INTRAVENOUS | Status: DC | PRN
  Administered 2016-03-13: 6 mg via INTRAVENOUS
  Filled 2016-03-13: qty 2

## 2016-03-13 MED ORDER — ONDANSETRON HCL 4 MG/2ML IJ SOLN
4.0000 mg | Freq: Once | INTRAMUSCULAR | Status: AC
  Administered 2016-03-13: 4 mg via INTRAVENOUS
  Filled 2016-03-13: qty 2

## 2016-03-13 MED ORDER — DIPHENHYDRAMINE HCL 50 MG/ML IJ SOLN
25.0000 mg | Freq: Once | INTRAMUSCULAR | Status: AC
  Administered 2016-03-13: 25 mg via INTRAVENOUS
  Filled 2016-03-13: qty 1

## 2016-03-13 MED ORDER — SODIUM CHLORIDE 0.9 % IV SOLN
Freq: Once | INTRAVENOUS | Status: AC
  Administered 2016-03-13: 23:00:00 via INTRAVENOUS

## 2016-03-13 NOTE — ED Notes (Signed)
Pt placed on continuous pulse oximetry.

## 2016-03-13 NOTE — Consult Note (Signed)
Reason for Consult:abd pain, nausea Referring Physician: Dr.  Nani [name] is an 45 y.o. female.  HPI: 45 y.o. F who is currently in the process of getting scheduled for surgery for symptomatic cholelithiasis.  She has had several episodes of pain over the last couple months.  She was seen in the ED Sat with worsening pain.  US showed no signs of cholecystitis.  She presented last evening with worsening nausea.  Lipase elevated last night and more today as well.  She states that she is now having some burning mid-epigastric pain as well.  Past Medical History:  Diagnosis Date  . Abnormal mammogram of left breast 01/22/2016  . Anemia 11/21/2014   -with elevated platelets   . Anxiety   . Asthma   . Breast cyst 11/21/2014   -L breast, upper L just off from center -stable per radiology 10/2014   . Generalized anxiety disorder 11/21/2014  . Hypothyroidism   . Menorrhagia 11/21/2014   -seeing gyn    . Obesity 11/21/2014  . Plantar fasciitis of left foot 11/21/2014  . Seasonal allergies 11/21/2014  . Thyroid disease     Past Surgical History:  Procedure Laterality Date  . ABDOMINAL HYSTERECTOMY    . BREAST LUMPECTOMY WITH RADIOACTIVE SEED LOCALIZATION Left 01/22/2016   Procedure: LEFT BREAST LUMPECTOMY WITH RADIOACTIVE SEED LOCALIZATION;  Surgeon: Fanny Skates, MD;  Location: [facility];  Service: General;  Laterality: Left;  . CESAREAN SECTION     x3  . CYSTO N/A 10/06/2015   Procedure: CYSTOSCOPY;  Surgeon: Nunzio Cobbs, MD;  Location: [facility];  Service: Gynecology;  Laterality: N/A;  . TUBAL LIGATION      Family History  Problem Relation Age of Onset  . Breast cancer Mother   . Breast cancer Paternal Grandmother   . Cervical cancer Maternal Grandmother   . Arthritis Maternal Grandmother   . CVA Maternal Grandmother     Social History:  reports that she has quit smoking. Her smoking use included Cigarettes. She has never used smokeless tobacco. She  reports that she drinks about 0.6 oz of alcohol per week . She reports that she does not use drugs.  Allergies: No Known Allergies  Medications: I have reviewed the patient's current medications.  Results for orders placed or performed during the hospital encounter of 03/13/16 (from the past 48 hour(s))  Lipase, blood     Status: Abnormal   Collection Time: 03/13/16 10:00 PM  Result Value Ref Range   Lipase 955 (H) 11 - 51 U/L    Comment: RESULTS CONFIRMED BY MANUAL DILUTION  Comprehensive metabolic panel     Status: Abnormal   Collection Time: 03/13/16 10:00 PM  Result Value Ref Range   Sodium 138 135 - 145 mmol/L   Potassium 3.8 3.5 - 5.1 mmol/L    Comment: DELTA CHECK NOTED REPEATED TO VERIFY NO VISIBLE HEMOLYSIS    Chloride 103 101 - 111 mmol/L   CO2 26 22 - 32 mmol/L   Glucose, Bld 104 (H) 65 - 99 mg/dL   BUN 13 6 - 20 mg/dL   Creatinine, Ser 0.72 0.44 - 1.00 mg/dL   Calcium 9.4 8.9 - 10.3 mg/dL   Total Protein 7.4 6.5 - 8.1 g/dL   Albumin 4.4 3.5 - 5.0 g/dL   AST 49 (H) 15 - 41 U/L   ALT 82 (H) 14 - 54 U/L   Alkaline Phosphatase 88 38 - 126 U/L   Total Bilirubin  0.5 0.3 - 1.2 mg/dL   GFR calc non Af Amer >60 >60 mL/min   GFR calc Af Amer >60 >60 mL/min    Comment: (NOTE) The eGFR has been calculated using the CKD EPI equation. This calculation has not been validated in all clinical situations. eGFR's persistently <60 mL/min signify possible Chronic Kidney Disease.    Anion gap 9 5 - 15  CBC     Status: Abnormal   Collection Time: 03/13/16 10:00 PM  Result Value Ref Range   WBC 9.5 4.0 - 10.5 K/uL   RBC 4.48 3.87 - 5.11 MIL/uL   Hemoglobin 12.7 12.0 - 15.0 g/dL   HCT 38.4 36.0 - 46.0 %   MCV 85.7 78.0 - 100.0 fL   MCH 28.3 26.0 - 34.0 pg   MCHC 33.1 30.0 - 36.0 g/dL   RDW 13.9 11.5 - 15.5 %   Platelets 441 (H) 150 - 400 K/uL    Dg Chest 2 View  Result Date: 03/12/2016 CLINICAL DATA:  "Gallbladder attack" EXAM: CHEST  2 VIEW COMPARISON:  October 20, 2014  FINDINGS: The heart size and mediastinal contours are within normal limits. Both lungs are clear. The visualized skeletal structures are unremarkable. IMPRESSION: No active cardiopulmonary disease. Electronically Signed   By: Dorise Bullion III M.D   On: 03/12/2016 08:39  US Abdomen Limited Ruq  Result Date: 03/12/2016 CLINICAL DATA:  Right upper quadrant pain. EXAM: US ABDOMEN LIMITED - RIGHT UPPER QUADRANT COMPARISON:  CT 12/11/2015 FINDINGS: Gallbladder: Multiple small layering gallstones, the largest 8 mm. No wall thickening or sonographic Murphy's sign. Common bile duct: Diameter: Normal caliber, 4 mm Liver: No focal lesion identified. Within normal limits in parenchymal echogenicity. IMPRESSION: Cholelithiasis.  No sonographic evidence of acute cholecystitis. Electronically Signed   By: Rolm Baptise M.D.   On: 03/12/2016 08:57   Review of Systems  Constitutional: Negative for chills and fever.  HENT: Negative for tinnitus.   Eyes: Negative for blurred vision and double vision.  Respiratory: Negative for cough and shortness of breath.   Cardiovascular: Negative for chest pain and palpitations.  Gastrointestinal: Positive for abdominal pain and nausea.  Genitourinary: Negative for dysuria, frequency and urgency.  Musculoskeletal: Negative for myalgias.  Neurological: Negative for dizziness and headaches.   Last menstrual period 09/26/2015. Physical Exam  Constitutional: She is oriented to person, place, and time. She appears well-developed and well-nourished. No distress.  HENT:  Head: Normocephalic and atraumatic.  Eyes: Conjunctivae and EOM are normal. Pupils are equal, round, and reactive to light.  Neck: Normal range of motion.  Cardiovascular: Normal rate and regular rhythm.   Respiratory: Effort normal and breath sounds normal. No respiratory distress.  GI: Soft. She exhibits no distension. There is tenderness (ruq, midepigastric). There is no rebound and no guarding.   Musculoskeletal: Normal range of motion.  Neurological: She is alert and oriented to person, place, and time.  Skin: Skin is warm and dry. She is not diaphoretic.    Assessment/Plan:  45 y.o. F with worsening ruq pain and now midepigastric pain.  Consistent with gallstone pancreatitis. Cont npo and ivf's.  Will plan on cholecystectomy once pancreatitis resolves.  Currently pain and lipase are increasing.  Maria [name] C. 2/95/1884, 16:60 PM

## 2016-03-13 NOTE — ED Triage Notes (Signed)
Pt was recently diagnosed with gallstones and states she is having an attack  Pt is c/o abd pain and nausea

## 2016-03-13 NOTE — ED Provider Notes (Signed)
[facility] DEPT Provider Note   CSN: [id number] Arrival date & time: 03/13/16  2117  First Provider Contact:  First MD Initiated Contact with Patient 03/13/16 2210     By signing my name below, I, Maria [name], attest that this documentation has been prepared under the direction and in the presence of Junius Creamer, NP.  Electronically Signed: Julien [name], ED Scribe. 03/13/16. 10:17 PM.    History   Chief Complaint Chief Complaint  Patient presents with  . Cholelithiasis    The history is provided by the patient. No language interpreter was used.   HPI Comments: Maria [name] is a 45 y.o. female who presents to the Emergency Department complaining of sudden onset, gradual worsening, moderate abdominal pain onset a few hours ago. She notes associated nausea. Pt was recently diagnosed with gallstones and notes she is currently having an attack. Pt was seen in the ED yesterday for same pain and was prescribed bentyl and naproxen to alleviate her pain with no relief. She says any movement increases her pain and there are no modifying factors currently. Pt notes being seen by the surgeons last Thursday but has not had a date scheduled for the removal of her gallstones. Pt has no known drug allergies.   Past Medical History:  Diagnosis Date  . Abnormal mammogram of left breast 01/22/2016  . Anemia 11/21/2014   -with elevated platelets   . Anxiety   . Asthma   . Breast cyst 11/21/2014   -L breast, upper L just off from center -stable per radiology 10/2014   . Generalized anxiety disorder 11/21/2014  . Hypothyroidism   . Menorrhagia 11/21/2014   -seeing gyn    . Obesity 11/21/2014  . Plantar fasciitis of left foot 11/21/2014  . Seasonal allergies 11/21/2014  . Thyroid disease     Patient Active Problem List   Diagnosis Date Noted  . Fatty liver 02/22/2016  . Gallstones 02/22/2016  . Abnormal mammogram of left breast 01/22/2016  . 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 Breast cyst 11/21/2014  . Menorrhagia 11/21/2014  . Anemia 11/21/2014  . Hypothyroidism 11/21/2014  . Obesity 11/21/2014  . Seasonal allergies 11/21/2014  . Asthma, chronic 11/21/2014  . Generalized anxiety disorder 11/21/2014    Past Surgical History:  Procedure Laterality Date  . ABDOMINAL HYSTERECTOMY    . BREAST LUMPECTOMY WITH RADIOACTIVE SEED LOCALIZATION Left 01/22/2016   Procedure: LEFT BREAST LUMPECTOMY WITH RADIOACTIVE SEED LOCALIZATION;  Surgeon: Fanny Skates, MD;  Location: [facility];  Service: General;  Laterality: Left;  . CESAREAN SECTION     x3  . CYSTO N/A 10/06/2015   Procedure: CYSTOSCOPY;  Surgeon: Nunzio Cobbs, MD;  Location: [facility];  Service: Gynecology;  Laterality: N/A;  . TUBAL LIGATION      OB History    Gravida Para Term Preterm AB Living   3 3 3          SAB TAB Ectopic Multiple Live Births                   Home Medications    Prior to Admission medications   Medication Sig Start Date End Date Taking? Authorizing Provider  albuterol (PROVENTIL HFA;VENTOLIN HFA) 108 (90 BASE) MCG/ACT inhaler Inhale 1 puff into the lungs every 6 (six) hours as needed for wheezing or shortness of breath. 02/23/15  Yes Lucretia Kern, DO  cephALEXin (KEFLEX) 500 MG capsule Take 1 capsule (500 mg  total) by mouth 2 (two) times daily. 03/12/16 03/17/16 Yes Roxanna Mew, PA-C  dicyclomine (BENTYL) 20 MG tablet Take 1 tablet (20 mg total) by mouth 2 (two) times daily. 03/12/16  Yes Roxanna Mew, PA-C  escitalopram (LEXAPRO) 20 MG tablet TAKE 1 TABLET (20 MG TOTAL) BY MOUTH DAILY. 01/25/16  Yes Lucretia Kern, DO  Fluticasone-Salmeterol (ADVAIR) 250-50 MCG/DOSE AEPB Inhale 1 puff into the lungs daily as needed (for shortness of breath). 02/23/15  Yes Lucretia Kern, DO  levothyroxine (SYNTHROID, LEVOTHROID) 75 MCG tablet Take one tablet daily 5 days a week Patient taking differently: Take 75 mcg by mouth as  directed. Take one tablet daily 5 days a week on Monday, Tuesday, Thursday, Friday, and Saturday. 08/21/15  Yes Lucretia Kern, DO  levothyroxine (SYNTHROID, LEVOTHROID) 88 MCG tablet Take one pill 2 days a week. Patient taking differently: Take 88 mcg by mouth 2 (two) times a week. Sunday & Wednesday. 11/10/15  Yes Lucretia Kern, DO  loratadine (CLARITIN) 10 MG tablet Take 1 tablet (10 mg total) by mouth daily. 11/21/14  Yes Lucretia Kern, DO  naproxen (NAPROSYN) 500 MG tablet Take 1 tablet (500 mg total) by mouth 2 (two) times daily. 03/12/16  Yes Roxanna Mew, PA-C  orlistat (XENICAL) 120 MG capsule Take 1 capsule (120 mg total) by mouth 3 (three) times daily with meals. Patient not taking: Reported on 03/12/2016 12/24/15   Lucretia Kern, DO    Family History Family History  Problem Relation Age of Onset  . Breast cancer Mother   . Breast cancer Paternal Grandmother   . Cervical cancer Maternal Grandmother   . Arthritis Maternal Grandmother   . CVA Maternal Grandmother     Social History Social History  Substance Use Topics  . Smoking status: Former Smoker    Types: Cigarettes  . Smokeless tobacco: Never Used  . Alcohol use 0.6 oz/week    1 Glasses of wine per week     Comment: socially     Allergies   Review of patient's allergies indicates no known allergies.   Review of Systems Review of Systems  Gastrointestinal: Positive for abdominal pain and nausea.  All other systems reviewed and are negative.    Physical Exam Updated Vital Signs BP 111/61 (BP Location: Left Arm)   Pulse 67   Temp 97.9 F (36.6 C) (Oral)   Resp 16   LMP 09/26/2015   SpO2 99%   Physical Exam  Constitutional: She is oriented to person, place, and time. She appears well-developed and well-nourished.  Tearful and appears uncomfortable.  HENT:  Head: Normocephalic.  Eyes: EOM are normal.  Neck: Normal range of motion.  Pulmonary/Chest: Effort normal.  Abdominal: She exhibits no distension.    Musculoskeletal: Normal range of motion.  Neurological: She is alert and oriented to person, place, and time.  Psychiatric: She has a normal mood and affect.  Nursing note and vitals reviewed.    ED Treatments / Results  COORDINATION OF CARE:  10:17 PM Discussed treatment plan with pt at bedside and pt agreed to plan.  Labs (all labs ordered are listed, but only abnormal results are displayed) Labs Reviewed  LIPASE, BLOOD - Abnormal; Notable for the following:       Result Value   Lipase 955 (*)    All other components within normal limits  COMPREHENSIVE METABOLIC PANEL - Abnormal; Notable for the following:    Glucose, Bld 104 (*)  AST 49 (*)    ALT 82 (*)    All other components within normal limits  CBC - Abnormal; Notable for the following:    Platelets 441 (*)    All other components within normal limits  URINALYSIS, ROUTINE W REFLEX MICROSCOPIC (NOT AT [facility])    EKG  EKG Interpretation None       Radiology Dg Chest 2 View  Result Date: 03/12/2016 CLINICAL DATA:  "Gallbladder attack" EXAM: CHEST  2 VIEW COMPARISON:  October 20, 2014 FINDINGS: The heart size and mediastinal contours are within normal limits. Both lungs are clear. The visualized skeletal structures are unremarkable. IMPRESSION: No active cardiopulmonary disease. Electronically Signed   By: Dorise Bullion III M.D   On: 03/12/2016 08:39  US Abdomen Limited Ruq  Result Date: 03/12/2016 CLINICAL DATA:  Right upper quadrant pain. EXAM: US ABDOMEN LIMITED - RIGHT UPPER QUADRANT COMPARISON:  CT 12/11/2015 FINDINGS: Gallbladder: Multiple small layering gallstones, the largest 8 mm. No wall thickening or sonographic Murphy's sign. Common bile duct: Diameter: Normal caliber, 4 mm Liver: No focal lesion identified. Within normal limits in parenchymal echogenicity. IMPRESSION: Cholelithiasis.  No sonographic evidence of acute cholecystitis. Electronically Signed   By: Rolm Baptise M.D.   On: 03/12/2016  08:57   Procedures Procedures (including critical care time)  Medications Ordered in ED Medications  morphine 4 MG/ML injection 6 mg (6 mg Intravenous Given 03/13/16 2332)  0.9 %  sodium chloride infusion ( Intravenous New Bag/Given 03/13/16 2323)  ondansetron (ZOFRAN) injection 4 mg (4 mg Intravenous Given 03/13/16 2328)  diphenhydrAMINE (BENADRYL) injection 25 mg (25 mg Intravenous Given 03/13/16 2331)     Initial Impression / Assessment and Plan / ED Course  I have reviewed the triage vital signs and the nursing notes.  Pertinent labs & imaging results that were available during my care of the patient were reviewed by me and considered in my medical decision making (see chart for details).  Clinical Course   Elevated Lipase in 24 hours from 35 to 955 Contacted Dr. Marcello Moores, [place] for evaluation she is asking medical admission, pain control and will evaluated in AM for surgical  Intervention     Final Clinical Impressions(s) / ED Diagnoses   Final diagnoses:  Recurrent biliary colic  Other acute pancreatitis    New Prescriptions New Prescriptions   No medications on file     Junius Creamer, NP 03/14/16 0009    Carmin Muskrat, MD 03/14/16 [phone]

## 2016-03-14 ENCOUNTER — Encounter (HOSPITAL_COMMUNITY): Payer: Self-pay | Admitting: Internal Medicine

## 2016-03-14 DIAGNOSIS — F329 Major depressive disorder, single episode, unspecified: Secondary | ICD-10-CM | POA: Diagnosis not present

## 2016-03-14 DIAGNOSIS — K8010 Calculus of gallbladder with chronic cholecystitis without obstruction: Secondary | ICD-10-CM | POA: Diagnosis not present

## 2016-03-14 DIAGNOSIS — E039 Hypothyroidism, unspecified: Secondary | ICD-10-CM

## 2016-03-14 DIAGNOSIS — E876 Hypokalemia: Secondary | ICD-10-CM | POA: Diagnosis not present

## 2016-03-14 DIAGNOSIS — E669 Obesity, unspecified: Secondary | ICD-10-CM | POA: Diagnosis present

## 2016-03-14 DIAGNOSIS — Z79899 Other long term (current) drug therapy: Secondary | ICD-10-CM | POA: Diagnosis not present

## 2016-03-14 DIAGNOSIS — Z87891 Personal history of nicotine dependence: Secondary | ICD-10-CM | POA: Diagnosis not present

## 2016-03-14 DIAGNOSIS — K824 Cholesterolosis of gallbladder: Secondary | ICD-10-CM | POA: Diagnosis not present

## 2016-03-14 DIAGNOSIS — K851 Biliary acute pancreatitis without necrosis or infection: Secondary | ICD-10-CM | POA: Diagnosis not present

## 2016-03-14 DIAGNOSIS — Z6838 Body mass index (BMI) 38.0-38.9, adult: Secondary | ICD-10-CM | POA: Diagnosis not present

## 2016-03-14 DIAGNOSIS — Z91048 Other nonmedicinal substance allergy status: Secondary | ICD-10-CM | POA: Diagnosis not present

## 2016-03-14 DIAGNOSIS — K859 Acute pancreatitis without necrosis or infection, unspecified: Secondary | ICD-10-CM | POA: Diagnosis not present

## 2016-03-14 DIAGNOSIS — K8020 Calculus of gallbladder without cholecystitis without obstruction: Secondary | ICD-10-CM | POA: Diagnosis not present

## 2016-03-14 LAB — COMPREHENSIVE METABOLIC PANEL
ALT: 159 U/L — ABNORMAL HIGH (ref 14–54)
ANION GAP: 3 — AB (ref 5–15)
AST: 245 U/L — AB (ref 15–41)
Albumin: 3.7 g/dL (ref 3.5–5.0)
Alkaline Phosphatase: 92 U/L (ref 38–126)
BUN: 16 mg/dL (ref 6–20)
CHLORIDE: 106 mmol/L (ref 101–111)
CO2: 29 mmol/L (ref 22–32)
Calcium: 8.9 mg/dL (ref 8.9–10.3)
Creatinine, Ser: 0.76 mg/dL (ref 0.44–1.00)
Glucose, Bld: 107 mg/dL — ABNORMAL HIGH (ref 65–99)
POTASSIUM: 3.7 mmol/L (ref 3.5–5.1)
Sodium: 138 mmol/L (ref 135–145)
Total Bilirubin: 0.7 mg/dL (ref 0.3–1.2)
Total Protein: 6.4 g/dL — ABNORMAL LOW (ref 6.5–8.1)

## 2016-03-14 LAB — CBC WITH DIFFERENTIAL/PLATELET
Basophils Absolute: 0 10*3/uL (ref 0.0–0.1)
Basophils Relative: 0 %
Eosinophils Absolute: 0.1 10*3/uL (ref 0.0–0.7)
Eosinophils Relative: 2 %
HCT: 33.6 % — ABNORMAL LOW (ref 36.0–46.0)
HEMOGLOBIN: 11 g/dL — AB (ref 12.0–15.0)
LYMPHS ABS: 2 10*3/uL (ref 0.7–4.0)
LYMPHS PCT: 21 %
MCH: 28.4 pg (ref 26.0–34.0)
MCHC: 32.7 g/dL (ref 30.0–36.0)
MCV: 86.8 fL (ref 78.0–100.0)
Monocytes Absolute: 0.5 10*3/uL (ref 0.1–1.0)
Monocytes Relative: 5 %
NEUTROS ABS: 6.8 10*3/uL (ref 1.7–7.7)
NEUTROS PCT: 72 %
Platelets: 350 10*3/uL (ref 150–400)
RBC: 3.87 MIL/uL (ref 3.87–5.11)
RDW: 14.3 % (ref 11.5–15.5)
WBC: 9.4 10*3/uL (ref 4.0–10.5)

## 2016-03-14 LAB — APTT: aPTT: 34 seconds (ref 24–36)

## 2016-03-14 LAB — AMYLASE: AMYLASE: 1748 U/L — AB (ref 28–100)

## 2016-03-14 LAB — LIPASE, BLOOD: LIPASE: 3473 U/L — AB (ref 11–51)

## 2016-03-14 LAB — PROTIME-INR
INR: 0.99
Prothrombin Time: 13.1 seconds (ref 11.4–15.2)

## 2016-03-14 MED ORDER — LEVOTHYROXINE SODIUM 100 MCG IV SOLR
37.5000 ug | INTRAVENOUS | Status: DC
Start: 2016-03-14 — End: 2016-03-15
  Administered 2016-03-14 – 2016-03-15 (×2): 37.5 ug via INTRAVENOUS
  Filled 2016-03-14 (×2): qty 5

## 2016-03-14 MED ORDER — LEVOTHYROXINE SODIUM 100 MCG IV SOLR: 44.0000 ug | INTRAVENOUS | Status: DC

## 2016-03-14 MED ORDER — ONDANSETRON HCL 4 MG/2ML IJ SOLN
4.0000 mg | Freq: Four times a day (QID) | INTRAMUSCULAR | Status: DC | PRN
  Filled 2016-03-14: qty 2

## 2016-03-14 MED ORDER — LORAZEPAM 2 MG/ML IJ SOLN
1.0000 mg | Freq: Every evening | INTRAMUSCULAR | Status: DC | PRN
  Administered 2016-03-14: 1 mg via INTRAVENOUS
  Filled 2016-03-14: qty 1

## 2016-03-14 MED ORDER — MORPHINE SULFATE (PF) 2 MG/ML IV SOLN
2.0000 mg | INTRAVENOUS | Status: DC | PRN
  Administered 2016-03-14 – 2016-03-15 (×3): 2 mg via INTRAVENOUS
  Filled 2016-03-14 (×3): qty 1

## 2016-03-14 MED ORDER — MAGNESIUM CITRATE PO SOLN: 1.0000 | Freq: Once | ORAL | Status: DC | PRN

## 2016-03-14 MED ORDER — ENOXAPARIN SODIUM 40 MG/0.4ML ~~LOC~~ SOLN
40.0000 mg | SUBCUTANEOUS | Status: DC
  Administered 2016-03-14 – 2016-03-15 (×2): 40 mg via SUBCUTANEOUS
  Filled 2016-03-14 (×2): qty 0.4

## 2016-03-14 MED ORDER — SENNOSIDES-DOCUSATE SODIUM 8.6-50 MG PO TABS: 1.0000 | ORAL_TABLET | Freq: Every evening | ORAL | Status: DC | PRN

## 2016-03-14 MED ORDER — DIPHENHYDRAMINE HCL 50 MG/ML IJ SOLN
12.5000 mg | INTRAMUSCULAR | Status: DC | PRN
  Administered 2016-03-14: 12.5 mg via INTRAVENOUS
  Filled 2016-03-14: qty 1

## 2016-03-14 MED ORDER — ALBUTEROL SULFATE (2.5 MG/3ML) 0.083% IN NEBU: 2.5000 mg | INHALATION_SOLUTION | RESPIRATORY_TRACT | Status: DC | PRN

## 2016-03-14 MED ORDER — HEPARIN SODIUM (PORCINE) 5000 UNIT/ML IJ SOLN
5000.0000 [IU] | Freq: Three times a day (TID) | INTRAMUSCULAR | Status: DC
  Administered 2016-03-14: 5000 [IU] via SUBCUTANEOUS
  Filled 2016-03-14: qty 1

## 2016-03-14 MED ORDER — ONDANSETRON HCL 4 MG PO TABS: 4.0000 mg | ORAL_TABLET | Freq: Four times a day (QID) | ORAL | Status: DC | PRN

## 2016-03-14 MED ORDER — OXYCODONE HCL 5 MG PO TABS: 5.0000 mg | ORAL_TABLET | ORAL | Status: DC | PRN

## 2016-03-14 MED ORDER — DEXTROSE-NACL 5-0.45 % IV SOLN
INTRAVENOUS | Status: DC
  Administered 2016-03-14 – 2016-03-15 (×4): via INTRAVENOUS

## 2016-03-14 NOTE — Progress Notes (Signed)
  Subjective: Still with epigastric pain  Objective: Vital signs in last 24 hours: Temp:  [97.5 F (36.4 C)-98.4 F (36.9 C)] 98.4 F (36.9 C) (07/31 0532) Pulse Rate:  [58-77] 61 (07/31 0532) Resp:  [16-24] 18 (07/31 0532) BP: (88-112)/(46-83) 112/54 (07/31 0532) SpO2:  [96 %-100 %] 98 % (07/31 0300) Weight:  [86.7 kg (191 lb 2.2 oz)] 86.7 kg (191 lb 2.2 oz) (07/31 0300) Last BM Date: 03/12/16 ("maybe this passed Saturday")  Intake/Output from previous day: 07/30 0701 - 07/31 0700 In: 260 [I.V.:260] Out: -  Intake/Output this shift: No intake/output data recorded.  PE: General- In NAD Abdomen-soft, epigastric tenderness  Lab Results:   Recent Labs  03/13/16 2200 03/14/16 0419  WBC 9.5 9.4  HGB 12.7 11.0*  HCT 38.4 33.6*  PLT 441* 350   BMET  Recent Labs  03/13/16 2200 03/14/16 0419  NA 138 138  K 3.8 3.7  CL 103 106  CO2 26 29  GLUCOSE 104* 107*  BUN 13 16  CREATININE 0.72 0.76  CALCIUM 9.4 8.9   PT/INR  Recent Labs  03/14/16 0419  LABPROT 13.1  INR 0.99   Comprehensive Metabolic Panel:    Component Value Date/Time   NA 138 03/14/2016 0419   NA 138 03/13/2016 2200   NA 139 02/19/2015 0911   K 3.7 03/14/2016 0419   K 3.8 03/13/2016 2200   K 4.0 02/19/2015 0911   CL 106 03/14/2016 0419   CL 103 03/13/2016 2200   CO2 29 03/14/2016 0419   CO2 26 03/13/2016 2200   CO2 23 02/19/2015 0911   BUN 16 03/14/2016 0419   BUN 13 03/13/2016 2200   BUN 11.5 02/19/2015 0911   CREATININE 0.76 03/14/2016 0419   CREATININE 0.72 03/13/2016 2200   CREATININE 0.8 02/19/2015 0911   GLUCOSE 107 (H) 03/14/2016 0419   GLUCOSE 104 (H) 03/13/2016 2200   GLUCOSE 96 02/19/2015 0911   CALCIUM 8.9 03/14/2016 0419   CALCIUM 9.4 03/13/2016 2200   CALCIUM 9.6 02/19/2015 0911   AST 245 (H) 03/14/2016 0419   AST 49 (H) 03/13/2016 2200   AST 13 02/19/2015 0911   ALT 159 (H) 03/14/2016 0419   ALT 82 (H) 03/13/2016 2200   ALT 14 02/19/2015 0911   ALKPHOS 92  03/14/2016 0419   ALKPHOS 88 03/13/2016 2200   ALKPHOS 84 02/19/2015 0911   BILITOT 0.7 03/14/2016 0419   BILITOT 0.5 03/13/2016 2200   BILITOT 0.23 02/19/2015 0911   PROT 6.4 (L) 03/14/2016 0419   PROT 7.4 03/13/2016 2200   PROT 6.9 02/19/2015 0911   ALBUMIN 3.7 03/14/2016 0419   ALBUMIN 4.4 03/13/2016 2200   ALBUMIN 3.5 02/19/2015 0911     Studies/Results: No results found.  Anti-infectives: Anti-infectives    None      Assessment Acute gallstone pancreatitis-still with pain and tenderness; Lipase increasing; transaminases up; one Ranson criteria   LOS: 0 days   Plan: Hydrate more aggressively; continue bowel rest; check labs tomorrow.   Maria [name] J 03/14/2016

## 2016-03-14 NOTE — ED Notes (Signed)
Pt is aware that urine is needed for sample,

## 2016-03-14 NOTE — Progress Notes (Signed)
PROGRESS NOTE    Maria [name]  [id number] DOB: 1971/03/04 DOA: 03/13/2016 PCP: Lucretia Kern., DO   Outpatient Specialists:     Brief Narrative:  Maria [name] is a 45 y.o. female with medical history significant of Cholelithiasis who presented to the Ed with severe RUQ abdominal pains. Abdominal pain was of sudden onset,worsening gradually but consistently and unbearable, for few hours ago prior to this ED visit, associated nausea and vomiting, not being able to keep anything down. Her pains are aggravated by any bodily movements without relief prior to this ED visit, without any alleviating factor.  She had been seen last week by Dr Dalbert Batman of General surgery and work-up indicated gallstones and had been planned for surgery.     Assessment & Plan:   Principal Problem:   Acute gallstone pancreatitis Active Problems:   Pancreatitis, gallstone   Gallstone pancreatitis   Gallstone pancreatitis -NPO -aggressive IVF -trend lipase -eventual cholecystectomy -appreciate surgery  Depression -hold lexapro until able to take PO  Hypothyroid -IV Synthroid    DVT prophylaxis:  SQ Heparin   Code Status: Full Code   Family Communication: patient  Disposition Plan:  Home once GB removed   Consultants:   surgery  Procedures:         Subjective: Asking about home meds Not having pain currently Not sleeping   Objective: Vitals:   03/14/16 0200 03/14/16 0230 03/14/16 0300 03/14/16 0532  BP: (!) 101/52 103/64 105/62 (!) 112/54  Pulse: (!) 58 60 61 61  Resp:   16 18  Temp:   98.4 F (36.9 C) 98.4 F (36.9 C)  TempSrc:   Oral Oral  SpO2: 96% 97% 98%   Weight:   86.7 kg (191 lb 2.2 oz)   Height:   4\' 11"  (1.499 m)     Intake/Output Summary (Last 24 hours) at 03/14/16 1111 Last data filed at 03/14/16 1038  Gross per 24 hour  Intake              260 ml  Output                0 ml  Net              260 ml   Filed Weights   03/14/16 0300    Weight: 86.7 kg (191 lb 2.2 oz)    Examination:  General exam: Appears calm and comfortable  Respiratory system: Clear to auscultation. Respiratory effort normal. Cardiovascular system: S1 & S2 heard, RRR. No JVD, murmurs, rubs, gallops or clicks. No pedal edema. Gastrointestinal system: Abdomen is nondistended, soft.  Epigastric tenderness. No organomegaly or masses felt. Normal bowel sounds heard.     Data Reviewed: I have personally reviewed following labs and imaging studies  CBC:  Recent Labs Lab 03/12/16 0535 03/13/16 2200 03/14/16 0419  WBC 12.2* 9.5 9.4  NEUTROABS  --   --  6.8  HGB 12.9 12.7 11.0*  HCT 38.9 38.4 33.6*  MCV 85.7 85.7 86.8  PLT 479* 441* [id number]   Basic Metabolic Panel:  Recent Labs Lab 03/12/16 0535 03/13/16 2200 03/14/16 0419  NA 138 138 138  K 3.1* 3.8 3.7  CL 102 103 106  CO2 22 26 29   GLUCOSE 125* 104* 107*  BUN 17 13 16   CREATININE 0.86 0.72 0.76  CALCIUM 10.2 9.4 8.9   GFR: Estimated Creatinine Clearance: 84.1 mL/min (by C-G formula based on SCr of 0.8 mg/dL). Liver Function Tests:  Recent Labs Lab 03/12/16 0535  03/13/16 2200 03/14/16 0419  AST 35 49* 245*  ALT 19 82* 159*  ALKPHOS 85 88 92  BILITOT 0.5 0.5 0.7  PROT 7.1 7.4 6.4*  ALBUMIN 4.0 4.4 3.7    Recent Labs Lab 03/12/16 0535 03/13/16 2200 03/14/16 0419  LIPASE 35 955* 3,473*  AMYLASE  --   --  1,748*   No results for input(s): AMMONIA in the last 168 hours. Coagulation Profile:  Recent Labs Lab 03/14/16 0419  INR 0.99   Cardiac Enzymes:  Recent Labs Lab 03/12/16 0535  TROPONINI <0.03   BNP (last 3 results) No results for input(s): PROBNP in the last 8760 hours. HbA1C: No results for input(s): HGBA1C in the last 72 hours. CBG: No results for input(s): GLUCAP in the last 168 hours. Lipid Profile: No results for input(s): CHOL, HDL, LDLCALC, TRIG, CHOLHDL, LDLDIRECT in the last 72 hours. Thyroid Function Tests: No results for input(s): TSH,  T4TOTAL, FREET4, T3FREE, THYROIDAB in the last 72 hours. Anemia Panel: No results for input(s): VITAMINB12, FOLATE, FERRITIN, TIBC, IRON, RETICCTPCT in the last 72 hours. Urine analysis:    Component Value Date/Time   COLORURINE AMBER (A) 03/12/2016 0537   APPEARANCEUR CLOUDY (A) 03/12/2016 0537   LABSPEC 1.026 03/12/2016 0537   PHURINE 6.5 03/12/2016 0537   GLUCOSEU NEGATIVE 03/12/2016 0537   HGBUR MODERATE (A) 03/12/2016 0537   BILIRUBINUR SMALL (A) 03/12/2016 0537   KETONESUR NEGATIVE 03/12/2016 0537   PROTEINUR NEGATIVE 03/12/2016 0537   NITRITE NEGATIVE 03/12/2016 0537   LEUKOCYTESUR TRACE (A) 03/12/2016 0537     ) Recent Results (from the past 240 hour(s))  Urine culture     Status: None   Collection Time: 03/12/16  5:37 AM  Result Value Ref Range Status   Specimen Description URINE, RANDOM  Final   Special Requests NONE  Final   Culture NO GROWTH Performed at [facility]   Final   Report Status 03/13/2016 FINAL  Final      Anti-infectives    None       Radiology Studies: No results found.      Scheduled Meds: . heparin  5,000 Units Subcutaneous Q8H  . levothyroxine  37.5 mcg Intravenous Once per day on Mon Tue Thu Fri Sat   And  . [START ON 03/16/2016] levothyroxine  44 mcg Intravenous Once per day on Sun Wed   Continuous Infusions: . dextrose 5 % and 0.45% NaCl 200 mL/hr at 03/14/16 1020     LOS: 0 days    Time spent:25 min    Maria [name] Alison Stalling, DO [facility] Pager [phone]  If 7PM-7AM, please contact night-coverage [email or web address] Password [email or web address] 03/14/2016, 11:11 AM

## 2016-03-14 NOTE — H&P (Signed)
History and Physical    Maria [name] [id number] DOB: 16-Jun-1971 DOA: 03/13/2016  Referring MD/NP/PA: N/A PCP: Lucretia Kern., DO  Outpatient Specialists: Dr Dalbert Batman, General Surgery Patient coming from: Home  Chief Complaint: Abdominal pains  HPI: Maria [name] is a 45 y.o. female with medical history significant of Cholelithiasis who presented to the Ed with severe RUQ abdominal pains. Abdominal pain was of sudden onset,worsening gradually but consistently and unbearable, for few hours ago prior to this ED visit, associated nausea and vomiting, not being able to keep anything down. Her pains are aggravated by any bodily movements without relief prior to this ED visit, without any alleviating factor.  She had been seen last week by Dr Dalbert Batman of General surgery and work-up indicated gallstones and had been planned for surgery.   She had been seen the day before this presentation for similar but less severe pain at ED and was prescribed bentyl and naproxen with plans for out-patient surgical follow-up, but this did not help at home.      ED Course: At the ED her work-up indicated worsening of her lipase from 35 to 955 over 24 hours. She was given IV analgesics and antiemetics and General Surgery Consulted. She was seen by Dr Marcello Moores who requested Hospitalist admission for pancreatitis with plans for cholecystectomy thereafter  Review of Systems: As per HPI otherwise 10 point review of systems negative.   Past Medical History:  Diagnosis Date  . Abnormal mammogram of left breast 01/22/2016  . Anemia 11/21/2014   -with elevated platelets   . Anxiety   . Asthma   . Breast cyst 11/21/2014   -L breast, upper L just off from center -stable per radiology 10/2014   . Generalized anxiety disorder 11/21/2014  . Hypothyroidism   . Menorrhagia 11/21/2014   -seeing gyn    . Obesity 11/21/2014  . Plantar fasciitis of left foot 11/21/2014  . Seasonal allergies 11/21/2014  . Thyroid disease     Past  Surgical History:  Procedure Laterality Date  . ABDOMINAL HYSTERECTOMY    . BREAST LUMPECTOMY WITH RADIOACTIVE SEED LOCALIZATION Left 01/22/2016   Procedure: LEFT BREAST LUMPECTOMY WITH RADIOACTIVE SEED LOCALIZATION;  Surgeon: Fanny Skates, MD;  Location: [place];  Service: General;  Laterality: Left;  . CESAREAN SECTION     x3  . CYSTO N/A 10/06/2015   Procedure: CYSTOSCOPY;  Surgeon: Nunzio Cobbs, MD;  Location: [facility];  Service: Gynecology;  Laterality: N/A;  . TUBAL LIGATION       reports that she has quit smoking. Her smoking use included Cigarettes. She has never used smokeless tobacco. She reports that she drinks about 0.6 oz of alcohol per week . She reports that she does not use drugs.  No Known Allergies  Family History  Problem Relation Age of Onset  . Breast cancer Mother   . Breast cancer Paternal Grandmother   . Cervical cancer Maternal Grandmother   . Arthritis Maternal Grandmother   . CVA Maternal Grandmother     Prior to Admission medications   Medication Sig Start Date End Date Taking? Authorizing Provider  albuterol (PROVENTIL HFA;VENTOLIN HFA) 108 (90 BASE) MCG/ACT inhaler Inhale 1 puff into the lungs every 6 (six) hours as needed for wheezing or shortness of breath. 02/23/15  Yes Lucretia Kern, DO  cephALEXin (KEFLEX) 500 MG capsule Take 1 capsule (500 mg total) by mouth 2 (two) times daily. 03/12/16 03/17/16 Yes Roxanna Mew, PA-C  dicyclomine (BENTYL)  20 MG tablet Take 1 tablet (20 mg total) by mouth 2 (two) times daily. 03/12/16  Yes Roxanna Mew, PA-C  escitalopram (LEXAPRO) 20 MG tablet TAKE 1 TABLET (20 MG TOTAL) BY MOUTH DAILY. 01/25/16  Yes Lucretia Kern, DO  Fluticasone-Salmeterol (ADVAIR) 250-50 MCG/DOSE AEPB Inhale 1 puff into the lungs daily as needed (for shortness of breath). 02/23/15  Yes Lucretia Kern, DO  levothyroxine (SYNTHROID, LEVOTHROID) 75 MCG tablet Take one tablet daily 5 days a week Patient taking  differently: Take 75 mcg by mouth as directed. Take one tablet daily 5 days a week on Monday, Tuesday, Thursday, Friday, and Saturday. 08/21/15  Yes Lucretia Kern, DO  levothyroxine (SYNTHROID, LEVOTHROID) 88 MCG tablet Take one pill 2 days a week. Patient taking differently: Take 88 mcg by mouth 2 (two) times a week. Sunday & Wednesday. 11/10/15  Yes Lucretia Kern, DO  loratadine (CLARITIN) 10 MG tablet Take 1 tablet (10 mg total) by mouth daily. 11/21/14  Yes Lucretia Kern, DO  naproxen (NAPROSYN) 500 MG tablet Take 1 tablet (500 mg total) by mouth 2 (two) times daily. 03/12/16  Yes Roxanna Mew, PA-C  orlistat (XENICAL) 120 MG capsule Take 1 capsule (120 mg total) by mouth 3 (three) times daily with meals. Patient not taking: Reported on 03/12/2016 12/24/15   Lucretia Kern, DO    Physical Exam: Vitals:   03/14/16 0125 03/14/16 0130 03/14/16 0200 03/14/16 0230  BP: (!) 91/46 99/59 (!) 101/52 103/64  Pulse: 62 60 (!) 58 60  Resp: 16     Temp: 97.5 F (36.4 C)     TempSrc: Oral     SpO2: 98% 96% 96% 97%      Constitutional: NAD, calm, comfortable Vitals:   03/14/16 0125 03/14/16 0130 03/14/16 0200 03/14/16 0230  BP: (!) 91/46 99/59 (!) 101/52 103/64  Pulse: 62 60 (!) 58 60  Resp: 16     Temp: 97.5 F (36.4 C)     TempSrc: Oral     SpO2: 98% 96% 96% 97%   Eyes: PERRL, lids and conjunctivae normal ENMT: Mucous membranes are moist. Posterior pharynx clear of any exudate or lesions.Normal dentition.  Neck: normal, supple, no masses, no thyromegaly Respiratory: clear to auscultation bilaterally, no wheezing, no crackles. Normal respiratory effort. No accessory muscle use.  Cardiovascular: Regular rate and rhythm, no murmurs / rubs / gallops. No extremity edema. 2+ pedal pulses. No carotid bruits.  Abdomen: Soft with mild upper abdominal tenderness without rebound tenderness, no masses palpated. No hepatosplenomegaly. Bowel sounds present and normoactive.  Musculoskeletal: no clubbing /  cyanosis. No joint deformity upper and lower extremities. Good ROM, no contractures. Normal muscle tone.  Skin: no rashes, lesions, ulcers. No induration Neurologic: CN 2-12 grossly intact. Sensation intact, DTR normal. Strength 5/5 in all 4.  Psychiatric: Normal judgment and insight. Alert and oriented x 3. Normal mood.     Labs on Admission: I have personally reviewed following labs and imaging studies  CBC:  Recent Labs Lab 03/12/16 0535 03/13/16 2200  WBC 12.2* 9.5  HGB 12.9 12.7  HCT 38.9 38.4  MCV 85.7 85.7  PLT 479* [id number]*   Basic Metabolic Panel:  Recent Labs Lab 03/12/16 0535 03/13/16 2200  NA 138 138  K 3.1* 3.8  CL 102 103  CO2 22 26  GLUCOSE 125* 104*  BUN 17 13  CREATININE 0.86 0.72  CALCIUM 10.2 9.4   GFR: Estimated Creatinine Clearance: 83.2 mL/min (by C-G  formula based on SCr of 0.8 mg/dL). Liver Function Tests:  Recent Labs Lab 03/12/16 0535 03/13/16 2200  AST 35 49*  ALT 19 82*  ALKPHOS 85 88  BILITOT 0.5 0.5  PROT 7.1 7.4  ALBUMIN 4.0 4.4    Recent Labs Lab 03/12/16 0535 03/13/16 2200  LIPASE 35 955*   No results for input(s): AMMONIA in the last 168 hours. Coagulation Profile: No results for input(s): INR, PROTIME in the last 168 hours. Cardiac Enzymes:  Recent Labs Lab 03/12/16 0535  TROPONINI <0.03   BNP (last 3 results) No results for input(s): PROBNP in the last 8760 hours. HbA1C: No results for input(s): HGBA1C in the last 72 hours. CBG: No results for input(s): GLUCAP in the last 168 hours. Lipid Profile: No results for input(s): CHOL, HDL, LDLCALC, TRIG, CHOLHDL, LDLDIRECT in the last 72 hours. Thyroid Function Tests: No results for input(s): TSH, T4TOTAL, FREET4, T3FREE, THYROIDAB in the last 72 hours. Anemia Panel: No results for input(s): VITAMINB12, FOLATE, FERRITIN, TIBC, IRON, RETICCTPCT in the last 72 hours. Urine analysis:    Component Value Date/Time   COLORURINE AMBER (A) 03/12/2016 0537    APPEARANCEUR CLOUDY (A) 03/12/2016 0537   LABSPEC 1.026 03/12/2016 0537   PHURINE 6.5 03/12/2016 0537   GLUCOSEU NEGATIVE 03/12/2016 0537   HGBUR MODERATE (A) 03/12/2016 0537   BILIRUBINUR SMALL (A) 03/12/2016 0537   KETONESUR NEGATIVE 03/12/2016 0537   PROTEINUR NEGATIVE 03/12/2016 0537   NITRITE NEGATIVE 03/12/2016 0537   LEUKOCYTESUR TRACE (A) 03/12/2016 0537   Sepsis Labs: @LABRCNTIP (procalcitonin:4,lacticidven:4) ) Recent Results (from the past 240 hour(s))  Urine culture     Status: None   Collection Time: 03/12/16  5:37 AM  Result Value Ref Range Status   Specimen Description URINE, RANDOM  Final   Special Requests NONE  Final   Culture NO GROWTH Performed at [facility]   Final   Report Status 03/13/2016 FINAL  Final     Radiological Exams on Admission: Dg Chest 2 View  Result Date: 03/12/2016 CLINICAL DATA:  "Gallbladder attack" EXAM: CHEST  2 VIEW COMPARISON:  October 20, 2014 FINDINGS: The heart size and mediastinal contours are within normal limits. Both lungs are clear. The visualized skeletal structures are unremarkable. IMPRESSION: No active cardiopulmonary disease. Electronically Signed   By: Dorise Bullion III M.D   On: 03/12/2016 08:39  US Abdomen Limited Ruq  Result Date: 03/12/2016 CLINICAL DATA:  Right upper quadrant pain. EXAM: US ABDOMEN LIMITED - RIGHT UPPER QUADRANT COMPARISON:  CT 12/11/2015 FINDINGS: Gallbladder: Multiple small layering gallstones, the largest 8 mm. No wall thickening or sonographic Murphy's sign. Common bile duct: Diameter: Normal caliber, 4 mm Liver: No focal lesion identified. Within normal limits in parenchymal echogenicity. IMPRESSION: Cholelithiasis.  No sonographic evidence of acute cholecystitis. Electronically Signed   By: Rolm Baptise M.D.   On: 03/12/2016 08:57   EKG: Independently reviewed.  Assessment/Plan Principal Problem:   Acute gallstone pancreatitis Active Problems:   Pancreatitis, gallstone   Gallstone  pancreatitis    Cholelitghiasis Plan is to continue with regimen of bowel rest, IV hydration, Pain support with monitoring of electrolytes, LFT's and pancreatic enzymes, advancing diet as tolerated as she improves. Monitor borderline low BP on narcotic analgesia.  Anticipated operative intervention as planned when acute problem resolves.  DVT prophylaxis:  Lovenox Code Status:Full  Family Communication: N/A  Disposition Plan: Home, expected LOS 3-5 days Consults called: (Dr Jonita Albee, General Surgery)) Admission status:  (inpatient medical floor)  OSEI-BONSU,Tyan Dy MD [facility] Pager [phone]  If 7PM-7AM, please contact night-coverage [email or web address] Password [facility]  03/14/2016, 2:43 AM

## 2016-03-14 NOTE — Progress Notes (Signed)
PHARMACIST - PHYSICIAN COMMUNICATION DR:   Eliseo Squires CONCERNING: Oral to IV Levothyroxine   RECOMMENDATION: This patient was receiving Levothyroxine PO, 75 mg five days per week (Mon, Tues, Thurs, Fri, Sat) and 88 mcg twice weekly (Wed, Sun).  This has been converted to equivalent IV dosing.  If you have questions about this conversion, please contact the Pharmacy Department  []   [phone] )  [facility] PharmD, [place] Pager [phone] 03/14/2016 10:47 AM

## 2016-03-15 ENCOUNTER — Inpatient Hospital Stay (HOSPITAL_COMMUNITY): Payer: 59 | Admitting: Anesthesiology

## 2016-03-15 ENCOUNTER — Encounter (HOSPITAL_COMMUNITY)
Admission: EM | Disposition: A | Discharge status: Home / Self Care | Payer: Self-pay | Source: Home / Self Care | Attending: Internal Medicine

## 2016-03-15 ENCOUNTER — Encounter (HOSPITAL_COMMUNITY): Payer: Self-pay

## 2016-03-15 ENCOUNTER — Inpatient Hospital Stay (HOSPITAL_COMMUNITY): Payer: 59

## 2016-03-15 ENCOUNTER — Telehealth: Payer: Self-pay | Admitting: Genetic Counselor

## 2016-03-15 HISTORY — PX: CHOLECYSTECTOMY: SHX55

## 2016-03-15 LAB — COMPREHENSIVE METABOLIC PANEL
ALBUMIN: 3.5 g/dL (ref 3.5–5.0)
ALT: 114 U/L — ABNORMAL HIGH (ref 14–54)
ANION GAP: 4 — AB (ref 5–15)
AST: 67 U/L — ABNORMAL HIGH (ref 15–41)
Alkaline Phosphatase: 88 U/L (ref 38–126)
BILIRUBIN TOTAL: 0.6 mg/dL (ref 0.3–1.2)
BUN: 6 mg/dL (ref 6–20)
CO2: 27 mmol/L (ref 22–32)
Calcium: 8.3 mg/dL — ABNORMAL LOW (ref 8.9–10.3)
Chloride: 109 mmol/L (ref 101–111)
Creatinine, Ser: 0.71 mg/dL (ref 0.44–1.00)
GLUCOSE: 100 mg/dL — AB (ref 65–99)
POTASSIUM: 3.4 mmol/L — AB (ref 3.5–5.1)
Sodium: 140 mmol/L (ref 135–145)
TOTAL PROTEIN: 6.2 g/dL — AB (ref 6.5–8.1)

## 2016-03-15 LAB — CBC
HEMATOCRIT: 34.5 % — AB (ref 36.0–46.0)
Hemoglobin: 11.1 g/dL — ABNORMAL LOW (ref 12.0–15.0)
MCH: 28.2 pg (ref 26.0–34.0)
MCHC: 32.2 g/dL (ref 30.0–36.0)
MCV: 87.6 fL (ref 78.0–100.0)
Platelets: 332 10*3/uL (ref 150–400)
RBC: 3.94 MIL/uL (ref 3.87–5.11)
RDW: 14 % (ref 11.5–15.5)
WBC: 6.3 10*3/uL (ref 4.0–10.5)

## 2016-03-15 LAB — SURGICAL PCR SCREEN
MRSA, PCR: NEGATIVE
STAPHYLOCOCCUS AUREUS: NEGATIVE

## 2016-03-15 LAB — LIPASE, BLOOD: LIPASE: 52 U/L — AB (ref 11–51)

## 2016-03-15 SURGERY — LAPAROSCOPIC CHOLECYSTECTOMY WITH INTRAOPERATIVE CHOLANGIOGRAM
Anesthesia: General | Site: Abdomen

## 2016-03-15 MED ORDER — DEXTROSE 5 % IV SOLN
INTRAVENOUS | Status: AC
  Filled 2016-03-15: qty 2

## 2016-03-15 MED ORDER — DEXAMETHASONE SODIUM PHOSPHATE 10 MG/ML IJ SOLN
INTRAMUSCULAR | Status: AC
  Filled 2016-03-15: qty 1

## 2016-03-15 MED ORDER — LIDOCAINE HCL (CARDIAC) 20 MG/ML IV SOLN
INTRAVENOUS | Status: DC | PRN
  Administered 2016-03-15: 100 mg via INTRAVENOUS

## 2016-03-15 MED ORDER — KCL IN DEXTROSE-NACL 20-5-0.9 MEQ/L-%-% IV SOLN
INTRAVENOUS | Status: DC
  Administered 2016-03-15 – 2016-03-17 (×4): via INTRAVENOUS
  Filled 2016-03-15 (×5): qty 1000

## 2016-03-15 MED ORDER — ROCURONIUM BROMIDE 100 MG/10ML IV SOLN
INTRAVENOUS | Status: DC | PRN
  Administered 2016-03-15: 50 mg via INTRAVENOUS

## 2016-03-15 MED ORDER — ONDANSETRON HCL 4 MG/2ML IJ SOLN
INTRAMUSCULAR | Status: AC
  Filled 2016-03-15: qty 2

## 2016-03-15 MED ORDER — SUGAMMADEX SODIUM 200 MG/2ML IV SOLN
INTRAVENOUS | Status: DC | PRN
  Administered 2016-03-15: 200 mg via INTRAVENOUS

## 2016-03-15 MED ORDER — DIPHENHYDRAMINE HCL 50 MG/ML IJ SOLN
12.5000 mg | Freq: Three times a day (TID) | INTRAMUSCULAR | Status: DC | PRN
  Administered 2016-03-15 – 2016-03-17 (×3): 12.5 mg via INTRAVENOUS
  Filled 2016-03-15 (×3): qty 1

## 2016-03-15 MED ORDER — MORPHINE SULFATE (PF) 2 MG/ML IV SOLN
2.0000 mg | INTRAVENOUS | Status: DC | PRN
  Administered 2016-03-15 (×2): 4 mg via INTRAVENOUS
  Administered 2016-03-16 (×5): 2 mg via INTRAVENOUS
  Filled 2016-03-15 (×2): qty 2
  Filled 2016-03-15 (×5): qty 1

## 2016-03-15 MED ORDER — LACTATED RINGERS IV SOLN
INTRAVENOUS | Status: DC | PRN
  Administered 2016-03-15 (×2): via INTRAVENOUS

## 2016-03-15 MED ORDER — ONDANSETRON 4 MG PO TBDP: 4.0000 mg | ORAL_TABLET | Freq: Four times a day (QID) | ORAL | Status: DC | PRN

## 2016-03-15 MED ORDER — EPHEDRINE SULFATE 50 MG/ML IJ SOLN
INTRAMUSCULAR | Status: DC | PRN
  Administered 2016-03-15: 5 mg via INTRAVENOUS

## 2016-03-15 MED ORDER — PROPOFOL 10 MG/ML IV BOLUS
INTRAVENOUS | Status: AC
  Filled 2016-03-15: qty 20

## 2016-03-15 MED ORDER — DEXTROSE 5 % IV SOLN
2.0000 g | INTRAVENOUS | Status: AC
  Administered 2016-03-15: 2 g via INTRAVENOUS

## 2016-03-15 MED ORDER — OXYCODONE HCL 5 MG PO TABS
5.0000 mg | ORAL_TABLET | ORAL | Status: DC | PRN
  Administered 2016-03-16: 10 mg via ORAL
  Administered 2016-03-16: 5 mg via ORAL
  Administered 2016-03-17 (×2): 10 mg via ORAL
  Filled 2016-03-15: qty 2
  Filled 2016-03-15: qty 1
  Filled 2016-03-15 (×2): qty 2

## 2016-03-15 MED ORDER — LEVOTHYROXINE SODIUM 25 MCG PO TABS
75.0000 ug | ORAL_TABLET | ORAL | Status: DC
  Administered 2016-03-17: 75 ug via ORAL
  Filled 2016-03-15: qty 1

## 2016-03-15 MED ORDER — HEPARIN SODIUM (PORCINE) 5000 UNIT/ML IJ SOLN
5000.0000 [IU] | Freq: Three times a day (TID) | INTRAMUSCULAR | Status: DC
  Administered 2016-03-16 – 2016-03-17 (×4): 5000 [IU] via SUBCUTANEOUS
  Filled 2016-03-15 (×4): qty 1

## 2016-03-15 MED ORDER — ONDANSETRON HCL 4 MG/2ML IJ SOLN
INTRAMUSCULAR | Status: DC | PRN
  Administered 2016-03-15: 4 mg via INTRAVENOUS

## 2016-03-15 MED ORDER — ROCURONIUM BROMIDE 100 MG/10ML IV SOLN
INTRAVENOUS | Status: AC
  Filled 2016-03-15: qty 1

## 2016-03-15 MED ORDER — PROPOFOL 10 MG/ML IV BOLUS
INTRAVENOUS | Status: DC | PRN
  Administered 2016-03-15: 150 mg via INTRAVENOUS

## 2016-03-15 MED ORDER — MEPERIDINE HCL 50 MG/ML IJ SOLN: 6.2500 mg | INTRAMUSCULAR | Status: DC | PRN

## 2016-03-15 MED ORDER — SUGAMMADEX SODIUM 200 MG/2ML IV SOLN
INTRAVENOUS | Status: AC
Start: 2016-03-15 — End: 2016-03-15
  Filled 2016-03-15: qty 2

## 2016-03-15 MED ORDER — ALBUTEROL SULFATE HFA 108 (90 BASE) MCG/ACT IN AERS: 1.0000 | INHALATION_SPRAY | Freq: Four times a day (QID) | RESPIRATORY_TRACT | Status: DC | PRN

## 2016-03-15 MED ORDER — IOPAMIDOL (ISOVUE-300) INJECTION 61%
INTRAVENOUS | Status: DC | PRN
  Administered 2016-03-15: 3 mL

## 2016-03-15 MED ORDER — IOPAMIDOL (ISOVUE-300) INJECTION 61%
INTRAVENOUS | Status: AC
  Filled 2016-03-15: qty 50

## 2016-03-15 MED ORDER — HYDROMORPHONE HCL 1 MG/ML IJ SOLN
0.2500 mg | INTRAMUSCULAR | Status: DC | PRN
  Administered 2016-03-15 (×4): 0.5 mg via INTRAVENOUS

## 2016-03-15 MED ORDER — DEXAMETHASONE SODIUM PHOSPHATE 10 MG/ML IJ SOLN
INTRAMUSCULAR | Status: DC | PRN
  Administered 2016-03-15: 10 mg via INTRAVENOUS

## 2016-03-15 MED ORDER — MIDAZOLAM HCL 5 MG/5ML IJ SOLN
INTRAMUSCULAR | Status: DC | PRN
  Administered 2016-03-15: 2 mg via INTRAVENOUS

## 2016-03-15 MED ORDER — ONDANSETRON HCL 4 MG/2ML IJ SOLN: 4.0000 mg | INTRAMUSCULAR | Status: DC | PRN

## 2016-03-15 MED ORDER — LIP MEDEX EX OINT
TOPICAL_OINTMENT | CUTANEOUS | Status: AC
  Administered 2016-03-15: 19:00:00
  Filled 2016-03-15: qty 7

## 2016-03-15 MED ORDER — ONDANSETRON HCL 4 MG/2ML IJ SOLN: 4.0000 mg | Freq: Once | INTRAMUSCULAR | Status: DC | PRN

## 2016-03-15 MED ORDER — 0.9 % SODIUM CHLORIDE (POUR BTL) OPTIME
TOPICAL | Status: DC | PRN
  Administered 2016-03-15: 1000 mL

## 2016-03-15 MED ORDER — FENTANYL CITRATE (PF) 250 MCG/5ML IJ SOLN
INTRAMUSCULAR | Status: AC
  Filled 2016-03-15: qty 5

## 2016-03-15 MED ORDER — LEVOTHYROXINE SODIUM 88 MCG PO TABS
88.0000 ug | ORAL_TABLET | ORAL | Status: DC
  Administered 2016-03-16: 88 ug via ORAL
  Filled 2016-03-15: qty 1

## 2016-03-15 MED ORDER — FENTANYL CITRATE (PF) 100 MCG/2ML IJ SOLN
INTRAMUSCULAR | Status: DC | PRN
  Administered 2016-03-15: 50 ug via INTRAVENOUS
  Administered 2016-03-15: 100 ug via INTRAVENOUS
  Administered 2016-03-15: 50 ug via INTRAVENOUS

## 2016-03-15 MED ORDER — BUPIVACAINE HCL (PF) 0.5 % IJ SOLN
INTRAMUSCULAR | Status: AC
  Filled 2016-03-15: qty 30

## 2016-03-15 MED ORDER — MIDAZOLAM HCL 2 MG/2ML IJ SOLN
INTRAMUSCULAR | Status: AC
  Filled 2016-03-15: qty 2

## 2016-03-15 MED ORDER — HYDROMORPHONE HCL 1 MG/ML IJ SOLN
INTRAMUSCULAR | Status: AC
  Filled 2016-03-15: qty 1

## 2016-03-15 MED ORDER — LACTATED RINGERS IR SOLN
Status: DC | PRN
  Administered 2016-03-15: 1000 mL

## 2016-03-15 SURGICAL SUPPLY — 41 items
APPLIER CLIP 5 13 M/L LIGAMAX5 (MISCELLANEOUS) ×3
BENZOIN TINCTURE PRP APPL 2/3 (GAUZE/BANDAGES/DRESSINGS) ×3 IMPLANT
CABLE HIGH FREQUENCY MONO STRZ (ELECTRODE) ×6 IMPLANT
CATH REDDICK CHOLANGI 4FR 50CM (CATHETERS) ×3 IMPLANT
CHLORAPREP W/TINT 26ML (MISCELLANEOUS) ×3 IMPLANT
CLIP APPLIE 5 13 M/L LIGAMAX5 (MISCELLANEOUS) ×1 IMPLANT
CLOSURE WOUND 1/2 X4 (GAUZE/BANDAGES/DRESSINGS) ×1
COVER MAYO STAND STRL (DRAPES) ×3 IMPLANT
COVER SURGICAL LIGHT HANDLE (MISCELLANEOUS) ×3 IMPLANT
DECANTER SPIKE VIAL GLASS SM (MISCELLANEOUS) ×3 IMPLANT
DISSECTOR BLUNT TIP ENDO 5MM (MISCELLANEOUS) IMPLANT
DRAPE C-ARM 42X120 X-RAY (DRAPES) ×3 IMPLANT
DRAPE LAPAROSCOPIC ABDOMINAL (DRAPES) ×3 IMPLANT
DRSG TEGADERM 2-3/8X2-3/4 SM (GAUZE/BANDAGES/DRESSINGS) ×12 IMPLANT
ELECT REM PT RETURN 9FT ADLT (ELECTROSURGICAL) ×3
ELECTRODE REM PT RTRN 9FT ADLT (ELECTROSURGICAL) ×1 IMPLANT
ENDOLOOP SUT PDS II  0 18 (SUTURE)
ENDOLOOP SUT PDS II 0 18 (SUTURE) IMPLANT
GAUZE SPONGE 2X2 8PLY STRL LF (GAUZE/BANDAGES/DRESSINGS) ×1 IMPLANT
GLOVE ECLIPSE 8.0 STRL XLNG CF (GLOVE) ×3 IMPLANT
GLOVE INDICATOR 8.0 STRL GRN (GLOVE) ×3 IMPLANT
GOWN STRL REUS W/TWL XL LVL3 (GOWN DISPOSABLE) ×9 IMPLANT
HEMOSTAT SNOW SURGICEL 2X4 (HEMOSTASIS) IMPLANT
IRRIG SUCT STRYKERFLOW 2 WTIP (MISCELLANEOUS) ×3
IRRIGATION SUCT STRKRFLW 2 WTP (MISCELLANEOUS) ×1 IMPLANT
IV CATH 14GX2 1/4 (CATHETERS) ×3 IMPLANT
KIT BASIN OR (CUSTOM PROCEDURE TRAY) ×3 IMPLANT
POUCH SPECIMEN RETRIEVAL 10MM (ENDOMECHANICALS) ×3 IMPLANT
SCISSORS LAP 5X35 DISP (ENDOMECHANICALS) ×3 IMPLANT
SLEEVE XCEL OPT CAN 5 100 (ENDOMECHANICALS) ×6 IMPLANT
SPONGE GAUZE 2X2 STER 10/PKG (GAUZE/BANDAGES/DRESSINGS) ×2
STRIP CLOSURE SKIN 1/2X4 (GAUZE/BANDAGES/DRESSINGS) ×2 IMPLANT
SUT MNCRL AB 4-0 PS2 18 (SUTURE) ×6 IMPLANT
TAPE CLOTH SURG 4X10 WHT LF (GAUZE/BANDAGES/DRESSINGS) ×3 IMPLANT
TOWEL OR 17X26 10 PK STRL BLUE (TOWEL DISPOSABLE) ×3 IMPLANT
TOWEL OR NON WOVEN STRL DISP B (DISPOSABLE) ×3 IMPLANT
TRAY LAPAROSCOPIC (CUSTOM PROCEDURE TRAY) ×3 IMPLANT
TROCAR BLADELESS OPT 5 100 (ENDOMECHANICALS) ×3 IMPLANT
TROCAR XCEL BLUNT TIP 100MML (ENDOMECHANICALS) ×3 IMPLANT
TROCAR XCEL NON-BLD 11X100MML (ENDOMECHANICALS) IMPLANT
TUBING INSUF HEATED (TUBING) ×3 IMPLANT

## 2016-03-15 NOTE — Anesthesia Postprocedure Evaluation (Signed)
Anesthesia Post Note  Patient: Maria [name]  Procedure(s) Performed: Procedure(s) (LRB): LAPAROSCOPIC CHOLECYSTECTOMY WITH INTRAOPERATIVE CHOLANGIOGRAM (N/A)  Patient location during evaluation: PACU Anesthesia Type: General Level of consciousness: awake and alert Pain management: pain level controlled Vital Signs Assessment: post-procedure vital signs reviewed and stable Respiratory status: spontaneous breathing, nonlabored ventilation, respiratory function stable and patient connected to nasal cannula oxygen Cardiovascular status: blood pressure returned to baseline and stable Postop Assessment: no signs of nausea or vomiting Anesthetic complications: no    Last Vitals:  Vitals:   03/15/16 1730 03/15/16 1740  BP: 100/84 119/75  Pulse: 94   Resp: 18 18  Temp:  37.4 C    Last Pain:  Vitals:   03/15/16 1700  TempSrc:   PainSc: 10-Worst pain ever                 Rakesha Dalporto DAVID

## 2016-03-15 NOTE — Transfer of Care (Signed)
Immediate Anesthesia Transfer of Care Note  Patient: Maria [name]  Procedure(s) Performed: Procedure(s): LAPAROSCOPIC CHOLECYSTECTOMY WITH INTRAOPERATIVE CHOLANGIOGRAM (N/A)  Patient Location: PACU  Anesthesia Type:General  Level of Consciousness: awake, alert  and oriented  Airway & Oxygen Therapy: Patient Spontanous Breathing and Patient connected to face mask oxygen  Post-op Assessment: Report given to RN and Post -op Vital signs reviewed and stable  Post vital signs: Reviewed and stable  Last Vitals:  Vitals:   03/15/16 0550 03/15/16 1410  BP: 128/77 115/78  Pulse: 61 (!) 59  Resp: 18 16  Temp: 36.7 C 36.8 C    Last Pain:  Vitals:   03/15/16 1452  TempSrc:   PainSc: 0-No pain      Patients Stated Pain Goal: 2 ([id number])  Complications: No apparent anesthesia complications

## 2016-03-15 NOTE — Consult Note (Signed)
   [facility] CM Inpatient Consult   03/15/2016  Janissa Kepford [birth year] [id number]    Came to visit Mrs. Buboltz at bedside on behalf of Link to [employer] program for [employer] employees/dependents with [employer]. Denies having any Link to Wellness needs at this time after program explained. However, agreeable to post discharge follow up call. Confirmed best contact number as [phone]. Left Link to Wellness packet and contact information at bedside. Appreciative of visit.  Marthenia Rolling, MSN-Ed, RN,BSN [facility] Liaison [phone]

## 2016-03-15 NOTE — Progress Notes (Signed)
PROGRESS NOTE    Maria [name]  [id number] DOB: 1971-04-04 DOA: 03/13/2016 PCP: Lucretia Kern., DO   Outpatient Specialists:     Brief Narrative:  Maria [name] is a 45 y.o. female with medical history significant of Cholelithiasis who presented to the Ed with severe RUQ abdominal pains. Abdominal pain was of sudden onset,worsening gradually but consistently and unbearable, for few hours ago prior to this ED visit, associated nausea and vomiting, not being able to keep anything down. Her pains are aggravated by any bodily movements without relief prior to this ED visit, without any alleviating factor.  She had been seen last week by Dr Dalbert Batman of General surgery and work-up indicated gallstones and had been planned for surgery.     Assessment & Plan:   Principal Problem:   Acute gallstone pancreatitis Active Problems:   Pancreatitis, gallstone   Gallstone pancreatitis   Gallstone pancreatitis -NPO -aggressive IVF -lipase improved -eventual cholecystectomy- earliest would be 8/2 -appreciate surgery  Depression -hold lexapro until able to take PO  Hypothyroid -IV Synthroid    DVT prophylaxis:  SQ Heparin   Code Status: Full Code   Family Communication: Patient/husband  Disposition Plan:  Home once GB removed   Consultants:   surgery  Procedures:         Subjective: Urinating a lot    Objective: Vitals:   03/14/16 0532 03/14/16 1313 03/14/16 2112 03/15/16 0550  BP: (!) 112/54 112/75 125/64 128/77  Pulse: 61 (!) 50 (!) 55 61  Resp: 18 16 16 18   Temp: 98.4 F (36.9 C) 98 F (36.7 C) 98.3 F (36.8 C) 98 F (36.7 C)  TempSrc: Oral Oral Oral Oral  SpO2:  98% 98% 99%  Weight:      Height:        Intake/Output Summary (Last 24 hours) at 03/15/16 1140 Last data filed at 03/15/16 0547  Gross per 24 hour  Intake             3400 ml  Output                0 ml  Net             3400 ml   Filed Weights   03/14/16 0300  Weight:  86.7 kg (191 lb 2.2 oz)    Examination:  General exam: Appears calm and comfortable  Respiratory system: Clear to auscultation. Respiratory effort normal. Cardiovascular system: S1 & S2 heard, RRR. No JVD, murmurs, rubs, gallops or clicks. No pedal edema. Gastrointestinal system: Abdomen is nondistended, soft.  Epigastric tenderness. No organomegaly or masses felt. Normal bowel sounds heard.     Data Reviewed: I have personally reviewed following labs and imaging studies  CBC:  Recent Labs Lab 03/12/16 0535 03/13/16 2200 03/14/16 0419 03/15/16 0331  WBC 12.2* 9.5 9.4 6.3  NEUTROABS  --   --  6.8  --   HGB 12.9 12.7 11.0* 11.1*  HCT 38.9 38.4 33.6* 34.5*  MCV 85.7 85.7 86.8 87.6  PLT 479* 441* 350 [id number]   Basic Metabolic Panel:  Recent Labs Lab 03/12/16 0535 03/13/16 2200 03/14/16 0419 03/15/16 0331  NA 138 138 138 140  K 3.1* 3.8 3.7 3.4*  CL 102 103 106 109  CO2 22 26 29 27   GLUCOSE 125* 104* 107* 100*  BUN 17 13 16 6   CREATININE 0.86 0.72 0.76 0.71  CALCIUM 10.2 9.4 8.9 8.3*   GFR: Estimated Creatinine Clearance: 84.1 mL/min (by C-G formula based on  SCr of 0.8 mg/dL). Liver Function Tests:  Recent Labs Lab 03/12/16 0535 03/13/16 2200 03/14/16 0419 03/15/16 0331  AST 35 49* 245* 67*  ALT 19 82* 159* 114*  ALKPHOS 85 88 92 88  BILITOT 0.5 0.5 0.7 0.6  PROT 7.1 7.4 6.4* 6.2*  ALBUMIN 4.0 4.4 3.7 3.5    Recent Labs Lab 03/12/16 0535 03/13/16 2200 03/14/16 0419 03/15/16 0331  LIPASE 35 955* 3,473* 52*  AMYLASE  --   --  1,748*  --    No results for input(s): AMMONIA in the last 168 hours. Coagulation Profile:  Recent Labs Lab 03/14/16 0419  INR 0.99   Cardiac Enzymes:  Recent Labs Lab 03/12/16 0535  TROPONINI <0.03   BNP (last 3 results) No results for input(s): PROBNP in the last 8760 hours. HbA1C: No results for input(s): HGBA1C in the last 72 hours. CBG: No results for input(s): GLUCAP in the last 168 hours. Lipid  Profile: No results for input(s): CHOL, HDL, LDLCALC, TRIG, CHOLHDL, LDLDIRECT in the last 72 hours. Thyroid Function Tests: No results for input(s): TSH, T4TOTAL, FREET4, T3FREE, THYROIDAB in the last 72 hours. Anemia Panel: No results for input(s): VITAMINB12, FOLATE, FERRITIN, TIBC, IRON, RETICCTPCT in the last 72 hours. Urine analysis:    Component Value Date/Time   COLORURINE AMBER (A) 03/12/2016 0537   APPEARANCEUR CLOUDY (A) 03/12/2016 0537   LABSPEC 1.026 03/12/2016 0537   PHURINE 6.5 03/12/2016 0537   GLUCOSEU NEGATIVE 03/12/2016 0537   HGBUR MODERATE (A) 03/12/2016 0537   BILIRUBINUR SMALL (A) 03/12/2016 0537   KETONESUR NEGATIVE 03/12/2016 0537   PROTEINUR NEGATIVE 03/12/2016 0537   NITRITE NEGATIVE 03/12/2016 0537   LEUKOCYTESUR TRACE (A) 03/12/2016 0537     ) Recent Results (from the past 240 hour(s))  Urine culture     Status: None   Collection Time: 03/12/16  5:37 AM  Result Value Ref Range Status   Specimen Description URINE, RANDOM  Final   Special Requests NONE  Final   Culture NO GROWTH Performed at [facility]   Final   Report Status 03/13/2016 FINAL  Final  Surgical pcr screen     Status: None   Collection Time: 03/15/16  6:20 AM  Result Value Ref Range Status   MRSA, PCR NEGATIVE NEGATIVE Final   Staphylococcus aureus NEGATIVE NEGATIVE Final    Comment:        The Xpert SA Assay (FDA approved for NASAL specimens in patients over 85 years of age), is one component of a comprehensive surveillance program.  Test performance has been validated by [facility] for patients greater than or equal to 57 year old. It is not intended to diagnose infection nor to guide or monitor treatment.       Anti-infectives    None       Radiology Studies: No results found.      Scheduled Meds: . enoxaparin (LOVENOX) injection  40 mg Subcutaneous Q24H  . levothyroxine  37.5 mcg Intravenous Once per day on Mon Tue Thu Fri Sat   And  .  [START ON 03/16/2016] levothyroxine  44 mcg Intravenous Once per day on Sun Wed   Continuous Infusions: . dextrose 5 % and 0.45% NaCl 200 mL/hr at 03/15/16 1046     LOS: 1 day    Time spent:25 min    Tiler Brandis Alison Stalling, DO [facility] Pager [phone]  If 7PM-7AM, please contact night-coverage [email or web address] Password [email or web address] 03/15/2016, 11:40 AM

## 2016-03-15 NOTE — Telephone Encounter (Deleted)
Spoke with pt regarding appt for today and pt is now at [facility] and informed me that all future appt will need to be made with them for the next month.  I contacted Rehab and rescheduled pt for later this week.

## 2016-03-15 NOTE — Anesthesia Procedure Notes (Addendum)
Procedure Name: Intubation Date/Time: 03/15/2016 3:09 PM Performed by: Glory Buff Pre-anesthesia Checklist: Patient identified, Emergency Drugs available, Suction available and Patient being monitored Patient Re-evaluated:Patient Re-evaluated prior to inductionOxygen Delivery Method: Circle system utilized Preoxygenation: Pre-oxygenation with 100% oxygen Intubation Type: IV induction Ventilation: Mask ventilation without difficulty Laryngoscope Size: Mac and 3 Grade View: Grade I Tube type: Oral Tube size: 7.5 mm Number of attempts: 1 Airway Equipment and Method: Stylet Placement Confirmation: ETT inserted through vocal cords under direct vision,  positive ETCO2 and breath sounds checked- equal and bilateral Secured at: 22 cm Tube secured with: Tape Dental Injury: Teeth and Oropharynx as per pre-operative assessment  Comments: Atraumatic intubation by Lars Mage.

## 2016-03-15 NOTE — Progress Notes (Signed)
Subjective: She is feeling much better. Pain is gone. She is hungry.  Objective: Vital signs in last 24 hours: Temp:  [98 F (36.7 C)-98.3 F (36.8 C)] 98 F (36.7 C) (08/01 0550) Pulse Rate:  [50-61] 61 (08/01 0550) Resp:  [16-18] 18 (08/01 0550) BP: (112-128)/(64-77) 128/77 (08/01 0550) SpO2:  [98 %-99 %] 99 % (08/01 0550) Last BM Date: 03/12/16 Nothing by mouth Urine output not calculated Afebrile vital signs are stable Lipase is down to 52, potassium 3.4 AST and ALT are improving. Normal WBC  Intake/Output from previous day: 07/31 0701 - 08/01 0700 In: 3900 [I.V.:3900] Out: -  Intake/Output this shift: No intake/output data recorded.  General appearance: alert, cooperative and no distress GI: Soft, almost no discomfort at all. Marked improvement  Lab Results:   Recent Labs  03/14/16 0419 03/15/16 0331  WBC 9.4 6.3  HGB 11.0* 11.1*  HCT 33.6* 34.5*  PLT 350 332    BMET  Recent Labs  03/14/16 0419 03/15/16 0331  NA 138 140  K 3.7 3.4*  CL 106 109  CO2 29 27  GLUCOSE 107* 100*  BUN 16 6  CREATININE 0.76 0.71  CALCIUM 8.9 8.3*   PT/INR  Recent Labs  03/14/16 0419  LABPROT 13.1  INR 0.99     Recent Labs Lab 03/12/16 0535 03/13/16 2200 03/14/16 0419 03/15/16 0331  AST 35 49* 245* 67*  ALT 19 82* 159* 114*  ALKPHOS 85 88 92 88  BILITOT 0.5 0.5 0.7 0.6  PROT 7.1 7.4 6.4* 6.2*  ALBUMIN 4.0 4.4 3.7 3.5     Lipase     Component Value Date/Time   LIPASE 52 (H) 03/15/2016 0331     Studies/Results: No results found. Prior to Admission medications   Medication Sig Start Date End Date Taking? Authorizing Provider  albuterol (PROVENTIL HFA;VENTOLIN HFA) 108 (90 BASE) MCG/ACT inhaler Inhale 1 puff into the lungs every 6 (six) hours as needed for wheezing or shortness of breath. 02/23/15  Yes Lucretia Kern, DO  cephALEXin (KEFLEX) 500 MG capsule Take 1 capsule (500 mg total) by mouth 2 (two) times daily. 03/12/16 03/17/16 Yes Roxanna Mew, PA-C  dicyclomine (BENTYL) 20 MG tablet Take 1 tablet (20 mg total) by mouth 2 (two) times daily. 03/12/16  Yes Roxanna Mew, PA-C  escitalopram (LEXAPRO) 20 MG tablet TAKE 1 TABLET (20 MG TOTAL) BY MOUTH DAILY. 01/25/16  Yes Lucretia Kern, DO  Fluticasone-Salmeterol (ADVAIR) 250-50 MCG/DOSE AEPB Inhale 1 puff into the lungs daily as needed (for shortness of breath). 02/23/15  Yes Lucretia Kern, DO  levothyroxine (SYNTHROID, LEVOTHROID) 75 MCG tablet Take one tablet daily 5 days a week Patient taking differently: Take 75 mcg by mouth as directed. Take one tablet daily 5 days a week on Monday, Tuesday, Thursday, Friday, and Saturday. 08/21/15  Yes Lucretia Kern, DO  levothyroxine (SYNTHROID, LEVOTHROID) 88 MCG tablet Take one pill 2 days a week. Patient taking differently: Take 88 mcg by mouth 2 (two) times a week. Sunday & Wednesday. 11/10/15  Yes Lucretia Kern, DO  loratadine (CLARITIN) 10 MG tablet Take 1 tablet (10 mg total) by mouth daily. 11/21/14  Yes Lucretia Kern, DO  naproxen (NAPROSYN) 500 MG tablet Take 1 tablet (500 mg total) by mouth 2 (two) times daily. 03/12/16  Yes Roxanna Mew, PA-C  orlistat (XENICAL) 120 MG capsule Take 1 capsule (120 mg total) by mouth 3 (three) times daily with meals. Patient not taking:  Reported on 03/12/2016 12/24/15   Lucretia Kern, DO    Medications: . enoxaparin (LOVENOX) injection  40 mg Subcutaneous Q24H  . levothyroxine  37.5 mcg Intravenous Once per day on Mon Tue Thu Fri Sat   And  . [START ON 03/16/2016] levothyroxine  44 mcg Intravenous Once per day on Sun Wed   Hypothyroid History of anxiety History of asthma Body mass index is 38.61 kg/m.  Assessment/Plan Acute gallstone pancreatitis  FEN:  NPO DVT:  Lovenox ID: No antibiotics   Plan: Discussed with Dr. Zella Richer he would like to proceed with surgery today. Patient is agreeable, preop is written.    LOS: 1 day    Nesreen Albano 03/15/2016 [phone]

## 2016-03-15 NOTE — Telephone Encounter (Addendum)
Pt called and reschedule appt due to being admitted to hosptial.

## 2016-03-15 NOTE — Op Note (Signed)
OPERATIVE NOTE-LAPAROSCOPIC CHOLECYSTECTOMY  Preoperative diagnosis:  Gallstone pancreatitis  Postoperative diagnosis:  Same  Procedure: Laparoscopic cholecystectomy with cholangiogram.  Surgeon: Jackolyn Confer, M.D.  Anesthesia: General  Indication:   This is a 45 year old female admitted with gallstone pancreatitis.  She has improved clinically and chemically and now presents for the above procedure.  Technique: She was brought to the operating room, placed supine on the operating table, and a general anesthetic was administered.  The abdominal wall was then sterilely prepped and draped.  A timeout was performed.    Local anesthetic (Marcaine) was infiltrated in the subumbilical region. A small subumbilical incision was made through the skin, subcutaneous tissue, fascia, and peritoneum entering the peritoneal cavity under direct vision. A pursestring suture of 0 Vicryl was placed around the edges of the fascia. A Hassan trocar was introduced into the peritoneal cavity and a pneumoperitoneum was created by insufflation of carbon dioxide gas. The laparoscope was introduced into the trocar and no underlying bleeding or organ injury was noted. He/She was then placed in the reverse Trendelenburg position with the right side tilted slightly up.  Three 5 mm trocars were then placed into the abdominal cavity under laparoscopic vision. One in the epigastric area, and 2 in the right upper quadrant area. The gallbladder was visualized and the fundus was grasped and retracted toward the right shoulder.  The infundibulum was mobilized with dissection close to the gallbladder and retracted laterally. The cystic duct was identified and a window was created around it. The cystic artery was also identified and a window was created around it. The critical view was achieved. A clip was placed at the neck of the gallbladder. A small incision was made in the cystic duct. A cholangiocatheter was introduced through  the anterior abdominal wall and placed in the cystic duct. A intraoperative cholangiogram was then performed.  Under real-time fluoroscopy, dilute contrast was injected into the cystic duct.  The common hepatic duct, the right and left hepatic ducts, and the common duct were all visualized. Contrast drained into the duodenum without obvious evidence of any obstructing ductal lesion. The final report is pending the Radiologist's interpretation.  The cholangiocatheter was removed, the cystic duct was clipped 3 times on the biliary side, and then the cystic duct was divided sharply. No bile leak was noted from the cystic duct stump.  The cystic artery was then clipped and divided. Following this the gallbladder was dissected free from the liver using electrocautery. The gallbladder was then placed in a retrieval bag and removed from the abdominal cavity through the subumbilical incision.  The gallbladder fossa was inspected, irrigated, and bleeding was controlled with electrocautery. Inspection showed that hemostasis was adequate and there was no evidence of bile leak.  The irrigation fluid was evacuated as much as possible.  The subumbilical trocar was removed and the fascial defect was closed by tightening and tying down the pursestring suture under laparoscopic vision.  The remaining trocars were removed and the pneumoperitoneum was released. The skin incisions were closed with 4-0 Monocryl subcuticular stitches. Steri-Strips and sterile dressings were applied.  The procedure was well-tolerated without any apparent complications. She was taken to the recovery room in satisfactory condition.

## 2016-03-15 NOTE — Anesthesia Preprocedure Evaluation (Addendum)
Anesthesia Evaluation  Patient identified by MRN, date of birth, ID band Patient awake    Reviewed: Allergy & Precautions, NPO status , Patient's Chart, lab work & pertinent test results  Airway Mallampati: I  TM Distance: >3 FB Neck ROM: Full    Dental   Pulmonary former smoker,    Pulmonary exam normal        Cardiovascular Normal cardiovascular exam     Neuro/Psych Anxiety    GI/Hepatic   Endo/Other  Hypothyroidism   Renal/GU      Musculoskeletal   Abdominal   Peds  Hematology   Anesthesia Other Findings   Reproductive/Obstetrics                             Anesthesia Physical Anesthesia Plan  ASA: II  Anesthesia Plan: General   Post-op Pain Management:    Induction: Intravenous  Airway Management Planned: Oral ETT  Additional Equipment:   Intra-op Plan:   Post-operative Plan: Extubation in OR  Informed Consent: I have reviewed the patients History and Physical, chart, labs and discussed the procedure including the risks, benefits and alternatives for the proposed anesthesia with the patient or authorized representative who has indicated his/her understanding and acceptance.     Plan Discussed with: CRNA and Surgeon  Anesthesia Plan Comments:         Anesthesia Quick Evaluation

## 2016-03-15 NOTE — Consult Note (Signed)
   [facility] CM Inpatient Consult   03/15/2016  Leylanie Henige 13-May-1971 [id number]   Came to visit Maria [name] at bedside on behalf of Link to [employer] program for [employer] employees/dependents with [employer]. Denies having any Link to Wellness needs at this time after program explained. However, agreeable to post discharge follow up call. Confirmed best contact number as [phone]. Left Link to Wellness packet and contact information at bedside. Appreciative of visit.  Marthenia Rolling, MSN-Ed, RN,BSN [facility] Liaison [phone]

## 2016-03-15 NOTE — Discharge Instructions (Signed)
Laparoscopic Cholecystectomy, Care After °Refer to this sheet in the next few weeks. These instructions provide you with information about caring for yourself after your procedure. Your health care provider may also give you more specific instructions. Your treatment has been planned according to current medical practices, but problems sometimes occur. Call your health care provider if you have any problems or questions after your procedure. °WHAT TO EXPECT AFTER THE PROCEDURE °After your procedure, it is common to have: °· Pain at your incision sites. You will be given pain medicines to control your pain. °· Mild nausea or vomiting. This should improve after the first 24 hours. °· Bloating and possible shoulder pain from the gas that was used during the procedure. This will improve after the first 24 hours. °HOME CARE INSTRUCTIONS °Incision Care °· Follow instructions from your health care provider about how to take care of your incisions. Make sure you: °¨ Wash your hands with soap and water before you change your bandage (dressing). If soap and water are not available, use hand sanitizer. °¨ Change your dressing as told by your health care provider. °¨ Leave stitches (sutures), skin glue, or adhesive strips in place. These skin closures may need to be in place for 2 weeks or longer. If adhesive strip edges start to loosen and curl up, you may trim the loose edges. Do not remove adhesive strips completely unless your health care provider tells you to do that. °· Do not take baths, swim, or use a hot tub until your health care provider approves. Ask your health care provider if you can take showers. You may only be allowed to take sponge baths for bathing. °General Instructions °· Take over-the-counter and prescription medicines only as told by your health care provider. °· Do not drive or operate heavy machinery while taking prescription pain medicine. °· Return to your normal diet as told by your health care  provider. °· Do not lift anything that is heavier than 10 lb (4.5 kg). °· Do not play contact sports for one week or until your health care provider approves. °SEEK MEDICAL CARE IF:  °· You have redness, swelling, or pain at the site of your incision. °· You have fluid, blood, or pus coming from your incision. °· You notice a bad smell coming from your incision area. °· Your surgical incisions break open. °· You have a fever. °SEEK IMMEDIATE MEDICAL CARE IF: °· You develop a rash. °· You have difficulty breathing. °· You have chest pain. °· You have increasing pain in your shoulders (shoulder strap areas). °· You faint or have dizzy episodes while you are standing. °· You have severe pain in your abdomen. °· You have nausea or vomiting that lasts for more than one day. °  °This information is not intended to replace advice given to you by your health care provider. Make sure you discuss any questions you have with your health care provider. °  °Document Released: 08/01/2005 Document Revised: 04/22/2015 Document Reviewed: 03/13/2013 °Elsevier Interactive Patient Education ©2016 Elsevier Inc[employer] °CCS ______[facility] °LAPAROSCOPIC SURGERY: POST OP INSTRUCTIONS °Always review your discharge instruction sheet given to you by the facility where your surgery was performed. °IF YOU HAVE DISABILITY OR FAMILY LEAVE FORMS, YOU MUST BRING THEM TO THE OFFICE FOR PROCESSING.   °DO NOT GIVE THEM TO YOUR DOCTOR. ° °1. A prescription for pain medication may be given to you upon discharge.  Take your pain medication as prescribed, if needed.  If narcotic   pain medicine is not needed, then you may take acetaminophen (Tylenol) or ibuprofen (Advil) as needed. °2. Take your usually prescribed medications unless otherwise directed. °3. If you need a refill on your pain medication, please contact your pharmacy.  They will contact our office to request authorization. Prescriptions will not be filled after 5pm or on  week-ends. °4. You should follow a light diet the first few days after arrival home, such as soup and crackers, etc.  Be sure to include lots of fluids daily. °5. Most patients will experience some swelling and bruising in the area of the incisions.  Ice packs will help.  Swelling and bruising can take several days to resolve.  °6. It is common to experience some constipation if taking pain medication after surgery.  Increasing fluid intake and taking a stool softener (such as Colace) will usually help or prevent this problem from occurring.  A mild laxative (Milk of Magnesia or Miralax) should be taken according to package instructions if there are no bowel movements after 48 hours. °7. Unless discharge instructions indicate otherwise, you may remove your bandages 24-48 hours after surgery, and you may shower at that time.  You may have steri-strips (small skin tapes) in place directly over the incision.  These strips should be left on the skin for 7-10 days.  If your surgeon used skin glue on the incision, you may shower in 24 hours.  The glue will flake off over the next 2-3 weeks.  Any sutures or staples will be removed at the office during your follow-up visit. °8. ACTIVITIES:  You may resume regular (light) daily activities beginning the next day--such as daily self-care, walking, climbing stairs--gradually increasing activities as tolerated.  You may have sexual intercourse when it is comfortable.  Refrain from any heavy lifting or straining until approved by your doctor. °a. You may drive when you are no longer taking prescription pain medication, you can comfortably wear a seatbelt, and you can safely maneuver your car and apply brakes. °b. RETURN TO WORK:  __________________________________________________________ °9. You should see your doctor in the office for a follow-up appointment approximately 2-3 weeks after your surgery.  Make sure that you call for this appointment within a day or two after you  arrive home to insure a convenient appointment time. °10. OTHER INSTRUCTIONS: __________________________________________________________________________________________________________________________ __________________________________________________________________________________________________________________________ °WHEN TO CALL YOUR DOCTOR: °1. Fever over 101.0 °2. Inability to urinate °3. Continued bleeding from incision. °4. Increased pain, redness, or drainage from the incision. °5. Increasing abdominal pain ° °The clinic staff is available to answer your questions during regular business hours.  Please don’t hesitate to call and ask to speak to one of the nurses for clinical concerns.  If you have a medical emergency, go to the nearest emergency room or call 911.  A surgeon from [facility] is always on call at the hospital. °[address], [address], [place], [place]  [address] ? [address] [address], [place], [place]   [address] °[phone] ? [phone] ? FAX [phone] °Web site: [email or web address] ° °

## 2016-03-16 ENCOUNTER — Encounter (HOSPITAL_COMMUNITY): Payer: Self-pay | Admitting: General Surgery

## 2016-03-16 DIAGNOSIS — K851 Biliary acute pancreatitis without necrosis or infection: Principal | ICD-10-CM

## 2016-03-16 MED ORDER — MOMETASONE FURO-FORMOTEROL FUM 200-5 MCG/ACT IN AERO
2.0000 | INHALATION_SPRAY | Freq: Two times a day (BID) | RESPIRATORY_TRACT | Status: DC
  Administered 2016-03-16 – 2016-03-17 (×2): 2 via RESPIRATORY_TRACT
  Filled 2016-03-16: qty 8.8

## 2016-03-16 MED ORDER — LORATADINE 10 MG PO TABS
10.0000 mg | ORAL_TABLET | Freq: Every day | ORAL | Status: DC
  Administered 2016-03-17: 10 mg via ORAL
  Filled 2016-03-16 (×2): qty 1

## 2016-03-16 MED ORDER — MAGNESIUM HYDROXIDE 400 MG/5ML PO SUSP
30.0000 mL | Freq: Every day | ORAL | Status: DC
  Administered 2016-03-16 – 2016-03-17 (×2): 30 mL via ORAL
  Filled 2016-03-16 (×2): qty 30

## 2016-03-16 MED ORDER — DICYCLOMINE HCL 20 MG PO TABS
20.0000 mg | ORAL_TABLET | Freq: Two times a day (BID) | ORAL | Status: DC
  Administered 2016-03-16 – 2016-03-17 (×3): 20 mg via ORAL
  Filled 2016-03-16 (×4): qty 1

## 2016-03-16 MED ORDER — ONDANSETRON 4 MG PO TBDP: 4.0000 mg | ORAL_TABLET | Freq: Four times a day (QID) | ORAL | 0 refills | Status: DC | PRN

## 2016-03-16 MED ORDER — OXYCODONE HCL 5 MG PO TABS: 5.0000 mg | ORAL_TABLET | ORAL | 0 refills | Status: DC | PRN

## 2016-03-16 MED ORDER — ESCITALOPRAM OXALATE 10 MG PO TABS
20.0000 mg | ORAL_TABLET | Freq: Every day | ORAL | Status: DC
  Administered 2016-03-16 – 2016-03-17 (×2): 20 mg via ORAL
  Filled 2016-03-16 (×2): qty 2

## 2016-03-16 NOTE — Discharge Summary (Signed)
Physician Discharge Summary  Matoya Fish [id number] DOB: 08/08/1971 DOA: 03/13/2016  PCP: Lucretia Kern., DO  Admit date: 03/13/2016 Discharge date: 03/16/2016  Recommendations for Outpatient Follow-up:  1. Pt will need to follow up with PCP in 2-3 weeks post discharge 2. Please obtain BMP to evaluate electrolytes and kidney function  Discharge Diagnoses:  Principal Problem:   Acute gallstone pancreatitis Active Problems:   Hypothyroidism   Pancreatitis, gallstone   Gallstone pancreatitis  Discharge Condition: Stable  Diet recommendation: Heart healthy diet discussed in details   Brief Narrative:  45 y.o.femalewith medical history significant of Cholelithiasis who presented to the Ed with severe RUQ abdominal pain. Abdominal pain was of sudden onset, worsening gradually and unbearable, associated with nausea and vomiting, not being able to keep anything down. Her pains are aggravated by any body movements without relief.  Assessment & Plan:   Gallstone pancreatitis - status post laparoscopic cholecystectomy with cholangiogram - Clinically stable this morning, still on clear liquids, anxious to advance diet - Appreciate surgery team following - Encourage ambulation, encouraged advancing diet - Plan for discharge when tolerating diet  Depression - Resume home medical regimen  Hypothyroid - Resume home medical regimen    DVT prophylaxis:  SQ Heparin   Code Status: Full Code  Procedures/Studies: Dg Chest 2 View  Result Date: 03/12/2016 CLINICAL DATA:  "Gallbladder attack" EXAM: CHEST  2 VIEW COMPARISON:  October 20, 2014 FINDINGS: The heart size and mediastinal contours are within normal limits. Both lungs are clear. The visualized skeletal structures are unremarkable. IMPRESSION: No active cardiopulmonary disease. Electronically Signed   By: Dorise Bullion III M.D   On: 03/12/2016 08:39  Dg Cholangiogram Operative  Result Date: 03/15/2016 CLINICAL  DATA:  Gallstones EXAM: INTRAOPERATIVE CHOLANGIOGRAM TECHNIQUE: Cholangiographic images from the C-arm fluoroscopic device were submitted for interpretation post-operatively. Please see the procedural report for the amount of contrast and the fluoroscopy time utilized. COMPARISON:  None. FINDINGS: Contrast fills the biliary tree and duodenum without filling defects in the common bile duct. IMPRESSION: Patent biliary tree. Electronically Signed   By: Marybelle Killings M.D.   On: 03/15/2016 16:19   US Abdomen Limited Ruq  Result Date: 03/12/2016 CLINICAL DATA:  Right upper quadrant pain. EXAM: US ABDOMEN LIMITED - RIGHT UPPER QUADRANT COMPARISON:  CT 12/11/2015 FINDINGS: Gallbladder: Multiple small layering gallstones, the largest 8 mm. No wall thickening or sonographic Murphy's sign. Common bile duct: Diameter: Normal caliber, 4 mm Liver: No focal lesion identified. Within normal limits in parenchymal echogenicity. IMPRESSION: Cholelithiasis.  No sonographic evidence of acute cholecystitis. Electronically Signed   By: Rolm Baptise M.D.   On: 03/12/2016 08:57   Discharge Exam: Vitals:   03/16/16 0034 03/16/16 0447  BP: 122/82 127/76  Pulse: 99 68  Resp: 17   Temp: 98.2 F (36.8 C) 98.6 F (37 C)   Vitals:   03/15/16 1940 03/15/16 2040 03/16/16 0034 03/16/16 0447  BP: 112/75 (!) 124/95 122/82 127/76  Pulse: 85 93 99 68  Resp: 17 18 17    Temp: 97.6 F (36.4 C) 97.9 F (36.6 C) 98.2 F (36.8 C) 98.6 F (37 C)  TempSrc: Oral Oral Oral Oral  SpO2: 92% 95% 95% 97%  Weight:      Height:        General: Pt is alert, follows commands appropriately, not in acute distress Cardiovascular: Regular rate and rhythm, S1/S2 +, no murmurs, no rubs, no gallops Respiratory: Clear to auscultation bilaterally, no wheezing, no crackles, no rhonchi  Abdominal: Soft, non tender, bowel sounds +, no guarding Extremities: no edema, no cyanosis, pulses palpable bilaterally DP and PT Neuro: Grossly  nonfocal  Discharge Instructions    Follow-up Information    [facility] Follow up on 03/30/2016.   Specialty:  General Surgery Why:  Your appointment is at 10:30 AM, be at the office for check in at 10 AM. Contact information: [place] [address] [phone]            The results of significant diagnostics from this hospitalization (including imaging, microbiology, ancillary and laboratory) are listed below for reference.     Microbiology: Recent Results (from the past 240 hour(s))  Urine culture     Status: None   Collection Time: 03/12/16  5:37 AM  Result Value Ref Range Status   Specimen Description URINE, RANDOM  Final   Special Requests NONE  Final   Culture NO GROWTH Performed at [facility]   Final   Report Status 03/13/2016 FINAL  Final  Surgical pcr screen     Status: None   Collection Time: 03/15/16  6:20 AM  Result Value Ref Range Status   MRSA, PCR NEGATIVE NEGATIVE Final   Staphylococcus aureus NEGATIVE NEGATIVE Final     Labs: Basic Metabolic Panel:  Recent Labs Lab 03/12/16 0535 03/13/16 2200 03/14/16 0419 03/15/16 0331  NA 138 138 138 140  K 3.1* 3.8 3.7 3.4*  CL 102 103 106 109  CO2 22 26 29 27   GLUCOSE 125* 104* 107* 100*  BUN 17 13 16 6   CREATININE 0.86 0.72 0.76 0.71  CALCIUM 10.2 9.4 8.9 8.3*   Liver Function Tests:  Recent Labs Lab 03/12/16 0535 03/13/16 2200 03/14/16 0419 03/15/16 0331  AST 35 49* 245* 67*  ALT 19 82* 159* 114*  ALKPHOS 85 88 92 88  BILITOT 0.5 0.5 0.7 0.6  PROT 7.1 7.4 6.4* 6.2*  ALBUMIN 4.0 4.4 3.7 3.5    Recent Labs Lab 03/12/16 0535 03/13/16 2200 03/14/16 0419 03/15/16 0331  LIPASE 35 955* 3,473* 52*  AMYLASE  --   --  1,748*  --    CBC:  Recent Labs Lab 03/12/16 0535 03/13/16 2200 03/14/16 0419 03/15/16 0331  WBC 12.2* 9.5 9.4 6.3  NEUTROABS  --   --  6.8  --   HGB 12.9 12.7 11.0* 11.1*  HCT 38.9 38.4 33.6* 34.5*  MCV 85.7 85.7  86.8 87.6  PLT 479* 441* 350 332   Cardiac Enzymes:  Recent Labs Lab 03/12/16 0535  TROPONINI <0.03   SIGNED: Time coordinating discharge: 30 minutes  Faye Ramsay, MD  [facility] 03/16/2016, 12:07 PM Pager [phone]  If 7PM-7AM, please contact night-coverage [email or web address] Password [email or web address]

## 2016-03-16 NOTE — Progress Notes (Signed)
1 Day Post-Op  Subjective: Some pain in RUQ.  Nausea after drinking chicken broth.  Objective: Vital signs in last 24 hours: Temp:  [97.6 F (36.4 C)-99.3 F (37.4 C)] 98.6 F (37 C) (08/02 0447) Pulse Rate:  [59-99] 68 (08/02 0447) Resp:  [12-18] 17 (08/02 0034) BP: (98-134)/(63-95) 127/76 (08/02 0447) SpO2:  [92 %-99 %] 97 % (08/02 0447) Last BM Date: 03/12/16  Intake/Output from previous day: 08/01 0701 - 08/02 0700 In: 2726.7 [P.O.:60; I.V.:2616.7; IV Piggyback:50] Out: 935 [Urine:925; Blood:10] Intake/Output this shift: Total I/O In: 600 [P.O.:600] Out: 500 [Urine:500]  PE: General- In NAD Abdomen-soft, dressings dry  Lab Results:   Recent Labs  03/14/16 0419 03/15/16 0331  WBC 9.4 6.3  HGB 11.0* 11.1*  HCT 33.6* 34.5*  PLT 350 332   BMET  Recent Labs  03/14/16 0419 03/15/16 0331  NA 138 140  K 3.7 3.4*  CL 106 109  CO2 29 27  GLUCOSE 107* 100*  BUN 16 6  CREATININE 0.76 0.71  CALCIUM 8.9 8.3*   PT/INR  Recent Labs  03/14/16 0419  LABPROT 13.1  INR 0.99   Comprehensive Metabolic Panel:    Component Value Date/Time   NA 140 03/15/2016 0331   NA 138 03/14/2016 0419   NA 139 02/19/2015 0911   K 3.4 (L) 03/15/2016 0331   K 3.7 03/14/2016 0419   K 4.0 02/19/2015 0911   CL 109 03/15/2016 0331   CL 106 03/14/2016 0419   CO2 27 03/15/2016 0331   CO2 29 03/14/2016 0419   CO2 23 02/19/2015 0911   BUN 6 03/15/2016 0331   BUN 16 03/14/2016 0419   BUN 11.5 02/19/2015 0911   CREATININE 0.71 03/15/2016 0331   CREATININE 0.76 03/14/2016 0419   CREATININE 0.8 02/19/2015 0911   GLUCOSE 100 (H) 03/15/2016 0331   GLUCOSE 107 (H) 03/14/2016 0419   GLUCOSE 96 02/19/2015 0911   CALCIUM 8.3 (L) 03/15/2016 0331   CALCIUM 8.9 03/14/2016 0419   CALCIUM 9.6 02/19/2015 0911   AST 67 (H) 03/15/2016 0331   AST 245 (H) 03/14/2016 0419   AST 13 02/19/2015 0911   ALT 114 (H) 03/15/2016 0331   ALT 159 (H) 03/14/2016 0419   ALT 14 02/19/2015 0911   ALKPHOS 88 03/15/2016 0331   ALKPHOS 92 03/14/2016 0419   ALKPHOS 84 02/19/2015 0911   BILITOT 0.6 03/15/2016 0331   BILITOT 0.7 03/14/2016 0419   BILITOT 0.23 02/19/2015 0911   PROT 6.2 (L) 03/15/2016 0331   PROT 6.4 (L) 03/14/2016 0419   PROT 6.9 02/19/2015 0911   ALBUMIN 3.5 03/15/2016 0331   ALBUMIN 3.7 03/14/2016 0419   ALBUMIN 3.5 02/19/2015 0911     Studies/Results: Dg Cholangiogram Operative  Result Date: 03/15/2016 CLINICAL DATA:  Gallstones EXAM: INTRAOPERATIVE CHOLANGIOGRAM TECHNIQUE: Cholangiographic images from the C-arm fluoroscopic device were submitted for interpretation post-operatively. Please see the procedural report for the amount of contrast and the fluoroscopy time utilized. COMPARISON:  None. FINDINGS: Contrast fills the biliary tree and duodenum without filling defects in the common bile duct. IMPRESSION: Patent biliary tree. Electronically Signed   By: Marybelle Killings M.D.   On: 03/15/2016 16:19    Anti-infectives: Anti-infectives    Start     Dose/Rate Route Frequency Ordered Stop   03/16/16 0600  cefTRIAXone (ROCEPHIN) 2 g in dextrose 5 % 50 mL IVPB     2 g 100 mL/hr over 30 Minutes Intravenous On call to O.R. 03/15/16 1254 03/15/16 1521  Assessment Acute gallstone pancreatitis s/p lap chole with IOC 03/15/16-postop pain and nausea; not ready for discharge currently.  LOS: 2 days   Plan: Ambulate.  Discharge when tolerating diet.   Domnick Chervenak J 03/16/2016

## 2016-03-17 ENCOUNTER — Other Ambulatory Visit: Payer: 59

## 2016-03-17 ENCOUNTER — Encounter: Payer: 59 | Admitting: Genetic Counselor

## 2016-03-17 LAB — CBC
HCT: 31.7 % — ABNORMAL LOW (ref 36.0–46.0)
Hemoglobin: 10.4 g/dL — ABNORMAL LOW (ref 12.0–15.0)
MCH: 28.7 pg (ref 26.0–34.0)
MCHC: 32.8 g/dL (ref 30.0–36.0)
MCV: 87.6 fL (ref 78.0–100.0)
PLATELETS: 306 10*3/uL (ref 150–400)
RBC: 3.62 MIL/uL — AB (ref 3.87–5.11)
RDW: 14.6 % (ref 11.5–15.5)
WBC: 8.5 10*3/uL (ref 4.0–10.5)

## 2016-03-17 LAB — BASIC METABOLIC PANEL
Anion gap: 7 (ref 5–15)
BUN: 8 mg/dL (ref 6–20)
CALCIUM: 8 mg/dL — AB (ref 8.9–10.3)
CO2: 24 mmol/L (ref 22–32)
CREATININE: 0.75 mg/dL (ref 0.44–1.00)
Chloride: 108 mmol/L (ref 101–111)
GFR calc Af Amer: >60 mL/min (ref >60)
Glucose, Bld: 92 mg/dL (ref 65–99)
POTASSIUM: 3.1 mmol/L — AB (ref 3.5–5.1)
SODIUM: 139 mmol/L (ref 135–145)

## 2016-03-17 NOTE — Discharge Summary (Signed)
Physician Discharge Summary  Phiona Gault [id number] DOB: 1971/05/16 DOA: 03/13/2016  PCP: Lucretia Kern., DO  Admit date: 03/13/2016 Discharge date: 03/17/2016  Recommendations for Outpatient Follow-up:  1. Pt will need to follow up with PCP in 2-3 weeks post discharge 2. Please obtain BMP to evaluate electrolytes and kidney function  Discharge Diagnoses:  Principal Problem:   Acute gallstone pancreatitis Active Problems:   Hypothyroidism   Pancreatitis, gallstone   Gallstone pancreatitis  Discharge Condition: Stable  Diet recommendation: Heart healthy diet discussed in details   Brief Narrative:  45 y.o.femalewith medical history significant of Cholelithiasis who presented to the Ed with severe RUQ abdominal pain. Abdominal pain was of sudden onset, worsening gradually and unbearable, associated with nausea and vomiting, not being able to keep anything down. Her pains are aggravated by any body movements without relief.  Assessment & Plan:   Gallstone pancreatitis - status post laparoscopic cholecystectomy with cholangiogram - Clinically stable this morning, still on clear liquids, anxious to advance diet - Appreciate surgery team following - Encourage ambulation, encouraged advancing diet - Plan for discharge this afternoon  Depression - Resume home medical regimen  Hypothyroid - Resume home medical regimen  Hypokalemia - supplemented, BMP to be checked prior to discharge    DVT prophylaxis:  SQ Heparin   Code Status: Full Code  Procedures/Studies: Dg Chest 2 View  Result Date: 03/12/2016 CLINICAL DATA:  "Gallbladder attack" EXAM: CHEST  2 VIEW COMPARISON:  October 20, 2014 FINDINGS: The heart size and mediastinal contours are within normal limits. Both lungs are clear. The visualized skeletal structures are unremarkable. IMPRESSION: No active cardiopulmonary disease. Electronically Signed   By: Dorise Bullion III M.D   On: 03/12/2016  08:39  Dg Cholangiogram Operative  Result Date: 03/15/2016 CLINICAL DATA:  Gallstones EXAM: INTRAOPERATIVE CHOLANGIOGRAM TECHNIQUE: Cholangiographic images from the C-arm fluoroscopic device were submitted for interpretation post-operatively. Please see the procedural report for the amount of contrast and the fluoroscopy time utilized. COMPARISON:  None. FINDINGS: Contrast fills the biliary tree and duodenum without filling defects in the common bile duct. IMPRESSION: Patent biliary tree. Electronically Signed   By: Marybelle Killings M.D.   On: 03/15/2016 16:19   US Abdomen Limited Ruq  Result Date: 03/12/2016 CLINICAL DATA:  Right upper quadrant pain. EXAM: US ABDOMEN LIMITED - RIGHT UPPER QUADRANT COMPARISON:  CT 12/11/2015 FINDINGS: Gallbladder: Multiple small layering gallstones, the largest 8 mm. No wall thickening or sonographic Murphy's sign. Common bile duct: Diameter: Normal caliber, 4 mm Liver: No focal lesion identified. Within normal limits in parenchymal echogenicity. IMPRESSION: Cholelithiasis.  No sonographic evidence of acute cholecystitis. Electronically Signed   By: Rolm Baptise M.D.   On: 03/12/2016 08:57   Discharge Exam: Vitals:   03/16/16 2129 03/17/16 0454  BP: (!) 103/57 (!) 106/59  Pulse: 71 61  Resp: 17 16  Temp: 98.2 F (36.8 C) 98.1 F (36.7 C)   Vitals:   03/16/16 1943 03/16/16 2129 03/17/16 0454 03/17/16 0925  BP:  (!) 103/57 (!) 106/59   Pulse:  71 61   Resp:  17 16   Temp:  98.2 F (36.8 C) 98.1 F (36.7 C)   TempSrc:  Oral Oral   SpO2: 100% 96% 93% 97%  Weight:      Height:        General: Pt is alert, follows commands appropriately, not in acute distress Cardiovascular: Regular rate and rhythm, S1/S2 +, no murmurs, no rubs, no gallops Respiratory: Clear to auscultation  bilaterally, no wheezing, no crackles, no rhonchi Abdominal: Soft, non tender, bowel sounds +, no guarding Extremities: no edema, no cyanosis, pulses palpable bilaterally DP and  PT Neuro: Grossly nonfocal  Discharge Instructions   Medication List    STOP taking these medications   cephALEXin 500 MG capsule Commonly known as:  KEFLEX   orlistat 120 MG capsule Commonly known as:  XENICAL     TAKE these medications   albuterol 108 (90 Base) MCG/ACT inhaler Commonly known as:  PROVENTIL HFA;VENTOLIN HFA Inhale 1 puff into the lungs every 6 (six) hours as needed for wheezing or shortness of breath.   dicyclomine 20 MG tablet Commonly known as:  BENTYL Take 1 tablet (20 mg total) by mouth 2 (two) times daily.   escitalopram 20 MG tablet Commonly known as:  LEXAPRO TAKE 1 TABLET (20 MG TOTAL) BY MOUTH DAILY.   Fluticasone-Salmeterol 250-50 MCG/DOSE Aepb Commonly known as:  ADVAIR Inhale 1 puff into the lungs daily as needed (for shortness of breath).   levothyroxine 75 MCG tablet Commonly known as:  SYNTHROID, LEVOTHROID Take one tablet daily 5 days a week What changed:  how much to take  how to take this  when to take this  additional instructions   levothyroxine 88 MCG tablet Commonly known as:  SYNTHROID, LEVOTHROID Take one pill 2 days a week. What changed:  how much to take  how to take this  when to take this  additional instructions   loratadine 10 MG tablet Commonly known as:  CLARITIN Take 1 tablet (10 mg total) by mouth daily.   naproxen 500 MG tablet Commonly known as:  NAPROSYN Take 1 tablet (500 mg total) by mouth 2 (two) times daily.   ondansetron 4 MG disintegrating tablet Commonly known as:  ZOFRAN-ODT Take 1 tablet (4 mg total) by mouth every 6 (six) hours as needed for nausea.   oxyCODONE 5 MG immediate release tablet Commonly known as:  Oxy IR/ROXICODONE Take 1-2 tablets (5-10 mg total) by mouth every 4 (four) hours as needed for moderate pain.         Follow-up Information    [facility] Follow up on 03/30/2016.   Specialty:  General Surgery Why:  Your appointment is at 10:30 AM, be at  the office for check in at 10 AM. Contact information: [address] [place] [address] [phone]        Colin Benton R., DO .   Specialty:  Family Medicine Contact information: [place] [address] [phone]            The results of significant diagnostics from this hospitalization (including imaging, microbiology, ancillary and laboratory) are listed below for reference.     Microbiology: Recent Results (from the past 240 hour(s))  Urine culture     Status: None   Collection Time: 03/12/16  5:37 AM  Result Value Ref Range Status   Specimen Description URINE, RANDOM  Final   Special Requests NONE  Final   Culture NO GROWTH Performed at [facility]   Final   Report Status 03/13/2016 FINAL  Final  Surgical pcr screen     Status: None   Collection Time: 03/15/16  6:20 AM  Result Value Ref Range Status   MRSA, PCR NEGATIVE NEGATIVE Final   Staphylococcus aureus NEGATIVE NEGATIVE Final     Labs: Basic Metabolic Panel:  Recent Labs Lab 03/12/16 0535 03/13/16 2200 03/14/16 0419 03/15/16 0331  NA 138 138 138 140  K 3.1* 3.8 3.7 3.4*  CL 102 103 106 109  CO2 22 26 29 27   GLUCOSE 125* 104* 107* 100*  BUN 17 13 16 6   CREATININE 0.86 0.72 0.76 0.71  CALCIUM 10.2 9.4 8.9 8.3*   Liver Function Tests:  Recent Labs Lab 03/12/16 0535 03/13/16 2200 03/14/16 0419 03/15/16 0331  AST 35 49* 245* 67*  ALT 19 82* 159* 114*  ALKPHOS 85 88 92 88  BILITOT 0.5 0.5 0.7 0.6  PROT 7.1 7.4 6.4* 6.2*  ALBUMIN 4.0 4.4 3.7 3.5    Recent Labs Lab 03/12/16 0535 03/13/16 2200 03/14/16 0419 03/15/16 0331  LIPASE 35 955* 3,473* 52*  AMYLASE  --   --  1,748*  --    CBC:  Recent Labs Lab 03/12/16 0535 03/13/16 2200 03/14/16 0419 03/15/16 0331  WBC 12.2* 9.5 9.4 6.3  NEUTROABS  --   --  6.8  --   HGB 12.9 12.7 11.0* 11.1*  HCT 38.9 38.4 33.6* 34.5*  MCV 85.7 85.7 86.8 87.6  PLT 479* 441* 350 332   Cardiac  Enzymes:  Recent Labs Lab 03/12/16 0535  TROPONINI <0.03   SIGNED: Time coordinating discharge: 30 minutes  Faye Ramsay, MD  [facility] 03/17/2016, 10:47 AM Pager [phone]  If 7PM-7AM, please contact night-coverage [email or web address] Password [email or web address]

## 2016-03-17 NOTE — Progress Notes (Signed)
2 Days Post-Op  Subjective: For discharge today she hopes.  She says she cannot sleep here.  Tolerating liquids, but no solid food so far.  She is very sore and tender.  Sites all look good.   Objective: Vital signs in last 24 hours: Temp:  [98.1 F (36.7 C)-98.4 F (36.9 C)] 98.1 F (36.7 C) (08/03 0454) Pulse Rate:  [61-71] 61 (08/03 0454) Resp:  [16-17] 16 (08/03 0454) BP: (103-118)/(57-69) 106/59 (08/03 0454) SpO2:  [93 %-100 %] 97 % (08/03 0925) Last BM Date: 03/12/16 1560 PO yesterday Afebrile, VSS No labs Intake/Output from previous day: 08/02 0701 - 08/03 0700 In: 1560 [P.O.:1560] Out: 500 [Urine:500] Intake/Output this shift: No intake/output data recorded.  General appearance: alert, cooperative and no distress GI: soft, sore, sites all look good.  Lab Results:   Recent Labs  03/15/16 0331  WBC 6.3  HGB 11.1*  HCT 34.5*  PLT 332    BMET  Recent Labs  03/15/16 0331  NA 140  K 3.4*  CL 109  CO2 27  GLUCOSE 100*  BUN 6  CREATININE 0.71  CALCIUM 8.3*   PT/INR No results for input(s): LABPROT, INR in the last 72 hours.   Recent Labs Lab 03/12/16 0535 03/13/16 2200 03/14/16 0419 03/15/16 0331  AST 35 49* 245* 67*  ALT 19 82* 159* 114*  ALKPHOS 85 88 92 88  BILITOT 0.5 0.5 0.7 0.6  PROT 7.1 7.4 6.4* 6.2*  ALBUMIN 4.0 4.4 3.7 3.5     Lipase     Component Value Date/Time   LIPASE 52 (H) 03/15/2016 0331     Studies/Results: Dg Cholangiogram Operative  Result Date: 03/15/2016 CLINICAL DATA:  Gallstones EXAM: INTRAOPERATIVE CHOLANGIOGRAM TECHNIQUE: Cholangiographic images from the C-arm fluoroscopic device were submitted for interpretation post-operatively. Please see the procedural report for the amount of contrast and the fluoroscopy time utilized. COMPARISON:  None. FINDINGS: Contrast fills the biliary tree and duodenum without filling defects in the common bile duct. IMPRESSION: Patent biliary tree. Electronically Signed   By: Marybelle Killings M.D.   On: 03/15/2016 16:19    Medications: . dicyclomine  20 mg Oral BID  . escitalopram  20 mg Oral Daily  . heparin subcutaneous  5,000 Units Subcutaneous Q8H  . levothyroxine  75 mcg Oral Once per day on Mon Tue Thu Fri Sat  . levothyroxine  88 mcg Oral Once per day on Sun Wed  . loratadine  10 mg Oral Daily  . magnesium hydroxide  30 mL Oral Daily  . mometasone-formoterol  2 puff Inhalation BID    Assessment/Plan GAllstone pancreatitis S/p laparoscopic cholecystectomy FEN:  Soft diet ID:  Pre op only DVT:  Heparin  Plan:  She is to go home today.  AVS has follow up from our office for follow up.  I recommended she eat some real food prior to going home and to be sure PO pain meds work for her.  She can have Tylenol, Ibuprofen, or current narcotic for pain control.         LOS: 3 days    Gloris Shiroma 03/17/2016 [phone]

## 2016-03-17 NOTE — Progress Notes (Signed)
PHARMACIST - PHYSICIAN COMMUNICATION CONCERNING: IV to Oral Route Change Policy  RECOMMENDATION: This patient is receiving diphenhydramine by the intravenous route.  Based on criteria approved by the Pharmacy and Therapeutics Committee, intravenous diphenhydramine is being converted to the equivalent oral dose form(s).   DESCRIPTION: These criteria include:  Diphenhydramine is not prescribed to treat or prevent a severe allergic reaction  Diphenhydramine is not prescribed as premedication prior to receiving blood product, biologic medication, antimicrobial, or chemotherapy agent  The patient has tolerated at least one dose of an oral or enteral medication  The patient has no evidence of active gastrointestinal bleeding or impaired GI absorption (gastrectomy, short bowel, patient on TNA or NPO).  The patient is not undergoing procedural sedation   If you have questions about this conversion, please contact the Pharmacy Department  []   [phone] )  [facility] []   [phone] )  [facility] []   [phone] )  [facility] []   [phone] )  [facility] [x]   [phone] )  Rodriguez Hevia, PharmD, BCPS 03/17/2016, 12:00 PM  Pager: [phone]

## 2016-03-17 NOTE — Care Management Note (Signed)
Case Management Note  Patient Details  Name: Maria [name] MRN: [id number] Date of Birth: 28-Aug-1970  Subjective/Objective:      45 yo admitted with Acute gallstone pancreatitis              Action/Plan: From home with family. Chart reviewed and no CM needs identified or communicated.  Expected Discharge Date:  03/18/16               Expected Discharge Plan:  Home/Self Care  In-House Referral:     Discharge planning Services  CM Consult  Post Acute Care Choice:    Choice offered to:     DME Arranged:    DME Agency:     HH Arranged:    HH Agency:     Status of Service:  In process, will continue to follow  If discussed at Long Length of Stay Meetings, dates discussed:    Additional CommentsLynnell Catalan, RN 03/17/2016, 12:32 PM  [phone]

## 2016-03-18 ENCOUNTER — Other Ambulatory Visit: Payer: Self-pay | Admitting: *Deleted

## 2016-03-18 NOTE — Patient Outreach (Signed)
[place] [facility]) Care Management  03/18/2016  Maria [name] 1971-07-13 [id number]   Subjective: Telephone call to patient's home / mobile number, no answer, left HIPAA compliant voicemail message, and requested call back.  Objective: Per chart review:  Patient hospitalized  03/13/16 - 03/17/16 for acute gallstone pancreatitis.  Status post laparoscopic cholecystectomy with cholangiogram on 03/15/16.   Patient also has history of depression and hypothyroid.  Assessment: Received [facility] Transition of care referral on 03/17/16.   Transition of care follow up, pending patient contact.   Plan: RNCM will call patient for 2nd telephonic outreach attempt, transition of care follow up, within 10 business days, if no return call.   Etoy Mcdonnell H. Annia Friendly, BSN, [place] Management [facility] Telephonic CM Phone: [phone] Fax: [phone]

## 2016-03-21 ENCOUNTER — Ambulatory Visit: Payer: Self-pay | Admitting: *Deleted

## 2016-03-21 ENCOUNTER — Other Ambulatory Visit: Payer: Self-pay | Admitting: *Deleted

## 2016-03-21 NOTE — Patient Outreach (Signed)
[place] [facility]) Care Management  03/21/2016  Maria [name] 11-07-1970 [id number]  Subjective: Received voicemail message from patient , states she is returning [facility] call, and requested call back. Telephone call to patient's home / mobile number, no answer, left HIPAA compliant voicemail message, and requested call back.  Objective: Per chart review:  Patient hospitalized  03/13/16 - 03/17/16 for acute gallstone pancreatitis.  Status post laparoscopic cholecystectomy with cholangiogram on 03/15/16.   Patient also has history of depression and hypothyroid.  Assessment: Received [facility] Transition of care referral on 03/17/16.   Transition of care follow up, pending patient contact.   Plan: RNCM will call patient for 3rd telephonic outreach attempt, transition of care follow up, within 10 business days, if no return call.   Maria [name] H. Annia Friendly, BSN, [place] Management [facility] Telephonic CM Phone: [phone] Fax: [phone]

## 2016-03-22 ENCOUNTER — Other Ambulatory Visit: Payer: Self-pay | Admitting: *Deleted

## 2016-03-22 ENCOUNTER — Encounter: Payer: Self-pay | Admitting: *Deleted

## 2016-03-22 ENCOUNTER — Ambulatory Visit: Payer: Self-pay | Admitting: *Deleted

## 2016-03-22 NOTE — Patient Outreach (Signed)
[facility] [facility]) Care Management  03/22/2016  Maria [name] [birth year] [id number]   Subjective: Telephone call to patient's home / mobile number, no answer, left HIPAA compliant voicemail message, and requested call back. Telephone call from patient and HIPAA verified.  Patient states she is doing well.   Discussed [facility] Care Management [facility] Transition of care follow up and care management services.  Patient is in agreement to complete [facility]MR Transition of care follow up. Patient states she has an appointment with her primary MD at the end of September 2017 and will call the office to get sooner appointment, due to recent hospitalization.  Patient states she does not have any transition of care, care coordination, disease management, disease monitoring, community resource, pharmacy, or transportation needs at this time.   Patient in agreement to receive [facility] Care Management information.  Objective: Per chart review:  Patient hospitalized  03/13/16 - 03/17/16 for acute gallstone pancreatitis.  Status post laparoscopic cholecystectomy with cholangiogram on 03/15/16.   Patient also has history of depression and hypothyroid.  Assessment: Received [facility] Transition of care referral on 03/17/16. Transition of care follow up completed and no care coordination needs at this time.  No Telephonic RNCM needs at this time.    Plan: RNCM will send patient successful outreach letter, [facility] pamphlet, and magnet. RNCM will send case closure due to follow up completed / no care management needs request to [facility].     Faolan [place] H. Annia Friendly, BSN, [place] Management [facility] Telephonic CM Phone: [phone] Fax: [phone]

## 2016-03-24 ENCOUNTER — Encounter: Payer: Self-pay | Admitting: Family Medicine

## 2016-03-24 ENCOUNTER — Ambulatory Visit (INDEPENDENT_AMBULATORY_CARE_PROVIDER_SITE_OTHER): Payer: 59 | Admitting: Family Medicine

## 2016-03-24 ENCOUNTER — Other Ambulatory Visit: Payer: Self-pay | Admitting: Family Medicine

## 2016-03-24 VITALS — BP 104/82 | HR 82 | Temp 98.1°F | Ht 59.0 in | Wt 184.7 lb

## 2016-03-24 DIAGNOSIS — K851 Biliary acute pancreatitis without necrosis or infection: Secondary | ICD-10-CM

## 2016-03-24 DIAGNOSIS — M79604 Pain in right leg: Secondary | ICD-10-CM

## 2016-03-24 DIAGNOSIS — K8020 Calculus of gallbladder without cholecystitis without obstruction: Secondary | ICD-10-CM

## 2016-03-24 LAB — BASIC METABOLIC PANEL
BUN: 11 mg/dL (ref 6–23)
CALCIUM: 9.3 mg/dL (ref 8.4–10.5)
CO2: 25 meq/L (ref 19–32)
CREATININE: 0.75 mg/dL (ref 0.40–1.20)
Chloride: 105 mEq/L (ref 96–112)
GFR: 88.37 mL/min (ref >60.00)
GLUCOSE: 78 mg/dL (ref 70–99)
Potassium: 4.3 mEq/L (ref 3.5–5.1)
Sodium: 138 mEq/L (ref 135–145)

## 2016-03-24 LAB — CBC
HEMATOCRIT: 38.8 % (ref 36.0–46.0)
HEMOGLOBIN: 13.1 g/dL (ref 12.0–15.0)
MCHC: 33.9 g/dL (ref 30.0–36.0)
MCV: 85.1 fl (ref 78.0–100.0)
PLATELETS: 439 10*3/uL — AB (ref 150.0–400.0)
RBC: 4.57 Mil/uL (ref 3.87–5.11)
RDW: 15.1 % (ref 11.5–15.5)
WBC: 10 10*3/uL (ref 4.0–10.5)

## 2016-03-24 LAB — VITAMIN B12: Vitamin B-12: 412 pg/mL (ref 211–911)

## 2016-03-24 NOTE — Addendum Note (Signed)
Addended by: Agnes Lawrence on: 03/24/2016 03:04 PM   Modules accepted: Orders

## 2016-03-24 NOTE — Patient Instructions (Signed)
BEFORE YOU LEAVE: -follow up: 2 weeks -details of lower extremity DVT ultrasound -labs  Get the ultrasound and labs.

## 2016-03-24 NOTE — Progress Notes (Signed)
Pre visit review using our clinic review tool, if applicable. No additional management support is needed unless otherwise documented below in the visit note. 

## 2016-03-24 NOTE — Progress Notes (Signed)
HPI:  Acute visit for:  Leg pain: -R>L but bilateral -she is worried about a R leg DVT as was hospitalized for GB pancreatitis  and had chole surgery recently -started about 5 days ago -reports achy and tingling pain in the R post calf and some in the L at times too -not relieved with ibuprofen, has pain pills from surgery but has not used -denies: trauma, weakness, fevers, SOB, numbness, swelling or redness -o/w feels is recovering well from recent GB surgery  ROS: See pertinent positives and negatives per HPI.  Past Medical History:  Diagnosis Date  . Abnormal mammogram of left breast 01/22/2016  . Anemia 11/21/2014   -with elevated platelets   . Anxiety   . Asthma   . Breast cyst 11/21/2014   -L breast, upper L just off from center -stable per radiology 10/2014   . Generalized anxiety disorder 11/21/2014  . Hypothyroidism   . Menorrhagia 11/21/2014   -seeing gyn    . Obesity 11/21/2014  . Plantar fasciitis of left foot 11/21/2014  . Seasonal allergies 11/21/2014  . Thyroid disease     Past Surgical History:  Procedure Laterality Date  . ABDOMINAL HYSTERECTOMY    . BREAST LUMPECTOMY WITH RADIOACTIVE SEED LOCALIZATION Left 01/22/2016   Procedure: LEFT BREAST LUMPECTOMY WITH RADIOACTIVE SEED LOCALIZATION;  Surgeon: Fanny Skates, MD;  Location: [place];  Service: General;  Laterality: Left;  . CESAREAN SECTION     x3  . CHOLECYSTECTOMY N/A 03/15/2016   Procedure: LAPAROSCOPIC CHOLECYSTECTOMY WITH INTRAOPERATIVE CHOLANGIOGRAM;  Surgeon: Jackolyn Confer, MD;  Location: [facility];  Service: General;  Laterality: N/A;  . CYSTO N/A 10/06/2015   Procedure: Consuela Mimes;  Surgeon: Nunzio Cobbs, MD;  Location: [facility];  Service: Gynecology;  Laterality: N/A;  . TUBAL LIGATION      Family History  Problem Relation Age of Onset  . Breast cancer Mother   . Breast cancer Paternal Grandmother   . Cervical cancer Maternal Grandmother   . Arthritis Maternal Grandmother   .  CVA Maternal Grandmother     Social History   Social History  . Marital status: Married    Spouse name: N/A  . Number of children: N/A  . Years of education: N/A   Social History Main Topics  . Smoking status: Former Smoker    Types: Cigarettes  . Smokeless tobacco: Never Used  . Alcohol use 0.6 oz/week    1 Glasses of wine per week     Comment: socially  . Drug use: No  . Sexual activity: Yes    Partners: Male    Birth control/ protection: Surgical     Comment: Tubal   Other Topics Concern  . None   Social History Narrative  . None     Current Outpatient Prescriptions:  .  albuterol (PROVENTIL HFA;VENTOLIN HFA) 108 (90 BASE) MCG/ACT inhaler, Inhale 1 puff into the lungs every 6 (six) hours as needed for wheezing or shortness of breath., Disp: 3 Inhaler, Rfl: 0 .  escitalopram (LEXAPRO) 20 MG tablet, TAKE 1 TABLET (20 MG TOTAL) BY MOUTH DAILY., Disp: 90 tablet, Rfl: 1 .  Fluticasone-Salmeterol (ADVAIR) 250-50 MCG/DOSE AEPB, Inhale 1 puff into the lungs daily as needed (for shortness of breath)., Disp: 60 each, Rfl: 3 .  levothyroxine (SYNTHROID, LEVOTHROID) 75 MCG tablet, Take one tablet daily 5 days a week (Patient taking differently: Take 75 mcg by mouth as directed. Take one tablet daily 5 days a week on Monday,  Tuesday, Thursday, Friday, and Saturday.), Disp: 60 tablet, Rfl: 3 .  levothyroxine (SYNTHROID, LEVOTHROID) 88 MCG tablet, Take one pill 2 days a week. (Patient taking differently: Take 88 mcg by mouth 2 (two) times a week. Sunday & Wednesday.), Disp: 24 tablet, Rfl: 1 .  loratadine (CLARITIN) 10 MG tablet, Take 1 tablet (10 mg total) by mouth daily., Disp: 90 tablet, Rfl: 0 .  oxyCODONE (OXY IR/ROXICODONE) 5 MG immediate release tablet, Take 1-2 tablets (5-10 mg total) by mouth every 4 (four) hours as needed for moderate pain., Disp: 30 tablet, Rfl: 0  EXAM:  Vitals:   03/24/16 1403  BP: 104/82  Pulse: 82  Temp: 98.1 F (36.7 C)    Body mass index is  37.3 kg/m.  GENERAL: vitals reviewed and listed above, alert, oriented, appears well hydrated and in no acute distress  HEENT: atraumatic, conjunttiva clear, no obvious abnormalities on inspection of external nose and ears  NECK: no obvious masses on inspection  LUNGS: clear to auscultation bilaterally, no wheezes, rales or rhonchi, good air movement  CV: HRRR, no peripheral edema  MS: moves all extremities without noticeable abnormality, normal inspection of bilat LE without redness or swelling or skin changes, TTP in the post R calf, normal strength and sensitivity to touch, DTRs in bilat LEs, normal SLRT, CLRT, FABER and FADIR  PSYCH: pleasant and cooperative, no obvious depression or anxiety  ASSESSMENT AND PLAN:  Discussed the following assessment and plan:  Pain of right lower extremity - Plan: CBC (no diff), Vitamin 123456, Basic metabolic panel, VAS [name] LOWER EXTREMITY VENOUS (DVT)  -labs and [name] pending, if ok suspect related to recovery from surgery and some deconditioning and would plan gentle LE exercises and symptomatic care -Patient advised to return or notify a doctor immediately if symptoms worsen or persist or new concerns arise.  Patient Instructions  BEFORE YOU LEAVE: -follow up: 2 weeks -details of lower extremity DVT ultrasound -labs  Get the ultrasound and labs.     Colin Benton R., DO

## 2016-03-25 ENCOUNTER — Telehealth: Payer: Self-pay | Admitting: *Deleted

## 2016-03-25 ENCOUNTER — Other Ambulatory Visit: Payer: 59

## 2016-03-25 NOTE — Telephone Encounter (Signed)
Patient calling to re-schedule appt with genetic counseling, call forwarded to val @ dr magrinat's desk

## 2016-03-25 NOTE — Telephone Encounter (Signed)
This RN spoke with pt per her call to [facility] stating she needs to reschedule appointment with genetics/ Jeanine Luz with lab post visit on 8/17.  Informed pt this note will be sent to genetics for her to be contacted for rescheduling.

## 2016-03-29 NOTE — Telephone Encounter (Signed)
Rescheduling Maria [name]'s appt to 9/28 at 9 AM.  Labs will follow at 10 AM.  Advised her to come 20 minutes early to check in.

## 2016-03-31 ENCOUNTER — Other Ambulatory Visit: Payer: 59

## 2016-03-31 ENCOUNTER — Encounter: Payer: 59 | Admitting: Genetic Counselor

## 2016-04-19 ENCOUNTER — Telehealth: Payer: Self-pay | Admitting: *Deleted

## 2016-04-19 NOTE — Telephone Encounter (Signed)
Per Dr Maudie Mercury I called the pt and asked her why she declined to have the ultrasound.  Maria [name] stated she does not want to have the test as she is feeling better and the leg pain is now gone and this was forwarded to Dr Maudie Mercury.

## 2016-04-20 ENCOUNTER — Encounter (HOSPITAL_COMMUNITY): Payer: 59

## 2016-05-03 ENCOUNTER — Other Ambulatory Visit: Payer: Self-pay | Admitting: Family Medicine

## 2016-05-04 ENCOUNTER — Other Ambulatory Visit: Payer: 59

## 2016-05-04 ENCOUNTER — Encounter: Payer: 59 | Admitting: Genetic Counselor

## 2016-05-05 DIAGNOSIS — M659 Synovitis and tenosynovitis, unspecified: Secondary | ICD-10-CM | POA: Diagnosis not present

## 2016-05-05 DIAGNOSIS — M71572 Other bursitis, not elsewhere classified, left ankle and foot: Secondary | ICD-10-CM | POA: Diagnosis not present

## 2016-05-05 DIAGNOSIS — M7672 Peroneal tendinitis, left leg: Secondary | ICD-10-CM | POA: Diagnosis not present

## 2016-05-11 NOTE — Progress Notes (Deleted)
HPI:  Maria [name] is a pleasant 45 yo here for follow up:  Leg pain: -referred for duplex which she canceled  Depression and Anxiety: -chronic, doing great -meds: lexapro; on celexa in the past  Allergies/Asthma: -mes: advair, flonase, claritin, alb -stable  Hypothyroidism: -meds: levothyroxine 75 x5 days, 88 x2 days -wonders if can increase dose further -labs done with gyn recently -tsh normal 02/2016  Obesity:  -meds: orlistat - started 08/20/15 - not sure why wt done that day not in chart (212lbs 2 days earlier --> 191 --> 184 (8/17) --> -feels this is working well, no sig side effects -exercising and eating better  Menorrhagia with Anemia: -now s/p hysterectomy 10/14/15 -anemia resolved labs 02/2016    ROS: See pertinent positives and negatives per HPI.  Past Medical History:  Diagnosis Date  . Abnormal mammogram of left breast 01/22/2016  . Anemia 11/21/2014   -with elevated platelets   . Anxiety   . Asthma   . Breast cyst 11/21/2014   -L breast, upper L just off from center -stable per radiology 10/2014   . Generalized anxiety disorder 11/21/2014  . Hypothyroidism   . Menorrhagia 11/21/2014   -seeing gyn    . Obesity 11/21/2014  . Plantar fasciitis of left foot 11/21/2014  . Seasonal allergies 11/21/2014  . Thyroid disease     Past Surgical History:  Procedure Laterality Date  . ABDOMINAL HYSTERECTOMY    . BREAST LUMPECTOMY WITH RADIOACTIVE SEED LOCALIZATION Left 01/22/2016   Procedure: LEFT BREAST LUMPECTOMY WITH RADIOACTIVE SEED LOCALIZATION;  Surgeon: Fanny Skates, MD;  Location: [place];  Service: General;  Laterality: Left;  . CESAREAN SECTION     x3  . CHOLECYSTECTOMY N/A 03/15/2016   Procedure: LAPAROSCOPIC CHOLECYSTECTOMY WITH INTRAOPERATIVE CHOLANGIOGRAM;  Surgeon: Jackolyn Confer, MD;  Location: [facility];  Service: General;  Laterality: N/A;  . CYSTO N/A 10/06/2015   Procedure: Consuela Mimes;  Surgeon: Nunzio Cobbs, MD;   Location: [facility];  Service: Gynecology;  Laterality: N/A;  . TUBAL LIGATION      Family History  Problem Relation Age of Onset  . Breast cancer Mother   . Breast cancer Paternal Grandmother   . Cervical cancer Maternal Grandmother   . Arthritis Maternal Grandmother   . CVA Maternal Grandmother     Social History   Social History  . Marital status: Married    Spouse name: N/A  . Number of children: N/A  . Years of education: N/A   Social History Main Topics  . Smoking status: Former Smoker    Types: Cigarettes  . Smokeless tobacco: Never Used  . Alcohol use 0.6 oz/week    1 Glasses of wine per week     Comment: socially  . Drug use: No  . Sexual activity: Yes    Partners: Male    Birth control/ protection: Surgical     Comment: Tubal   Other Topics Concern  . Not on file   Social History Narrative  . No narrative on file     Current Outpatient Prescriptions:  .  albuterol (PROVENTIL HFA;VENTOLIN HFA) 108 (90 BASE) MCG/ACT inhaler, Inhale 1 puff into the lungs every 6 (six) hours as needed for wheezing or shortness of breath., Disp: 3 Inhaler, Rfl: 0 .  escitalopram (LEXAPRO) 20 MG tablet, TAKE 1 TABLET (20 MG TOTAL) BY MOUTH DAILY., Disp: 90 tablet, Rfl: 1 .  Fluticasone-Salmeterol (ADVAIR) 250-50 MCG/DOSE AEPB, Inhale 1 puff into the lungs daily as needed (for  shortness of breath)., Disp: 60 each, Rfl: 3 .  levothyroxine (SYNTHROID, LEVOTHROID) 75 MCG tablet, Take one tablet daily 5 days a week (Patient taking differently: Take 75 mcg by mouth as directed. Take one tablet daily 5 days a week on Monday, Tuesday, Thursday, Friday, and Saturday.), Disp: 60 tablet, Rfl: 3 .  levothyroxine (SYNTHROID, LEVOTHROID) 75 MCG tablet, TAKE 1 TABLET BY MOUTH DAILY BEFORE BREAKFAST., Disp: 90 tablet, Rfl: 1 .  levothyroxine (SYNTHROID, LEVOTHROID) 88 MCG tablet, TAKE ONE TABLET BY MOUTH TWO DAYS A WEEK AS DIRECTED, Disp: 24 tablet, Rfl: 1 .  loratadine (CLARITIN) 10 MG tablet, Take  1 tablet (10 mg total) by mouth daily., Disp: 90 tablet, Rfl: 0 .  oxyCODONE (OXY IR/ROXICODONE) 5 MG immediate release tablet, Take 1-2 tablets (5-10 mg total) by mouth every 4 (four) hours as needed for moderate pain., Disp: 30 tablet, Rfl: 0  EXAM:  There were no vitals filed for this visit.  There is no height or weight on file to calculate BMI.  GENERAL: vitals reviewed and listed above, alert, oriented, appears well hydrated and in no acute distress  HEENT: atraumatic, conjunttiva clear, no obvious abnormalities on inspection of external nose and ears  NECK: no obvious masses on inspection  LUNGS: clear to auscultation bilaterally, no wheezes, rales or rhonchi, good air movement  CV: HRRR, no peripheral edema  MS: moves all extremities without noticeable abnormality  PSYCH: pleasant and cooperative, no obvious depression or anxiety  ASSESSMENT AND PLAN:  Discussed the following assessment and plan:  No diagnosis found. -flu vaccine offered -lifestyle recs -due for lipid screen - will plan fasting labs at preventive visit in 3 months -Patient advised to return or notify a doctor immediately if symptoms worsen or persist or new concerns arise.  There are no Patient Instructions on file for this visit.  Colin Benton R., DO

## 2016-05-12 ENCOUNTER — Ambulatory Visit (HOSPITAL_BASED_OUTPATIENT_CLINIC_OR_DEPARTMENT_OTHER): Payer: 59 | Admitting: Genetic Counselor

## 2016-05-12 ENCOUNTER — Other Ambulatory Visit: Payer: 59

## 2016-05-12 ENCOUNTER — Encounter: Payer: Self-pay | Admitting: Genetic Counselor

## 2016-05-12 ENCOUNTER — Telehealth: Payer: Self-pay | Admitting: *Deleted

## 2016-05-12 ENCOUNTER — Ambulatory Visit: Payer: 59 | Admitting: Family Medicine

## 2016-05-12 DIAGNOSIS — Z840 Family history of diseases of the skin and subcutaneous tissue: Secondary | ICD-10-CM | POA: Diagnosis not present

## 2016-05-12 DIAGNOSIS — Z803 Family history of malignant neoplasm of breast: Secondary | ICD-10-CM | POA: Insufficient documentation

## 2016-05-12 DIAGNOSIS — Z808 Family history of malignant neoplasm of other organs or systems: Secondary | ICD-10-CM

## 2016-05-12 DIAGNOSIS — Z8049 Family history of malignant neoplasm of other genital organs: Secondary | ICD-10-CM | POA: Diagnosis not present

## 2016-05-12 DIAGNOSIS — Z809 Family history of malignant neoplasm, unspecified: Secondary | ICD-10-CM

## 2016-05-12 DIAGNOSIS — Z800 Family history of malignant neoplasm of digestive organs: Secondary | ICD-10-CM | POA: Insufficient documentation

## 2016-05-12 DIAGNOSIS — Z0289 Encounter for other administrative examinations: Secondary | ICD-10-CM

## 2016-05-12 DIAGNOSIS — Z315 Encounter for genetic counseling: Secondary | ICD-10-CM

## 2016-05-12 NOTE — Progress Notes (Addendum)
REFERRING PROVIDER: Fanny Skates, MD  PRIMARY PROVIDER:  Lucretia Kern., DO  PRIMARY REASON FOR VISIT:  1. Family history of breast cancer in female   2. Family history of colorectal cancer   3. Family history of uterine cancer   4. Family history of nonmelanoma skin cancer   5. Family history of cancer      HISTORY OF PRESENT ILLNESS:   Maria [name], a 45 y.o. female, was seen for a [facility] cancer genetics consultation at the request of Dr. Dalbert Batman due to a family history of early-onset breast cancer and other cancers.  Ms. Brisby presents to clinic today with her husband to discuss the possibility of a hereditary predisposition to cancer, genetic testing, and to further clarify her future cancer risks, as well as potential cancer risks for family members.   Ms. Shiffman is a 45 y.o. female with no personal history of cancer.  Ms. 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  HORMONAL RISK FACTORS:  Menarche was at age 30-13y.  First live birth at age 47.  OCP use for approximately 15-20 years.  Ovaries intact: yes.  Hysterectomy: yes, in February 2017 due to anemia.  Menopausal status: uncertain/has had a hysterectomy.  HRT use: 0 years. Colonoscopy: no; not examined. Mammogram within the last year: yes. Number of breast biopsies: 1. Up to date with pelvic exams:  yes. 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 Abnormal mammogram of left breast 01/22/2016  . Anemia 11/21/2014   -with elevated platelets   . Anxiety   . Asthma   . Breast cyst 11/21/2014   -L breast, upper L just off from center -stable per radiology  10/2014   . Generalized anxiety disorder 11/21/2014  . Hypothyroidism   . Menorrhagia 11/21/2014   -seeing gyn    . Obesity 11/21/2014  . Plantar fasciitis of left foot 11/21/2014  . Seasonal allergies 11/21/2014  . Thyroid disease     Past Surgical History:  Procedure Laterality Date  . ABDOMINAL HYSTERECTOMY    . BREAST LUMPECTOMY WITH RADIOACTIVE SEED LOCALIZATION Left 01/22/2016   Procedure: LEFT BREAST LUMPECTOMY WITH RADIOACTIVE SEED LOCALIZATION;  Surgeon: Fanny Skates, MD;  Location: [place];  Service: General;  Laterality: Left;  . CESAREAN SECTION     x3  . CHOLECYSTECTOMY N/A 03/15/2016   Procedure: LAPAROSCOPIC CHOLECYSTECTOMY WITH INTRAOPERATIVE CHOLANGIOGRAM;  Surgeon: Jackolyn Confer, MD;  Location: [facility];  Service: General;  Laterality: N/A;  . CYSTO N/A 10/06/2015   Procedure: Consuela Mimes;  Surgeon: Nunzio Cobbs, MD;  Location: [facility];  Service: Gynecology;  Laterality: N/A;  . TUBAL LIGATION      Social History   Social History  . Marital status: Married    Spouse name: N/A  . Number of children: N/A  . Years of education: N/A   Social History Main Topics  . Smoking status: Former Smoker    Types: Cigarettes  . Smokeless tobacco: Never Used  . Alcohol use 0.6 oz/week    1 Glasses of wine per week     Comment: socially  . Drug use: No  . Sexual activity: Yes  Partners: Male    Birth control/ protection: Surgical     Comment: Tubal   Other Topics Concern  . Not on file   Social History Narrative  . No narrative on file     FAMILY HISTORY:  We obtained a detailed, 4-generation family history.  Significant diagnoses are listed below: Family History  Problem Relation Age of Onset  . Breast cancer Mother 63    s/p lump and rad  . Skin cancer Mother     non-melanoma  . Skin cancer Father     non-melanoma  . Breast cancer Paternal Grandmother     dx 64s; s/p mastectomy  . Cervical cancer Maternal Grandmother   . Arthritis  Maternal Grandmother   . CVA Maternal Grandmother   . Stroke Maternal Grandmother 80  . Heart attack Maternal Grandfather     d. 108s  . Colon cancer Paternal Grandfather 56  . Rectal cancer Maternal Uncle 54  . Aortic aneurysm Maternal Uncle 53  . Uterine cancer Other     maternal great grandmother (MGM's mother)  . Stroke Other   . Cancer Other     maternal great grandfather (MGM's father); NOS cancer  . Breast cancer Other     maternal great grandmother (MGF's mother)  . Colon cancer Other     maternal great grandfather (MGF's father)  . Heart attack Other   . Colon cancer Other     paternal great grandfather (PGF's father)  . Heart attack Other     Ms. Marcantonio has three children--two sons and one daughter, ages 37-15.  She has one full sister and two full brothers, ages 68-48.  She reports no history of cancer in her siblings.  Ms. Poss's mother is currently 87.  She was diagnosed with breast cancer at the age of 9 and this was treated with lumpectomy and radiation.  She also has a history of non-melanoma skin cancer.  Ms. Rollins's father is currently 2, and he also has a history of non-melanoma skin cancer.  Ms. Dymond's mother has three full brothers.  Only one brother is still living--he is in his late 3s and has no cancer history and no children.  One brother died of an aortic aneurysm at 76.  He had one son and one daughter, and his son died at the age of 66 (possibly from alcohol abuse or related causes).  One brother was diagnosed with rectal cancer at 58 and passed away at 54.  He also had no children.  Ms. Pompa's maternal grandmother died of a stroke at 45.  This grandmother's parents both had cancer--her mother died of uterine cancer and her father also had a history of an unspecified type of cancer.  Ms. Sahm had limited information for her grandmother's siblings, but does mention that some of them passed away at young ages.  Ms. Ellis's maternal  grandfather died of a heart attack at 94.  He was an only child, and both of his parents also had cancer.  His mother died of breast cancer at an unspecified age and his father had a history of colon cancer at an unspecified age.    Ms. Barron's father had two full sisters and one full brother, all of whom are in their mid-late 60s and are cancer-free.  Ms. Bosher reports no history of cancer for her paternal first cousins.  Her paternal grandmother was diagnosed with breast cancer in her 40s and she passed away at 88 after a fall.  Ms. Donaway has limited information for her grandmother's siblings and parents.  Ms. Delorenzo's paternal grandfather died of colon cancer at 61.  He had two siblings for whom Ms. Shipley had no information.  His father also had a history of colon cancer, but Ms. Badolato was unaware of his age of diagnosis.    Ms. Mazzuca reports no known family history of genetic testing for hereditary cancer risks.  Patient's maternal ancestors are of [place] descent, and paternal ancestors are of [place], [place], and [name] descent. There is no reported Ashkenazi Jewish ancestry. There is no known consanguinity.  GENETIC COUNSELING ASSESSMENT: Davan Hark is a 45 y.o. female with a family history of cancer which is somewhat suggestive of a hereditary cancer syndrome and predisposition to cancer. We, therefore, discussed and recommended the following at today's visit.   DISCUSSION: We reviewed the characteristics, features and inheritance patterns of hereditary cancer syndromes, particularly those caused by mutations within the BRCA1/2, CHEK2, and Lynch syndrome genes. We also discussed genetic testing, including the appropriate family members to test, the process of testing, insurance coverage and turn-around-time for results. We discussed the implications of a negative, positive and/or variant of uncertain significant result. We recommended Ms. Portier pursue genetic testing for the  32-gene Comprehensive Cancer Panel with MSH2 Exons 1-7 Inversion Analysis through [facility] [employer] [facility].  The Comprehensive Cancer Panel offered by [facility] Junius Roads, MD) includes sequencing and/or deletion duplication testing of the following 32 genes: APC, ATM, AXIN2, BARD1, BMPR1A, BRCA1, BRCA2, BRIP1, CDH1, CDK4, CDKN2A, CHEK2, EPCAM, FANCC, MLH1, MSH2, MSH6, MUTYH, NBN, PALB2, PMS2, POLD1, POLE, PTEN, RAD51C, RAD51D, SCG5/GREM1, SMAD4, STK11, TP53, VHL, and XRCC2.    Based on Ms. Bibby's family history of cancer, she meets medical criteria for genetic testing. Despite that she meets criteria, she may still have an out of pocket cost. We discussed that if her out of pocket cost for testing is over $100, the laboratory will call and confirm whether she wants to proceed with testing.  If the out of pocket cost of testing is less than $100 she will be billed by the genetic testing laboratory.   Based on the patient's personal and family history, statistical models ([place])  and literature data were used to estimate her risk of developing breast cancer. These estimate her lifetime risk of developing breast cancer to be approximately 21.6% to 28.3%. This estimation does not take into account any genetic testing results.  The patient's lifetime breast cancer risk is a preliminary estimate based on available information using one of several models endorsed by the [place] (ACS). The ACS recommends consideration of breast MRI screening as an adjunct to mammography for patients at high risk (defined as 20% or greater lifetime risk). A more detailed breast cancer risk assessment can be considered, if clinically indicated.   PLAN: After considering the risks, benefits, and limitations, Ms. Resende  provided informed consent to pursue genetic testing and the blood sample was sent to [facility] for analysis of the 32-gene Comprehensive Cancer Panel with MSH2  Exons 1-7 Inversion Analysis. Results should be available within approximately 2-3 weeks' time, at which point they will be disclosed by telephone to Ms. Teal, as will any additional recommendations warranted by these results. Ms. Tilson will receive a summary of her genetic counseling visit and a copy of her results once available. This information will also be available in Epic. We encouraged Ms. Bennett to remain in contact with cancer genetics annually so that  we can continuously update the family history and inform her of any changes in cancer genetics and testing that may be of benefit for her family. Ms. Barret's questions were answered to her satisfaction today. Our contact information was provided should additional questions or concerns arise.  Thank you for the referral and allowing [name] to share in the care of your patient.   Jeanine Luz, MS, [facility] Certified Genetic Counselor [email or web address] Phone: [phone]  The patient was seen for a total of 60 minutes in face-to-face genetic counseling.  This patient was discussed with Drs. Magrinat, Lindi Adie and/or Burr Medico who agrees with the above.    _______________________________________________________________________ For Office Staff:  Number of people involved in session: 2 Was an Intern/ student involved with case: no

## 2016-05-12 NOTE — Telephone Encounter (Signed)
Patient was a no-show for today's appt.  I called the pt and left a detailed message to check on the pt and asked that she call back to reschedule.

## 2016-05-17 ENCOUNTER — Encounter: Payer: Self-pay | Admitting: Family Medicine

## 2016-05-19 DIAGNOSIS — Z800 Family history of malignant neoplasm of digestive organs: Secondary | ICD-10-CM | POA: Diagnosis not present

## 2016-05-19 DIAGNOSIS — Z803 Family history of malignant neoplasm of breast: Secondary | ICD-10-CM | POA: Diagnosis not present

## 2016-05-20 DIAGNOSIS — M7672 Peroneal tendinitis, left leg: Secondary | ICD-10-CM | POA: Diagnosis not present

## 2016-05-20 DIAGNOSIS — M659 Synovitis and tenosynovitis, unspecified: Secondary | ICD-10-CM | POA: Diagnosis not present

## 2016-05-27 ENCOUNTER — Telehealth: Payer: Self-pay | Admitting: Genetic Counselor

## 2016-05-27 NOTE — Telephone Encounter (Signed)
Discussed with Ms. Hosek that her genetic test results were negative for mutations within any of 32 genes on the Comprehensive Cancer Panel through [facility].  Additionally, no uncertain changes were found.  Discussed projected 21.5-28% lifetime breast cancer risk and option of breast MRIs.  She will discuss with her doctor.  Discussed that most cancer is not hereditary and that this seems to be reassuring for [name].  Her siblings (or her father) and her paternal relatives are eligible for genetic testing as well due to her paternal grandmother's age at onset.  She would like a mailed an emailed copy of her test result and I am happy to do that.  She knows she is welcome to call with any questions she may have.

## 2016-05-30 ENCOUNTER — Ambulatory Visit: Payer: Self-pay | Admitting: Genetic Counselor

## 2016-05-30 DIAGNOSIS — Z1379 Encounter for other screening for genetic and chromosomal anomalies: Secondary | ICD-10-CM

## 2016-05-30 DIAGNOSIS — Z8049 Family history of malignant neoplasm of other genital organs: Secondary | ICD-10-CM

## 2016-05-30 DIAGNOSIS — Z809 Family history of malignant neoplasm, unspecified: Secondary | ICD-10-CM

## 2016-05-30 DIAGNOSIS — Z803 Family history of malignant neoplasm of breast: Secondary | ICD-10-CM

## 2016-05-30 DIAGNOSIS — Z800 Family history of malignant neoplasm of digestive organs: Secondary | ICD-10-CM

## 2016-06-07 ENCOUNTER — Ambulatory Visit: Payer: 59 | Admitting: Family Medicine

## 2016-06-09 ENCOUNTER — Encounter: Payer: Self-pay | Admitting: Family Medicine

## 2016-06-09 ENCOUNTER — Ambulatory Visit (INDEPENDENT_AMBULATORY_CARE_PROVIDER_SITE_OTHER): Payer: 59 | Admitting: Family Medicine

## 2016-06-09 VITALS — BP 116/82 | HR 82 | Temp 97.8°F | Ht 59.0 in | Wt 185.3 lb

## 2016-06-09 DIAGNOSIS — F411 Generalized anxiety disorder: Secondary | ICD-10-CM

## 2016-06-09 DIAGNOSIS — E038 Other specified hypothyroidism: Secondary | ICD-10-CM

## 2016-06-09 DIAGNOSIS — Z6837 Body mass index (BMI) 37.0-37.9, adult: Secondary | ICD-10-CM | POA: Diagnosis not present

## 2016-06-09 DIAGNOSIS — Z23 Encounter for immunization: Secondary | ICD-10-CM

## 2016-06-09 MED ORDER — VENLAFAXINE HCL ER 37.5 MG PO CP24: 37.5000 mg | ORAL_CAPSULE | Freq: Every day | ORAL | 3 refills | Status: DC

## 2016-06-09 NOTE — Progress Notes (Signed)
Pre visit review using our clinic review tool, if applicable. No additional management support is needed unless otherwise documented below in the visit note. 

## 2016-06-09 NOTE — Patient Instructions (Signed)
BEFORE YOU LEAVE: -flu shot -follow up: 6-8 weeks -lab for thyroid check  Taper off lexapro then start the effexor and take once daily.  Consider counseling for anxiety.   We recommend the following healthy lifestyle for LIFE: 1) Small portions.   Tip: eat off of a salad plate instead of a dinner plate.  Tip: It is ok to feel hungry after a meal   Tip: if you need more or a snack choose fruits, veggies and/or a handful of nuts or seeds.  2) Eat a healthy clean diet.  * Tip: Avoid (less then 1 serving per week): processed foods, sweets, sweetened drinks, white starches (rice, flour, bread, potatoes, pasta, etc), red meat, fast foods, butter  *Tip: CHOOSE instead   * 5-9 servings per day of fresh or frozen fruits and vegetables (but not corn, potatoes, bananas, canned or dried fruit)   *nuts and seeds, beans   *olives and olive oil   *small portions of lean meats such as fish and white chicken    *small portions of whole grains  3)Get at least 150 minutes of sweaty aerobic exercise per week.  4)Reduce stress - consider counseling, meditation and relaxation to balance other aspects of your life.

## 2016-06-09 NOTE — Progress Notes (Signed)
HPI:  Acute visit for:  Obesity: -struggles with this -orlistat helped but can't tolerate since GB removed -exercising on regular basis and eating healthy the last 2 months and she is frustrated -building muscle -she wants to stop the lexapro and consider other options for anxiety, reports her anxiety is mild even of meds - just tends to get anxious, no panic disorder -she does not want to do counseling, she does feels she needs to continue a medication for anxiety -she also wants to recheck thyroid -denies: depression, worsening anxiety, hot/cold changes, skin changes    ROS: See pertinent positives and negatives per HPI.  Past Medical History:  Diagnosis Date  . Abnormal mammogram of left breast 01/22/2016  . Anemia 11/21/2014   -with elevated platelets   . Anxiety   . Asthma   . Breast cyst 11/21/2014   -L breast, upper L just off from center -stable per radiology 10/2014   . Generalized anxiety disorder 11/21/2014  . Hypothyroidism   . Menorrhagia 11/21/2014   -seeing gyn    . Obesity 11/21/2014  . Plantar fasciitis of left foot 11/21/2014  . Seasonal allergies 11/21/2014  . Thyroid disease     Past Surgical History:  Procedure Laterality Date  . ABDOMINAL HYSTERECTOMY    . BREAST LUMPECTOMY WITH RADIOACTIVE SEED LOCALIZATION Left 01/22/2016   Procedure: LEFT BREAST LUMPECTOMY WITH RADIOACTIVE SEED LOCALIZATION;  Surgeon: Fanny Skates, MD;  Location: [facility];  Service: General;  Laterality: Left;  . CESAREAN SECTION     x3  . CHOLECYSTECTOMY N/A 03/15/2016   Procedure: LAPAROSCOPIC CHOLECYSTECTOMY WITH INTRAOPERATIVE CHOLANGIOGRAM;  Surgeon: Jackolyn Confer, MD;  Location: [facility];  Service: General;  Laterality: N/A;  . CYSTO N/A 10/06/2015   Procedure: Consuela Mimes;  Surgeon: Nunzio Cobbs, MD;  Location: [facility];  Service: Gynecology;  Laterality: N/A;  . TUBAL LIGATION      Family History  Problem Relation Age of Onset  . Breast cancer Mother 16   s/p lump and rad  . Skin cancer Mother     non-melanoma  . Skin cancer Father     non-melanoma  . Breast cancer Paternal Grandmother     dx 22s; s/p mastectomy  . Cervical cancer Maternal Grandmother   . Arthritis Maternal Grandmother   . CVA Maternal Grandmother   . Stroke Maternal Grandmother 80  . Heart attack Maternal Grandfather     d. 53s  . Colon cancer Paternal Grandfather 65  . Rectal cancer Maternal Uncle 54  . Aortic aneurysm Maternal Uncle 53  . Uterine cancer Other     maternal great grandmother (MGM's mother)  . Stroke Other   . Cancer Other     maternal great grandfather (MGM's father); NOS cancer  . Breast cancer Other     maternal great grandmother (MGF's mother)  . Colon cancer Other     maternal great grandfather (MGF's father)  . Heart attack Other   . Colon cancer Other     paternal great grandfather (PGF's father)  . Heart attack Other     Social History   Social History  . Marital status: Married    Spouse name: N/A  . Number of children: N/A  . Years of education: N/A   Social History Main Topics  . Smoking status: Former Smoker    Types: Cigarettes  . Smokeless tobacco: Never Used  . Alcohol use 0.6 oz/week    1 Glasses of wine per week  Comment: socially  . Drug use: No  . Sexual activity: Yes    Partners: Male    Birth control/ protection: Surgical     Comment: Tubal   Other Topics Concern  . None   Social History Narrative  . None     Current Outpatient Prescriptions:  .  albuterol (PROVENTIL HFA;VENTOLIN HFA) 108 (90 BASE) MCG/ACT inhaler, Inhale 1 puff into the lungs every 6 (six) hours as needed for wheezing or shortness of breath., Disp: 3 Inhaler, Rfl: 0 .  escitalopram (LEXAPRO) 20 MG tablet, TAKE 1 TABLET (20 MG TOTAL) BY MOUTH DAILY., Disp: 90 tablet, Rfl: 1 .  Fluticasone-Salmeterol (ADVAIR) 250-50 MCG/DOSE AEPB, Inhale 1 puff into the lungs daily as needed (for shortness of breath)., Disp: 60 each, Rfl: 3 .   levothyroxine (SYNTHROID, LEVOTHROID) 75 MCG tablet, Take one tablet daily 5 days a week (Patient taking differently: Take 75 mcg by mouth as directed. Take one tablet daily 5 days a week on Monday, Tuesday, Thursday, Friday, and Saturday.), Disp: 60 tablet, Rfl: 3 .  levothyroxine (SYNTHROID, LEVOTHROID) 88 MCG tablet, TAKE ONE TABLET BY MOUTH TWO DAYS A WEEK AS DIRECTED, Disp: 24 tablet, Rfl: 1 .  loratadine (CLARITIN) 10 MG tablet, Take 1 tablet (10 mg total) by mouth daily., Disp: 90 tablet, Rfl: 0 .  venlafaxine XR (EFFEXOR XR) 37.5 MG 24 hr capsule, Take 1 capsule (37.5 mg total) by mouth daily with breakfast., Disp: 30 capsule, Rfl: 3  EXAM:  Vitals:   06/09/16 1443  BP: 116/82  Pulse: 82  Temp: 97.8 F (36.6 C)    Body mass index is 37.43 kg/m.  GENERAL: vitals reviewed and listed above, alert, oriented, appears well hydrated and in no acute distress  HEENT: atraumatic, conjunttiva clear, no obvious abnormalities on inspection of external nose and ears  NECK: no obvious masses on inspection  LUNGS: clear to auscultation bilaterally, no wheezes, rales or rhonchi, good air movement  CV: HRRR, no peripheral edema  MS: moves all extremities without noticeable abnormality  PSYCH: pleasant and cooperative, no obvious depression or anxiety  ASSESSMENT AND PLAN:  Discussed the following assessment and plan:  Other specified hypothyroidism - Plan: TSH  BMI 37.0-37.9, adult  Generalized anxiety disorder  -discussed most of the meds for the anxiety other then benzos which I would not advise in her case can cause wt gain - effexor possibly less - she wante dot make this change -advised longterm commitment to lifestyle changes -check thyroid -advised of other options for wt loss including medications and surgery - she does not wish to do these -Patient advised to return or notify a doctor immediately if symptoms worsen or persist or new concerns arise.  Patient Instructions   BEFORE YOU LEAVE: -flu shot -follow up: 6-8 weeks -lab for thyroid check  Taper off lexapro then start the effexor and take once daily.  Consider counseling for anxiety.   We recommend the following healthy lifestyle for LIFE: 1) Small portions.   Tip: eat off of a salad plate instead of a dinner plate.  Tip: It is ok to feel hungry after a meal   Tip: if you need more or a snack choose fruits, veggies and/or a handful of nuts or seeds.  2) Eat a healthy clean diet.  * Tip: Avoid (less then 1 serving per week): processed foods, sweets, sweetened drinks, white starches (rice, flour, bread, potatoes, pasta, etc), red meat, fast foods, butter  *Tip: CHOOSE instead   *  5-9 servings per day of fresh or frozen fruits and vegetables (but not corn, potatoes, bananas, canned or dried fruit)   *nuts and seeds, beans   *olives and olive oil   *small portions of lean meats such as fish and white chicken    *small portions of whole grains  3)Get at least 150 minutes of sweaty aerobic exercise per week.  4)Reduce stress - consider counseling, meditation and relaxation to balance other aspects of your life.      Colin Benton R., DO

## 2016-06-13 LAB — TSH: TSH: 2.26 u[IU]/mL (ref 0.35–4.50)

## 2016-06-20 DIAGNOSIS — Z1379 Encounter for other screening for genetic and chromosomal anomalies: Secondary | ICD-10-CM | POA: Insufficient documentation

## 2016-06-20 NOTE — Progress Notes (Signed)
GENETIC TEST RESULT  HPI: Maria [name] was previously seen in the [facility] due to a family history of breast, colon, and other cancers and concerns regarding a hereditary predisposition to cancer. Please refer to our prior cancer genetics clinic note from May 12, 2016 for more information regarding Maria [name]'s medical, social and family histories, and our assessment and recommendations, at the time. Maria [name]'s recent genetic test results were disclosed to her, as were recommendations warranted by these results. These results and recommendations are discussed in more detail below.  GENETIC TEST RESULTS: At the time of Maria [name]'s visit on 05/12/16, we recommended she pursue genetic testing of the 32-gene Comprehensive Cancer Panel with MSH2 Exons 1-7 Inversion Analysis through [facility].  The Comprehensive Cancer Panel offered by [facility] Junius Roads, MD) includes sequencing and/or deletion duplication testing of the following 32 genes: APC, ATM, AXIN2, BARD1, BMPR1A, BRCA1, BRCA2, BRIP1, CDH1, CDK4, CDKN2A, CHEK2, EPCAM, FANCC, MLH1, MSH2, MSH6, MUTYH, NBN, PALB2, PMS2, POLD1, POLE, PTEN, RAD51C, RAD51D, SCG5/GREM1, SMAD4, STK11, TP53, VHL, and XRCC2. Those results are now back, the report date for which is May 25, 2016.  Genetic testing was normal, and did not reveal a deleterious mutation in these genes. Additionally, no variants of uncertain significance (VUSes) were found.  The test report will be scanned into EPIC and will be located under the Results Review tab in the Pathology>Molecular Pathology section.   We discussed with Maria [name] that since the current genetic testing is not perfect, it is possible there may be a gene mutation in one of these genes that current testing cannot detect, but that chance is small. We also discussed, that it is possible that another gene that has not yet been discovered, or that we have not yet  tested, is responsible for the cancer diagnoses in the family, and it is, therefore, important to remain in touch with cancer genetics in the future so that we can continue to offer Maria [name] the most up-to-date genetic testing.   CANCER SCREENING RECOMMENDATIONS: While we still do not have an explanation for the family history of cancer, this normal result is reassuring and indicates that Maria [name] does not likely have an increased risk of cancer due to a mutation in one of these genes.  We, therefore, recommended  Maria [name] continue to follow the cancer screening guidelines provided by her primary healthcare providers. She will need to get a colonoscopy at age 63.  An IBIS/Tyrer-Cuzick risk model calculation was performed for Maria [name].  Her lifetime breast cancer risk is estimated at approximately 21.5-28%, so she is likely eligible for annual breast MRIs.  She plans to discuss this option with her doctor.    RECOMMENDATIONS FOR FAMILY MEMBERS: Women in this family might be at some increased risk of developing cancer, over the general population risk, simply due to the family history of cancer. We recommended women in this family have a yearly mammogram beginning at age 73, or 70 years younger than the earliest onset of cancer, an annual clinical breast exam, and perform monthly breast self-exams. Women in this family should also have a gynecological exam as recommended by their primary provider. All family members should have a colonoscopy by age 53.  Based on Maria [name]'s paternal family history of early-onset breast cancer in her paternal grandmother, we recommended her siblings or her father and her other paternal relatives have genetic counseling and testing. Maria [name] will let us know if we can  be of any assistance in coordinating genetic counseling and/or testing for these family members.   FOLLOW-UP: Lastly, we discussed with Maria [name] that cancer genetics is a rapidly  advancing field and it is possible that new genetic tests will be appropriate for her and/or her family members in the future. We encouraged her to remain in contact with cancer genetics on an annual basis so we can update her personal and family histories and let her know of advances in cancer genetics that may benefit this family.   Our contact number was provided. Maria [name]'s questions were answered to her satisfaction, and she knows she is welcome to call us at anytime with additional questions or concerns.   Maria Luz, MS, [facility] Certified Genetic Counselor [email or web address] Phone: [phone]

## 2016-06-27 DIAGNOSIS — M25531 Pain in right wrist: Secondary | ICD-10-CM | POA: Diagnosis not present

## 2016-07-01 ENCOUNTER — Encounter: Payer: Self-pay | Admitting: Family Medicine

## 2016-07-04 ENCOUNTER — Encounter: Payer: Self-pay | Admitting: Family Medicine

## 2016-07-05 ENCOUNTER — Other Ambulatory Visit: Payer: Self-pay | Admitting: Family Medicine

## 2016-07-05 MED ORDER — VENLAFAXINE HCL ER 75 MG PO CP24: 75.0000 mg | ORAL_CAPSULE | Freq: Every day | ORAL | 1 refills | Status: DC

## 2016-07-06 DIAGNOSIS — M25531 Pain in right wrist: Secondary | ICD-10-CM | POA: Diagnosis not present

## 2016-07-18 ENCOUNTER — Other Ambulatory Visit: Payer: Self-pay | Admitting: Orthopedic Surgery

## 2016-07-18 DIAGNOSIS — M25531 Pain in right wrist: Secondary | ICD-10-CM

## 2016-08-01 ENCOUNTER — Ambulatory Visit (INDEPENDENT_AMBULATORY_CARE_PROVIDER_SITE_OTHER): Payer: 59 | Admitting: Family Medicine

## 2016-08-01 ENCOUNTER — Encounter: Payer: Self-pay | Admitting: Family Medicine

## 2016-08-01 VITALS — BP 100/76 | HR 95 | Temp 97.8°F | Ht 59.0 in

## 2016-08-01 DIAGNOSIS — F411 Generalized anxiety disorder: Secondary | ICD-10-CM | POA: Diagnosis not present

## 2016-08-01 MED ORDER — VENLAFAXINE HCL ER 150 MG PO CP24: 150.0000 mg | ORAL_CAPSULE | Freq: Every day | ORAL | 1 refills | Status: DC

## 2016-08-01 NOTE — Patient Instructions (Signed)
BEFORE YOU LEAVE: -follow up: 3- 4 months  Take the Effexor XR 150 mg daily.  I hope you have a very Merry Christmas!

## 2016-08-01 NOTE — Progress Notes (Signed)
HPI:  Maria [name] is a pleasant 45 yo with a PMH significant for GAD, Hypothyroidism, obesity, asthma and allergies here for follow up. She recently wanted to try switch from lexapro to effexor for her anxiety as she felt lexapro was worse for her obesity. She is up to her dose at home. Now is taking 150 mg of the Effexor XR daily. She thinks this dose is working quite well. She no longer has any depression or anxiety symptoms and feels she is functioning quite well despite the holiday stress.  She did not tolerate orlistat. Thyroid level was around 2 in the end of October.   ROS: See pertinent positives and negatives per HPI.  Past Medical History:  Diagnosis Date  . Abnormal mammogram of left breast 01/22/2016  . Anemia 11/21/2014   -with elevated platelets   . Anxiety   . Asthma   . Breast cyst 11/21/2014   -L breast, upper L just off from center -stable per radiology 10/2014   . Generalized anxiety disorder 11/21/2014  . Hypothyroidism   . Menorrhagia 11/21/2014   -seeing gyn    . Obesity 11/21/2014  . Plantar fasciitis of left foot 11/21/2014  . Seasonal allergies 11/21/2014  . Thyroid disease     Past Surgical History:  Procedure Laterality Date  . ABDOMINAL HYSTERECTOMY    . BREAST LUMPECTOMY WITH RADIOACTIVE SEED LOCALIZATION Left 01/22/2016   Procedure: LEFT BREAST LUMPECTOMY WITH RADIOACTIVE SEED LOCALIZATION;  Surgeon: Fanny Skates, MD;  Location: [facility];  Service: General;  Laterality: Left;  . CESAREAN SECTION     x3  . CHOLECYSTECTOMY N/A 03/15/2016   Procedure: LAPAROSCOPIC CHOLECYSTECTOMY WITH INTRAOPERATIVE CHOLANGIOGRAM;  Surgeon: Jackolyn Confer, MD;  Location: [facility];  Service: General;  Laterality: N/A;  . CYSTO N/A 10/06/2015   Procedure: Consuela Mimes;  Surgeon: Nunzio Cobbs, MD;  Location: [facility];  Service: Gynecology;  Laterality: N/A;  . TUBAL LIGATION      Family History  Problem Relation Age of Onset  . Breast cancer Mother 30    s/p  lump and rad  . Skin cancer Mother     non-melanoma  . Skin cancer Father     non-melanoma  . Breast cancer Paternal Grandmother     dx 64s; s/p mastectomy  . Cervical cancer Maternal Grandmother   . Arthritis Maternal Grandmother   . CVA Maternal Grandmother   . Stroke Maternal Grandmother 80  . Heart attack Maternal Grandfather     d. 67s  . Colon cancer Paternal Grandfather 55  . Rectal cancer Maternal Uncle 54  . Aortic aneurysm Maternal Uncle 53  . Uterine cancer Other     maternal great grandmother (MGM's mother)  . Stroke Other   . Cancer Other     maternal great grandfather (MGM's father); NOS cancer  . Breast cancer Other     maternal great grandmother (MGF's mother)  . Colon cancer Other     maternal great grandfather (MGF's father)  . Heart attack Other   . Colon cancer Other     paternal great grandfather (PGF's father)  . Heart attack Other     Social History   Social History  . Marital status: Married    Spouse name: N/A  . Number of children: N/A  . Years of education: N/A   Social History Main Topics  . Smoking status: Former Smoker    Types: Cigarettes  . Smokeless tobacco: Never Used  . Alcohol use  0.6 oz/week    1 Glasses of wine per week     Comment: socially  . Drug use: No  . Sexual activity: Yes    Partners: Male    Birth control/ protection: Surgical     Comment: Tubal   Other Topics Concern  . None   Social History Narrative  . None     Current Outpatient Prescriptions:  .  albuterol (PROVENTIL HFA;VENTOLIN HFA) 108 (90 BASE) MCG/ACT inhaler, Inhale 1 puff into the lungs every 6 (six) hours as needed for wheezing or shortness of breath., Disp: 3 Inhaler, Rfl: 0 .  Fluticasone-Salmeterol (ADVAIR) 250-50 MCG/DOSE AEPB, Inhale 1 puff into the lungs daily as needed (for shortness of breath)., Disp: 60 each, Rfl: 3 .  levothyroxine (SYNTHROID, LEVOTHROID) 75 MCG tablet, Take one tablet daily 5 days a week (Patient taking differently:  Take 75 mcg by mouth as directed. Take one tablet daily 5 days a week on Monday, Tuesday, Thursday, Friday, and Saturday.), Disp: 60 tablet, Rfl: 3 .  levothyroxine (SYNTHROID, LEVOTHROID) 88 MCG tablet, TAKE ONE TABLET BY MOUTH TWO DAYS A WEEK AS DIRECTED, Disp: 24 tablet, Rfl: 1 .  loratadine (CLARITIN) 10 MG tablet, Take 1 tablet (10 mg total) by mouth daily., Disp: 90 tablet, Rfl: 0 .  venlafaxine XR (EFFEXOR-XR) 150 MG 24 hr capsule, Take 1 capsule (150 mg total) by mouth daily with breakfast., Disp: 90 capsule, Rfl: 1  EXAM:  Vitals:   08/01/16 1304  BP: 100/76  Pulse: 95  Temp: 97.8 F (36.6 C)    There is no height or weight on file to calculate BMI.  GENERAL: vitals reviewed and listed above, alert, oriented, appears well hydrated and in no acute distress  HEENT: atraumatic, conjunttiva clear, no obvious abnormalities on inspection of external nose and ears  NECK: no obvious masses on inspection  LUNGS: clear to auscultation bilaterally, no wheezes, rales or rhonchi, good air movement  CV: HRRR, no peripheral edema  MS: moves all extremities without noticeable abnormality  PSYCH: pleasant and cooperative, no obvious depression or anxiety  ASSESSMENT AND PLAN:  Discussed the following assessment and plan:  Generalized anxiety disorder  -We'll send a new dose of the Effexor -Advised a healthy lifestyle -Plan to check labs and follow-up in 3-4 months -Patient advised to return or notify a doctor immediately if symptoms worsen or persist or new concerns arise.  Patient Instructions  BEFORE YOU LEAVE: -follow up: 3- 4 months  Take the Effexor XR 150 mg daily.  I hope you have a very Merry Christmas!    Colin Benton R., DO

## 2016-08-01 NOTE — Progress Notes (Signed)
Pre visit review using our clinic review tool, if applicable. No additional management support is needed unless otherwise documented below in the visit note. 

## 2016-08-02 ENCOUNTER — Ambulatory Visit: Payer: 59 | Admitting: Family Medicine

## 2016-08-04 ENCOUNTER — Other Ambulatory Visit: Payer: 59

## 2016-09-12 DIAGNOSIS — H524 Presbyopia: Secondary | ICD-10-CM | POA: Diagnosis not present

## 2016-09-15 ENCOUNTER — Other Ambulatory Visit: Payer: Self-pay | Admitting: Family Medicine

## 2016-09-27 ENCOUNTER — Encounter: Payer: Self-pay | Admitting: Internal Medicine

## 2016-09-27 ENCOUNTER — Ambulatory Visit: Payer: 59 | Admitting: Family Medicine

## 2016-09-27 ENCOUNTER — Ambulatory Visit (INDEPENDENT_AMBULATORY_CARE_PROVIDER_SITE_OTHER): Payer: 59 | Admitting: Internal Medicine

## 2016-09-27 VITALS — BP 126/88 | HR 87 | Temp 98.1°F

## 2016-09-27 DIAGNOSIS — R6889 Other general symptoms and signs: Secondary | ICD-10-CM | POA: Diagnosis not present

## 2016-09-27 DIAGNOSIS — J101 Influenza due to other identified influenza virus with other respiratory manifestations: Secondary | ICD-10-CM | POA: Diagnosis not present

## 2016-09-27 DIAGNOSIS — J45901 Unspecified asthma with (acute) exacerbation: Secondary | ICD-10-CM | POA: Diagnosis not present

## 2016-09-27 LAB — POCT INFLUENZA A/B
INFLUENZA B, POC: NEGATIVE
Influenza A, POC: POSITIVE — AB

## 2016-09-27 MED ORDER — FLUTICASONE PROPIONATE 50 MCG/ACT NA SUSP: 2.0000 | Freq: Every day | NASAL | 1 refills | Status: DC

## 2016-09-27 MED ORDER — FLUTICASONE-SALMETEROL 250-50 MCG/DOSE IN AEPB: 1.0000 | INHALATION_SPRAY | Freq: Every day | RESPIRATORY_TRACT | 3 refills | Status: DC | PRN

## 2016-09-27 MED ORDER — IPRATROPIUM-ALBUTEROL 0.5-2.5 (3) MG/3ML IN SOLN
3.0000 mL | Freq: Once | RESPIRATORY_TRACT | Status: AC
  Administered 2016-09-27: 3 mL via RESPIRATORY_TRACT

## 2016-09-27 MED ORDER — ALBUTEROL SULFATE HFA 108 (90 BASE) MCG/ACT IN AERS: 1.0000 | INHALATION_SPRAY | Freq: Four times a day (QID) | RESPIRATORY_TRACT | 0 refills | Status: DC | PRN

## 2016-09-27 NOTE — Progress Notes (Signed)
Pre visit review using our clinic review tool, if applicable. No additional management support is needed unless otherwise documented below in the visit note.  Chief Complaint  Patient presents with  . Shortness of Breath    Sx started last Wednesday per pt.  . Chills  . Generalized Body Aches  . Cough  . Headache  . Sore Throat    HPI: Maria [name] 46 y.o.  sda  PCP NA Comes in today with about a 4 day history IV ache sore throat headache cough with increasing shortness of breath. She has an underlying history of asthma. She has used her albuterol but has been off of her Advair as only pretty much using this for maintenance during her peak season which is the fall. She is feeling worse by the minute comes in today for evaluation. She had the flu injection in October 17.  Has a husband and children at home who are well. No hemoptysis cough partly dry somewhat productive at times. ROS: See pertinent positives and negatives per HPI.  Past Medical History:  Diagnosis Date  . Abnormal mammogram of left breast 01/22/2016  . Anemia 11/21/2014   -with elevated platelets   . Anxiety   . Asthma   . Breast cyst 11/21/2014   -L breast, upper L just off from center -stable per radiology 10/2014   . Generalized anxiety disorder 11/21/2014  . Hypothyroidism   . Menorrhagia 11/21/2014   -seeing gyn    . Obesity 11/21/2014  . Plantar fasciitis of left foot 11/21/2014  . Seasonal allergies 11/21/2014  . Thyroid disease     Family History  Problem Relation Age of Onset  . Breast cancer Mother 47    s/p lump and rad  . Skin cancer Mother     non-melanoma  . Skin cancer Father     non-melanoma  . Breast cancer Paternal Grandmother     dx 51s; s/p mastectomy  . Cervical cancer Maternal Grandmother   . Arthritis Maternal Grandmother   . CVA Maternal Grandmother   . Stroke Maternal Grandmother 80  . Heart attack Maternal Grandfather     d. 46s  . Colon cancer Paternal Grandfather 66  . Rectal  cancer Maternal Uncle 54  . Aortic aneurysm Maternal Uncle 53  . Uterine cancer Other     maternal great grandmother (MGM's mother)  . Stroke Other   . Cancer Other     maternal great grandfather (MGM's father); NOS cancer  . Breast cancer Other     maternal great grandmother (MGF's mother)  . Colon cancer Other     maternal great grandfather (MGF's father)  . Heart attack Other   . Colon cancer Other     paternal great grandfather (PGF's father)  . Heart attack Other     Social History   Social History  . Marital status: Married    Spouse name: N/A  . Number of children: N/A  . Years of education: N/A   Social History Main Topics  . Smoking status: Former Smoker    Types: Cigarettes  . Smokeless tobacco: Never Used  . Alcohol use 0.6 oz/week    1 Glasses of wine per week     Comment: socially  . Drug use: No  . Sexual activity: Yes    Partners: Male    Birth control/ protection: Surgical     Comment: Tubal   Other Topics Concern  . None   Social History Narrative  . None  Outpatient Medications Prior to Visit  Medication Sig Dispense Refill  . levothyroxine (SYNTHROID, LEVOTHROID) 75 MCG tablet Take one tablet daily 5 days a week (Patient taking differently: Take 75 mcg by mouth as directed. Take one tablet daily 5 days a week on Monday, Tuesday, Thursday, Friday, and Saturday.) 60 tablet 3  . levothyroxine (SYNTHROID, LEVOTHROID) 88 MCG tablet TAKE ONE TABLET BY MOUTH TWO DAYS A WEEK AS DIRECTED 24 tablet 1  . loratadine (CLARITIN) 10 MG tablet Take 1 tablet (10 mg total) by mouth daily. 90 tablet 0  . venlafaxine XR (EFFEXOR-XR) 150 MG 24 hr capsule Take 1 capsule (150 mg total) by mouth daily with breakfast. 90 capsule 1  . albuterol (PROVENTIL HFA;VENTOLIN HFA) 108 (90 BASE) MCG/ACT inhaler Inhale 1 puff into the lungs every 6 (six) hours as needed for wheezing or shortness of breath. 3 Inhaler 0  . Fluticasone-Salmeterol (ADVAIR) 250-50 MCG/DOSE AEPB Inhale  1 puff into the lungs daily as needed (for shortness of breath). 60 each 3   No facility-administered medications prior to visit.      EXAM:  BP 126/88 (BP Location: Right Arm, Patient Position: Sitting, Cuff Size: Normal)   Pulse 87   Temp 98.1 F (36.7 C)   LMP 09/26/2015 (Exact Date)   SpO2 97%   There is no height or weight on file to calculate BMI. WDWN in NAD  quiet respirations; mildly congested  somewhat hoarse. Non toxic .Looks uncomfortable and ill. HEENT: Normocephalic ;atraumatic , Eyes;  PERRL, EOMs  Full, lids and conjunctiva clear,,Ears: no deformities, canals nl, TM landmarks normal, Nose: no deformity or discharge but congested;face minimally tender Mouth : OP clear without lesion or edema . Neck: Supple without adenopathy or masses or bruits Chest:  Question decreased breath sounds and tight with no rales noted. Nebulizer given with improved air movement and subjective feeling. Improved. CV:  S1-S2 no gallops or murmurs peripheral perfusion is normal Skin :nl perfusion and no acute rashes  Rapid flu test positive for group a influenza. GASSESSMENT AND PLAN:  Discussed the following assessment and plan:  Influenza A  Asthma with acute exacerbation, unspecified asthma severity, unspecified whether persistent - Plan: ipratropium-albuterol (DUONEB) 0.5-2.5 (3) MG/3ML nebulizer solution 3 mL  Flu-like symptoms - Plan: POC Influenza A/B Patient at risk but she is well past the window of i  risk  Benefit of the Tamiflu. But did discuss this with her. Rest close observation Advair refilled alarm symptoms reviewed for follow-up relapsing increasing shortness of breath etc. -Patient advised to return or notify health care team  if symptoms worsen ,persist or new concerns arise. Also asked for refill of Flonase.  Patient Instructions  This acts like flu aggravating asthma     Lungs are clear and reassuring.  Today   Restart the advail  And your albuterol.   Rest and  fluids if you're getting severe shortness of breath seek emergent care. Also seek care if you are starting to improve and then relapse. You should be feeling better in the next 48 hours but her cough may last a week or 2. With the onset of your symptoms I think Tamiflu wont be  helpful because  later in the illness.  Your family members can contact their doctors and providers if they become ill with fever respiratory illness. Because they have been explains to influenza A.   Influenza, Adult Influenza, more commonly known as "the flu," is a viral infection that primarily affects the respiratory tract.  The respiratory tract includes organs that help you breathe, such as the lungs, nose, and throat. The flu causes many common cold symptoms, as well as a high fever and body aches. The flu spreads easily from person to person (is contagious). Getting a flu shot (influenza vaccination) every year is the best way to prevent influenza. What are the causes? Influenza is caused by a virus. You can catch the virus by:  Breathing in droplets from an infected person's cough or sneeze.  Touching something that was recently contaminated with the virus and then touching your mouth, nose, or eyes. What increases the risk? The following factors may make you more likely to get the flu:  Not cleaning your hands frequently with soap and water or alcohol-based hand sanitizer.  Having close contact with many people during cold and flu season.  Touching your mouth, eyes, or nose without washing or sanitizing your hands first.  Not drinking enough fluids or not eating a healthy diet.  Not getting enough sleep or exercise.  Being under a high amount of stress.  Not getting a yearly (annual) flu shot. You may be at a higher risk of complications from the flu, such as a severe lung infection (pneumonia), if you:  Are over the age of 104.  Are pregnant.  Have a weakened disease-fighting system (immune  system). You may have a weakened immune system if you:  Have HIV or AIDS.  Are undergoing chemotherapy.  Aretaking medicines that reduce the activity of (suppress) the immune system.  Have a long-term (chronic) illness, such as heart disease, kidney disease, diabetes, or lung disease.  Have a liver disorder.  Are obese.  Have anemia. What are the signs or symptoms? Symptoms of this condition typically last 4-10 days and may include:  Fever.  Chills.  Headache, body aches, or muscle aches.  Sore throat.  Cough.  Runny or congested nose.  Chest discomfort and cough.  Poor appetite.  Weakness or tiredness (fatigue).  Dizziness.  Nausea or vomiting. How is this diagnosed? This condition may be diagnosed based on your medical history and a physical exam. Your health care provider may do a nose or throat swab test to confirm the diagnosis. How is this treated? If influenza is detected early, you can be treated with antiviral medicine that can reduce the length of your illness and the severity of your symptoms. This medicine may be given by mouth (orally) or through an IV tube that is inserted in one of your veins. The goal of treatment is to relieve symptoms by taking care of yourself at home. This may include taking over-the-counter medicines, drinking plenty of fluids, and adding humidity to the air in your home. In some cases, influenza goes away on its own. Severe influenza or complications from influenza may be treated in a hospital. Follow these instructions at home:  Take over-the-counter and prescription medicines only as told by your health care provider.  Use a cool mist humidifier to add humidity to the air in your home. This can make breathing easier.  Rest as needed.  Drink enough fluid to keep your urine clear or pale yellow.  Cover your mouth and nose when you cough or sneeze.  Wash your hands with soap and water often, especially after you cough or  sneeze. If soap and water are not available, use hand sanitizer.  Stay home from work or school as told by your health care provider. Unless you are visiting your health care provider,  try to avoid leaving home until your fever has been gone for 24 hours without the use of medicine.  Keep all follow-up visits as told by your health care provider. This is important. How is this prevented?  Getting an annual flu shot is the best way to avoid getting the flu. You may get the flu shot in late summer, fall, or winter. Ask your health care provider when you should get your flu shot.  Wash your hands often or use hand sanitizer often.  Avoid contact with people who are sick during cold and flu season.  Eat a healthy diet, drink plenty of fluids, get enough sleep, and exercise regularly. Contact a health care provider if:  You develop new symptoms.  You have:  Chest pain.  Diarrhea.  A fever.  Your cough gets worse.  You produce more mucus.  You feel nauseous or you vomit. Get help right away if:  You develop shortness of breath or difficulty breathing.  Your skin or nails turn a bluish color.  You have severe pain or stiffness in your neck.  You develop a sudden headache or sudden pain in your face or ear.  You cannot stop vomiting. This information is not intended to replace advice given to you by your health care provider. Make sure you discuss any questions you have with your health care provider. Document Released: 07/29/2000 Document Revised: 01/07/2016 Document Reviewed: 05/26/2015 Elsevier Interactive Patient Education  2017 [employer] K. Panosh M.D.

## 2016-09-27 NOTE — Patient Instructions (Addendum)
This acts like flu aggravating asthma     Lungs are clear and reassuring.  Today   Restart the advail  And your albuterol.   Rest and fluids if you're getting severe shortness of breath seek emergent care. Also seek care if you are starting to improve and then relapse. You should be feeling better in the next 48 hours but her cough may last a week or 2. With the onset of your symptoms I think Tamiflu wont be  helpful because  later in the illness.  Your family members can contact their doctors and providers if they become ill with fever respiratory illness. Because they have been explains to influenza A.   Influenza, Adult Influenza, more commonly known as "the flu," is a viral infection that primarily affects the respiratory tract. The respiratory tract includes organs that help you breathe, such as the lungs, nose, and throat. The flu causes many common cold symptoms, as well as a high fever and body aches. The flu spreads easily from person to person (is contagious). Getting a flu shot (influenza vaccination) every year is the best way to prevent influenza. What are the causes? Influenza is caused by a virus. You can catch the virus by:  Breathing in droplets from an infected person's cough or sneeze.  Touching something that was recently contaminated with the virus and then touching your mouth, nose, or eyes. What increases the risk? The following factors may make you more likely to get the flu:  Not cleaning your hands frequently with soap and water or alcohol-based hand sanitizer.  Having close contact with many people during cold and flu season.  Touching your mouth, eyes, or nose without washing or sanitizing your hands first.  Not drinking enough fluids or not eating a healthy diet.  Not getting enough sleep or exercise.  Being under a high amount of stress.  Not getting a yearly (annual) flu shot. You may be at a higher risk of complications from the flu, such as a  severe lung infection (pneumonia), if you:  Are over the age of 7.  Are pregnant.  Have a weakened disease-fighting system (immune system). You may have a weakened immune system if you:  Have HIV or AIDS.  Are undergoing chemotherapy.  Aretaking medicines that reduce the activity of (suppress) the immune system.  Have a long-term (chronic) illness, such as heart disease, kidney disease, diabetes, or lung disease.  Have a liver disorder.  Are obese.  Have anemia. What are the signs or symptoms? Symptoms of this condition typically last 4-10 days and may include:  Fever.  Chills.  Headache, body aches, or muscle aches.  Sore throat.  Cough.  Runny or congested nose.  Chest discomfort and cough.  Poor appetite.  Weakness or tiredness (fatigue).  Dizziness.  Nausea or vomiting. How is this diagnosed? This condition may be diagnosed based on your medical history and a physical exam. Your health care provider may do a nose or throat swab test to confirm the diagnosis. How is this treated? If influenza is detected early, you can be treated with antiviral medicine that can reduce the length of your illness and the severity of your symptoms. This medicine may be given by mouth (orally) or through an IV tube that is inserted in one of your veins. The goal of treatment is to relieve symptoms by taking care of yourself at home. This may include taking over-the-counter medicines, drinking plenty of fluids, and adding humidity to the air  in your home. In some cases, influenza goes away on its own. Severe influenza or complications from influenza may be treated in a hospital. Follow these instructions at home:  Take over-the-counter and prescription medicines only as told by your health care provider.  Use a cool mist humidifier to add humidity to the air in your home. This can make breathing easier.  Rest as needed.  Drink enough fluid to keep your urine clear or pale  yellow.  Cover your mouth and nose when you cough or sneeze.  Wash your hands with soap and water often, especially after you cough or sneeze. If soap and water are not available, use hand sanitizer.  Stay home from work or school as told by your health care provider. Unless you are visiting your health care provider, try to avoid leaving home until your fever has been gone for 24 hours without the use of medicine.  Keep all follow-up visits as told by your health care provider. This is important. How is this prevented?  Getting an annual flu shot is the best way to avoid getting the flu. You may get the flu shot in late summer, fall, or winter. Ask your health care provider when you should get your flu shot.  Wash your hands often or use hand sanitizer often.  Avoid contact with people who are sick during cold and flu season.  Eat a healthy diet, drink plenty of fluids, get enough sleep, and exercise regularly. Contact a health care provider if:  You develop new symptoms.  You have:  Chest pain.  Diarrhea.  A fever.  Your cough gets worse.  You produce more mucus.  You feel nauseous or you vomit. Get help right away if:  You develop shortness of breath or difficulty breathing.  Your skin or nails turn a bluish color.  You have severe pain or stiffness in your neck.  You develop a sudden headache or sudden pain in your face or ear.  You cannot stop vomiting. This information is not intended to replace advice given to you by your health care provider. Make sure you discuss any questions you have with your health care provider. Document Released: 07/29/2000 Document Revised: 01/07/2016 Document Reviewed: 05/26/2015 Elsevier Interactive Patient Education  2017 [employer].

## 2016-10-08 IMAGING — CT CT ANGIO CHEST
1 of 2 series · 19 of 32 positions shown · IV contrast (OMNIPAQUE 350)
Comparison: None.

CLINICAL DATA: Dyspnea, chest pain for 2 weeks

EXAM:
CT ANGIOGRAPHY CHEST WITH CONTRAST
TECHNIQUE: Multidetector CT imaging of the chest was performed using the
standard protocol during bolus administration of intravenous
contrast. Multiplanar CT image reconstructions and MIPs were
obtained to evaluate the vascular anatomy.
CONTRAST:  100mL OMNIPAQUE IOHEXOL 350 MG/ML SOLN

[Series 11: thins for pacs · axial · 0.70mm/px · z∈[-243,-13]mm · 19 of 256 slices shown]
[im 13/256  lung]
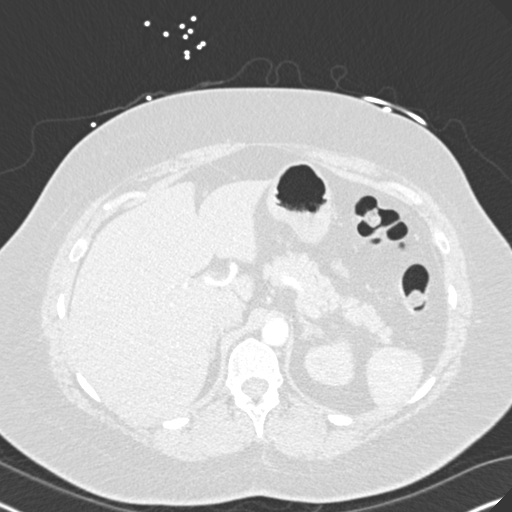
[im 26/256  mediastinal]
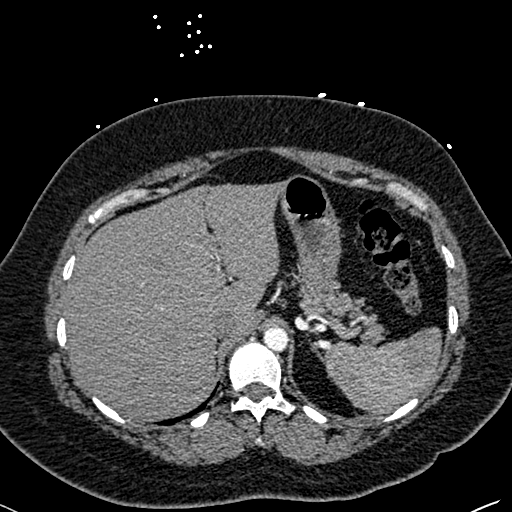
[im 39/256  lung]
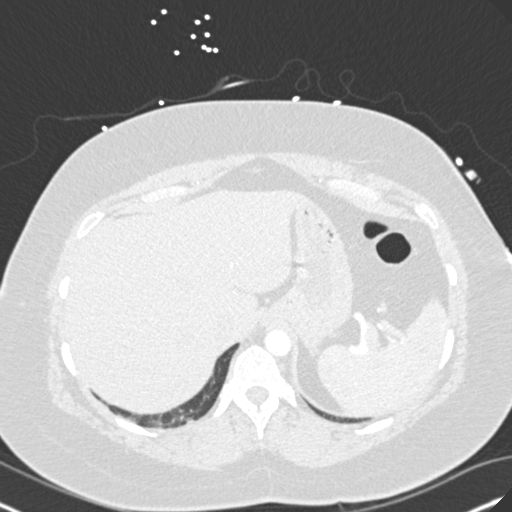
[im 64/256  mediastinal]
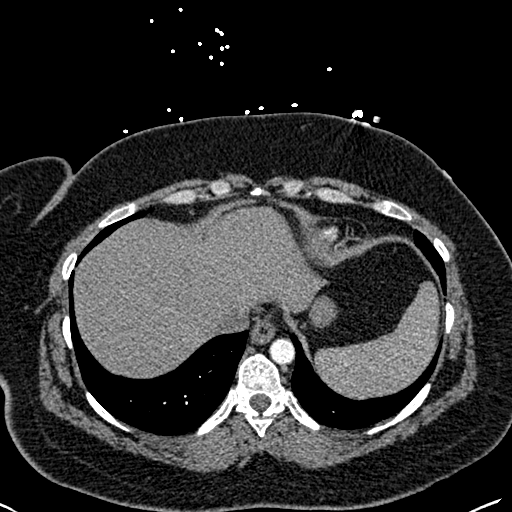
[im 77/256  lung]
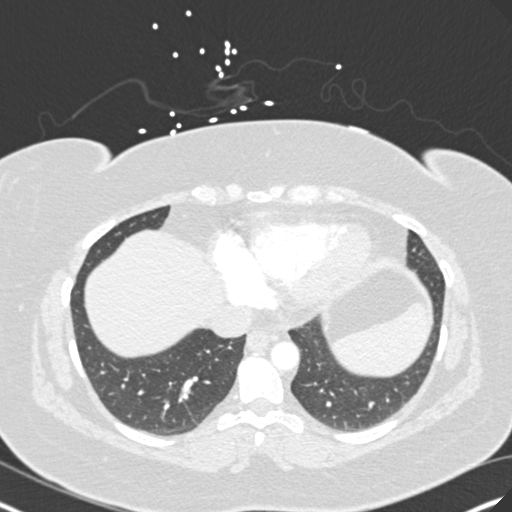
[im 86/256  mediastinal]
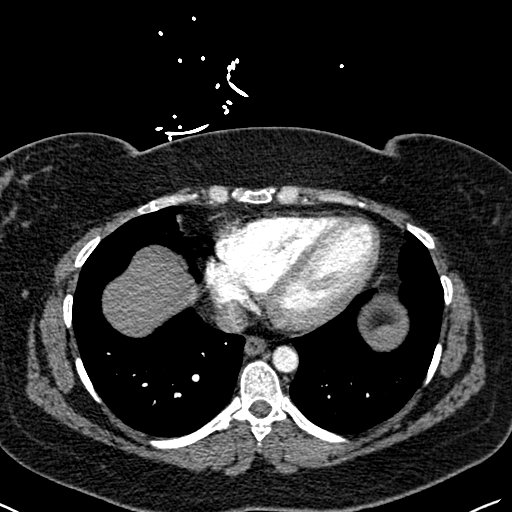
[im 90/256  lung]
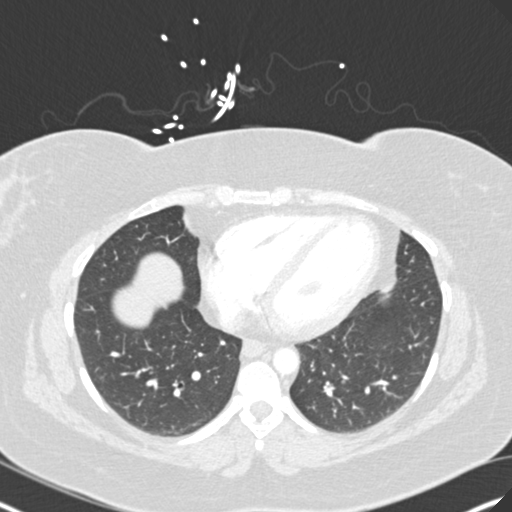
[im 103/256  mediastinal]
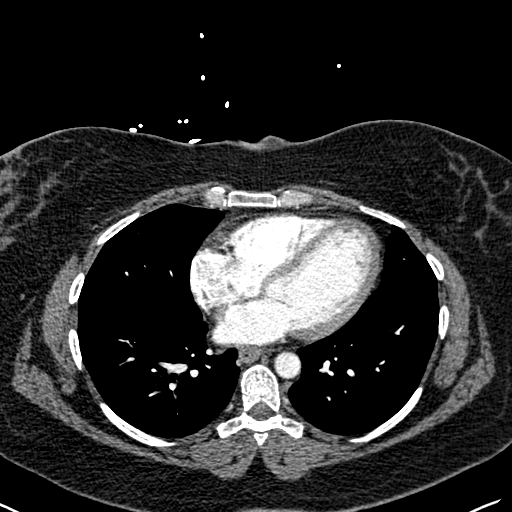
[im 115/256  lung]
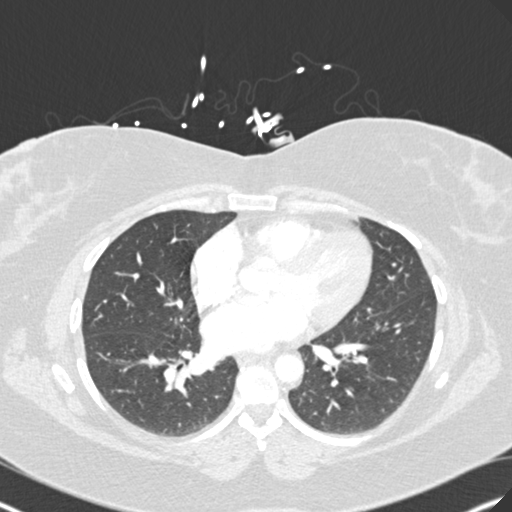
[im 128/256  mediastinal]
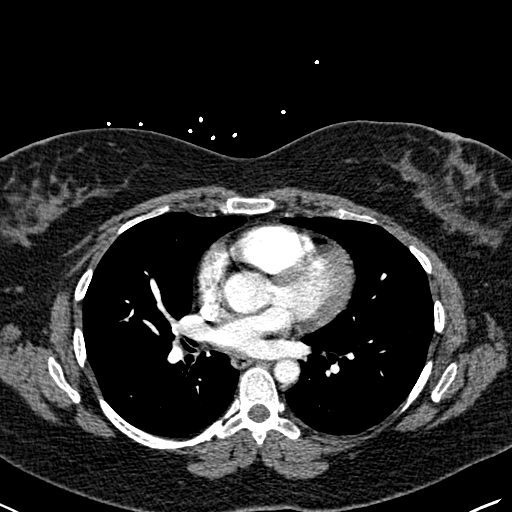
[im 141/256  lung]
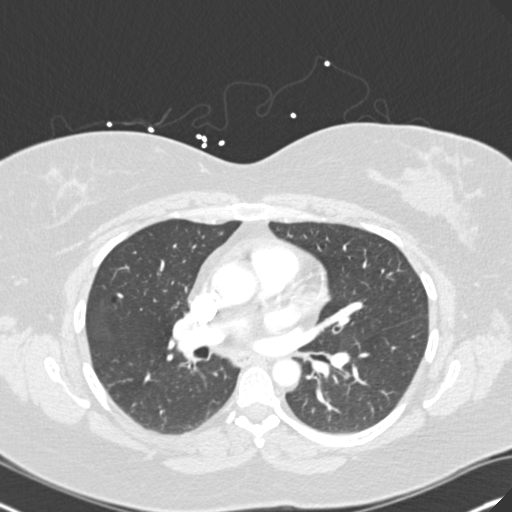
[im 154/256  mediastinal]
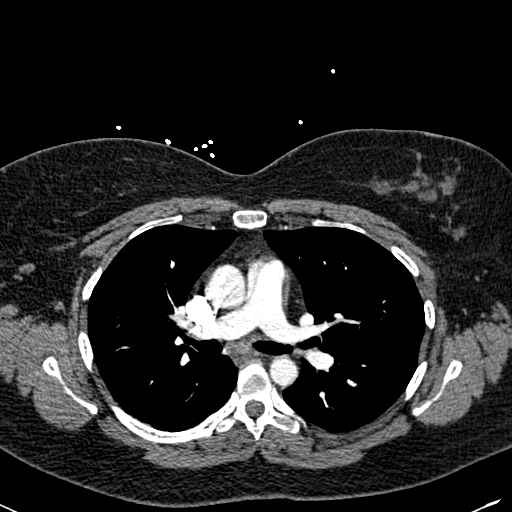
[im 166/256  lung]
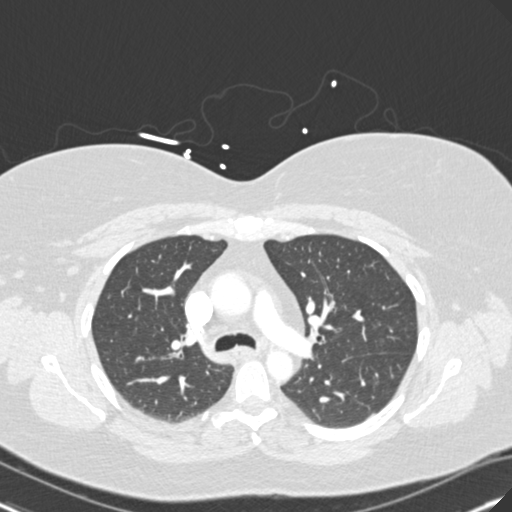
[im 171/256  mediastinal]
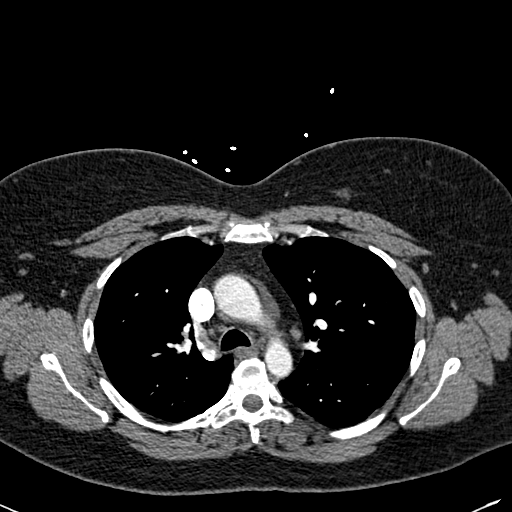
[im 179/256  lung]
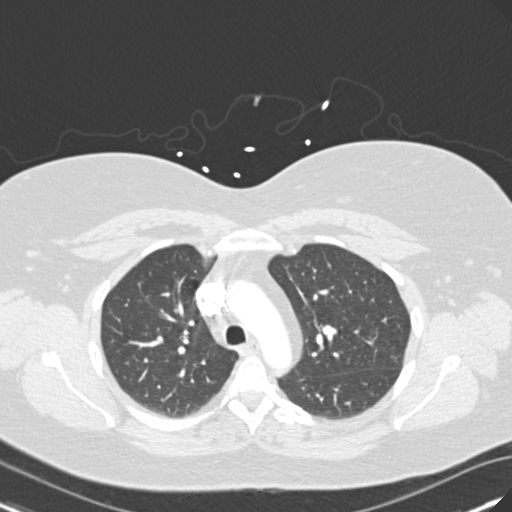
[im 192/256  mediastinal]
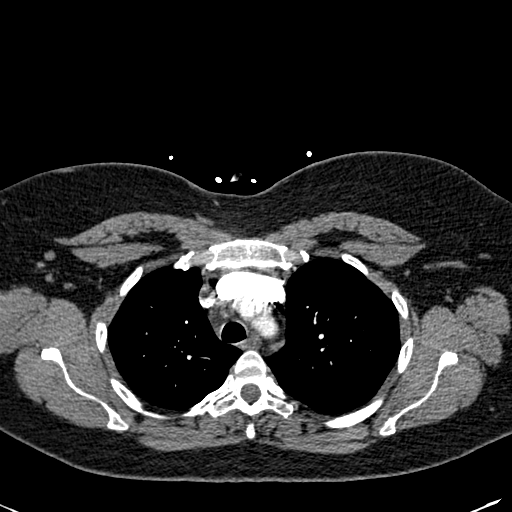
[im 217/256  lung]
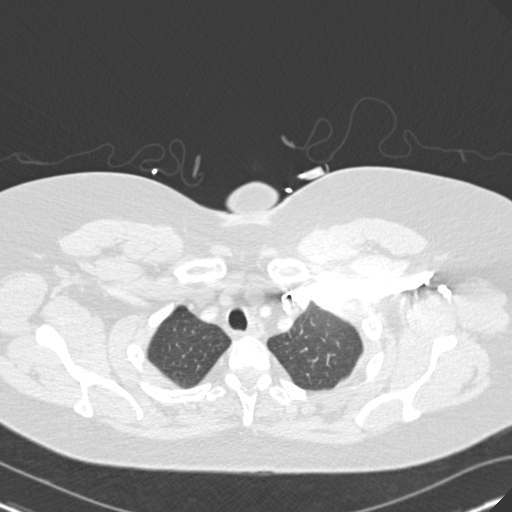
[im 230/256  mediastinal]
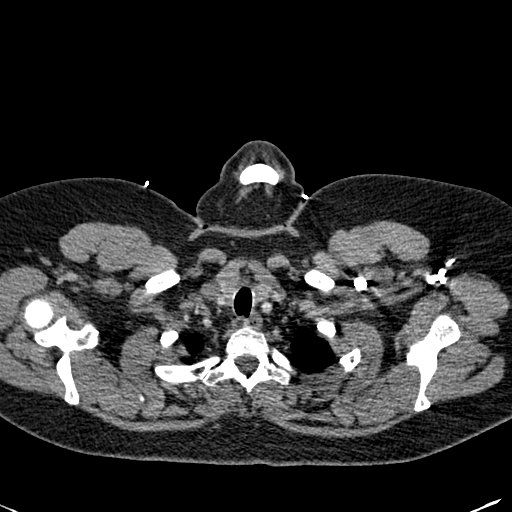
[im 243/256  lung]
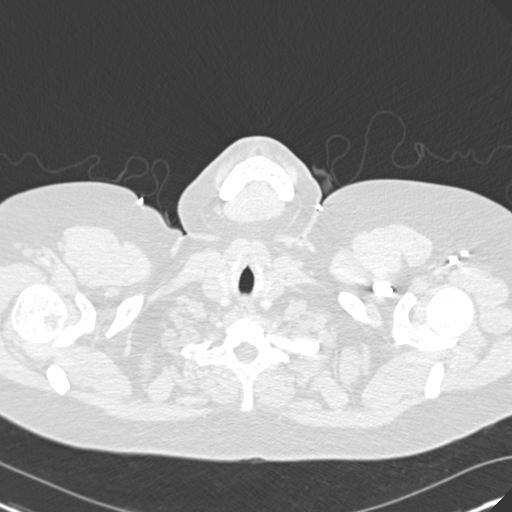

[19 of 32 positions shown; findings below may reference images not displayed]

FINDINGS: There is probable a cyst within left breast measures 2.8 cm.
Clinical correlation is necessary. Further correlation with
mammogram could be performed.

Images of the thoracic inlet are unremarkable. Central airways are
patent. No mediastinal hematoma or adenopathy.

No pulmonary embolus is noted.

Images of the lung parenchyma shows no acute infiltrate or pleural
effusion. No focal consolidation. No pulmonary nodules. There is no
pneumothorax. No pericardial effusion is noted. Heart size within
normal limits. The visualized upper abdomen is unremarkable.

Review of the MIP images confirms the above findings.
IMPRESSION: 1. No pulmonary embolus is noted.
2. No acute infiltrate or pulmonary edema.
3. No mediastinal hematoma or adenopathy. Probable cyst within left
breast measures 2.8 cm. Clinical correlation is necessary. Further
evaluation with mammogram could be performed.

## 2016-10-10 ENCOUNTER — Other Ambulatory Visit: Payer: Self-pay

## 2016-10-10 DIAGNOSIS — D492 Neoplasm of unspecified behavior of bone, soft tissue, and skin: Secondary | ICD-10-CM | POA: Diagnosis not present

## 2016-10-10 DIAGNOSIS — D229 Melanocytic nevi, unspecified: Secondary | ICD-10-CM | POA: Diagnosis not present

## 2016-10-10 DIAGNOSIS — L739 Follicular disorder, unspecified: Secondary | ICD-10-CM | POA: Diagnosis not present

## 2016-10-10 DIAGNOSIS — L821 Other seborrheic keratosis: Secondary | ICD-10-CM | POA: Diagnosis not present

## 2016-11-22 ENCOUNTER — Other Ambulatory Visit: Payer: Self-pay | Admitting: Obstetrics and Gynecology

## 2016-11-22 DIAGNOSIS — Z1231 Encounter for screening mammogram for malignant neoplasm of breast: Secondary | ICD-10-CM

## 2016-11-28 ENCOUNTER — Ambulatory Visit: Payer: 59 | Admitting: Family Medicine

## 2016-12-02 ENCOUNTER — Ambulatory Visit: Payer: 59 | Admitting: Obstetrics and Gynecology

## 2016-12-05 ENCOUNTER — Ambulatory Visit: Payer: 59 | Admitting: Obstetrics and Gynecology

## 2017-01-02 ENCOUNTER — Ambulatory Visit: Payer: 59

## 2017-01-16 ENCOUNTER — Ambulatory Visit: Payer: 59

## 2017-01-18 ENCOUNTER — Other Ambulatory Visit: Payer: Self-pay | Admitting: Family Medicine

## 2017-01-18 ENCOUNTER — Encounter: Payer: Self-pay | Admitting: Family Medicine

## 2017-01-23 ENCOUNTER — Ambulatory Visit
Admission: RE | Admit: 2017-01-23 | Discharge: 2017-01-23 | Disposition: A | Discharge status: Home / Self Care | Payer: 59 | Source: Ambulatory Visit | Attending: Obstetrics and Gynecology | Admitting: Obstetrics and Gynecology

## 2017-01-23 DIAGNOSIS — Z1231 Encounter for screening mammogram for malignant neoplasm of breast: Secondary | ICD-10-CM | POA: Diagnosis not present

## 2017-01-27 NOTE — Progress Notes (Deleted)
46 y.o. G63P3000 Married Caucasian female here for annual exam.    PCP:     Patient's last menstrual period was 09/26/2015 (exact date).           Sexually active: {yes no:314532}  The current method of family planning is status post hysterectomy.    Exercising: {yes no:314532}  {types:19826} Smoker:  {YES [id number]  Health Maintenance: Pap:  08/20/15, negative with neg HR HPV History of abnormal Pap:  {YES NO:22349} MMG:  01/24/17 BIRADS 1 negative/density c Colonoscopy:  n/a BMD:   n/a  Result  n/a TDaP:  11/21/14 Gardasil:   n/a HIV: Hep C: Screening Labs:  Hb today: ***, Urine today: ***   reports that she has quit smoking. Her smoking use included Cigarettes. She has never used smokeless tobacco. She reports that she drinks about 0.6 oz of alcohol per week . She reports that she does not use drugs.  Past Medical History:  Diagnosis Date  . Abnormal mammogram of left breast 01/22/2016  . Anemia 11/21/2014   -with elevated platelets   . Anxiety   . Asthma   . Breast cyst 11/21/2014   -L breast, upper L just off from center -stable per radiology 10/2014   . Generalized anxiety disorder 11/21/2014  . Hypothyroidism   . Menorrhagia 11/21/2014   -seeing gyn    . Obesity 11/21/2014  . Plantar fasciitis of left foot 11/21/2014  . Seasonal allergies 11/21/2014  . Thyroid disease     Past Surgical History:  Procedure Laterality Date  . ABDOMINAL HYSTERECTOMY    . BREAST BIOPSY    . BREAST EXCISIONAL BIOPSY    . BREAST LUMPECTOMY WITH RADIOACTIVE SEED LOCALIZATION Left 01/22/2016   Procedure: LEFT BREAST LUMPECTOMY WITH RADIOACTIVE SEED LOCALIZATION;  Surgeon: Fanny Skates, MD;  Location: [facility];  Service: General;  Laterality: Left;  . CESAREAN SECTION     x3  . CHOLECYSTECTOMY N/A 03/15/2016   Procedure: LAPAROSCOPIC CHOLECYSTECTOMY WITH INTRAOPERATIVE CHOLANGIOGRAM;  Surgeon: Jackolyn Confer, MD;  Location: [facility];  Service: General;  Laterality: N/A;  . CYSTO N/A  10/06/2015   Procedure: Consuela Mimes;  Surgeon: Nunzio Cobbs, MD;  Location: [facility];  Service: Gynecology;  Laterality: N/A;  . TUBAL LIGATION      Current Outpatient Prescriptions  Medication Sig Dispense Refill  . albuterol (PROVENTIL HFA;VENTOLIN HFA) 108 (90 Base) MCG/ACT inhaler Inhale 1 puff into the lungs every 6 (six) hours as needed for wheezing or shortness of breath. 3 Inhaler 0  . fluticasone (FLONASE) 50 MCG/ACT nasal spray Place 2 sprays into both nostrils daily. 16 g 1  . Fluticasone-Salmeterol (ADVAIR) 250-50 MCG/DOSE AEPB Inhale 1 puff into the lungs daily as needed (for shortness of breath). 60 each 3  . levothyroxine (SYNTHROID, LEVOTHROID) 75 MCG tablet Take one tablet daily 5 days a week (Patient taking differently: Take 75 mcg by mouth as directed. Take one tablet daily 5 days a week on Monday, Tuesday, Thursday, Friday, and Saturday.) 60 tablet 3  . levothyroxine (SYNTHROID, LEVOTHROID) 75 MCG tablet TAKE 1 TABLET BY MOUTH DAILY BEFORE BREAKFAST 90 tablet 0  . levothyroxine (SYNTHROID, LEVOTHROID) 88 MCG tablet TAKE ONE TABLET BY MOUTH TWO DAYS A WEEK AS DIRECTED 24 tablet 1  . loratadine (CLARITIN) 10 MG tablet Take 1 tablet (10 mg total) by mouth daily. 90 tablet 0  . venlafaxine XR (EFFEXOR-XR) 150 MG 24 hr capsule Take 1 capsule (150 mg total) by mouth daily with breakfast. 90  capsule 1   No current facility-administered medications for this visit.     Family History  Problem Relation Age of Onset  . Breast cancer Mother 26       s/p lump and rad  . Skin cancer Mother        non-melanoma  . Skin cancer Father        non-melanoma  . Breast cancer Paternal Grandmother        dx 90s; s/p mastectomy  . Cervical cancer Maternal Grandmother   . Arthritis Maternal Grandmother   . CVA Maternal Grandmother   . Stroke Maternal Grandmother 80  . Heart attack Maternal Grandfather        d. 62s  . Colon cancer Paternal Grandfather 59  . Rectal cancer  Maternal Uncle 54  . Aortic aneurysm Maternal Uncle 53  . Uterine cancer Other        maternal great grandmother (MGM's mother)  . Stroke Other   . Cancer Other        maternal great grandfather (MGM's father); NOS cancer  . Breast cancer Other        maternal great grandmother (MGF's mother)  . Colon cancer Other        maternal great grandfather (MGF's father)  . Heart attack Other   . Colon cancer Other        paternal great grandfather (PGF's father)  . Heart attack Other     ROS:  Pertinent items are noted in HPI.  Otherwise, a comprehensive ROS was negative.  Exam:   LMP 09/26/2015 (Exact Date)     General appearance: alert, cooperative and appears stated age Head: Normocephalic, without obvious abnormality, atraumatic Neck: no adenopathy, supple, symmetrical, trachea midline and thyroid normal to inspection and palpation Lungs: clear to auscultation bilaterally Breasts: normal appearance, no masses or tenderness, No nipple retraction or dimpling, No nipple discharge or bleeding, No axillary or supraclavicular adenopathy Heart: regular rate and rhythm Abdomen: soft, non-tender; no masses, no organomegaly Extremities: extremities normal, atraumatic, no cyanosis or edema Skin: Skin color, texture, turgor normal. No rashes or lesions Lymph nodes: Cervical, supraclavicular, and axillary nodes normal. No abnormal inguinal nodes palpated Neurologic: Grossly normal  Pelvic: External genitalia:  no lesions              Urethra:  normal appearing urethra with no masses, tenderness or lesions              Bartholins and Skenes: normal                 Vagina: normal appearing vagina with normal color and discharge, no lesions              Cervix: no lesions              Pap taken: {yes no:314532} Bimanual Exam:  Uterus:  normal size, contour, position, consistency, mobility, non-tender              Adnexa: no mass, fullness, tenderness              Rectal exam: {yes no:314532}.   Confirms.              Anus:  normal sphincter tone, no lesions  Chaperone was present for exam.  Assessment:   Well woman visit with normal exam.   Plan: Mammogram screening discussed. Recommended self breast awareness. Pap and HR HPV as above. Guidelines for Calcium, Vitamin D, regular exercise program including cardiovascular and weight  bearing exercise.   Follow up annually and prn.   Additional counseling given.  {yes [id number]. _______ minutes face to face time of which over 50% was spent in counseling.    After visit summary provided.

## 2017-01-30 ENCOUNTER — Ambulatory Visit: Payer: 59 | Admitting: Obstetrics and Gynecology

## 2017-02-05 DIAGNOSIS — M25572 Pain in left ankle and joints of left foot: Secondary | ICD-10-CM | POA: Diagnosis not present

## 2017-02-05 DIAGNOSIS — M25571 Pain in right ankle and joints of right foot: Secondary | ICD-10-CM | POA: Diagnosis not present

## 2017-02-09 ENCOUNTER — Encounter (HOSPITAL_COMMUNITY): Payer: Self-pay

## 2017-02-10 ENCOUNTER — Encounter: Payer: Self-pay | Admitting: Family Medicine

## 2017-02-10 ENCOUNTER — Other Ambulatory Visit: Payer: Self-pay | Admitting: *Deleted

## 2017-02-10 DIAGNOSIS — E039 Hypothyroidism, unspecified: Secondary | ICD-10-CM

## 2017-02-24 ENCOUNTER — Other Ambulatory Visit: Payer: Self-pay | Admitting: Family Medicine

## 2017-02-27 DIAGNOSIS — M25571 Pain in right ankle and joints of right foot: Secondary | ICD-10-CM | POA: Diagnosis not present

## 2017-02-27 MED FILL — VENLAFAXINE HCL ER 150 MG C: 150 | 90 days supply | Qty: 90 | Fill #0 | Status: TO

## 2017-02-28 ENCOUNTER — Encounter (HOSPITAL_COMMUNITY): Payer: Self-pay

## 2017-03-07 ENCOUNTER — Ambulatory Visit: Payer: 59 | Admitting: Endocrinology

## 2017-03-08 DIAGNOSIS — M79671 Pain in right foot: Secondary | ICD-10-CM | POA: Diagnosis not present

## 2017-03-20 DIAGNOSIS — S92321A Displaced fracture of second metatarsal bone, right foot, initial encounter for closed fracture: Secondary | ICD-10-CM | POA: Diagnosis not present

## 2017-03-29 ENCOUNTER — Encounter: Payer: Self-pay | Admitting: Endocrinology

## 2017-03-29 ENCOUNTER — Ambulatory Visit (INDEPENDENT_AMBULATORY_CARE_PROVIDER_SITE_OTHER): Payer: 59 | Admitting: Endocrinology

## 2017-03-29 ENCOUNTER — Other Ambulatory Visit: Payer: Self-pay | Admitting: Endocrinology

## 2017-03-29 VITALS — BP 128/84 | HR 80 | Ht 59.5 in | Wt 202.4 lb

## 2017-03-29 DIAGNOSIS — E063 Autoimmune thyroiditis: Secondary | ICD-10-CM | POA: Diagnosis not present

## 2017-03-29 DIAGNOSIS — R5383 Other fatigue: Secondary | ICD-10-CM

## 2017-03-29 LAB — CBC WITH DIFFERENTIAL/PLATELET
BASOS PCT: 0.3 % (ref 0.0–3.0)
Basophils Absolute: 0 10*3/uL (ref 0.0–0.1)
EOS ABS: 0 10*3/uL (ref 0.0–0.7)
EOS PCT: 0 % (ref 0.0–5.0)
HEMATOCRIT: 39.4 % (ref 36.0–46.0)
HEMOGLOBIN: 13.2 g/dL (ref 12.0–15.0)
LYMPHS PCT: 19.7 % (ref 12.0–46.0)
Lymphs Abs: 1.7 10*3/uL (ref 0.7–4.0)
MCHC: 33.5 g/dL (ref 30.0–36.0)
MCV: 89.3 fl (ref 78.0–100.0)
MONOS PCT: 7.2 % (ref 3.0–12.0)
Monocytes Absolute: 0.6 10*3/uL (ref 0.1–1.0)
NEUTROS ABS: 6.5 10*3/uL (ref 1.4–7.7)
NEUTROS PCT: 72.8 % (ref 43.0–77.0)
PLATELETS: 436 10*3/uL — AB (ref 150.0–400.0)
RBC: 4.41 Mil/uL (ref 3.87–5.11)
RDW: 13 % (ref 11.5–15.5)
WBC: 8.9 10*3/uL (ref 4.0–10.5)

## 2017-03-29 LAB — TSH: TSH: 2.55 u[IU]/mL (ref 0.35–4.50)

## 2017-03-29 LAB — T4, FREE: Free T4: 0.94 ng/dL (ref 0.60–1.60)

## 2017-03-29 LAB — T3, FREE: T3, Free: 3.2 pg/mL (ref 2.3–4.2)

## 2017-03-29 MED ORDER — LEVOTHYROXINE SODIUM 125 MCG PO TABS: 62.5000 ug | ORAL_TABLET | Freq: Every day | ORAL | 3 refills | Status: DC

## 2017-03-29 MED ORDER — LIOTHYRONINE SODIUM 5 MCG PO TABS: 5.0000 ug | ORAL_TABLET | Freq: Every day | ORAL | 1 refills | Status: DC

## 2017-03-29 NOTE — Progress Notes (Signed)
Patient ID: Maria [name], female   DOB: August 07, 1971, 46 y.o.   MRN: [id number]           Referring Physician: Colin Benton  Reason for Appointment:  Hypothyroidism, new visit  Chief complaint: Tiredness   History of Present Illness:   Hypothyroidism was first diagnosed in 2014  At the time of diagnosis patient was having symptoms of lethargy  , cold sensitivity, difficulty concentrating, weight gain and muscle aches She thinks she had continued symptoms for a couple of years before she was diagnosed to have hypothyroidism in [place] However no records available of her original workup She was started on levothyroxine supplements and she felt significantly better with this but not back to normal   She had more improvement in her symptoms of brain fog and energy level as well as muscle aches.          The patient has been treated with mostly 75 g of levothyroxine In 2017 she was given additional doses of 88 g to take twice a week and 75 g was given the other 5 days of the week With this she thinks she felt a little better  However in the last few months she has felt worse and her symptoms are similar to when she was first diagnosed  She is also concerned about her weight gain and she thinks she has gained about 60 pounds over the last few years Appears to have gained about 18 pounds since 2017 She believes she is eating healthy  She says he was previously doing exercise with hiking but this did not help her weight Currently she does not feel any energy to do any exercise  She has not had any labs done since last October         Patient's weight history is as follows:  Wt Readings from Last 3 Encounters:  03/29/17 202 lb 6.4 oz (91.8 kg)  06/09/16 185 lb 4.8 oz (84.1 kg)  03/24/16 184 lb 11.2 oz (83.8 kg)    Thyroid function results have been as follows:  Lab Results  Component Value Date   TSH 2.26 06/09/2016   TSH 1.05 02/22/2016   TSH 0.80 10/14/2015   TSH 3.55  08/20/2015     Past Medical History:  Diagnosis Date  . Abnormal mammogram of left breast 01/22/2016  . Anemia 11/21/2014   -with elevated platelets   . Anxiety   . Asthma   . Breast cyst 11/21/2014   -L breast, upper L just off from center -stable per radiology 10/2014   . Generalized anxiety disorder 11/21/2014  . Hypothyroidism   . Menorrhagia 11/21/2014   -seeing gyn    . Obesity 11/21/2014  . Plantar fasciitis of left foot 11/21/2014  . Seasonal allergies 11/21/2014  . Thyroid disease     Past Surgical History:  Procedure Laterality Date  . ABDOMINAL HYSTERECTOMY    . BREAST BIOPSY    . BREAST EXCISIONAL BIOPSY    . BREAST LUMPECTOMY WITH RADIOACTIVE SEED LOCALIZATION Left 01/22/2016   Procedure: LEFT BREAST LUMPECTOMY WITH RADIOACTIVE SEED LOCALIZATION;  Surgeon: Fanny Skates, MD;  Location: [facility];  Service: General;  Laterality: Left;  . CESAREAN SECTION     x3  . CHOLECYSTECTOMY N/A 03/15/2016   Procedure: LAPAROSCOPIC CHOLECYSTECTOMY WITH INTRAOPERATIVE CHOLANGIOGRAM;  Surgeon: Jackolyn Confer, MD;  Location: [facility];  Service: General;  Laterality: N/A;  . CYSTO N/A 10/06/2015   Procedure: Consuela Mimes;  Surgeon: Nunzio Cobbs,  MD;  Location: [facility];  Service: Gynecology;  Laterality: N/A;  . TUBAL LIGATION      Family History  Problem Relation Age of Onset  . Breast cancer Mother 20       s/p lump and rad  . Skin cancer Mother        non-melanoma  . Skin cancer Father        non-melanoma  . Breast cancer Paternal Grandmother        dx 52s; s/p mastectomy  . Cervical cancer Maternal Grandmother   . Arthritis Maternal Grandmother   . CVA Maternal Grandmother   . Stroke Maternal Grandmother 80  . Heart attack Maternal Grandfather        d. 14s  . Colon cancer Paternal Grandfather 84  . Rectal cancer Maternal Uncle 54  . Aortic aneurysm Maternal Uncle 53  . Uterine cancer Other        maternal great grandmother (MGM's mother)  . Stroke Other    . Cancer Other        maternal great grandfather (MGM's father); NOS cancer  . Breast cancer Other        maternal great grandmother (MGF's mother)  . Colon cancer Other        maternal great grandfather (MGF's father)  . Heart attack Other   . Colon cancer Other        paternal great grandfather (PGF's father)  . Heart attack Other   . Thyroid disease Neg Hx     Social History:  reports that she has quit smoking. Her smoking use included Cigarettes. She has never used smokeless tobacco. She reports that she drinks about 0.6 oz of alcohol per week . She reports that she does not use drugs.  Allergies: No Known Allergies  Allergies as of 03/29/2017   No Known Allergies     Medication List       Accurate as of 03/29/17  4:36 PM. Always use your most recent med list.          albuterol 108 (90 Base) MCG/ACT inhaler Commonly known as:  PROVENTIL HFA;VENTOLIN HFA Inhale 1 puff into the lungs every 6 (six) hours as needed for wheezing or shortness of breath.   fluticasone 50 MCG/ACT nasal spray Commonly known as:  FLONASE Place 2 sprays into both nostrils daily.   Fluticasone-Salmeterol 250-50 MCG/DOSE Aepb Commonly known as:  ADVAIR Inhale 1 puff into the lungs daily as needed (for shortness of breath).   levothyroxine 75 MCG tablet Commonly known as:  SYNTHROID, LEVOTHROID Take one tablet daily 5 days a week   levothyroxine 88 MCG tablet Commonly known as:  SYNTHROID, LEVOTHROID TAKE ONE TABLET BY MOUTH TWO DAYS A WEEK AS DIRECTED   loratadine 10 MG tablet Commonly known as:  CLARITIN Take 1 tablet (10 mg total) by mouth daily.   venlafaxine XR 150 MG 24 hr capsule Commonly known as:  EFFEXOR-XR TAKE 1 CAPSULE BY MOUTH DAILY WITH BREAKFAST.          Review of Systems  Constitutional: Positive for weight gain and malaise.  HENT: Negative for trouble swallowing.   Respiratory: Negative for shortness of breath.   Cardiovascular: Positive for leg swelling.        Occasionally may have ankle swelling swelling of her fingers  Gastrointestinal: Positive for constipation and diarrhea.  Endocrine: Positive for fatigue and cold intolerance.  Musculoskeletal: Negative for muscle aches.  Skin: Negative for dry skin.  No hair loss  Neurological: Negative for weakness.  Psychiatric/Behavioral: Positive for nervousness and insomnia.Negative for depressed mood.                Examination:    BP 128/84   Pulse 80   Ht 4' 11.5" (1.511 m)   Wt 202 lb 6.4 oz (91.8 kg)   LMP 09/26/2015 (Exact Date)   SpO2 98%   BMI 40.20 kg/m   GENERAL:  Average build.  Has generalized obesity, no cushingoid features  No pallor, clubbing, lymphadenopathy or edema.     Skin:  no rash or pigmentation.  No purple striae  EYES:  No prominence of the eyes or swelling of the eyelids  ENT: Oral mucosa and tongue normal.  THYROID:  Not palpable.  HEART:  Normal  S1 and S2; no murmur or click.  CHEST:    Lungs: Vescicular breath sounds heard equally.  No crepitations/ wheeze.  ABDOMEN:  No distention.  Liver and spleen not palpable.  No other mass or tenderness.  NEUROLOGICAL: Reflexes are bilaterally normal at biceps and ankles.  JOINTS:  Normal.   Assessment:  HYPOTHYROIDISM, Primary and on levothyroxine supplementation since about 2014 Although initially she had subjective improvement in her multiple symptoms she says she is symptomatic again She is complaining of both fatigue and difficulty with clear thinking as well as significant weight gain Has not had any thyroid levels done recently  Unclear whether her fatigue and weight gain is related to hypothyroidism Currently not exercising although she thinks she is eating healthy Exam does not show any features of Cushing's disease  PLAN:   Check thyroid levels including free T3 and free T4 Discussed that if she has a relatively low free T3 she may benefit from using a combination of Cytomel and  levothyroxine instead of levothyroxine alone  For her weight gain she may benefit from nutrition consultation, weight loss medications and restarting exercise Follow-up in about 6 weeks   Maria [name] 03/29/2017, 4:36 PM   Consultation note copy sent to the PCP  Note: This office note was prepared with Dragon voice recognition system technology. Any transcriptional errors that result from this process are unintentional.

## 2017-03-31 ENCOUNTER — Encounter: Payer: Self-pay | Admitting: Endocrinology

## 2017-05-04 ENCOUNTER — Other Ambulatory Visit (INDEPENDENT_AMBULATORY_CARE_PROVIDER_SITE_OTHER): Payer: 59

## 2017-05-04 ENCOUNTER — Encounter: Payer: Self-pay | Admitting: Family Medicine

## 2017-05-04 DIAGNOSIS — E063 Autoimmune thyroiditis: Secondary | ICD-10-CM

## 2017-05-04 LAB — T4, FREE: FREE T4: 0.73 ng/dL (ref 0.60–1.60)

## 2017-05-04 LAB — TSH: TSH: 3.36 u[IU]/mL (ref 0.35–4.50)

## 2017-05-04 LAB — T3, FREE: T3 FREE: 2.7 pg/mL (ref 2.3–4.2)

## 2017-05-05 ENCOUNTER — Ambulatory Visit (INDEPENDENT_AMBULATORY_CARE_PROVIDER_SITE_OTHER): Payer: 59 | Admitting: Endocrinology

## 2017-05-05 ENCOUNTER — Encounter: Payer: Self-pay | Admitting: Endocrinology

## 2017-05-05 VITALS — BP 104/76 | HR 94 | Temp 98.5°F | Resp 16 | Ht 59.5 in | Wt 206.4 lb

## 2017-05-05 DIAGNOSIS — E063 Autoimmune thyroiditis: Secondary | ICD-10-CM

## 2017-05-05 MED ORDER — LIOTHYRONINE SODIUM 5 MCG PO TABS: ORAL_TABLET | ORAL | 1 refills | Status: DC

## 2017-05-05 MED ORDER — LIOTHYRONINE SODIUM 5 MCG PO TABS: 5.0000 ug | ORAL_TABLET | Freq: Every day | ORAL | 1 refills | Status: DC

## 2017-05-05 NOTE — Patient Instructions (Signed)
Take 2 of the Cytomel in am daily

## 2017-05-05 NOTE — Progress Notes (Signed)
Patient ID: Maria [name], female   DOB: 12-02-1970, 46 y.o.   MRN: [id number]           Referring Physician: Colin Benton  Reason for Appointment:  Hypothyroidism, follow-up visit  Chief complaint: Tiredness, low thyroid   History of Present Illness:   Hypothyroidism was first diagnosed in 2014  At the time of diagnosis patient was having symptoms of lethargy  , cold sensitivity, difficulty concentrating, weight gain and muscle aches She thinks she had continued symptoms for a couple of years before she was diagnosed to have hypothyroidism in [place] However no records available of her original workup She was started on levothyroxine supplements and she felt significantly better with this but not back to normal   She had more improvement in her symptoms of brain fog and energy level as well as muscle aches.          The patient has been treated with mostly 75 g of levothyroxine In 2017 she was given additional doses of 88 g to take twice a week and 75 g was given the other 5 days of the week  RECENT history: She was seen in consultation in 8/18 because of complaining of lethargy and other symptoms as above is prior to normal TSH of 2.5 Also is concerned about her weight gain Although she had a reasonably good level of T3 of 3.2 she has been given a trial of levothyroxine 62.5 and Cytomel 5 g daily as a trial         She now says that she is not feeling any different with the new regimen and still has feelings of fatigue and is gaining weight along with some muscle aches and feeling of weakness She is also having significant insomnia  Her free T3 level is now 2.7 and TSH is slightly higher than before at 3.4  Patient's weight history is as follows:  Wt Readings from Last 3 Encounters:  05/05/17 206 lb 6.4 oz (93.6 kg)  03/29/17 202 lb 6.4 oz (91.8 kg)  06/09/16 185 lb 4.8 oz (84.1 kg)    Thyroid function results have been as follows:  Lab Results  Component Value  Date   TSH 3.36 05/04/2017   TSH 2.55 03/29/2017   TSH 2.26 06/09/2016   TSH 1.05 02/22/2016   FREET4 0.73 05/04/2017   FREET4 0.94 03/29/2017   T3FREE 2.7 05/04/2017   T3FREE 3.2 03/29/2017     Past Medical History:  Diagnosis Date  . Abnormal mammogram of left breast 01/22/2016  . Anemia 11/21/2014   -with elevated platelets   . Anxiety   . Asthma   . Breast cyst 11/21/2014   -L breast, upper L just off from center -stable per radiology 10/2014   . Generalized anxiety disorder 11/21/2014  . Hypothyroidism   . Menorrhagia 11/21/2014   -seeing gyn    . Obesity 11/21/2014  . Plantar fasciitis of left foot 11/21/2014  . Seasonal allergies 11/21/2014  . Thyroid disease     Past Surgical History:  Procedure Laterality Date  . ABDOMINAL HYSTERECTOMY    . BREAST BIOPSY    . BREAST EXCISIONAL BIOPSY    . BREAST LUMPECTOMY WITH RADIOACTIVE SEED LOCALIZATION Left 01/22/2016   Procedure: LEFT BREAST LUMPECTOMY WITH RADIOACTIVE SEED LOCALIZATION;  Surgeon: Fanny Skates, MD;  Location: [facility];  Service: General;  Laterality: Left;  . CESAREAN SECTION     x3  . CHOLECYSTECTOMY N/A 03/15/2016   Procedure: LAPAROSCOPIC CHOLECYSTECTOMY WITH  INTRAOPERATIVE CHOLANGIOGRAM;  Surgeon: Jackolyn Confer, MD;  Location: [facility];  Service: General;  Laterality: N/A;  . CYSTO N/A 10/06/2015   Procedure: Consuela Mimes;  Surgeon: Nunzio Cobbs, MD;  Location: [facility];  Service: Gynecology;  Laterality: N/A;  . TUBAL LIGATION      Family History  Problem Relation Age of Onset  . Breast cancer Mother 36       s/p lump and rad  . Skin cancer Mother        non-melanoma  . Skin cancer Father        non-melanoma  . Breast cancer Paternal Grandmother        dx 64s; s/p mastectomy  . Cervical cancer Maternal Grandmother   . Arthritis Maternal Grandmother   . CVA Maternal Grandmother   . Stroke Maternal Grandmother 80  . Heart attack Maternal Grandfather        d. 34s  . Colon cancer  Paternal Grandfather 55  . Rectal cancer Maternal Uncle 54  . Aortic aneurysm Maternal Uncle 53  . Uterine cancer Other        maternal great grandmother (MGM's mother)  . Stroke Other   . Cancer Other        maternal great grandfather (MGM's father); NOS cancer  . Breast cancer Other        maternal great grandmother (MGF's mother)  . Colon cancer Other        maternal great grandfather (MGF's father)  . Heart attack Other   . Colon cancer Other        paternal great grandfather (PGF's father)  . Heart attack Other   . Thyroid disease Neg Hx     Social History:  reports that she has quit smoking. Her smoking use included Cigarettes. She has never used smokeless tobacco. She reports that she drinks about 0.6 oz of alcohol per week . She reports that she does not use drugs.  Allergies: No Known Allergies  Allergies as of 05/05/2017   No Known Allergies     Medication List       Accurate as of 05/05/17  4:58 PM. Always use your most recent med list.          albuterol 108 (90 Base) MCG/ACT inhaler Commonly known as:  PROVENTIL HFA;VENTOLIN HFA Inhale 1 puff into the lungs every 6 (six) hours as needed for wheezing or shortness of breath.   fluticasone 50 MCG/ACT nasal spray Commonly known as:  FLONASE Place 2 sprays into both nostrils daily.   Fluticasone-Salmeterol 250-50 MCG/DOSE Aepb Commonly known as:  ADVAIR Inhale 1 puff into the lungs daily as needed (for shortness of breath).   levothyroxine 125 MCG tablet Commonly known as:  SYNTHROID, LEVOTHROID Take 0.5 tablets (62.5 mcg total) by mouth daily.   liothyronine 5 MCG tablet Commonly known as:  CYTOMEL Take 1 tablet (5 mcg total) by mouth daily. Take 2 tabs with levothyroxine in the morning   loratadine 10 MG tablet Commonly known as:  CLARITIN Take 1 tablet (10 mg total) by mouth daily.   venlafaxine XR 150 MG 24 hr capsule Commonly known as:  EFFEXOR-XR TAKE 1 CAPSULE BY MOUTH DAILY WITH BREAKFAST.              Discharge Care Instructions        Start     Ordered   05/05/17 0000  liothyronine (CYTOMEL) 5 MCG tablet  Daily     05/05/17 1325  05/05/17 0000  TSH     05/05/17 1325   05/05/17 0000  T4, free     05/05/17 1325   05/05/17 0000  T3, free     05/05/17 1325         Review of Systems                Examination:    BP 104/76 (BP Location: Right Arm, Patient Position: Sitting, Cuff Size: Large)   Pulse 94   Temp 98.5 F (36.9 C) (Oral)   Resp 16   Ht 4' 11.5" (1.511 m)   Wt 206 lb 6.4 oz (93.6 kg)   LMP 09/26/2015 (Exact Date)   SpO2 95%   BMI 40.99 kg/m   Exam not indicated  Assessment:  HYPOTHYROIDISM, Primary and diagnosed in 2014  Unclear whether her fatigue and weight gain is related to hypothyroidism since her TSH has been normal Also her free T3 level was not relatively lower on the previous assessment With low-dose Cytomel and reducing her levothyroxine she does not feel any better although her TSH is minimally higher at 3.5  She is also having significant problems with insomnia which may affect her functional status Her weight gain may be related to her inactivity and not exercising because of ankle problems  PLAN:   Will give her a trial of 10 g of liothyronine instead of 5 She will continue the same doses of levothyroxine Follow-up will include her T3 levels also in addition to TSH  For her weight gain she may benefit from starting exercise again and she will go to her orthopedic doctor to have the ankle problem resolved Offered nutrition consultation which she does not want to do this as she thinks she is eating healthy  She will make an appointment with her PCP to discuss her insomnia issues although she denies any depression  Follow-up in about 6 weeks   Mariyah Upshaw 05/05/2017, 4:58 PM    Note: This office note was prepared with Estate agent. Any transcriptional errors that result from this  process are unintentional.

## 2017-05-08 ENCOUNTER — Ambulatory Visit: Payer: 59 | Admitting: Endocrinology

## 2017-05-08 ENCOUNTER — Telehealth: Payer: Self-pay | Admitting: Endocrinology

## 2017-05-08 ENCOUNTER — Other Ambulatory Visit: Payer: Self-pay

## 2017-05-08 MED ORDER — LIOTHYRONINE SODIUM 5 MCG PO TABS: ORAL_TABLET | ORAL | 1 refills | Status: DC

## 2017-05-08 MED ORDER — LEVOTHYROXINE SODIUM 125 MCG PO TABS: 62.5000 ug | ORAL_TABLET | Freq: Every day | ORAL | 3 refills | Status: DC

## 2017-05-08 NOTE — Telephone Encounter (Signed)
Called patient and left message to let her know that I have sent both of these prescriptions to the [facility] on [address].

## 2017-05-08 NOTE — Telephone Encounter (Signed)
Refill of Cytomel and [facility]othyroxine [facility] [address]

## 2017-06-15 ENCOUNTER — Other Ambulatory Visit (INDEPENDENT_AMBULATORY_CARE_PROVIDER_SITE_OTHER): Payer: 59

## 2017-06-15 DIAGNOSIS — E063 Autoimmune thyroiditis: Secondary | ICD-10-CM

## 2017-06-15 LAB — T4, FREE: FREE T4: 0.67 ng/dL (ref 0.60–1.60)

## 2017-06-15 LAB — T3, FREE: T3, Free: 3.5 pg/mL (ref 2.3–4.2)

## 2017-06-15 LAB — TSH: TSH: 4.3 u[IU]/mL (ref 0.35–4.50)

## 2017-06-19 ENCOUNTER — Ambulatory Visit: Payer: 59 | Admitting: Endocrinology

## 2017-06-19 ENCOUNTER — Telehealth: Payer: Self-pay | Admitting: Endocrinology

## 2017-06-19 ENCOUNTER — Other Ambulatory Visit: Payer: Self-pay

## 2017-06-19 MED ORDER — LIOTHYRONINE SODIUM 5 MCG PO TABS: ORAL_TABLET | ORAL | 0 refills | Status: DC

## 2017-06-19 MED ORDER — LEVOTHYROXINE SODIUM 125 MCG PO TABS: 62.5000 ug | ORAL_TABLET | Freq: Every day | ORAL | 0 refills | Status: DC

## 2017-06-19 NOTE — Telephone Encounter (Signed)
Patient needs all of her prescriptions (90 day supply on each) called into [facility] ph# [phone]. She is completely out of medication

## 2017-06-19 NOTE — Telephone Encounter (Signed)
Called patient and left a voice message to let her know that I have sent over the Levothyroxine and the Liothyronine to the [facility] in [place] for her and they are both 90 day prescriptions.

## 2017-07-04 ENCOUNTER — Encounter: Payer: Self-pay | Admitting: Endocrinology

## 2017-08-24 ENCOUNTER — Encounter: Payer: Self-pay | Admitting: Family Medicine

## 2017-08-28 ENCOUNTER — Other Ambulatory Visit: Payer: Self-pay | Admitting: Endocrinology

## 2017-08-28 DIAGNOSIS — E039 Hypothyroidism, unspecified: Secondary | ICD-10-CM

## 2017-08-30 ENCOUNTER — Other Ambulatory Visit: Payer: 59

## 2017-09-04 ENCOUNTER — Ambulatory Visit: Payer: 59 | Admitting: Endocrinology

## 2017-09-07 ENCOUNTER — Other Ambulatory Visit: Payer: Self-pay | Admitting: Family Medicine

## 2017-09-08 ENCOUNTER — Other Ambulatory Visit: Payer: Self-pay | Admitting: Family Medicine

## 2017-09-08 ENCOUNTER — Other Ambulatory Visit: Payer: Self-pay | Admitting: Endocrinology

## 2017-09-12 ENCOUNTER — Ambulatory Visit (INDEPENDENT_AMBULATORY_CARE_PROVIDER_SITE_OTHER): Payer: No Typology Code available for payment source | Admitting: Family Medicine

## 2017-09-12 ENCOUNTER — Encounter: Payer: Self-pay | Admitting: Family Medicine

## 2017-09-12 ENCOUNTER — Ambulatory Visit: Payer: Self-pay | Admitting: Family Medicine

## 2017-09-12 VITALS — BP 120/68 | HR 107 | Temp 98.0°F | Resp 12 | Ht 59.5 in

## 2017-09-12 DIAGNOSIS — J069 Acute upper respiratory infection, unspecified: Secondary | ICD-10-CM | POA: Diagnosis not present

## 2017-09-12 DIAGNOSIS — J45901 Unspecified asthma with (acute) exacerbation: Secondary | ICD-10-CM | POA: Diagnosis not present

## 2017-09-12 LAB — POCT INFLUENZA A/B
INFLUENZA A, POC: NEGATIVE
Influenza B, POC: NEGATIVE

## 2017-09-12 MED ORDER — BENZONATATE 100 MG PO CAPS: 200.0000 mg | ORAL_CAPSULE | Freq: Two times a day (BID) | ORAL | 0 refills | Status: AC | PRN

## 2017-09-12 MED ORDER — ALBUTEROL SULFATE (2.5 MG/3ML) 0.083% IN NEBU
2.5000 mg | INHALATION_SOLUTION | Freq: Once | RESPIRATORY_TRACT | Status: AC
  Administered 2017-09-12: 2.5 mg via RESPIRATORY_TRACT

## 2017-09-12 MED ORDER — PREDNISONE 20 MG PO TABS: 40.0000 mg | ORAL_TABLET | Freq: Every day | ORAL | 0 refills | Status: AC

## 2017-09-12 MED ORDER — HYDROCODONE-HOMATROPINE 5-1.5 MG/5ML PO SYRP: 5.0000 mL | ORAL_SOLUTION | Freq: Every evening | ORAL | 0 refills | Status: AC | PRN

## 2017-09-12 NOTE — Patient Instructions (Addendum)
  [name] I have seen you today for an acute visit.  A few things to remember from today's visit:   Mild asthma with exacerbation, unspecified whether persistent - Plan: albuterol (PROVENTIL) (2.5 MG/3ML) 0.083% nebulizer solution 2.5 mg, predniSONE (DELTASONE) 20 MG tablet  URI, acute - Plan: POCT Influenza A/B, albuterol (PROVENTIL) (2.5 MG/3ML) 0.083% nebulizer solution 2.5 mg, benzonatate (TESSALON) 100 MG capsule, HYDROcodone-homatropine (HYCODAN) 5-1.5 MG/5ML syrup  Generalized body aches - Plan: POCT Influenza A/B   Medications prescribed today are intended for short period of time and will not be refill upon request, a follow up appointment might be necessary to discuss continuation of of treatment if appropriate.  Prednisone with food. Benzonatate during the day and Hycodan at night.  Albuterol inh 2 puff every 4- hours for a week then as needed for wheezing or shortness of breath.  I do not think imaging is necessary today.  viral infections are self-limited and we treat each symptom depending of severity.  Over the counter medications as decongestants and cold medications usually help, they need to be taken with caution if there is a history of high blood pressure or palpitations. Tylenol and/or Ibuprofen also helps with most symptoms (headache, muscle aching, fever,etc) Plenty of fluids. Honey helps with cough. Steam inhalations helps with runny nose, nasal congestion, and may prevent sinus infections. Cough and nasal congestion could last a few days and sometimes weeks. Please follow in not any better in 1-2 weeks or if symptoms get worse.   In general please monitor for signs of worsening symptoms and seek immediate medical attention if any concerning.    I hope you get better soon!

## 2017-09-12 NOTE — Progress Notes (Signed)
ACUTE VISIT  HPI:  Chief Complaint  Patient presents with  . Cough    non productive sx started a 2 days ago     [name] is a 47 y.o.female here today complaining of 2 days of respiratory symptoms. She is positive she has bronchitis. She has history of asthma, she has had intermittent wheezing, dyspnea, and nonproductive cough. She just used her Albuterol inhaler a few minutes ago. Symptoms are exacerbated by minimal exertion and alleviated by rest. Chest tightness of deep breathing. She denies associated diaphoresis or palpitation She is not sure about fever but has had some chills.   URI   This is a new problem. The current episode started in the past 7 days. The problem has been gradually worsening. Associated symptoms include congestion, coughing, rhinorrhea and wheezing. Pertinent negatives include no abdominal pain, diarrhea, ear pain, headaches, nausea, neck pain, plugged ear sensation, rash, sinus pain, sneezing, swollen glands or vomiting. She has tried nothing for the symptoms.    "Scratchy throat"  upper body body aches.   No Hx of recent travel. + Sick contact, resume was sick last week with URI symptoms. No known insect bite.  Hx of allergies: Yes, allergic rhinitis and asthma. She is on Advair 250-50 mcg bid, Flonase nasal spray,and Claritin 10 mg daily. Former smoker.   OTC medications for this problem: None  Symptoms otherwise stable.   Review of Systems  Constitutional: Positive for activity change, chills and fatigue. Negative for appetite change.  HENT: Positive for congestion, postnasal drip and rhinorrhea. Negative for ear pain, mouth sores, sinus pain, sneezing, trouble swallowing and voice change.   Eyes: Negative for discharge, redness and itching.  Respiratory: Positive for cough, shortness of breath and wheezing.   Cardiovascular: Negative for leg swelling.  Gastrointestinal: Negative for abdominal pain, diarrhea, nausea  and vomiting.  Musculoskeletal: Positive for myalgias. Negative for gait problem and neck pain.  Skin: Negative for rash.  Allergic/Immunologic: Positive for environmental allergies.  Neurological: Negative for syncope, weakness and headaches.  Hematological: Negative for adenopathy. Does not bruise/bleed easily.  Psychiatric/Behavioral: Negative for confusion. The patient is nervous/anxious.       Current Outpatient Medications on File Prior to Visit  Medication Sig Dispense Refill  . albuterol (PROVENTIL HFA;VENTOLIN HFA) 108 (90 Base) MCG/ACT inhaler Inhale 1 puff into the lungs every 6 (six) hours as needed for wheezing or shortness of breath. 3 Inhaler 0  . fluticasone (FLONASE) 50 MCG/ACT nasal spray Place 2 sprays into both nostrils daily. 16 g 1  . Fluticasone-Salmeterol (ADVAIR) 250-50 MCG/DOSE AEPB Inhale 1 puff into the lungs daily as needed (for shortness of breath). 60 each 3  . levothyroxine (SYNTHROID, LEVOTHROID) 125 MCG tablet TAKE 0.5 TABLETS (62.5 MCG TOTAL) DAILY BY MOUTH. 45 tablet 0  . liothyronine (CYTOMEL) 5 MCG tablet Take 2 tabs with levothyroxine in the morning 180 tablet 0  . loratadine (CLARITIN) 10 MG tablet Take 1 tablet (10 mg total) by mouth daily. 90 tablet 0  . venlafaxine XR (EFFEXOR-XR) 150 MG 24 hr capsule TAKE 1 CAPSULE BY MOUTH DAILY WITH BREAKFAST. 30 capsule 0   No current facility-administered medications on file prior to visit.      Past Medical History:  Diagnosis Date  . Abnormal mammogram of left breast 01/22/2016  . Anemia 11/21/2014   -with elevated platelets   . Anxiety   . Asthma   . Breast cyst 11/21/2014   -L breast, upper L  just off from center -stable per radiology 10/2014   . Generalized anxiety disorder 11/21/2014  . Hypothyroidism   . Menorrhagia 11/21/2014   -seeing gyn    . Obesity 11/21/2014  . Plantar fasciitis of left foot 11/21/2014  . Seasonal allergies 11/21/2014  . Thyroid disease    No Known Allergies  Social History    Socioeconomic History  . Marital status: Married    Spouse name: None  . Number of children: None  . Years of education: None  . Highest education level: None  Social Needs  . Financial resource strain: None  . Food insecurity - worry: None  . Food insecurity - inability: None  . Transportation needs - medical: None  . Transportation needs - non-medical: None  Occupational History  . None  Tobacco Use  . Smoking status: Former Smoker    Types: Cigarettes  . Smokeless tobacco: Never Used  Substance and Sexual Activity  . Alcohol use: Yes    Alcohol/week: 0.6 oz    Types: 1 Glasses of wine per week    Comment: socially  . Drug use: No  . Sexual activity: Yes    Partners: Male    Birth control/protection: Surgical    Comment: Tubal  Other Topics Concern  . None  Social History Narrative  . None    Vitals:   09/12/17 1115  BP: 120/68  Pulse: (!) 107  Resp: 12  Temp: 98 F (36.7 C)  SpO2: 97%   Body mass index is 40.99 kg/m.   Physical Exam  Nursing note and vitals reviewed. Constitutional: She is oriented to person, place, and time. She appears well-developed. She does not appear ill. No distress.  HENT:  Head: Normocephalic and atraumatic.  Right Ear: Tympanic membrane, external ear and ear canal normal.  Left Ear: Tympanic membrane, external ear and ear canal normal.  Nose: Rhinorrhea present. Right sinus exhibits no maxillary sinus tenderness and no frontal sinus tenderness. Left sinus exhibits no maxillary sinus tenderness and no frontal sinus tenderness.  Mouth/Throat: Oropharynx is clear and moist and mucous membranes are normal.  Eyes: Conjunctivae are normal.  Neck: No muscular tenderness present. No edema and no erythema present.  Cardiovascular: Normal rate and regular rhythm.  No murmur heard. Respiratory: Effort normal. No stridor. No respiratory distress. She has wheezes. She has no rales.  Lymphadenopathy:       Head (right side): No  submandibular adenopathy present.       Head (left side): No submandibular adenopathy present.    She has no cervical adenopathy.  Neurological: She is alert and oriented to person, place, and time. She has normal strength. Gait normal.  Skin: Skin is warm. No rash noted. No erythema.  Psychiatric: Her speech is normal. Her mood appears anxious. Her affect is blunt.  Well groomed, good eye contact.    ASSESSMENT AND PLAN:  Ms. Maria [name] was seen today for cough.  Diagnoses and all orders for this visit:  Mild asthma with exacerbation, unspecified whether persistent  Here in the office and after verbal consent, she received breathing treatment with Albuterol, which she tolerated well. Lung auscultation after nebulization treatment: Wheezing resolved, no rales or rhonchi. I do not think imaging is needed at this time.  Short course of Prednisone recommended, she has taken in the past and has tolerated it well. Albuterol inh 2 puff every 4-6 hours for a week then as needed for wheezing or shortness of breath.  Clearly instructed about warning signs.  Follow-up in a week if needed.  -     albuterol (PROVENTIL) (2.5 MG/3ML) 0.083% nebulizer solution 2.5 mg -     predniSONE (DELTASONE) 20 MG tablet; Take 2 tablets (40 mg total) by mouth daily with breakfast for 5 days.  URI, acute  Rapid Flu test negative. Symptoms suggests a viral etiology, symptomatic treatment recommended, so I do not think abx is needed at this time. Monitor for new symptoms. Instructed to monitor for signs of complications,clearly instructed about warning signs. I also explained that cough and nasal congestion can last a few days and sometimes weeks. Side effects of prescribed medications discussed. F/U as needed.   -     POCT Influenza A/B -     albuterol (PROVENTIL) (2.5 MG/3ML) 0.083% nebulizer solution 2.5 mg -     benzonatate (TESSALON) 100 MG capsule; Take 2 capsules (200 mg total) by mouth 2 (two) times  daily as needed for up to 10 days. -     HYDROcodone-homatropine (HYCODAN) 5-1.5 MG/5ML syrup; Take 5 mLs by mouth at bedtime as needed for up to 10 days for cough.      -Ms. Maria [name] was advised to seek attention immediately if symptoms worsen or to follow if they persist or new concerns arise.       Betty G. Martinique, MD  [facility]. [place] office.

## 2017-09-13 ENCOUNTER — Encounter: Payer: Self-pay | Admitting: Family Medicine

## 2017-09-13 NOTE — Telephone Encounter (Signed)
Dr. Elease Hashimoto - Please review and advise. Thanks!

## 2017-09-13 NOTE — Telephone Encounter (Signed)
[facility] Patient Name: Maria [name] Gender: Female DOB: [birth year] Age: 47 Y 18 M 4 D Return Phone Number: [phone] (Primary) Address: City/State/Zip: [place] [address] Client [facility] Night - Client Client Site [place] [place] - Night Physician Colin Benton- MD Contact Type Call Who Is Calling Patient / Member / Family / Caregiver Call Type Triage / Clinical Caller Name Elberta Fortis [place] Relationship To Patient Spouse Return Phone Number [phone] (Primary) Chief Complaint Cough Reason for Call Symptomatic / Request for [place] states his wife was seen today and she was given a breathing treatment and prednisone. He states she has not gotten any better. She has been coughing and experiencing pain. He would like for the MD to be made aware of this.  Paging DoctorName Phone DateTime Result/Outcome Message Type Notes Walker Kehr MD [phone] 09/12/2017 8:50:56 PM Called On Call Provider - Reached Doctor Paged Walker Kehr - MD 09/12/2017 8:52:23 PM Spoke with On Call - General Message Result Dr Alain Marion agrees with the disposition of go to ED now. An antibioitic is not going to make it easier for her to breathe right now, so she should be evaluated b/c that is the right thing to do.  Caller still resists taking her to ED & wont commit to taking her there tonight.

## 2017-09-27 ENCOUNTER — Other Ambulatory Visit: Payer: Self-pay

## 2017-10-02 ENCOUNTER — Ambulatory Visit: Payer: Self-pay | Admitting: Endocrinology

## 2017-10-26 ENCOUNTER — Other Ambulatory Visit: Payer: Self-pay | Admitting: Obstetrics and Gynecology

## 2017-10-26 ENCOUNTER — Other Ambulatory Visit: Payer: Self-pay | Admitting: Family Medicine

## 2017-10-26 DIAGNOSIS — Z1231 Encounter for screening mammogram for malignant neoplasm of breast: Secondary | ICD-10-CM

## 2017-11-01 ENCOUNTER — Telehealth: Payer: Self-pay | Admitting: Family Medicine

## 2017-11-01 NOTE — Telephone Encounter (Signed)
Copied from [place]. Topic: Referral - Request >> Nov 01, 2017  8:42 AM Conception Chancy, NT wrote: Patient is calling and states she fell down the stairs last night and hurt her knee. She has a appt at 9:15am at [facility], she is needing a referral.  >> Nov 01, 2017 10:25 AM Nimmons, Emilio Math, RN wrote: [facility] called this morning about this, I explained that Dr. Maudie Mercury is not in the office today, will forward this note for review.

## 2017-11-02 NOTE — Telephone Encounter (Signed)
I left a message for the pt to return my call. 

## 2017-11-02 NOTE — Telephone Encounter (Signed)
Please let her know not in office yesterday. Got this this morning. So sorry to hear she fell and I hope she is doing better.  Ok to refer if needed - or can come here for appt.

## 2017-11-08 ENCOUNTER — Other Ambulatory Visit (INDEPENDENT_AMBULATORY_CARE_PROVIDER_SITE_OTHER): Payer: No Typology Code available for payment source

## 2017-11-08 DIAGNOSIS — E039 Hypothyroidism, unspecified: Secondary | ICD-10-CM

## 2017-11-08 LAB — T4, FREE: FREE T4: 0.62 ng/dL (ref 0.60–1.60)

## 2017-11-08 LAB — T3, FREE: T3 FREE: 3.4 pg/mL (ref 2.3–4.2)

## 2017-11-08 LAB — TSH: TSH: 4.75 u[IU]/mL — ABNORMAL HIGH (ref 0.35–4.50)

## 2017-11-13 ENCOUNTER — Ambulatory Visit (INDEPENDENT_AMBULATORY_CARE_PROVIDER_SITE_OTHER): Payer: No Typology Code available for payment source | Admitting: Endocrinology

## 2017-11-13 ENCOUNTER — Encounter: Payer: Self-pay | Admitting: Endocrinology

## 2017-11-13 VITALS — BP 112/70 | HR 94 | Ht 59.5 in | Wt 214.0 lb

## 2017-11-13 DIAGNOSIS — E063 Autoimmune thyroiditis: Secondary | ICD-10-CM

## 2017-11-13 NOTE — Progress Notes (Signed)
Patient ID: Maria [name], female   DOB: [birth year], 47 y.o.   MRN: [id number]           Referring Physician: Colin Benton  Reason for Appointment:  Hypothyroidism, follow-up visit  Chief complaint: Tiredness, low thyroid   History of Present Illness:   Hypothyroidism was first diagnosed in 2014  At the time of diagnosis patient was having symptoms of lethargy  , cold sensitivity, difficulty concentrating, weight gain and muscle aches She thinks she had continued symptoms for a couple of years before she was diagnosed to have hypothyroidism in [place] However no records available of her original workup She was started on levothyroxine supplements and she felt significantly better with this but not back to normal   She had more improvement in her symptoms of brain fog and energy level as well as muscle aches.          The patient has been treated with mostly 75 g of levothyroxine In 2017 she was given additional doses of 88 g to take twice a week and 75 g was given the other 5 days of the week  RECENT history: She was seen in consultation in 8/18 because of complaining of lethargy and other symptoms as above Although she had a reasonably good level of T3 of 3.2 she has been given a trial of levothyroxine 62.5 and Cytomel 5 g daily as a trial         Subsequently she has still complained of some fatigue and no improvement in her energy level or weight Cytomel was increased up to 10 mcg on her last visit in 9/18 when T3 level was 2.7 Levothyroxine was continued at 62.5 mcg  However she did not follow-up as instructed 6 weeks later More recently she thinks she is feeling fairly well with only some fatigue Her weight has gone up again No complaints of cold intolerance or hair loss Still has some mild generalized aches  She is fairly consistent with taking her levothyroxine and Cytomel before breakfast daily  Her TSH is gradually increasing and now 4.75 Free T3 level is now  stable at 3.4 although her free T4 is down to 0.62    Patient's weight history is as follows:  Wt Readings from Last 3 Encounters:  11/13/17 214 lb (97.1 kg)  05/05/17 206 lb 6.4 oz (93.6 kg)  03/29/17 202 lb 6.4 oz (91.8 kg)    Thyroid function results have been as follows:  Lab Results  Component Value Date   TSH 4.75 (H) 11/08/2017   TSH 4.30 06/15/2017   TSH 3.36 05/04/2017   TSH 2.55 03/29/2017   FREET4 0.62 11/08/2017   FREET4 0.67 06/15/2017   FREET4 0.73 05/04/2017   FREET4 0.94 03/29/2017   T3FREE 3.4 11/08/2017   T3FREE 3.5 06/15/2017   T3FREE 2.7 05/04/2017     Past Medical History:  Diagnosis Date  . Abnormal mammogram of left breast 01/22/2016  . Anemia 11/21/2014   -with elevated platelets   . Anxiety   . Asthma   . Breast cyst 11/21/2014   -L breast, upper L just off from center -stable per radiology 10/2014   . Generalized anxiety disorder 11/21/2014  . Hypothyroidism   . Menorrhagia 11/21/2014   -seeing gyn    . Obesity 11/21/2014  . Plantar fasciitis of left foot 11/21/2014  . Seasonal allergies 11/21/2014  . Thyroid disease     Past Surgical History:  Procedure Laterality Date  . ABDOMINAL HYSTERECTOMY    .  BREAST BIOPSY    . BREAST EXCISIONAL BIOPSY    . BREAST LUMPECTOMY WITH RADIOACTIVE SEED LOCALIZATION Left 01/22/2016   Procedure: LEFT BREAST LUMPECTOMY WITH RADIOACTIVE SEED LOCALIZATION;  Surgeon: Fanny Skates, MD;  Location: [facility];  Service: General;  Laterality: Left;  . CESAREAN SECTION     x3  . CHOLECYSTECTOMY N/A 03/15/2016   Procedure: LAPAROSCOPIC CHOLECYSTECTOMY WITH INTRAOPERATIVE CHOLANGIOGRAM;  Surgeon: Jackolyn Confer, MD;  Location: [facility];  Service: General;  Laterality: N/A;  . CYSTO N/A 10/06/2015   Procedure: Consuela Mimes;  Surgeon: Nunzio Cobbs, MD;  Location: [facility];  Service: Gynecology;  Laterality: N/A;  . TUBAL LIGATION      Family History  Problem Relation Age of Onset  . Breast cancer  Mother 97       s/p lump and rad  . Skin cancer Mother        non-melanoma  . Skin cancer Father        non-melanoma  . Breast cancer Paternal Grandmother        dx 75s; s/p mastectomy  . Cervical cancer Maternal Grandmother   . Arthritis Maternal Grandmother   . CVA Maternal Grandmother   . Stroke Maternal Grandmother 80  . Heart attack Maternal Grandfather        d. 45s  . Colon cancer Paternal Grandfather 10  . Rectal cancer Maternal Uncle 54  . Aortic aneurysm Maternal Uncle 53  . Uterine cancer Other        maternal great grandmother (MGM's mother)  . Stroke Other   . Cancer Other        maternal great grandfather (MGM's father); NOS cancer  . Breast cancer Other        maternal great grandmother (MGF's mother)  . Colon cancer Other        maternal great grandfather (MGF's father)  . Heart attack Other   . Colon cancer Other        paternal great grandfather (PGF's father)  . Heart attack Other   . Thyroid disease Neg Hx     Social History:  reports that she has quit smoking. Her smoking use included cigarettes. She has never used smokeless tobacco. She reports that she drinks about 0.6 oz of alcohol per week. She reports that she does not use drugs.  Allergies: No Known Allergies  Allergies as of 11/13/2017   No Known Allergies     Medication List        Accurate as of 11/13/17  1:55 PM. Always use your most recent med list.          albuterol 108 (90 Base) MCG/ACT inhaler Commonly known as:  PROVENTIL HFA;VENTOLIN HFA Inhale 1 puff into the lungs every 6 (six) hours as needed for wheezing or shortness of breath.   fluticasone 50 MCG/ACT nasal spray Commonly known as:  FLONASE Place 2 sprays into both nostrils daily.   Fluticasone-Salmeterol 250-50 MCG/DOSE Aepb Commonly known as:  ADVAIR Inhale 1 puff into the lungs daily as needed (for shortness of breath).   levothyroxine 125 MCG tablet Commonly known as:  SYNTHROID, LEVOTHROID TAKE 0.5 TABLETS  (62.5 MCG TOTAL) DAILY BY MOUTH.   liothyronine 5 MCG tablet Commonly known as:  CYTOMEL Take 2 tabs with levothyroxine in the morning   loratadine 10 MG tablet Commonly known as:  CLARITIN Take 1 tablet (10 mg total) by mouth daily.   venlafaxine XR 150 MG 24 hr capsule Commonly known  as:  EFFEXOR-XR TAKE 1 CAPSULE BY MOUTH DAILY WITH BREAKFAST          Review of Systems              Examination:    BP 112/70 (BP Location: Left Arm, Patient Position: Sitting, Cuff Size: Normal)   Pulse 94   Ht 4' 11.5" (1.511 m)   Wt 214 lb (97.1 kg)   LMP 09/26/2015 (Exact Date)   SpO2 97%   BMI 42.50 kg/m   Thyroid not palpable Skin appears normal Biceps reflexes are normal  Assessment:  HYPOTHYROIDISM, Primary and diagnosed in 2014  Even with increasing her thyroid supplements on her last visit her TSH is continuing to increase This is likely to be from some progression of her hypothyroidism Since Cytomel had been increased and her T3 level is normal her hypothyroidism treatment needs to be adjusted with increasing her levothyroxine She has been regular with taking her supplements  OBESITY and anxiety disorder: She will continue to follow-up with PCP  PLAN:  Levothyroxine 75 mcg Continue liothyronine 10 mcg daily Follow-up in 2 months   Treonna Klee 11/13/2017, 1:55 PM    Note: This office note was prepared with Dragon voice recognition system technology. Any transcriptional errors that result from this process are unintentional.

## 2017-11-14 MED ORDER — LEVOTHYROXINE SODIUM 75 MCG PO TABS: 75.0000 ug | ORAL_TABLET | Freq: Every day | ORAL | 0 refills | Status: DC

## 2017-11-16 ENCOUNTER — Other Ambulatory Visit: Payer: Self-pay

## 2017-11-16 ENCOUNTER — Telehealth: Payer: Self-pay | Admitting: Endocrinology

## 2017-11-16 MED ORDER — LIOTHYRONINE SODIUM 5 MCG PO TABS: ORAL_TABLET | ORAL | 2 refills | Status: DC

## 2017-11-16 NOTE — Telephone Encounter (Signed)
This has been done.

## 2017-11-16 NOTE — Telephone Encounter (Signed)
RX for Liothyronine was not sent to PHARM-patient is out of med-Pharmacy rcvd other script for Levothyroxine but not Liothyronine. Please send RX for Liothyronine asap to [facility]

## 2017-11-23 ENCOUNTER — Encounter (INDEPENDENT_AMBULATORY_CARE_PROVIDER_SITE_OTHER): Payer: Self-pay

## 2017-11-27 ENCOUNTER — Telehealth: Payer: Self-pay | Admitting: Obstetrics and Gynecology

## 2017-11-27 ENCOUNTER — Ambulatory Visit: Payer: 59 | Admitting: Obstetrics and Gynecology

## 2017-11-27 NOTE — Telephone Encounter (Signed)
Patient cancelled aex appointment today because she is sick. Will call back to reschedule.

## 2017-11-27 NOTE — Telephone Encounter (Signed)
Thank you for the update!

## 2017-11-27 NOTE — Progress Notes (Unsigned)
47 y.o. G60P3000 Married Caucasian female here for annual exam.    PCP:     Patient's last menstrual period was 09/26/2015 (exact date).           Sexually active: {yes no:314532}  The current method of family planning is status post hysterectomy.    Exercising: {yes no:314532}  {types:19826} Smoker:  {YES [id number]  Health Maintenance: Pap:  08/20/15, negative with neg HR HPV History of abnormal Pap:  {YES NO:22349} MMG:  01/23/17 BIRADS 1 negative/density c -- scheduled 01/29/18 TDaP:  11/21/14 Gardasil:   no HIV:  Hep C: Screening Labs:  Hb today: ***, Urine today: ***   reports that she has quit smoking. Her smoking use included cigarettes. She has never used smokeless tobacco. She reports that she drinks about 0.6 oz of alcohol per week. She reports that she does not use drugs.  Past Medical History:  Diagnosis Date  . Abnormal mammogram of left breast 01/22/2016  . Anemia 11/21/2014   -with elevated platelets   . Anxiety   . Asthma   . Breast cyst 11/21/2014   -L breast, upper L just off from center -stable per radiology 10/2014   . Generalized anxiety disorder 11/21/2014  . Hypothyroidism   . Menorrhagia 11/21/2014   -seeing gyn    . Obesity 11/21/2014  . Plantar fasciitis of left foot 11/21/2014  . Seasonal allergies 11/21/2014  . Thyroid disease     Past Surgical History:  Procedure Laterality Date  . ABDOMINAL HYSTERECTOMY    . BREAST BIOPSY    . BREAST EXCISIONAL BIOPSY    . BREAST LUMPECTOMY WITH RADIOACTIVE SEED LOCALIZATION Left 01/22/2016   Procedure: LEFT BREAST LUMPECTOMY WITH RADIOACTIVE SEED LOCALIZATION;  Surgeon: Fanny Skates, MD;  Location: [facility];  Service: General;  Laterality: Left;  . CESAREAN SECTION     x3  . CHOLECYSTECTOMY N/A 03/15/2016   Procedure: LAPAROSCOPIC CHOLECYSTECTOMY WITH INTRAOPERATIVE CHOLANGIOGRAM;  Surgeon: Jackolyn Confer, MD;  Location: [facility];  Service: General;  Laterality: N/A;  . CYSTO N/A 10/06/2015   Procedure:  Consuela Mimes;  Surgeon: Nunzio Cobbs, MD;  Location: [facility];  Service: Gynecology;  Laterality: N/A;  . TUBAL LIGATION      Current Outpatient Medications  Medication Sig Dispense Refill  . albuterol (PROVENTIL HFA;VENTOLIN HFA) 108 (90 Base) MCG/ACT inhaler Inhale 1 puff into the lungs every 6 (six) hours as needed for wheezing or shortness of breath. 3 Inhaler 0  . fluticasone (FLONASE) 50 MCG/ACT nasal spray Place 2 sprays into both nostrils daily. 16 g 1  . Fluticasone-Salmeterol (ADVAIR) 250-50 MCG/DOSE AEPB Inhale 1 puff into the lungs daily as needed (for shortness of breath). 60 each 3  . levothyroxine (SYNTHROID, LEVOTHROID) 75 MCG tablet Take 1 tablet (75 mcg total) by mouth daily. 90 tablet 0  . liothyronine (CYTOMEL) 5 MCG tablet Take 2 tabs with levothyroxine in the morning 180 tablet 2  . loratadine (CLARITIN) 10 MG tablet Take 1 tablet (10 mg total) by mouth daily. 90 tablet 0  . venlafaxine XR (EFFEXOR-XR) 150 MG 24 hr capsule TAKE 1 CAPSULE BY MOUTH DAILY WITH BREAKFAST 30 capsule 0   No current facility-administered medications for this visit.     Family History  Problem Relation Age of Onset  . Breast cancer Mother 28       s/p lump and rad  . Skin cancer Mother        non-melanoma  . Skin cancer Father  non-melanoma  . Breast cancer Paternal Grandmother        dx 56s; s/p mastectomy  . Cervical cancer Maternal Grandmother   . Arthritis Maternal Grandmother   . CVA Maternal Grandmother   . Stroke Maternal Grandmother 80  . Heart attack Maternal Grandfather        d. 77s  . Colon cancer Paternal Grandfather 85  . Rectal cancer Maternal Uncle 54  . Aortic aneurysm Maternal Uncle 53  . Uterine cancer Other        maternal great grandmother (MGM's mother)  . Stroke Other   . Cancer Other        maternal great grandfather (MGM's father); NOS cancer  . Breast cancer Other        maternal great grandmother (MGF's mother)  . Colon cancer Other         maternal great grandfather (MGF's father)  . Heart attack Other   . Colon cancer Other        paternal great grandfather (PGF's father)  . Heart attack Other   . Thyroid disease Neg Hx     ROS:  Pertinent items are noted in HPI.  Otherwise, a comprehensive ROS was negative.  Exam:   LMP 09/26/2015 (Exact Date)     General appearance: alert, cooperative and appears stated age Head: Normocephalic, without obvious abnormality, atraumatic Neck: no adenopathy, supple, symmetrical, trachea midline and thyroid normal to inspection and palpation Lungs: clear to auscultation bilaterally Breasts: normal appearance, no masses or tenderness, No nipple retraction or dimpling, No nipple discharge or bleeding, No axillary or supraclavicular adenopathy Heart: regular rate and rhythm Abdomen: soft, non-tender; no masses, no organomegaly Extremities: extremities normal, atraumatic, no cyanosis or edema Skin: Skin color, texture, turgor normal. No rashes or lesions Lymph nodes: Cervical, supraclavicular, and axillary nodes normal. No abnormal inguinal nodes palpated Neurologic: Grossly normal  Pelvic: External genitalia:  no lesions              Urethra:  normal appearing urethra with no masses, tenderness or lesions              Bartholins and Skenes: normal                 Vagina: normal appearing vagina with normal color and discharge, no lesions              Cervix: no lesions              Pap taken: {yes no:314532} Bimanual Exam:  Uterus:  normal size, contour, position, consistency, mobility, non-tender              Adnexa: no mass, fullness, tenderness              Rectal exam: {yes no:314532}.  Confirms.              Anus:  normal sphincter tone, no lesions  Chaperone was present for exam.  Assessment:   Well woman visit with normal exam.   Plan: Mammogram screening discussed. Recommended self breast awareness. Pap and HR HPV as above. Guidelines for Calcium, Vitamin D, regular  exercise program including cardiovascular and weight bearing exercise.   Follow up annually and prn.   Additional counseling given.  {yes [id number]. _______ minutes face to face time of which over 50% was spent in counseling.    After visit summary provided.

## 2017-11-28 ENCOUNTER — Other Ambulatory Visit: Payer: Self-pay | Admitting: *Deleted

## 2017-11-28 DIAGNOSIS — E669 Obesity, unspecified: Secondary | ICD-10-CM

## 2017-11-29 ENCOUNTER — Other Ambulatory Visit: Payer: Self-pay | Admitting: Family Medicine

## 2017-11-30 ENCOUNTER — Ambulatory Visit (INDEPENDENT_AMBULATORY_CARE_PROVIDER_SITE_OTHER): Payer: No Typology Code available for payment source | Admitting: Family Medicine

## 2017-11-30 ENCOUNTER — Encounter (INDEPENDENT_AMBULATORY_CARE_PROVIDER_SITE_OTHER): Payer: Self-pay | Admitting: Family Medicine

## 2017-11-30 VITALS — BP 123/80 | HR 78 | Temp 97.9°F | Ht 60.0 in | Wt 212.0 lb

## 2017-11-30 DIAGNOSIS — Z9189 Other specified personal risk factors, not elsewhere classified: Secondary | ICD-10-CM

## 2017-11-30 DIAGNOSIS — Z0289 Encounter for other administrative examinations: Secondary | ICD-10-CM

## 2017-11-30 DIAGNOSIS — R5383 Other fatigue: Secondary | ICD-10-CM | POA: Diagnosis not present

## 2017-11-30 DIAGNOSIS — Z6841 Body Mass Index (BMI) 40.0 and over, adult: Secondary | ICD-10-CM | POA: Diagnosis not present

## 2017-11-30 DIAGNOSIS — Z1331 Encounter for screening for depression: Secondary | ICD-10-CM

## 2017-11-30 DIAGNOSIS — R0602 Shortness of breath: Secondary | ICD-10-CM

## 2017-12-01 LAB — COMPREHENSIVE METABOLIC PANEL
ALBUMIN: 4.2 g/dL (ref 3.5–5.5)
ALK PHOS: 95 IU/L (ref 39–117)
ALT: 16 IU/L (ref 0–32)
AST: 16 IU/L (ref 0–40)
Albumin/Globulin Ratio: 1.8 (ref 1.2–2.2)
BILIRUBIN TOTAL: 0.2 mg/dL (ref 0.0–1.2)
BUN / CREAT RATIO: 12 (ref 9–23)
BUN: 9 mg/dL (ref 6–24)
CHLORIDE: 102 mmol/L (ref 96–106)
CO2: 23 mmol/L (ref 20–29)
CREATININE: 0.74 mg/dL (ref 0.57–1.00)
Calcium: 9 mg/dL (ref 8.7–10.2)
GFR calc Af Amer: 112 mL/min/{1.73_m2} (ref >59)
GFR calc non Af Amer: 97 mL/min/{1.73_m2} (ref >59)
GLUCOSE: 88 mg/dL (ref 65–99)
Globulin, Total: 2.4 g/dL (ref 1.5–4.5)
Potassium: 4.6 mmol/L (ref 3.5–5.2)
SODIUM: 139 mmol/L (ref 134–144)
Total Protein: 6.6 g/dL (ref 6.0–8.5)

## 2017-12-01 LAB — LIPID PANEL
CHOL/HDL RATIO: 3.3 ratio (ref 0.0–4.4)
CHOLESTEROL TOTAL: 210 mg/dL — AB (ref 100–199)
HDL: 63 mg/dL (ref >39)
LDL CALC: 127 mg/dL — AB (ref 0–99)
Triglycerides: 98 mg/dL (ref 0–149)
VLDL Cholesterol Cal: 20 mg/dL (ref 5–40)

## 2017-12-01 LAB — CBC WITH DIFFERENTIAL/PLATELET
BASOS ABS: 0 10*3/uL (ref 0.0–0.2)
Basos: 0 %
EOS (ABSOLUTE): 0 10*3/uL (ref 0.0–0.4)
Eos: 0 %
Hematocrit: 41.6 % (ref 34.0–46.6)
Hemoglobin: 13.5 g/dL (ref 11.1–15.9)
Immature Grans (Abs): 0 10*3/uL (ref 0.0–0.1)
Immature Granulocytes: 0 %
LYMPHS ABS: 1.8 10*3/uL (ref 0.7–3.1)
Lymphs: 22 %
MCH: 28.8 pg (ref 26.6–33.0)
MCHC: 32.5 g/dL (ref 31.5–35.7)
MCV: 89 fL (ref 79–97)
MONOS ABS: 0.6 10*3/uL (ref 0.1–0.9)
Monocytes: 7 %
NEUTROS ABS: 5.5 10*3/uL (ref 1.4–7.0)
Neutrophils: 71 %
Platelets: 399 10*3/uL — ABNORMAL HIGH (ref 150–379)
RBC: 4.68 x10E6/uL (ref 3.77–5.28)
RDW: 13.5 % (ref 12.3–15.4)
WBC: 7.9 10*3/uL (ref 3.4–10.8)

## 2017-12-01 LAB — HEMOGLOBIN A1C
ESTIMATED AVERAGE GLUCOSE: 100 mg/dL
Hgb A1c MFr Bld: 5.1 % (ref 4.8–5.6)

## 2017-12-01 LAB — VITAMIN B12: VITAMIN B 12: 413 pg/mL (ref 232–1245)

## 2017-12-01 LAB — INSULIN, RANDOM: INSULIN: 21.5 u[IU]/mL (ref 2.6–24.9)

## 2017-12-01 LAB — VITAMIN D 25 HYDROXY (VIT D DEFICIENCY, FRACTURES): VIT D 25 HYDROXY: 19.7 ng/mL — AB (ref 30.0–100.0)

## 2017-12-06 NOTE — Progress Notes (Signed)
.  Office: [phone]  /  Fax: [phone]   HPI:   Chief Complaint: OBESITY  Maria [name] (MR# [id number]) is a 47 y.o. female who presents on 12/06/2017 for obesity evaluation and treatment. Current BMI is Body mass index is 41.4 kg/m.Maria [name] has struggled with obesity for years and has been unsuccessful in either losing weight or maintaining long term weight loss. [name] states weight gain started with hypothyroid and she has tried "all of them" ways to lose weight unsuccessfully. [name] eats out very frequently. Her husband does the shopping and cooking. [name] attended our information session and states she is currently in the action stage of change and ready to dedicate time achieving and maintaining a healthier weight.  [name] states her family eats meals together she thinks her family will eat healthier with  her her desired weight loss is 74 lbs she started gaining weight in the last 5 yrs her heaviest weight ever was 214 lbs. she is a picky eater and doesn't like to eat healthier foods  she has significant food cravings issues  she snacks frequently in the evenings she skips meals frequently she is frequently drinking liquids with calories she frequently makes poor food choices she has problems with excessive hunger  she frequently eats larger portions than normal  she has binge eating behaviors she struggles with emotional eating    Fatigue [name] feels her energy is lower than it should be. This has worsened with weight gain and has not worsened recently. [name] admits to daytime somnolence and  admits to waking up still tired. Patient is at risk for obstructive sleep apnea. Patent has a history of symptoms of daytime fatigue and morning fatigue. Patient generally gets 7 hours of sleep per night, and states they generally have restless sleep. Snoring is present. Apneic episodes are not present. Epworth Sleepiness Score is 5  Dyspnea on exertion Morey Hummingbird notes increasing  shortness of breath with exercising and seems to be worsening over time with weight gain. She notes getting out of breath sooner with activity than she used to. This has not gotten worse recently. [name] denies orthopnea.  At risk for cardiovascular disease [name] is at a higher than average risk for cardiovascular disease due to obesity. She currently denies any chest pain.  Depression Screen Dalton's Food and Mood (modified PHQ-9) score was  Depression screen PHQ 2/9 11/30/2017  Decreased Interest 1  Down, Depressed, Hopeless 1  PHQ - 2 Score 2  Altered sleeping 0  Tired, decreased energy 1  Change in appetite 1  Feeling bad or failure about yourself  0  Trouble concentrating 0  Moving slowly or fidgety/restless 0  Suicidal thoughts 0  PHQ-9 Score 4  Difficult doing work/chores Not difficult at all    ALLERGIES: No Known Allergies  MEDICATIONS: Current Outpatient Medications on File Prior to Visit  Medication Sig Dispense Refill  . albuterol (PROVENTIL HFA;VENTOLIN HFA) 108 (90 Base) MCG/ACT inhaler Inhale 1 puff into the lungs every 6 (six) hours as needed for wheezing or shortness of breath. 3 Inhaler 0  . levothyroxine (SYNTHROID, LEVOTHROID) 75 MCG tablet Take 1 tablet (75 mcg total) by mouth daily. 90 tablet 0  . liothyronine (CYTOMEL) 5 MCG tablet Take 2 tabs with levothyroxine in the morning 180 tablet 2  . loratadine (CLARITIN) 10 MG tablet Take 1 tablet (10 mg total) by mouth daily. 90 tablet 0  . venlafaxine XR (EFFEXOR-XR) 150 MG 24 hr capsule TAKE 1 CAPSULE BY MOUTH DAILY WITH  BREAKFAST 30 capsule 0   No current facility-administered medications on file prior to visit.     PAST MEDICAL HISTORY: Past Medical History:  Diagnosis Date  . Abnormal mammogram of left breast 01/22/2016  . Anemia 11/21/2014   -with elevated platelets   . Anxiety   . Asthma   . Breast cyst 11/21/2014   -L breast, upper L just off from center -stable per radiology 10/2014   . Constipation    . Dyspnea   . Fatty liver   . Gallbladder problem   . Generalized anxiety disorder 11/21/2014  . GERD (gastroesophageal reflux disease)   . Hypothyroidism   . IBS (irritable bowel syndrome)   . Menorrhagia 11/21/2014   -seeing gyn    . Obesity 11/21/2014  . Plantar fasciitis of left foot 11/21/2014  . Seasonal allergies 11/21/2014  . Thyroid disease     PAST SURGICAL HISTORY: Past Surgical History:  Procedure Laterality Date  . ABDOMINAL HYSTERECTOMY    . BREAST BIOPSY    . BREAST EXCISIONAL BIOPSY    . BREAST LUMPECTOMY WITH RADIOACTIVE SEED LOCALIZATION Left 01/22/2016   Procedure: LEFT BREAST LUMPECTOMY WITH RADIOACTIVE SEED LOCALIZATION;  Surgeon: Fanny Skates, MD;  Location: [facility];  Service: General;  Laterality: Left;  . CESAREAN SECTION     x3  . CHOLECYSTECTOMY N/A 03/15/2016   Procedure: LAPAROSCOPIC CHOLECYSTECTOMY WITH INTRAOPERATIVE CHOLANGIOGRAM;  Surgeon: Jackolyn Confer, MD;  Location: [facility];  Service: General;  Laterality: N/A;  . CYSTO N/A 10/06/2015   Procedure: Consuela Mimes;  Surgeon: Nunzio Cobbs, MD;  Location: [facility];  Service: Gynecology;  Laterality: N/A;  . TUBAL LIGATION      SOCIAL HISTORY: Social History   Tobacco Use  . Smoking status: Former Smoker    Types: Cigarettes  . Smokeless tobacco: Never Used  Substance Use Topics  . Alcohol use: Yes    Alcohol/week: 0.6 oz    Types: 1 Glasses of wine per week    Comment: socially  . Drug use: No    FAMILY HISTORY: Family History  Problem Relation Age of Onset  . Breast cancer Mother 59       s/p lump and rad  . Skin cancer Mother        non-melanoma  . Thyroid disease Mother   . Skin cancer Father        non-melanoma  . Hypertension Father   . Hyperlipidemia Father   . Breast cancer Paternal Grandmother        dx 73s; s/p mastectomy  . Cervical cancer Maternal Grandmother   . Arthritis Maternal Grandmother   . CVA Maternal Grandmother   . Stroke Maternal  Grandmother 80  . Heart attack Maternal Grandfather        d. 16s  . Colon cancer Paternal Grandfather 24  . Rectal cancer Maternal Uncle 54  . Aortic aneurysm Maternal Uncle 53  . Uterine cancer Other        maternal great grandmother (MGM's mother)  . Stroke Other   . Cancer Other        maternal great grandfather (MGM's father); NOS cancer  . Breast cancer Other        maternal great grandmother (MGF's mother)  . Colon cancer Other        maternal great grandfather (MGF's father)  . Heart attack Other   . Colon cancer Other        paternal great grandfather (PGF's father)  .  Heart attack Other     ROS: Review of Systems  Constitutional: Positive for malaise/fatigue.  Eyes:       Wear Glasses or Contacts  Respiratory: Positive for shortness of breath (on exertion).   Cardiovascular: Negative for orthopnea.  Gastrointestinal: Positive for diarrhea.  Neurological: Positive for weakness and headaches.  Endo/Heme/Allergies:       Heat or Cold Intolerance  Psychiatric/Behavioral: The patient has insomnia.        Stress    PHYSICAL EXAM: Blood pressure 123/80, pulse 78, temperature 97.9 F (36.6 C), temperature source Oral, height 5' (1.524 m), weight 212 lb (96.2 kg), last menstrual period 09/26/2015, SpO2 97 %. Body mass index is 41.4 kg/m. Physical Exam  Constitutional: She is oriented to person, place, and time. She appears well-developed and well-nourished.  HENT:  Head: Normocephalic and atraumatic.  Eyes: EOM are normal. No scleral icterus.  Neck: Normal range of motion. Neck supple. No thyromegaly present.  Cardiovascular: Normal rate.  Murmur heard.  Systolic (early) murmur is present with a grade of 1/6. Pulmonary/Chest: Effort normal. No respiratory distress.  Abdominal: Soft. There is no tenderness.  + obesity  Musculoskeletal: Normal range of motion.  Range of Motion normal in all 4 extremities  Neurological: She is alert and oriented to person, place,  and time. Coordination normal.  Skin: Skin is warm and dry.  Psychiatric: She has a normal mood and affect. Her behavior is normal.  Vitals reviewed.   RECENT LABS AND TESTS: BMET    Component Value Date/Time   NA 139 11/30/2017 0934   NA 139 02/19/2015 0911   K 4.6 11/30/2017 0934   K 4.0 02/19/2015 0911   CL 102 11/30/2017 0934   CO2 23 11/30/2017 0934   CO2 23 02/19/2015 0911   GLUCOSE 88 11/30/2017 0934   GLUCOSE 78 03/24/2016 1433   GLUCOSE 96 02/19/2015 0911   BUN 9 11/30/2017 0934   BUN 11.5 02/19/2015 0911   CREATININE 0.74 11/30/2017 0934   CREATININE 0.8 02/19/2015 0911   CALCIUM 9.0 11/30/2017 0934   CALCIUM 9.6 02/19/2015 0911   GFRNONAA 97 11/30/2017 0934   GFRAA 112 11/30/2017 0934   Lab Results  Component Value Date   HGBA1C 5.1 11/30/2017   Lab Results  Component Value Date   INSULIN 21.5 11/30/2017   CBC    Component Value Date/Time   WBC 7.9 11/30/2017 0934   WBC 8.9 03/29/2017 1428   RBC 4.68 11/30/2017 0934   RBC 4.41 03/29/2017 1428   HGB 13.5 11/30/2017 0934   HGB 12.8 02/19/2015 0911   HCT 41.6 11/30/2017 0934   HCT 38.3 02/19/2015 0911   PLT 399 (H) 11/30/2017 0934   MCV 89 11/30/2017 0934   MCV 83.6 02/19/2015 0911   MCH 28.8 11/30/2017 0934   MCH 28.7 03/17/2016 1048   MCHC 32.5 11/30/2017 0934   MCHC 33.5 03/29/2017 1428   RDW 13.5 11/30/2017 0934   RDW 17.0 (H) 02/19/2015 0911   LYMPHSABS 1.8 11/30/2017 0934   LYMPHSABS 2.0 02/19/2015 0911   MONOABS 0.6 03/29/2017 1428   MONOABS 0.7 02/19/2015 0911   EOSABS 0.0 11/30/2017 0934   BASOSABS 0.0 11/30/2017 0934   BASOSABS 0.1 02/19/2015 0911   Iron/TIBC/Ferritin/ %Sat    Component Value Date/Time   IRON 50 02/19/2015 0911   TIBC 359 02/19/2015 0911   FERRITIN 27 02/19/2015 0911   IRONPCTSAT 14 (L) 02/19/2015 0911   Lipid Panel     Component Value Date/Time  CHOL 210 (H) 11/30/2017 0934   TRIG 98 11/30/2017 0934   HDL 63 11/30/2017 0934   CHOLHDL 3.3 11/30/2017  0934   CHOLHDL 3 02/23/2015 1018   VLDL 30.2 02/23/2015 1018   LDLCALC 127 (H) 11/30/2017 0934   Hepatic Function Panel     Component Value Date/Time   PROT 6.6 11/30/2017 0934   PROT 6.9 02/19/2015 0911   ALBUMIN 4.2 11/30/2017 0934   ALBUMIN 3.5 02/19/2015 0911   AST 16 11/30/2017 0934   AST 13 02/19/2015 0911   ALT 16 11/30/2017 0934   ALT 14 02/19/2015 0911   ALKPHOS 95 11/30/2017 0934   ALKPHOS 84 02/19/2015 0911   BILITOT 0.2 11/30/2017 0934   BILITOT 0.23 02/19/2015 0911      Component Value Date/Time   TSH 4.75 (H) 11/08/2017 0854   Vitamin D There are no recent results  ECG  shows NSR with a rate of 76 BPM INDIRECT CALORIMETER done today shows a VO2 of 257 and a REE of 1791. Her calculated basal metabolic rate is 9562 thus her basal metabolic rate is better than expected.    ASSESSMENT AND PLAN: Other fatigue - Plan: EKG 12-Lead, CBC with Differential/Platelet, Comprehensive metabolic panel, Hemoglobin A1c, Insulin, random, Lipid panel, Vitamin B12, VITAMIN D 25 Hydroxy (Vit-D Deficiency, Fractures)  Shortness of breath on exertion  At risk for heart disease  Screening for depression  Class 3 severe obesity with serious comorbidity and body mass index (BMI) of 40.0 to 44.9 in adult, unspecified obesity type (HCC)  PLAN:  Fatigue Yanelle was informed that her fatigue may be related to obesity, depression or many other causes. Labs will be ordered, and in the meanwhile Lianette has agreed to work on diet, exercise and weight loss to help with fatigue. Proper sleep hygiene was discussed including the need for 7-8 hours of quality sleep each night. A sleep study was not ordered based on symptoms and Epworth score.  Dyspnea on exertion Donye's shortness of breath appears to be obesity related and exercise induced. She has agreed to work on weight loss and gradually increase exercise to treat her exercise induced shortness of breath. If Sherita follows our  instructions and loses weight without improvement of her shortness of breath, we will plan to refer to pulmonology. We will monitor this condition regularly. Georga agrees to this plan.  Cardiovascular risk counselling Mylinda was given extended (15 minutes) coronary artery disease prevention counseling today. She is 47 y.o. female and has risk factors for heart disease including obesity. We discussed intensive lifestyle modifications today with an emphasis on specific weight loss instructions and strategies. Pt was also informed of the importance of increasing exercise and decreasing saturated fats to help prevent heart disease.  Depression Screen Adara had a negative depression screening. Depression is commonly associated with obesity and often results in emotional eating behaviors. We will monitor this closely and work on CBT to help improve the non-hunger eating patterns. Referral to Psychology may be required if no improvement is seen as she continues in our clinic.  Obesity Alylah is currently in the action stage of change and her goal is to continue with weight loss efforts She has agreed to follow the Category 2 plan +100 calories Jayline has been instructed to work up to a goal of 150 minutes of combined cardio and strengthening exercise per week for weight loss and overall health benefits. We discussed the following Behavioral Modification Strategies today: increasing lean protein intake  Cricket has  agreed to follow up with our clinic in 2 weeks. She was informed of the importance of frequent follow up visits to maximize her success with intensive lifestyle modifications for her multiple health conditions. She was informed we would discuss her lab results at her next visit unless there is a critical issue that needs to be addressed sooner. Drena agreed to keep her next visit at the agreed upon time to discuss these results.    OBESITY BEHAVIORAL INTERVENTION VISIT  Today's visit was #  1 out of 22.  Starting weight: 212 lbs Starting date: 11/30/17 Today's weight : 212 lbs  Today's date: 11/30/2017 Total lbs lost to date: 0 (Patients must lose 7 lbs in the first 6 months to continue with counseling)   ASK: We discussed the diagnosis of obesity with Morey Hummingbird Devin today and Ercilia agreed to give [name] permission to discuss obesity behavioral modification therapy today.  ASSESS: Mercy has the diagnosis of obesity and her BMI today is 41.4 Marai is in the action stage of change   ADVISE: Emerald was educated on the multiple health risks of obesity as well as the benefit of weight loss to improve her health. She was advised of the need for long term treatment and the importance of lifestyle modifications.  AGREE: Multiple dietary modification options and treatment options were discussed and  Amere agreed to the above obesity treatment plan.   I, Doreene Nest, am acting as transcriptionist for Dennard Nip, MD   I have reviewed the above documentation for accuracy and completeness, and I agree with the above. -Dennard Nip, MD

## 2017-12-14 ENCOUNTER — Ambulatory Visit (INDEPENDENT_AMBULATORY_CARE_PROVIDER_SITE_OTHER): Payer: No Typology Code available for payment source | Admitting: Family Medicine

## 2017-12-14 VITALS — BP 117/75 | HR 95 | Temp 98.2°F | Ht 60.0 in | Wt 209.0 lb

## 2017-12-14 DIAGNOSIS — Z6841 Body Mass Index (BMI) 40.0 and over, adult: Secondary | ICD-10-CM

## 2017-12-14 DIAGNOSIS — E8881 Metabolic syndrome: Secondary | ICD-10-CM | POA: Insufficient documentation

## 2017-12-14 DIAGNOSIS — Z9189 Other specified personal risk factors, not elsewhere classified: Secondary | ICD-10-CM | POA: Diagnosis not present

## 2017-12-14 DIAGNOSIS — E559 Vitamin D deficiency, unspecified: Secondary | ICD-10-CM | POA: Diagnosis not present

## 2017-12-14 MED ORDER — VITAMIN D (ERGOCALCIFEROL) 1.25 MG (50000 UNIT) PO CAPS: 50000.0000 [IU] | ORAL_CAPSULE | ORAL | 0 refills | Status: DC

## 2017-12-14 MED ORDER — METFORMIN HCL 500 MG PO TABS: 500.0000 mg | ORAL_TABLET | Freq: Every day | ORAL | 0 refills | Status: DC

## 2017-12-19 NOTE — Progress Notes (Signed)
Office: [phone]  /  Fax: [phone]   HPI:   Chief Complaint: OBESITY Maria [name] is here to discuss her progress with her obesity treatment plan. She is on the Category 2 plan +100 calories and is following her eating plan approximately 95 % of the time. She states she is exercising 0 minutes 0 times per week. Maria [name] did well with weight loss on the category 2 plan. She struggled to eat all her food, but did well with planning meals. Her weight is 209 lb (94.8 kg) today and has had a weight loss of 3 pounds over a period of 2 weeks since her last visit. She has lost 3 lbs since starting treatment with [name].  Vitamin D deficiency (new) Maria [name] has a new diagnosis of vitamin D deficiency. Maria [name] is not currently taking vit D. She admits fatigue and denies nausea, vomiting or muscle weakness.  Insulin Resistance (new) Maria [name] has a new diagnosis of insulin resistance based on her elevated fasting insulin level >5. Although Maria [name]'s blood glucose readings are still under good control, insulin resistance puts her at greater risk of metabolic syndrome and diabetes. She admits polyphagia in the evening, but this improved with diet prescription. She is not taking metformin currently and continues to work on diet and exercise to decrease risk of diabetes.  At risk for diabetes Maria [name] is at higher than average risk for developing diabetes due to her obesity and insulin resistance. She currently denies polyuria or polydipsia.  ALLERGIES: No Known Allergies  MEDICATIONS: Current Outpatient Medications on File Prior to Visit  Medication Sig Dispense Refill  . albuterol (PROVENTIL HFA;VENTOLIN HFA) 108 (90 Base) MCG/ACT inhaler Inhale 1 puff into the lungs every 6 (six) hours as needed for wheezing or shortness of breath. 3 Inhaler 0  . levothyroxine (SYNTHROID, LEVOTHROID) 75 MCG tablet Take 1 tablet (75 mcg total) by mouth daily. 90 tablet 0  . liothyronine (CYTOMEL) 5 MCG tablet Take 2 tabs with  levothyroxine in the morning 180 tablet 2  . loratadine (CLARITIN) 10 MG tablet Take 1 tablet (10 mg total) by mouth daily. 90 tablet 0  . venlafaxine XR (EFFEXOR-XR) 150 MG 24 hr capsule TAKE 1 CAPSULE BY MOUTH DAILY WITH BREAKFAST 30 capsule 0   No current facility-administered medications on file prior to visit.     PAST MEDICAL HISTORY: Past Medical History:  Diagnosis Date  . Abnormal mammogram of left breast 01/22/2016  . Anemia 11/21/2014   -with elevated platelets   . Anxiety   . Asthma   . Breast cyst 11/21/2014   -L breast, upper L just off from center -stable per radiology 10/2014   . Constipation   . Dyspnea   . Fatty liver   . Gallbladder problem   . Generalized anxiety disorder 11/21/2014  . GERD (gastroesophageal reflux disease)   . Hypothyroidism   . IBS (irritable bowel syndrome)   . Menorrhagia 11/21/2014   -seeing gyn    . Obesity 11/21/2014  . Plantar fasciitis of left foot 11/21/2014  . Seasonal allergies 11/21/2014  . Thyroid disease     PAST SURGICAL HISTORY: Past Surgical History:  Procedure Laterality Date  . ABDOMINAL HYSTERECTOMY    . BREAST BIOPSY    . BREAST EXCISIONAL BIOPSY    . BREAST LUMPECTOMY WITH RADIOACTIVE SEED LOCALIZATION Left 01/22/2016   Procedure: LEFT BREAST LUMPECTOMY WITH RADIOACTIVE SEED LOCALIZATION;  Surgeon: Fanny Skates, MD;  Location: [facility];  Service: General;  Laterality: Left;  . CESAREAN  SECTION     x3  . CHOLECYSTECTOMY N/A 03/15/2016   Procedure: LAPAROSCOPIC CHOLECYSTECTOMY WITH INTRAOPERATIVE CHOLANGIOGRAM;  Surgeon: Jackolyn Confer, MD;  Location: [facility];  Service: General;  Laterality: N/A;  . CYSTO N/A 10/06/2015   Procedure: Consuela Mimes;  Surgeon: Nunzio Cobbs, MD;  Location: [facility];  Service: Gynecology;  Laterality: N/A;  . TUBAL LIGATION      SOCIAL HISTORY: Social History   Tobacco Use  . Smoking status: Former Smoker    Types: Cigarettes  . Smokeless tobacco: Never Used  Substance  Use Topics  . Alcohol use: Yes    Alcohol/week: 0.6 oz    Types: 1 Glasses of wine per week    Comment: socially  . Drug use: No    FAMILY HISTORY: Family History  Problem Relation Age of Onset  . Breast cancer Mother 86       s/p lump and rad  . Skin cancer Mother        non-melanoma  . Thyroid disease Mother   . Skin cancer Father        non-melanoma  . Hypertension Father   . Hyperlipidemia Father   . Breast cancer Paternal Grandmother        dx 92s; s/p mastectomy  . Cervical cancer Maternal Grandmother   . Arthritis Maternal Grandmother   . CVA Maternal Grandmother   . Stroke Maternal Grandmother 80  . Heart attack Maternal Grandfather        d. 75s  . Colon cancer Paternal Grandfather 12  . Rectal cancer Maternal Uncle 54  . Aortic aneurysm Maternal Uncle 53  . Uterine cancer Other        maternal great grandmother (MGM's mother)  . Stroke Other   . Cancer Other        maternal great grandfather (MGM's father); NOS cancer  . Breast cancer Other        maternal great grandmother (MGF's mother)  . Colon cancer Other        maternal great grandfather (MGF's father)  . Heart attack Other   . Colon cancer Other        paternal great grandfather (PGF's father)  . Heart attack Other     ROS: Review of Systems  Constitutional: Positive for malaise/fatigue and weight loss.  Gastrointestinal: Negative for nausea and vomiting.  Genitourinary: Negative for frequency.  Musculoskeletal:       Negative for muscle weakness  Endo/Heme/Allergies: Negative for polydipsia.       Positive for polyphagia    PHYSICAL EXAM: Blood pressure 117/75, pulse 95, temperature 98.2 F (36.8 C), temperature source Oral, height 5' (1.524 m), weight 209 lb (94.8 kg), last menstrual period 09/26/2015, SpO2 97 %. Body mass index is 40.82 kg/m. Physical Exam  Constitutional: She is oriented to person, place, and time. She appears well-developed and well-nourished.  Cardiovascular:  Normal rate.  Pulmonary/Chest: Effort normal.  Musculoskeletal: Normal range of motion.  Neurological: She is oriented to person, place, and time.  Skin: Skin is warm and dry.  Psychiatric: She has a normal mood and affect. Her behavior is normal.  Vitals reviewed.   RECENT LABS AND TESTS: BMET    Component Value Date/Time   NA 139 11/30/2017 0934   NA 139 02/19/2015 0911   K 4.6 11/30/2017 0934   K 4.0 02/19/2015 0911   CL 102 11/30/2017 0934   CO2 23 11/30/2017 0934   CO2 23 02/19/2015 0911   GLUCOSE 88  11/30/2017 0934   GLUCOSE 78 03/24/2016 1433   GLUCOSE 96 02/19/2015 0911   BUN 9 11/30/2017 0934   BUN 11.5 02/19/2015 0911   CREATININE 0.74 11/30/2017 0934   CREATININE 0.8 02/19/2015 0911   CALCIUM 9.0 11/30/2017 0934   CALCIUM 9.6 02/19/2015 0911   GFRNONAA 97 11/30/2017 0934   GFRAA 112 11/30/2017 0934   Lab Results  Component Value Date   HGBA1C 5.1 11/30/2017   Lab Results  Component Value Date   INSULIN 21.5 11/30/2017   CBC    Component Value Date/Time   WBC 7.9 11/30/2017 0934   WBC 8.9 03/29/2017 1428   RBC 4.68 11/30/2017 0934   RBC 4.41 03/29/2017 1428   HGB 13.5 11/30/2017 0934   HGB 12.8 02/19/2015 0911   HCT 41.6 11/30/2017 0934   HCT 38.3 02/19/2015 0911   PLT 399 (H) 11/30/2017 0934   MCV 89 11/30/2017 0934   MCV 83.6 02/19/2015 0911   MCH 28.8 11/30/2017 0934   MCH 28.7 03/17/2016 1048   MCHC 32.5 11/30/2017 0934   MCHC 33.5 03/29/2017 1428   RDW 13.5 11/30/2017 0934   RDW 17.0 (H) 02/19/2015 0911   LYMPHSABS 1.8 11/30/2017 0934   LYMPHSABS 2.0 02/19/2015 0911   MONOABS 0.6 03/29/2017 1428   MONOABS 0.7 02/19/2015 0911   EOSABS 0.0 11/30/2017 0934   BASOSABS 0.0 11/30/2017 0934   BASOSABS 0.1 02/19/2015 0911   Iron/TIBC/Ferritin/ %Sat    Component Value Date/Time   IRON 50 02/19/2015 0911   TIBC 359 02/19/2015 0911   FERRITIN 27 02/19/2015 0911   IRONPCTSAT 14 (L) 02/19/2015 0911   Lipid Panel     Component Value  Date/Time   CHOL 210 (H) 11/30/2017 0934   TRIG 98 11/30/2017 0934   HDL 63 11/30/2017 0934   CHOLHDL 3.3 11/30/2017 0934   CHOLHDL 3 02/23/2015 1018   VLDL 30.2 02/23/2015 1018   LDLCALC 127 (H) 11/30/2017 0934   Hepatic Function Panel     Component Value Date/Time   PROT 6.6 11/30/2017 0934   PROT 6.9 02/19/2015 0911   ALBUMIN 4.2 11/30/2017 0934   ALBUMIN 3.5 02/19/2015 0911   AST 16 11/30/2017 0934   AST 13 02/19/2015 0911   ALT 16 11/30/2017 0934   ALT 14 02/19/2015 0911   ALKPHOS 95 11/30/2017 0934   ALKPHOS 84 02/19/2015 0911   BILITOT 0.2 11/30/2017 0934   BILITOT 0.23 02/19/2015 0911      Component Value Date/Time   TSH 4.75 (H) 11/08/2017 0854   TSH 4.30 06/15/2017 0940   TSH 3.36 05/04/2017 0936   Results for SHAYLA, HEMING (MRN [id number]) as of 12/19/2017 13:47  Ref. Range 11/30/2017 09:34  Vitamin D, 25-Hydroxy Latest Ref Range: 30.0 - 100.0 ng/mL 19.7 (L)   ASSESSMENT AND PLAN: Vitamin D deficiency  Insulin resistance  At risk for diabetes mellitus  Class 3 severe obesity with serious comorbidity and body mass index (BMI) of 40.0 to 44.9 in adult, unspecified obesity type (Maria [name])  PLAN:  Vitamin D Deficiency (new) Maria [name] was informed that low vitamin D levels contributes to fatigue and are associated with obesity, breast, and colon cancer. She agrees to start to take prescription Vit D @50 ,000 IU every week #4 with no refills and will follow up for routine testing of vitamin D, at least 2-3 times per year. She was informed of the risk of over-replacement of vitamin D and agrees to not increase her dose unless she discusses this with [name] first.  Maria [name] agrees to follow up with our clinic in 2 weeks.  Insulin Resistance (new) Maria [name] will continue to work on weight loss, exercise, and decreasing simple carbohydrates in her diet to help decrease the risk of diabetes. We dicussed metformin including benefits and risks. She was informed that eating too many simple  carbohydrates or too many calories at one sitting increases the likelihood of GI side effects. Maria [name] agrees to start metformin for now and prescription was written today for metformin 500 mg qAM #30 with no refills. Maria [name] agreed to follow up with [name] as directed to monitor her progress.  Diabetes risk counseling Maria [name] was given extended (30 minutes) diabetes prevention counseling today. She is 47 y.o. female and has risk factors for diabetes including obesity and insulin resistance. We discussed intensive lifestyle modifications today with an emphasis on weight loss as well as increasing exercise and decreasing simple carbohydrates in her diet.  Obesity Maria [name] is currently in the action stage of change. As such, her goal is to continue with weight loss efforts She has agreed to follow the Category 2 plan +100 calories with breakfast options Maria [name] has been instructed to work up to a goal of 150 minutes of combined cardio and strengthening exercise per week for weight loss and overall health benefits. We discussed the following Behavioral Modification Strategies today: increasing lean protein intake, decreasing simple carbohydrates  and work on meal planning and easy cooking plans  Maria [name] has agreed to follow up with our clinic in 2 weeks. She was informed of the importance of frequent follow up visits to maximize her success with intensive lifestyle modifications for her multiple health conditions.   OBESITY BEHAVIORAL INTERVENTION VISIT  Today's visit was # 2 out of 22.  Starting weight: 212 lbs Starting date: 11/30/17 Today's weight : 209 lbs Today's date: 12/14/2017 Total lbs lost to date: 3 (Patients must lose 7 lbs in the first 6 months to continue with counseling)   ASK: We discussed the diagnosis of obesity with Maria [name] today and Maria [name] agreed to give [name] permission to discuss obesity behavioral modification therapy today.  ASSESS: Maria [name] has the diagnosis of obesity and  her BMI today is 45.82 Maria [name] is in the action stage of change   ADVISE: Maria [name] was educated on the multiple health risks of obesity as well as the benefit of weight loss to improve her health. She was advised of the need for long term treatment and the importance of lifestyle modifications.  AGREE: Multiple dietary modification options and treatment options were discussed and  Aubriella agreed to the above obesity treatment plan.  I, Doreene Nest, am acting as transcriptionist for Dennard Nip, MD  I have reviewed the above documentation for accuracy and completeness, and I agree with the above. -Dennard Nip, MD

## 2017-12-29 ENCOUNTER — Encounter (INDEPENDENT_AMBULATORY_CARE_PROVIDER_SITE_OTHER): Payer: Self-pay | Admitting: Family Medicine

## 2018-01-01 ENCOUNTER — Ambulatory Visit (INDEPENDENT_AMBULATORY_CARE_PROVIDER_SITE_OTHER): Payer: No Typology Code available for payment source | Admitting: Family Medicine

## 2018-01-01 ENCOUNTER — Encounter (INDEPENDENT_AMBULATORY_CARE_PROVIDER_SITE_OTHER): Payer: Self-pay

## 2018-01-01 NOTE — Telephone Encounter (Signed)
Please reschedule

## 2018-01-02 ENCOUNTER — Other Ambulatory Visit: Payer: Self-pay | Admitting: Family Medicine

## 2018-01-02 ENCOUNTER — Ambulatory Visit (INDEPENDENT_AMBULATORY_CARE_PROVIDER_SITE_OTHER): Payer: No Typology Code available for payment source | Admitting: Family Medicine

## 2018-01-02 VITALS — BP 129/83 | HR 82 | Temp 97.9°F | Ht 60.0 in | Wt 205.0 lb

## 2018-01-02 DIAGNOSIS — E559 Vitamin D deficiency, unspecified: Secondary | ICD-10-CM

## 2018-01-02 DIAGNOSIS — Z9189 Other specified personal risk factors, not elsewhere classified: Secondary | ICD-10-CM

## 2018-01-02 DIAGNOSIS — E8881 Metabolic syndrome: Secondary | ICD-10-CM | POA: Diagnosis not present

## 2018-01-02 DIAGNOSIS — Z6841 Body Mass Index (BMI) 40.0 and over, adult: Secondary | ICD-10-CM

## 2018-01-02 MED ORDER — VITAMIN D (ERGOCALCIFEROL) 1.25 MG (50000 UNIT) PO CAPS: 50000.0000 [IU] | ORAL_CAPSULE | ORAL | 0 refills | Status: DC

## 2018-01-02 MED ORDER — METFORMIN HCL 500 MG PO TABS: 500.0000 mg | ORAL_TABLET | Freq: Every day | ORAL | 0 refills | Status: DC

## 2018-01-03 NOTE — Progress Notes (Signed)
Office: [phone]  /  Fax: [phone]   HPI:   Chief Complaint: OBESITY Maria [name] is here to discuss her progress with her obesity treatment plan. She is on the Category 2 plan +100 calories and is following her eating plan approximately 95 % of the time. She states she is exercising 0 minutes 0 times per week. Maria [name] continues to do well with weight loss, but she doesn't like her breakfast options and states she is choking down her yogurt. Her weight is 205 lb (93 kg) today and has had a weight loss of 4 pounds over a period of 2 to 3 weeks since her last visit. She has lost 7 lbs since starting treatment with [name].  Vitamin D deficiency Maria [name] has a diagnosis of vitamin D deficiency. Maria [name] is stable on vit D. She is not yet at goal and denies nausea, vomiting or muscle weakness.  Insulin Resistance Maria [name] has a diagnosis of insulin resistance based on her elevated fasting insulin level >5. Although Maria [name]'s blood glucose readings are still under good control, insulin resistance puts her at greater risk of metabolic syndrome and diabetes. She notes GI upset with metformin and is not improving. She is doing well with her diet prescription. Maria [name] continues to work on diet and exercise to decrease risk of diabetes.  At risk for diabetes Maria [name] is at higher than average risk for developing diabetes due to her obesity and insulin resistance. She currently denies polyuria or polydipsia.  ALLERGIES: No Known Allergies  MEDICATIONS: Current Outpatient Medications on File Prior to Visit  Medication Sig Dispense Refill  . albuterol (PROVENTIL HFA;VENTOLIN HFA) 108 (90 Base) MCG/ACT inhaler Inhale 1 puff into the lungs every 6 (six) hours as needed for wheezing or shortness of breath. 3 Inhaler 0  . levothyroxine (SYNTHROID, LEVOTHROID) 75 MCG tablet Take 1 tablet (75 mcg total) by mouth daily. 90 tablet 0  . liothyronine (CYTOMEL) 5 MCG tablet Take 2 tabs with levothyroxine in the morning 180  tablet 2  . loratadine (CLARITIN) 10 MG tablet Take 1 tablet (10 mg total) by mouth daily. 90 tablet 0   No current facility-administered medications on file prior to visit.     PAST MEDICAL HISTORY: Past Medical History:  Diagnosis Date  . Abnormal mammogram of left breast 01/22/2016  . Anemia 11/21/2014   -with elevated platelets   . Anxiety   . Asthma   . Breast cyst 11/21/2014   -L breast, upper L just off from center -stable per radiology 10/2014   . Constipation   . Dyspnea   . Fatty liver   . Gallbladder problem   . Generalized anxiety disorder 11/21/2014  . GERD (gastroesophageal reflux disease)   . Hypothyroidism   . IBS (irritable bowel syndrome)   . Menorrhagia 11/21/2014   -seeing gyn    . Obesity 11/21/2014  . Plantar fasciitis of left foot 11/21/2014  . Seasonal allergies 11/21/2014  . Thyroid disease     PAST SURGICAL HISTORY: Past Surgical History:  Procedure Laterality Date  . ABDOMINAL HYSTERECTOMY    . BREAST BIOPSY    . BREAST EXCISIONAL BIOPSY    . BREAST LUMPECTOMY WITH RADIOACTIVE SEED LOCALIZATION Left 01/22/2016   Procedure: LEFT BREAST LUMPECTOMY WITH RADIOACTIVE SEED LOCALIZATION;  Surgeon: Fanny Skates, MD;  Location: [facility];  Service: General;  Laterality: Left;  . CESAREAN SECTION     x3  . CHOLECYSTECTOMY N/A 03/15/2016   Procedure: LAPAROSCOPIC CHOLECYSTECTOMY WITH INTRAOPERATIVE CHOLANGIOGRAM;  Surgeon: Sherren Mocha  Zella Richer, MD;  Location: [facility];  Service: General;  Laterality: N/A;  . CYSTO N/A 10/06/2015   Procedure: Consuela Mimes;  Surgeon: Nunzio Cobbs, MD;  Location: [facility];  Service: Gynecology;  Laterality: N/A;  . TUBAL LIGATION      SOCIAL HISTORY: Social History   Tobacco Use  . Smoking status: Former Smoker    Types: Cigarettes  . Smokeless tobacco: Never Used  Substance Use Topics  . Alcohol use: Yes    Alcohol/week: 0.6 oz    Types: 1 Glasses of wine per week    Comment: socially  . Drug use: No     FAMILY HISTORY: Family History  Problem Relation Age of Onset  . Breast cancer Mother 40       s/p lump and rad  . Skin cancer Mother        non-melanoma  . Thyroid disease Mother   . Skin cancer Father        non-melanoma  . Hypertension Father   . Hyperlipidemia Father   . Breast cancer Paternal Grandmother        dx 77s; s/p mastectomy  . Cervical cancer Maternal Grandmother   . Arthritis Maternal Grandmother   . CVA Maternal Grandmother   . Stroke Maternal Grandmother 80  . Heart attack Maternal Grandfather        d. 42s  . Colon cancer Paternal Grandfather 53  . Rectal cancer Maternal Uncle 54  . Aortic aneurysm Maternal Uncle 53  . Uterine cancer Other        maternal great grandmother (MGM's mother)  . Stroke Other   . Cancer Other        maternal great grandfather (MGM's father); NOS cancer  . Breast cancer Other        maternal great grandmother (MGF's mother)  . Colon cancer Other        maternal great grandfather (MGF's father)  . Heart attack Other   . Colon cancer Other        paternal great grandfather (PGF's father)  . Heart attack Other     ROS: Review of Systems  Constitutional: Positive for weight loss.  Gastrointestinal: Negative for nausea and vomiting.  Genitourinary: Negative for frequency.  Musculoskeletal:       Negative for muscle weakness  Endo/Heme/Allergies: Negative for polydipsia.    PHYSICAL EXAM: Blood pressure 129/83, pulse 82, temperature 97.9 F (36.6 C), temperature source Oral, height 5' (1.524 m), weight 205 lb (93 kg), last menstrual period 09/26/2015, SpO2 98 %. Body mass index is 40.04 kg/m. Physical Exam  Constitutional: She is oriented to person, place, and time. She appears well-developed and well-nourished.  Cardiovascular: Normal rate.  Pulmonary/Chest: Effort normal.  Musculoskeletal: Normal range of motion.  Neurological: She is oriented to person, place, and time.  Skin: Skin is warm and dry.   Psychiatric: She has a normal mood and affect. Her behavior is normal.  Vitals reviewed.   RECENT LABS AND TESTS: BMET    Component Value Date/Time   NA 139 11/30/2017 0934   NA 139 02/19/2015 0911   K 4.6 11/30/2017 0934   K 4.0 02/19/2015 0911   CL 102 11/30/2017 0934   CO2 23 11/30/2017 0934   CO2 23 02/19/2015 0911   GLUCOSE 88 11/30/2017 0934   GLUCOSE 78 03/24/2016 1433   GLUCOSE 96 02/19/2015 0911   BUN 9 11/30/2017 0934   BUN 11.5 02/19/2015 0911   CREATININE 0.74 11/30/2017 0934  CREATININE 0.8 02/19/2015 0911   CALCIUM 9.0 11/30/2017 0934   CALCIUM 9.6 02/19/2015 0911   GFRNONAA 97 11/30/2017 0934   GFRAA 112 11/30/2017 0934   Lab Results  Component Value Date   HGBA1C 5.1 11/30/2017   Lab Results  Component Value Date   INSULIN 21.5 11/30/2017   CBC    Component Value Date/Time   WBC 7.9 11/30/2017 0934   WBC 8.9 03/29/2017 1428   RBC 4.68 11/30/2017 0934   RBC 4.41 03/29/2017 1428   HGB 13.5 11/30/2017 0934   HGB 12.8 02/19/2015 0911   HCT 41.6 11/30/2017 0934   HCT 38.3 02/19/2015 0911   PLT 399 (H) 11/30/2017 0934   MCV 89 11/30/2017 0934   MCV 83.6 02/19/2015 0911   MCH 28.8 11/30/2017 0934   MCH 28.7 03/17/2016 1048   MCHC 32.5 11/30/2017 0934   MCHC 33.5 03/29/2017 1428   RDW 13.5 11/30/2017 0934   RDW 17.0 (H) 02/19/2015 0911   LYMPHSABS 1.8 11/30/2017 0934   LYMPHSABS 2.0 02/19/2015 0911   MONOABS 0.6 03/29/2017 1428   MONOABS 0.7 02/19/2015 0911   EOSABS 0.0 11/30/2017 0934   BASOSABS 0.0 11/30/2017 0934   BASOSABS 0.1 02/19/2015 0911   Iron/TIBC/Ferritin/ %Sat    Component Value Date/Time   IRON 50 02/19/2015 0911   TIBC 359 02/19/2015 0911   FERRITIN 27 02/19/2015 0911   IRONPCTSAT 14 (L) 02/19/2015 0911   Lipid Panel     Component Value Date/Time   CHOL 210 (H) 11/30/2017 0934   TRIG 98 11/30/2017 0934   HDL 63 11/30/2017 0934   CHOLHDL 3.3 11/30/2017 0934   CHOLHDL 3 02/23/2015 1018   VLDL 30.2 02/23/2015 1018    LDLCALC 127 (H) 11/30/2017 0934   Hepatic Function Panel     Component Value Date/Time   PROT 6.6 11/30/2017 0934   PROT 6.9 02/19/2015 0911   ALBUMIN 4.2 11/30/2017 0934   ALBUMIN 3.5 02/19/2015 0911   AST 16 11/30/2017 0934   AST 13 02/19/2015 0911   ALT 16 11/30/2017 0934   ALT 14 02/19/2015 0911   ALKPHOS 95 11/30/2017 0934   ALKPHOS 84 02/19/2015 0911   BILITOT 0.2 11/30/2017 0934   BILITOT 0.23 02/19/2015 0911      Component Value Date/Time   TSH 4.75 (H) 11/08/2017 0854   TSH 4.30 06/15/2017 0940   TSH 3.36 05/04/2017 0936   Results for ALLAYAH, RAINERI (MRN [id number]) as of 01/03/2018 14:37  Ref. Range 11/30/2017 09:34  Vitamin D, 25-Hydroxy Latest Ref Range: 30.0 - 100.0 ng/mL 19.7 (L)   ASSESSMENT AND PLAN: Insulin resistance - Plan: metFORMIN (GLUCOPHAGE) 500 MG tablet  Vitamin D deficiency - Plan: Vitamin D, Ergocalciferol, (DRISDOL) 50000 units CAPS capsule  At risk for diabetes mellitus  Class 3 severe obesity with serious comorbidity and body mass index (BMI) of 40.0 to 44.9 in adult, unspecified obesity type ([place])  PLAN:  Vitamin D Deficiency Maria [name] was informed that low vitamin D levels contributes to fatigue and are associated with obesity, breast, and colon cancer. She agrees to continue to take prescription Vit D @50 ,000 IU every week #4 with no refills and will follow up for routine testing of vitamin D, at least 2-3 times per year. She was informed of the risk of over-replacement of vitamin D and agrees to not increase her dose unless she discusses this with [name] first. Maria [name] agrees to follow up as directed.  Insulin Resistance Maria [name] will continue to work on weight loss,  exercise, and decreasing simple carbohydrates in her diet to help decrease the risk of diabetes. We dicussed metformin including benefits and risks. She was informed that eating too many simple carbohydrates or too many calories at one sitting increases the likelihood of GI side  effects. Maria [name] agreed to continue metformin 500 mg daily #30 with no refills (okay to take half tablet by mouth qAM for now). Maria [name] agreed to follow up with [name] as directed to monitor her progress.  Diabetes risk counseling Maria [name] was given extended (15 minutes) diabetes prevention counseling today. She is 47 y.o. female and has risk factors for diabetes including obesity and insulin resistance. We discussed intensive lifestyle modifications today with an emphasis on weight loss as well as increasing exercise and decreasing simple carbohydrates in her diet.  Obesity Maria [name] is currently in the action stage of change. As such, her goal is to continue with weight loss efforts She has agreed to change to keeping a food journal with 200 to 400 calories and 25 grams of protein at breakfast daily and the Category 2 plan Maria [name] has been instructed to work up to a goal of 150 minutes of combined cardio and strengthening exercise per week for weight loss and overall health benefits. We discussed the following Behavioral Modification Strategies today: no skipping meals, increasing lean protein intake and decreasing simple carbohydrates   Maria [name] has agreed to follow up with our clinic in 2 to 3 weeks. She was informed of the importance of frequent follow up visits to maximize her success with intensive lifestyle modifications for her multiple health conditions.   OBESITY BEHAVIORAL INTERVENTION VISIT  Today's visit was # 3 out of 22.  Starting weight: 212 lbs Starting date: 11/30/17 Today's weight : 205 lbs Today's date: 01/02/2018 Total lbs lost to date: 7 (Patients must lose 7 lbs in the first 6 months to continue with counseling)   ASK: We discussed the diagnosis of obesity with Maria [name] today and Maria [name] agreed to give [name] permission to discuss obesity behavioral modification therapy today.  ASSESS: Maria [name] has the diagnosis of obesity and her BMI today is 40.04 Maria [name] is in the action  stage of change   ADVISE: Maria [name] was educated on the multiple health risks of obesity as well as the benefit of weight loss to improve her health. She was advised of the need for long term treatment and the importance of lifestyle modifications.  AGREE: Multiple dietary modification options and treatment options were discussed and  Herta agreed to the above obesity treatment plan.  I, Doreene Nest, am acting as transcriptionist for Dennard Nip, MD  I have reviewed the above documentation for accuracy and completeness, and I agree with the above. -Dennard Nip, MD

## 2018-01-10 ENCOUNTER — Other Ambulatory Visit: Payer: No Typology Code available for payment source

## 2018-01-15 ENCOUNTER — Ambulatory Visit: Payer: No Typology Code available for payment source | Admitting: Endocrinology

## 2018-01-18 ENCOUNTER — Ambulatory Visit: Payer: No Typology Code available for payment source | Admitting: Family Medicine

## 2018-01-19 ENCOUNTER — Encounter (INDEPENDENT_AMBULATORY_CARE_PROVIDER_SITE_OTHER): Payer: Self-pay | Admitting: Family Medicine

## 2018-01-22 ENCOUNTER — Ambulatory Visit: Payer: No Typology Code available for payment source | Admitting: Obstetrics and Gynecology

## 2018-01-23 ENCOUNTER — Ambulatory Visit (INDEPENDENT_AMBULATORY_CARE_PROVIDER_SITE_OTHER): Payer: No Typology Code available for payment source | Admitting: Family Medicine

## 2018-01-23 ENCOUNTER — Encounter (INDEPENDENT_AMBULATORY_CARE_PROVIDER_SITE_OTHER): Payer: Self-pay

## 2018-01-29 ENCOUNTER — Ambulatory Visit
Admission: RE | Admit: 2018-01-29 | Discharge: 2018-01-29 | Disposition: A | Discharge status: Home / Self Care | Payer: No Typology Code available for payment source | Source: Ambulatory Visit | Attending: Obstetrics and Gynecology | Admitting: Obstetrics and Gynecology

## 2018-01-29 DIAGNOSIS — Z1231 Encounter for screening mammogram for malignant neoplasm of breast: Secondary | ICD-10-CM

## 2018-02-01 ENCOUNTER — Ambulatory Visit (INDEPENDENT_AMBULATORY_CARE_PROVIDER_SITE_OTHER): Payer: No Typology Code available for payment source | Admitting: Family Medicine

## 2018-02-01 VITALS — BP 115/77 | HR 75 | Temp 98.2°F | Ht 60.0 in | Wt 205.0 lb

## 2018-02-01 DIAGNOSIS — E559 Vitamin D deficiency, unspecified: Secondary | ICD-10-CM

## 2018-02-01 DIAGNOSIS — Z9189 Other specified personal risk factors, not elsewhere classified: Secondary | ICD-10-CM | POA: Diagnosis not present

## 2018-02-01 DIAGNOSIS — E8881 Metabolic syndrome: Secondary | ICD-10-CM | POA: Diagnosis not present

## 2018-02-01 DIAGNOSIS — Z6841 Body Mass Index (BMI) 40.0 and over, adult: Secondary | ICD-10-CM | POA: Diagnosis not present

## 2018-02-01 MED ORDER — METFORMIN HCL 500 MG PO TABS: 500.0000 mg | ORAL_TABLET | Freq: Every day | ORAL | 0 refills | Status: DC

## 2018-02-01 MED ORDER — VITAMIN D (ERGOCALCIFEROL) 1.25 MG (50000 UNIT) PO CAPS: 50000.0000 [IU] | ORAL_CAPSULE | ORAL | 0 refills | Status: DC

## 2018-02-01 NOTE — Progress Notes (Signed)
Office: [phone]  /  Fax: [phone]   HPI:   Chief Complaint: OBESITY Maria [name] is here to discuss her progress with her obesity treatment plan. She is on the keep a food journal with 200-400 calories and 25 grams of protein at breakfast daily and follow the Category 2 plan and is following her eating plan approximately 50 % of the time. She states she is exercising 0 minutes 0 times per week. Maria [name] did well with maintaining weight but is deviating from her plan more. She would like to get back on track.  Her weight is 205 lb (93 kg) today and has not lost weight since her last visit. She has lost 7 lbs since starting treatment with [name].  Pre-Diabetes Maria [name] has a diagnosis of pre-diabetes based on her elevated Hgb A1c and was informed this puts her at greater risk of developing diabetes. She is stable on metformin, she denies nausea, vomiting, or hypoglycemia, struggling with diet and weight loss.  At risk for diabetes Maria [name] is at higher than average risk for developing diabetes due to her obesity and pre-diabetes. She currently denies polyuria or polydipsia.  Vitamin D Deficiency Maria [name] has a diagnosis of vitamin D deficiency. She is stable on prescription Vit D, not yet at goal. She denies nausea, vomiting or muscle weakness.  ALLERGIES: No Known Allergies  MEDICATIONS: Current Outpatient Medications on File Prior to Visit  Medication Sig Dispense Refill  . albuterol (PROVENTIL HFA;VENTOLIN HFA) 108 (90 Base) MCG/ACT inhaler Inhale 1 puff into the lungs every 6 (six) hours as needed for wheezing or shortness of breath. 3 Inhaler 0  . levothyroxine (SYNTHROID, LEVOTHROID) 75 MCG tablet Take 1 tablet (75 mcg total) by mouth daily. 90 tablet 0  . liothyronine (CYTOMEL) 5 MCG tablet Take 2 tabs with levothyroxine in the morning 180 tablet 2  . loratadine (CLARITIN) 10 MG tablet Take 1 tablet (10 mg total) by mouth daily. 90 tablet 0  . metFORMIN (GLUCOPHAGE) 500 MG tablet Take 1  tablet (500 mg total) by mouth daily with breakfast. 30 tablet 0  . venlafaxine XR (EFFEXOR-XR) 150 MG 24 hr capsule TAKE 1 CAPSULE BY MOUTH DAILY WITH BREAKFAST 30 capsule 0  . Vitamin D, Ergocalciferol, (DRISDOL) 50000 units CAPS capsule Take 1 capsule (50,000 Units total) by mouth every 7 (seven) days. 4 capsule 0   No current facility-administered medications on file prior to visit.     PAST MEDICAL HISTORY: Past Medical History:  Diagnosis Date  . Abnormal mammogram of left breast 01/22/2016  . Anemia 11/21/2014   -with elevated platelets   . Anxiety   . Asthma   . Breast cyst 11/21/2014   -L breast, upper L just off from center -stable per radiology 10/2014   . Constipation   . Dyspnea   . Fatty liver   . Gallbladder problem   . Generalized anxiety disorder 11/21/2014  . GERD (gastroesophageal reflux disease)   . Hypothyroidism   . IBS (irritable bowel syndrome)   . Menorrhagia 11/21/2014   -seeing gyn    . Obesity 11/21/2014  . Plantar fasciitis of left foot 11/21/2014  . Seasonal allergies 11/21/2014  . Thyroid disease     PAST SURGICAL HISTORY: Past Surgical History:  Procedure Laterality Date  . ABDOMINAL HYSTERECTOMY    . BREAST BIOPSY    . BREAST EXCISIONAL BIOPSY    . BREAST LUMPECTOMY WITH RADIOACTIVE SEED LOCALIZATION Left 01/22/2016   Procedure: LEFT BREAST LUMPECTOMY WITH RADIOACTIVE SEED LOCALIZATION;  Surgeon:  Fanny Skates, MD;  Location: [place];  Service: General;  Laterality: Left;  . CESAREAN SECTION     x3  . CHOLECYSTECTOMY N/A 03/15/2016   Procedure: LAPAROSCOPIC CHOLECYSTECTOMY WITH INTRAOPERATIVE CHOLANGIOGRAM;  Surgeon: Jackolyn Confer, MD;  Location: [facility];  Service: General;  Laterality: N/A;  . CYSTO N/A 10/06/2015   Procedure: Consuela Mimes;  Surgeon: Nunzio Cobbs, MD;  Location: [facility];  Service: Gynecology;  Laterality: N/A;  . TUBAL LIGATION      SOCIAL HISTORY: Social History   Tobacco Use  . Smoking status: Former  Smoker    Types: Cigarettes  . Smokeless tobacco: Never Used  Substance Use Topics  . Alcohol use: Yes    Alcohol/week: 0.6 oz    Types: 1 Glasses of wine per week    Comment: socially  . Drug use: No    FAMILY HISTORY: Family History  Problem Relation Age of Onset  . Breast cancer Mother 65       s/p lump and rad  . Skin cancer Mother        non-melanoma  . Thyroid disease Mother   . Skin cancer Father        non-melanoma  . Hypertension Father   . Hyperlipidemia Father   . Breast cancer Paternal Grandmother        dx 27s; s/p mastectomy  . Cervical cancer Maternal Grandmother   . Arthritis Maternal Grandmother   . CVA Maternal Grandmother   . Stroke Maternal Grandmother 80  . Heart attack Maternal Grandfather        d. 26s  . Colon cancer Paternal Grandfather 56  . Rectal cancer Maternal Uncle 54  . Aortic aneurysm Maternal Uncle 53  . Uterine cancer Other        maternal great grandmother (MGM's mother)  . Stroke Other   . Cancer Other        maternal great grandfather (MGM's father); NOS cancer  . Breast cancer Other        maternal great grandmother (MGF's mother)  . Colon cancer Other        maternal great grandfather (MGF's father)  . Heart attack Other   . Colon cancer Other        paternal great grandfather (PGF's father)  . Heart attack Other     ROS: Review of Systems  Constitutional: Negative for weight loss.  Gastrointestinal: Negative for nausea and vomiting.  Genitourinary: Negative for frequency.  Musculoskeletal:       Negative muscle weakness  Endo/Heme/Allergies: Negative for polydipsia.       Negative hypoglycemia    PHYSICAL EXAM: Blood pressure 115/77, pulse 75, temperature 98.2 F (36.8 C), temperature source Oral, height 5' (1.524 m), weight 205 lb (93 kg), last menstrual period 09/26/2015, SpO2 96 %. Body mass index is 40.04 kg/m. Physical Exam  Constitutional: She is oriented to person, place, and time. She appears  well-developed and well-nourished.  Cardiovascular: Normal rate.  Pulmonary/Chest: Effort normal.  Musculoskeletal: Normal range of motion.  Neurological: She is oriented to person, place, and time.  Skin: Skin is warm and dry.  Psychiatric: She has a normal mood and affect. Her behavior is normal.  Vitals reviewed.   RECENT LABS AND TESTS: BMET    Component Value Date/Time   NA 139 11/30/2017 0934   NA 139 02/19/2015 0911   K 4.6 11/30/2017 0934   K 4.0 02/19/2015 0911   CL 102 11/30/2017 0934  CO2 23 11/30/2017 0934   CO2 23 02/19/2015 0911   GLUCOSE 88 11/30/2017 0934   GLUCOSE 78 03/24/2016 1433   GLUCOSE 96 02/19/2015 0911   BUN 9 11/30/2017 0934   BUN 11.5 02/19/2015 0911   CREATININE 0.74 11/30/2017 0934   CREATININE 0.8 02/19/2015 0911   CALCIUM 9.0 11/30/2017 0934   CALCIUM 9.6 02/19/2015 0911   GFRNONAA 97 11/30/2017 0934   GFRAA 112 11/30/2017 0934   Lab Results  Component Value Date   HGBA1C 5.1 11/30/2017   Lab Results  Component Value Date   INSULIN 21.5 11/30/2017   CBC    Component Value Date/Time   WBC 7.9 11/30/2017 0934   WBC 8.9 03/29/2017 1428   RBC 4.68 11/30/2017 0934   RBC 4.41 03/29/2017 1428   HGB 13.5 11/30/2017 0934   HGB 12.8 02/19/2015 0911   HCT 41.6 11/30/2017 0934   HCT 38.3 02/19/2015 0911   PLT 399 (H) 11/30/2017 0934   MCV 89 11/30/2017 0934   MCV 83.6 02/19/2015 0911   MCH 28.8 11/30/2017 0934   MCH 28.7 03/17/2016 1048   MCHC 32.5 11/30/2017 0934   MCHC 33.5 03/29/2017 1428   RDW 13.5 11/30/2017 0934   RDW 17.0 (H) 02/19/2015 0911   LYMPHSABS 1.8 11/30/2017 0934   LYMPHSABS 2.0 02/19/2015 0911   MONOABS 0.6 03/29/2017 1428   MONOABS 0.7 02/19/2015 0911   EOSABS 0.0 11/30/2017 0934   BASOSABS 0.0 11/30/2017 0934   BASOSABS 0.1 02/19/2015 0911   Iron/TIBC/Ferritin/ %Sat    Component Value Date/Time   IRON 50 02/19/2015 0911   TIBC 359 02/19/2015 0911   FERRITIN 27 02/19/2015 0911   IRONPCTSAT 14 (L)  02/19/2015 0911   Lipid Panel     Component Value Date/Time   CHOL 210 (H) 11/30/2017 0934   TRIG 98 11/30/2017 0934   HDL 63 11/30/2017 0934   CHOLHDL 3.3 11/30/2017 0934   CHOLHDL 3 02/23/2015 1018   VLDL 30.2 02/23/2015 1018   LDLCALC 127 (H) 11/30/2017 0934   Hepatic Function Panel     Component Value Date/Time   PROT 6.6 11/30/2017 0934   PROT 6.9 02/19/2015 0911   ALBUMIN 4.2 11/30/2017 0934   ALBUMIN 3.5 02/19/2015 0911   AST 16 11/30/2017 0934   AST 13 02/19/2015 0911   ALT 16 11/30/2017 0934   ALT 14 02/19/2015 0911   ALKPHOS 95 11/30/2017 0934   ALKPHOS 84 02/19/2015 0911   BILITOT 0.2 11/30/2017 0934   BILITOT 0.23 02/19/2015 0911      Component Value Date/Time   TSH 4.75 (H) 11/08/2017 0854   TSH 4.30 06/15/2017 0940   TSH 3.36 05/04/2017 0936  Results for LINDZY, RUPERT (MRN [id number]) as of 02/01/2018 10:22  Ref. Range 11/30/2017 09:34  Vitamin D, 25-Hydroxy Latest Ref Range: 30.0 - 100.0 ng/mL 19.7 (L)    ASSESSMENT AND PLAN: Insulin resistance - Plan: metFORMIN (GLUCOPHAGE) 500 MG tablet  Vitamin D deficiency - Plan: Vitamin D, Ergocalciferol, (DRISDOL) 50000 units CAPS capsule  At risk for diabetes mellitus  Class 3 severe obesity with serious comorbidity and body mass index (BMI) of 40.0 to 44.9 in adult, unspecified obesity type ([place])  PLAN:  Pre-Diabetes Maria [name] will continue to work on weight loss, exercise, and decreasing simple carbohydrates in her diet to help decrease the risk of diabetes. We dicussed metformin including benefits and risks. She was informed that eating too many simple carbohydrates or too many calories at one sitting increases the likelihood of  GI side effects. Maria [name] agrees to continue taking metformin 500 mg q AM #30 and we will refill for 1 month. Maria [name] agrees to follow up with our clinic in 2 to 3 weeks as directed to monitor her progress.  Diabetes risk counselling Maria [name] was given extended (15 minutes) diabetes  prevention counseling today. She is 47 y.o. female and has risk factors for diabetes including obesity and pre-diabetes. We discussed intensive lifestyle modifications today with an emphasis on weight loss as well as increasing exercise and decreasing simple carbohydrates in her diet.  Vitamin D Deficiency Maria [name] was informed that low vitamin D levels contributes to fatigue and are associated with obesity, breast, and colon cancer. Maria [name] agrees to continue taking prescription Vit D @50 ,000 IU every week #4 and we will refill for 1 month. She will follow up for routine testing of vitamin D, at least 2-3 times per year. She was informed of the risk of over-replacement of vitamin D and agrees to not increase her dose unless she discusses this with [name] first. Maria [name] agrees to follow up with our clinic in 2 to 3 weeks.  Obesity Maria [name] is currently in the action stage of change. As such, her goal is to continue with weight loss efforts She has agreed to change to keep a food journal with 400-550 calories and 35 grams of protein at supper daily and follow the Category 2 plan Maria [name] has been instructed to work up to a goal of 150 minutes of combined cardio and strengthening exercise per week for weight loss and overall health benefits. We discussed the following Behavioral Modification Strategies today: increasing lean protein intake, decreasing simple carbohydrates, work on meal planning and easy cooking plans, and travel eating strategies    Maria [name] has agreed to follow up with our clinic in 2 to 3 weeks. She was informed of the importance of frequent follow up visits to maximize her success with intensive lifestyle modifications for her multiple health conditions.   OBESITY BEHAVIORAL INTERVENTION VISIT  Today's visit was # 4 out of 22.  Starting weight: 212 lbs Starting date: 11/30/17 Today's weight : 205 lbs Today's date: 02/01/2018 Total lbs lost to date: 7 (Patients must lose 7 lbs in the first  6 months to continue with counseling)   ASK: We discussed the diagnosis of obesity with Maria [name] today and Maria [name] agreed to give [name] permission to discuss obesity behavioral modification therapy today.  ASSESS: Maria [name] has the diagnosis of obesity and her BMI today is 40.04 Maria [name] is in the action stage of change   ADVISE: Maria [name] was educated on the multiple health risks of obesity as well as the benefit of weight loss to improve her health. She was advised of the need for long term treatment and the importance of lifestyle modifications.  AGREE: Multiple dietary modification options and treatment options were discussed and  Maria [name] agreed to the above obesity treatment plan.  I, Trixie Dredge, am acting as transcriptionist for Dennard Nip, MD  I have reviewed the above documentation for accuracy and completeness, and I agree with the above. -Dennard Nip, MD

## 2018-02-06 ENCOUNTER — Other Ambulatory Visit: Payer: Self-pay | Admitting: Family Medicine

## 2018-02-06 ENCOUNTER — Encounter: Payer: Self-pay | Admitting: Family Medicine

## 2018-02-10 NOTE — Progress Notes (Signed)
HPI:  Using dictation device. Unfortunately this device frequently misinterprets words/phrases.  Maria [name] is a pleasant 47 y.o. here for follow up. Chronic medical problems summarized below were reviewed for changes and stability and were updated as needed below. These issues and their treatment remain stable for the most part.  Reports she is doing well and is without any complaints today.  She does need a refill on her Effexor.  Reports her mood has been great, with her anxiety well controlled on the Effexor.  She would like to continue this.  She is seeing Dr. Dwyane [name] for her thyroid and reports she has a visit with him coming up soon.  She reports she is seeing Dr. Leafy [name] for all of the other issues including her weight, insulin resistance, vitamin D deficiency and hyperlipidemia.  Reports sticking to the plan has been hard.  She has lost some weight. She reports her asthma has been well controlled and she has not needed to use her albuterol recently. Denies CP, SOB, DOE, treatment intolerance or new symptoms.  GAD: -meds: effexor  Seasonal allergies/mild int asthma: -meds: loratadine, prn alb  Obesity/Insulin resistance/Hyperlipidemia: -seeing Dr. Trixie [name] -meds: metformin  Hypothyroidism: -meds: cytomel, synthroid -sees Dr. Dwyane [name]  Low Vit D: -meds: supplemented D3  ROS: See pertinent positives and negatives per HPI.  Past Medical History:  Diagnosis Date  . Abnormal mammogram of left breast 01/22/2016  . Anemia 11/21/2014   -with elevated platelets   . Anxiety   . Asthma   . Breast cyst 11/21/2014   -L breast, upper L just off from center -stable per radiology 10/2014   . Constipation   . Dyspnea   . Fatty liver   . Gallbladder problem   . Gallstone pancreatitis 03/14/2016  . Generalized anxiety disorder 11/21/2014  . GERD (gastroesophageal reflux disease)   . Hypothyroidism   . IBS (irritable bowel syndrome)   . Menorrhagia 11/21/2014   -seeing gyn    . Obesity  11/21/2014  . Plantar fasciitis of left foot 11/21/2014  . Seasonal allergies 11/21/2014  . 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 Thyroid disease     Past Surgical History:  Procedure Laterality Date  . ABDOMINAL HYSTERECTOMY    . BREAST BIOPSY    . BREAST EXCISIONAL BIOPSY    . BREAST LUMPECTOMY WITH RADIOACTIVE SEED LOCALIZATION Left 01/22/2016   Procedure: LEFT BREAST LUMPECTOMY WITH RADIOACTIVE SEED LOCALIZATION;  Surgeon: Fanny Skates, MD;  Location: [place];  Service: General;  Laterality: Left;  . CESAREAN SECTION     x3  . CHOLECYSTECTOMY N/A 03/15/2016   Procedure: LAPAROSCOPIC CHOLECYSTECTOMY WITH INTRAOPERATIVE CHOLANGIOGRAM;  Surgeon: Jackolyn Confer, MD;  Location: [facility];  Service: General;  Laterality: N/A;  . CYSTO N/A 10/06/2015   Procedure: Consuela Mimes;  Surgeon: Nunzio Cobbs, MD;  Location: [facility];  Service: Gynecology;  Laterality: N/A;  . TUBAL LIGATION      Family History  Problem Relation Age of Onset  . Breast cancer Mother 30       s/p lump and rad  . Skin cancer Mother        non-melanoma  . Thyroid disease Mother   . Skin cancer Father        non-melanoma  . Hypertension Father   . Hyperlipidemia Father   . Breast cancer Paternal Grandmother        dx 13s; s/p mastectomy  . Cervical cancer Maternal Grandmother   .  Arthritis Maternal Grandmother   . CVA Maternal Grandmother   . Stroke Maternal Grandmother 80  . Heart attack Maternal Grandfather        d. 69s  . Colon cancer Paternal Grandfather 47  . Rectal cancer Maternal Uncle 54  . Aortic aneurysm Maternal Uncle 53  . Uterine cancer Other        maternal great grandmother (MGM's mother)  . Stroke Other   . Cancer Other        maternal great grandfather (MGM's father); NOS cancer  . Breast cancer Other        maternal great grandmother (MGF's mother)  . Colon cancer Other        maternal great grandfather (MGF's  father)  . Heart attack Other   . Colon cancer Other        paternal great grandfather (PGF's father)  . Heart attack Other     SOCIAL HX: See HPI   Current Outpatient Medications:  .  albuterol (PROVENTIL HFA;VENTOLIN HFA) 108 (90 Base) MCG/ACT inhaler, Inhale 1 puff into the lungs every 6 (six) hours as needed for wheezing or shortness of breath., Disp: 3 Inhaler, Rfl: 0 .  levothyroxine (SYNTHROID, LEVOTHROID) 75 MCG tablet, Take 1 tablet (75 mcg total) by mouth daily., Disp: 90 tablet, Rfl: 0 .  liothyronine (CYTOMEL) 5 MCG tablet, Take 2 tabs with levothyroxine in the morning, Disp: 180 tablet, Rfl: 2 .  loratadine (CLARITIN) 10 MG tablet, Take 1 tablet (10 mg total) by mouth daily., Disp: 90 tablet, Rfl: 0 .  metFORMIN (GLUCOPHAGE) 500 MG tablet, Take 1 tablet (500 mg total) by mouth daily with breakfast., Disp: 30 tablet, Rfl: 0 .  venlafaxine XR (EFFEXOR-XR) 150 MG 24 hr capsule, TAKE 1 CAPSULE BY MOUTH DAILY WITH BREAKFAST, Disp: 90 capsule, Rfl: 1 .  Vitamin D, Ergocalciferol, (DRISDOL) 50000 units CAPS capsule, Take 1 capsule (50,000 Units total) by mouth every 7 (seven) days., Disp: 4 capsule, Rfl: 0  EXAM:  Vitals:   02/12/18 1036  BP: 92/70  Pulse: 71  Temp: 97.7 F (36.5 C)    Body mass index is 40.61 kg/m.  GENERAL: vitals reviewed and listed above, alert, oriented, appears well hydrated and in no acute distress  HEENT: atraumatic, conjunttiva clear, no obvious abnormalities on inspection of external nose and ears  NECK: no obvious masses on inspection  LUNGS: clear to auscultation bilaterally, no wheezes, rales or rhonchi, good air movement  CV: HRRR, no peripheral edema  MS: moves all extremities without noticeable abnormality  PSYCH: pleasant and cooperative, no obvious depression or anxiety  ASSESSMENT AND PLAN:  Discussed the following assessment and plan:  Generalized anxiety disorder  Screening for depression  Other specified  hypothyroidism  Morbid obesity ([place])  -refills effexor sent -she reports she has follow up with Dr. Fredirick [name] planned -follow up 6 months -Patient advised to return or notify a doctor immediately if symptoms worsen or persist or new concerns arise.  Declined AVS  There are no Patient Instructions on file for this visit.  Lucretia Kern, DO

## 2018-02-12 ENCOUNTER — Encounter: Payer: Self-pay | Admitting: Family Medicine

## 2018-02-12 ENCOUNTER — Ambulatory Visit (INDEPENDENT_AMBULATORY_CARE_PROVIDER_SITE_OTHER): Payer: No Typology Code available for payment source | Admitting: Family Medicine

## 2018-02-12 VITALS — BP 92/70 | HR 71 | Temp 97.7°F | Ht 59.75 in | Wt 206.2 lb

## 2018-02-12 DIAGNOSIS — F411 Generalized anxiety disorder: Secondary | ICD-10-CM

## 2018-02-12 DIAGNOSIS — E038 Other specified hypothyroidism: Secondary | ICD-10-CM

## 2018-02-12 DIAGNOSIS — Z1331 Encounter for screening for depression: Secondary | ICD-10-CM

## 2018-02-12 MED ORDER — VENLAFAXINE HCL ER 150 MG PO CP24: ORAL_CAPSULE | ORAL | 1 refills | Status: DC

## 2018-02-22 ENCOUNTER — Other Ambulatory Visit: Payer: Self-pay | Admitting: Endocrinology

## 2018-02-26 ENCOUNTER — Ambulatory Visit: Payer: No Typology Code available for payment source | Admitting: Obstetrics and Gynecology

## 2018-02-26 ENCOUNTER — Ambulatory Visit (INDEPENDENT_AMBULATORY_CARE_PROVIDER_SITE_OTHER): Payer: No Typology Code available for payment source | Admitting: Family Medicine

## 2018-02-26 VITALS — BP 141/84 | HR 79 | Temp 97.7°F | Ht 60.0 in | Wt 206.0 lb

## 2018-02-26 DIAGNOSIS — R7303 Prediabetes: Secondary | ICD-10-CM

## 2018-02-26 DIAGNOSIS — F3289 Other specified depressive episodes: Secondary | ICD-10-CM

## 2018-02-26 DIAGNOSIS — Z6841 Body Mass Index (BMI) 40.0 and over, adult: Secondary | ICD-10-CM

## 2018-02-26 DIAGNOSIS — Z9189 Other specified personal risk factors, not elsewhere classified: Secondary | ICD-10-CM

## 2018-02-26 DIAGNOSIS — E559 Vitamin D deficiency, unspecified: Secondary | ICD-10-CM | POA: Diagnosis not present

## 2018-02-26 DIAGNOSIS — E66813 Obesity, class 3: Secondary | ICD-10-CM

## 2018-02-26 MED ORDER — METFORMIN HCL 500 MG PO TABS: 500.0000 mg | ORAL_TABLET | Freq: Every day | ORAL | 0 refills | Status: DC

## 2018-02-26 MED ORDER — TOPIRAMATE 50 MG PO TABS: 50.0000 mg | ORAL_TABLET | Freq: Every day | ORAL | 0 refills | Status: DC

## 2018-02-26 MED ORDER — VITAMIN D (ERGOCALCIFEROL) 1.25 MG (50000 UNIT) PO CAPS: 50000.0000 [IU] | ORAL_CAPSULE | ORAL | 0 refills | Status: DC

## 2018-02-26 NOTE — Progress Notes (Signed)
Office: [phone]  /  Fax: [phone]   HPI:   Chief Complaint: OBESITY Maria [name] is here to discuss her progress with her obesity treatment plan. She is on the keep a food journal with 400 to 550 calories and 35 grams of protein at supper daily and the Category 2 plan and is following her eating plan approximately 20 % of the time. She states she is exercising 0 minutes 0 times per week. [name] has struggled more with increased emotional eating this last month. She has had increased temptations at work and increased eating out. She is frustrated with herself for not doing as well as she had hoped. Her weight is 206 lb (93.4 kg) today and has had a weight gain of 1 pound over a period of 3 weeks since her last visit. She has lost 6 lbs since starting treatment with [name].  Vitamin D deficiency [name] has a diagnosis of vitamin D deficiency. She is stable on vit D, but she is not yet at goal. Dior denies nausea, vomiting or muscle weakness.  Pre-Diabetes [name] has a diagnosis of prediabetes based on her elevated Hgb A1c and was informed this puts her at greater risk of developing diabetes. She is stable on Metformin and she continues to work on diet and exercise to decrease risk of diabetes. Carries has decreased polyphagia and she denies nausea, vomiting or hypoglycemia.  Depression with emotional eating behaviors [name] is on Effexor and feels it helps with mood, but she is still using food for comfort and stress relief. She feels her emotional eating is out of control. [name] struggles with emotional eating and using food for comfort to the extent that it is negatively impacting her health. She often snacks when she is not hungry. [name] sometimes feels she is out of control and then feels guilty that she made poor food choices. She has been working on behavior modification techniques to help reduce her emotional eating and has been somewhat successful. She shows no sign of suicidal or  homicidal ideations.  At risk for cardiovascular disease [name] is at a higher than average risk for cardiovascular disease due to obesity. She currently denies any chest pain.  Depression screen [facility] 2/9 02/12/2018 11/30/2017  Decreased Interest 0 1  Down, Depressed, Hopeless 0 1  PHQ - 2 Score 0 2  Altered sleeping - 0  Tired, decreased energy - 1  Change in appetite - 1  Feeling bad or failure about yourself  - 0  Trouble concentrating - 0  Moving slowly or fidgety/restless - 0  Suicidal thoughts - 0  PHQ-9 Score - 4  Difficult doing work/chores - Not difficult at all      ALLERGIES: No Known Allergies  MEDICATIONS: Current Outpatient Medications on File Prior to Visit  Medication Sig Dispense Refill  . albuterol (PROVENTIL HFA;VENTOLIN HFA) 108 (90 Base) MCG/ACT inhaler Inhale 1 puff into the lungs every 6 (six) hours as needed for wheezing or shortness of breath. 3 Inhaler 0  . levothyroxine (SYNTHROID, LEVOTHROID) 75 MCG tablet TAKE 1 TABLET BY MOUTH DAILY. 90 tablet 0  . liothyronine (CYTOMEL) 5 MCG tablet Take 2 tabs with levothyroxine in the morning 180 tablet 2  . loratadine (CLARITIN) 10 MG tablet Take 1 tablet (10 mg total) by mouth daily. 90 tablet 0  . venlafaxine XR (EFFEXOR-XR) 150 MG 24 hr capsule TAKE 1 CAPSULE BY MOUTH DAILY WITH BREAKFAST 90 capsule 1   No current facility-administered medications on file prior to visit.  PAST MEDICAL HISTORY: Past Medical History:  Diagnosis Date  . Abnormal mammogram of left breast 01/22/2016  . Anemia 11/21/2014   -with elevated platelets   . Anxiety   . Asthma   . Breast cyst 11/21/2014   -L breast, upper L just off from center -stable per radiology 10/2014   . Constipation   . Dyspnea   . Fatty liver   . Gallbladder problem   . Gallstone pancreatitis 03/14/2016  . Generalized anxiety disorder 11/21/2014  . GERD (gastroesophageal reflux disease)   . Hypothyroidism   . IBS (irritable bowel syndrome)   . Menorrhagia  11/21/2014   -seeing gyn    . Obesity 11/21/2014  . Plantar fasciitis of left foot 11/21/2014  . Seasonal allergies 11/21/2014  . 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 Thyroid disease     PAST SURGICAL HISTORY: Past Surgical History:  Procedure Laterality Date  . ABDOMINAL HYSTERECTOMY    . BREAST BIOPSY    . BREAST EXCISIONAL BIOPSY    . BREAST LUMPECTOMY WITH RADIOACTIVE SEED LOCALIZATION Left 01/22/2016   Procedure: LEFT BREAST LUMPECTOMY WITH RADIOACTIVE SEED LOCALIZATION;  Surgeon: Fanny Skates, MD;  Location: [facility];  Service: General;  Laterality: Left;  . CESAREAN SECTION     x3  . CHOLECYSTECTOMY N/A 03/15/2016   Procedure: LAPAROSCOPIC CHOLECYSTECTOMY WITH INTRAOPERATIVE CHOLANGIOGRAM;  Surgeon: Jackolyn Confer, MD;  Location: [facility];  Service: General;  Laterality: N/A;  . CYSTO N/A 10/06/2015   Procedure: Consuela Mimes;  Surgeon: Nunzio Cobbs, MD;  Location: [facility];  Service: Gynecology;  Laterality: N/A;  . TUBAL LIGATION      SOCIAL HISTORY: Social History   Tobacco Use  . Smoking status: Former Smoker    Types: Cigarettes  . Smokeless tobacco: Never Used  Substance Use Topics  . Alcohol use: Yes    Alcohol/week: 0.6 oz    Types: 1 Glasses of wine per week    Comment: socially  . Drug use: No    FAMILY HISTORY: Family History  Problem Relation Age of Onset  . Breast cancer Mother 57       s/p lump and rad  . Skin cancer Mother        non-melanoma  . Thyroid disease Mother   . Skin cancer Father        non-melanoma  . Hypertension Father   . Hyperlipidemia Father   . Breast cancer Paternal Grandmother        dx 93s; s/p mastectomy  . Cervical cancer Maternal Grandmother   . Arthritis Maternal Grandmother   . CVA Maternal Grandmother   . Stroke Maternal Grandmother 80  . Heart attack Maternal Grandfather        d. 68s  . Colon cancer Paternal Grandfather 37  . Rectal cancer  Maternal Uncle 54  . Aortic aneurysm Maternal Uncle 53  . Uterine cancer Other        maternal great grandmother (MGM's mother)  . Stroke Other   . Cancer Other        maternal great grandfather (MGM's father); NOS cancer  . Breast cancer Other        maternal great grandmother (MGF's mother)  . Colon cancer Other        maternal great grandfather (MGF's father)  . Heart attack Other   . Colon cancer Other        paternal great grandfather (PGF's father)  . Heart attack Other  ROS: Review of Systems  Constitutional: Negative for weight loss.  Cardiovascular: Negative for chest pain.  Gastrointestinal: Negative for nausea and vomiting.  Musculoskeletal:       Negative for muscle weakness  Endo/Heme/Allergies:       Positive for polyphagia Negative for hypoglycemia  Psychiatric/Behavioral: Positive for depression. Negative for suicidal ideas.       Positive for stress    PHYSICAL EXAM: Blood pressure (!) 141/84, pulse 79, temperature 97.7 F (36.5 C), temperature source Oral, height 5' (1.524 m), weight 206 lb (93.4 kg), last menstrual period 09/26/2015, SpO2 98 %. Body mass index is 40.23 kg/m. Physical Exam  Constitutional: She is oriented to person, place, and time. She appears well-developed and well-nourished.  Cardiovascular: Normal rate.  Pulmonary/Chest: Effort normal.  Musculoskeletal: Normal range of motion.  Neurological: She is oriented to person, place, and time.  Skin: Skin is warm and dry.  Psychiatric: She has a normal mood and affect. Her behavior is normal.  Vitals reviewed.   RECENT LABS AND TESTS: BMET    Component Value Date/Time   NA 139 11/30/2017 0934   NA 139 02/19/2015 0911   K 4.6 11/30/2017 0934   K 4.0 02/19/2015 0911   CL 102 11/30/2017 0934   CO2 23 11/30/2017 0934   CO2 23 02/19/2015 0911   GLUCOSE 88 11/30/2017 0934   GLUCOSE 78 03/24/2016 1433   GLUCOSE 96 02/19/2015 0911   BUN 9 11/30/2017 0934   BUN 11.5 02/19/2015  0911   CREATININE 0.74 11/30/2017 0934   CREATININE 0.8 02/19/2015 0911   CALCIUM 9.0 11/30/2017 0934   CALCIUM 9.6 02/19/2015 0911   GFRNONAA 97 11/30/2017 0934   GFRAA 112 11/30/2017 0934   Lab Results  Component Value Date   HGBA1C 5.1 11/30/2017   Lab Results  Component Value Date   INSULIN 21.5 11/30/2017   CBC    Component Value Date/Time   WBC 7.9 11/30/2017 0934   WBC 8.9 03/29/2017 1428   RBC 4.68 11/30/2017 0934   RBC 4.41 03/29/2017 1428   HGB 13.5 11/30/2017 0934   HGB 12.8 02/19/2015 0911   HCT 41.6 11/30/2017 0934   HCT 38.3 02/19/2015 0911   PLT 399 (H) 11/30/2017 0934   MCV 89 11/30/2017 0934   MCV 83.6 02/19/2015 0911   MCH 28.8 11/30/2017 0934   MCH 28.7 03/17/2016 1048   MCHC 32.5 11/30/2017 0934   MCHC 33.5 03/29/2017 1428   RDW 13.5 11/30/2017 0934   RDW 17.0 (H) 02/19/2015 0911   LYMPHSABS 1.8 11/30/2017 0934   LYMPHSABS 2.0 02/19/2015 0911   MONOABS 0.6 03/29/2017 1428   MONOABS 0.7 02/19/2015 0911   EOSABS 0.0 11/30/2017 0934   BASOSABS 0.0 11/30/2017 0934   BASOSABS 0.1 02/19/2015 0911   Iron/TIBC/Ferritin/ %Sat    Component Value Date/Time   IRON 50 02/19/2015 0911   TIBC 359 02/19/2015 0911   FERRITIN 27 02/19/2015 0911   IRONPCTSAT 14 (L) 02/19/2015 0911   Lipid Panel     Component Value Date/Time   CHOL 210 (H) 11/30/2017 0934   TRIG 98 11/30/2017 0934   HDL 63 11/30/2017 0934   CHOLHDL 3.3 11/30/2017 0934   CHOLHDL 3 02/23/2015 1018   VLDL 30.2 02/23/2015 1018   LDLCALC 127 (H) 11/30/2017 0934   Hepatic Function Panel     Component Value Date/Time   PROT 6.6 11/30/2017 0934   PROT 6.9 02/19/2015 0911   ALBUMIN 4.2 11/30/2017 0934   ALBUMIN 3.5 02/19/2015  0911   AST 16 11/30/2017 0934   AST 13 02/19/2015 0911   ALT 16 11/30/2017 0934   ALT 14 02/19/2015 0911   ALKPHOS 95 11/30/2017 0934   ALKPHOS 84 02/19/2015 0911   BILITOT 0.2 11/30/2017 0934   BILITOT 0.23 02/19/2015 0911      Component Value Date/Time    TSH 4.75 (H) 11/08/2017 0854   TSH 4.30 06/15/2017 0940   TSH 3.36 05/04/2017 0936   Results for LEANORE, BIGGERS (MRN [id number]) as of 02/26/2018 13:25  Ref. Range 11/30/2017 09:34  Vitamin D, 25-Hydroxy Latest Ref Range: 30.0 - 100.0 ng/mL 19.7 (L)   ASSESSMENT AND PLAN: Prediabetes - Plan: metFORMIN (GLUCOPHAGE) 500 MG tablet  Vitamin D deficiency - Plan: Vitamin D, Ergocalciferol, (DRISDOL) 50000 units CAPS capsule  Other depression - with emotional eating - Plan: topiramate (TOPAMAX) 50 MG tablet  At risk for heart disease  Class 3 severe obesity with serious comorbidity and body mass index (BMI) of 40.0 to 44.9 in adult, unspecified obesity type ([place])  PLAN:  Vitamin D Deficiency Malka was informed that low vitamin D levels contributes to fatigue and are associated with obesity, breast, and colon cancer. She agrees to continue to take prescription Vit D @50 ,000 IU every week #4 with no refills. We will recheck labs in 1 month and she  will follow up for routine testing of vitamin D, at least 2-3 times per year. She was informed of the risk of over-replacement of vitamin D and agrees to not increase her dose unless she discusses this with [name] first. Maloni agrees to follow up as directed.  Pre-Diabetes Vivienne will continue to work on weight loss, exercise, and decreasing simple carbohydrates in her diet to help decrease the risk of diabetes. We dicussed metformin including benefits and risks. She was informed that eating too many simple carbohydrates or too many calories at one sitting increases the likelihood of GI side effects. Jillisa requested metformin for now and a prescription was written today for 1 month refill. Emerson agreed to follow up with [name] as directed to monitor her progress.  Depression with Emotional Eating Behaviors We discussed behavior modification techniques today to help Lujuana deal with her emotional eating and depression. She has agreed to start Topiramate 50 mg  qHS #30 with no refills and continue Effexor as prescribed. Demetra agreed to follow up as directed.  Cardiovascular risk counseling Solyana was given extended (15 minutes) coronary artery disease prevention counseling today. She is 47 y.o. female and has risk factors for heart disease including obesity. We discussed intensive lifestyle modifications today with an emphasis on specific weight loss instructions and strategies. Pt was also informed of the importance of increasing exercise and decreasing saturated fats to help prevent heart disease.  Obesity Carrah is currently in the action stage of change. As such, her goal is to continue with weight loss efforts She has agreed to follow the Category 2 plan Sabeen has been instructed to work up to a goal of 150 minutes of combined cardio and strengthening exercise per week for weight loss and overall health benefits. We discussed the following Behavioral Modification Strategies today: keeping healthy foods in the home, increasing lean protein intake, decreasing simple carbohydrates, decrease eating out and dealing with family or coworker sabotage  Catheryne has agreed to follow up with our clinic in 3 weeks. She was informed of the importance of frequent follow up visits to maximize her success with intensive lifestyle modifications for her multiple health conditions.  OBESITY BEHAVIORAL INTERVENTION VISIT  Today's visit was # 5 out of 22.  Starting weight: 212 lbs Starting date: 11/30/17 Today's weight : 206 lbs  Today's date: 02/26/2018 Total lbs lost to date: 6    ASK: We discussed the diagnosis of obesity with Morey Hummingbird Vold today and Sansa agreed to give [name] permission to discuss obesity behavioral modification therapy today.  ASSESS: Kelsi has the diagnosis of obesity and her BMI today is 40.23 Aruna is in the action stage of change   ADVISE: Jenice was educated on the multiple health risks of obesity as well as the benefit of  weight loss to improve her health. She was advised of the need for long term treatment and the importance of lifestyle modifications.  AGREE: Multiple dietary modification options and treatment options were discussed and  Emersen agreed to the above obesity treatment plan.  I, Doreene Nest, am acting as transcriptionist for Dennard Nip, MD  I have reviewed the above documentation for accuracy and completeness, and I agree with the above. -Dennard Nip, MD

## 2018-03-15 ENCOUNTER — Other Ambulatory Visit: Payer: Self-pay | Admitting: Endocrinology

## 2018-03-15 ENCOUNTER — Other Ambulatory Visit (INDEPENDENT_AMBULATORY_CARE_PROVIDER_SITE_OTHER): Payer: No Typology Code available for payment source

## 2018-03-15 DIAGNOSIS — E039 Hypothyroidism, unspecified: Secondary | ICD-10-CM

## 2018-03-15 LAB — T3, FREE: T3 FREE: 3.4 pg/mL (ref 2.3–4.2)

## 2018-03-15 LAB — T4, FREE: FREE T4: 0.75 ng/dL (ref 0.60–1.60)

## 2018-03-15 LAB — TSH: TSH: 2.03 u[IU]/mL (ref 0.35–4.50)

## 2018-03-19 ENCOUNTER — Ambulatory Visit (INDEPENDENT_AMBULATORY_CARE_PROVIDER_SITE_OTHER): Payer: No Typology Code available for payment source | Admitting: Family Medicine

## 2018-03-19 VITALS — BP 130/81 | HR 68 | Temp 98.1°F | Ht 60.0 in | Wt 206.0 lb

## 2018-03-19 DIAGNOSIS — R7303 Prediabetes: Secondary | ICD-10-CM

## 2018-03-19 DIAGNOSIS — E559 Vitamin D deficiency, unspecified: Secondary | ICD-10-CM

## 2018-03-19 DIAGNOSIS — F3289 Other specified depressive episodes: Secondary | ICD-10-CM

## 2018-03-19 DIAGNOSIS — Z9189 Other specified personal risk factors, not elsewhere classified: Secondary | ICD-10-CM

## 2018-03-19 DIAGNOSIS — Z6841 Body Mass Index (BMI) 40.0 and over, adult: Secondary | ICD-10-CM

## 2018-03-20 MED ORDER — TOPIRAMATE 100 MG PO TABS: 100.0000 mg | ORAL_TABLET | Freq: Every day | ORAL | 0 refills | Status: DC

## 2018-03-20 MED ORDER — METFORMIN HCL 500 MG PO TABS: 500.0000 mg | ORAL_TABLET | Freq: Every day | ORAL | 0 refills | Status: DC

## 2018-03-20 MED ORDER — VITAMIN D (ERGOCALCIFEROL) 1.25 MG (50000 UNIT) PO CAPS: 50000.0000 [IU] | ORAL_CAPSULE | ORAL | 0 refills | Status: DC

## 2018-03-20 NOTE — Progress Notes (Signed)
Office: [phone]  /  Fax: [phone]   HPI:   Chief Complaint: OBESITY Maria [name] is here to discuss her progress with her obesity treatment plan. She is on the Category 2 plan and is following her eating plan approximately 50 % of the time. She states she is exercising 0 minutes 0 times per week. Maria [name] has maintained weight well on the category 2 plan, but she is struggling with her diet prescription and would ike more options. Her weight is 206 lb (93.4 kg) today and has maintained weight over a period of 3 weeks since her last visit. She has lost 6 lbs since starting treatment with [name].  Pre-Diabetes Maria [name] has a diagnosis of prediabetes based on her elevated Hgb A1c and was informed this puts her at greater risk of developing diabetes. Maria [name] is stable on metformin and she continues to work on diet and exercise to decrease risk of diabetes. She denies nausea, vomiting or hypoglycemia.  At risk for diabetes Maria [name] is at higher than average risk for developing diabetes due to her obesity and pre-diabetes She currently denies polyuria or polydipsia.  Vitamin D deficiency Maria [name] has a diagnosis of vitamin D deficiency. Maria [name] is currently taking vit D and is not yet at goal. She denies nausea, vomiting or muscle weakness.  Depression with emotional eating behaviors Maria [name] started Topiramate, but she doesn't feel it has helped her mood or emotional eating. Maria [name] struggles with emotional eating and using food for comfort to the extent that it is negatively impacting her health. She often snacks when she is not hungry. Maria [name] sometimes feels she is out of control and then feels guilty that she made poor food choices. She has been working on behavior modification techniques to help reduce her emotional eating and has been somewhat successful. She shows no sign of suicidal or homicidal ideations.  Depression screen [facility] 2/9 02/12/2018 11/30/2017  Decreased Interest 0 1  Down, Depressed,  Hopeless 0 1  PHQ - 2 Score 0 2  Altered sleeping - 0  Tired, decreased energy - 1  Change in appetite - 1  Feeling bad or failure about yourself  - 0  Trouble concentrating - 0  Moving slowly or fidgety/restless - 0  Suicidal thoughts - 0  PHQ-9 Score - 4  Difficult doing work/chores - Not difficult at all    ALLERGIES: No Known Allergies  MEDICATIONS: Current Outpatient Medications on File Prior to Visit  Medication Sig Dispense Refill  . albuterol (PROVENTIL HFA;VENTOLIN HFA) 108 (90 Base) MCG/ACT inhaler Inhale 1 puff into the lungs every 6 (six) hours as needed for wheezing or shortness of breath. 3 Inhaler 0  . levothyroxine (SYNTHROID, LEVOTHROID) 75 MCG tablet TAKE 1 TABLET BY MOUTH DAILY. 90 tablet 0  . liothyronine (CYTOMEL) 5 MCG tablet Take 2 tabs with levothyroxine in the morning 180 tablet 2  . loratadine (CLARITIN) 10 MG tablet Take 1 tablet (10 mg total) by mouth daily. 90 tablet 0  . venlafaxine XR (EFFEXOR-XR) 150 MG 24 hr capsule TAKE 1 CAPSULE BY MOUTH DAILY WITH BREAKFAST 90 capsule 1   No current facility-administered medications on file prior to visit.     PAST MEDICAL HISTORY: Past Medical History:  Diagnosis Date  . Abnormal mammogram of left breast 01/22/2016  . Anemia 11/21/2014   -with elevated platelets   . Anxiety   . Asthma   . Breast cyst 11/21/2014   -L breast, upper L just off from center -stable per radiology 10/2014   .  Constipation   . Dyspnea   . Fatty liver   . Gallbladder problem   . Gallstone pancreatitis 03/14/2016  . Generalized anxiety disorder 11/21/2014  . GERD (gastroesophageal reflux disease)   . Hypothyroidism   . IBS (irritable bowel syndrome)   . Menorrhagia 11/21/2014   -seeing gyn    . Obesity 11/21/2014  . Plantar fasciitis of left foot 11/21/2014  . Seasonal allergies 11/21/2014  . 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 Thyroid disease     PAST SURGICAL HISTORY: Past  Surgical History:  Procedure Laterality Date  . ABDOMINAL HYSTERECTOMY    . BREAST BIOPSY    . BREAST EXCISIONAL BIOPSY    . BREAST LUMPECTOMY WITH RADIOACTIVE SEED LOCALIZATION Left 01/22/2016   Procedure: LEFT BREAST LUMPECTOMY WITH RADIOACTIVE SEED LOCALIZATION;  Surgeon: Fanny Skates, MD;  Location: [place];  Service: General;  Laterality: Left;  . CESAREAN SECTION     x3  . CHOLECYSTECTOMY N/A 03/15/2016   Procedure: LAPAROSCOPIC CHOLECYSTECTOMY WITH INTRAOPERATIVE CHOLANGIOGRAM;  Surgeon: Jackolyn Confer, MD;  Location: [facility];  Service: General;  Laterality: N/A;  . CYSTO N/A 10/06/2015   Procedure: Consuela Mimes;  Surgeon: Nunzio Cobbs, MD;  Location: [facility];  Service: Gynecology;  Laterality: N/A;  . TUBAL LIGATION      SOCIAL HISTORY: Social History   Tobacco Use  . Smoking status: Former Smoker    Types: Cigarettes  . Smokeless tobacco: Never Used  Substance Use Topics  . Alcohol use: Yes    Alcohol/week: 0.6 oz    Types: 1 Glasses of wine per week    Comment: socially  . Drug use: No    FAMILY HISTORY: Family History  Problem Relation Age of Onset  . Breast cancer Mother 39       s/p lump and rad  . Skin cancer Mother        non-melanoma  . Thyroid disease Mother   . Skin cancer Father        non-melanoma  . Hypertension Father   . Hyperlipidemia Father   . Breast cancer Paternal Grandmother        dx 16s; s/p mastectomy  . Cervical cancer Maternal Grandmother   . Arthritis Maternal Grandmother   . CVA Maternal Grandmother   . Stroke Maternal Grandmother 80  . Heart attack Maternal Grandfather        d. 56s  . Colon cancer Paternal Grandfather 19  . Rectal cancer Maternal Uncle 54  . Aortic aneurysm Maternal Uncle 53  . Uterine cancer Other        maternal great grandmother (MGM's mother)  . Stroke Other   . Cancer Other        maternal great grandfather (MGM's father); NOS cancer  . Breast cancer Other        maternal  great grandmother (MGF's mother)  . Colon cancer Other        maternal great grandfather (MGF's father)  . Heart attack Other   . Colon cancer Other        paternal great grandfather (PGF's father)  . Heart attack Other     ROS: Review of Systems  Constitutional: Negative for weight loss.  Gastrointestinal: Negative for nausea and vomiting.  Genitourinary: Negative for frequency.  Musculoskeletal:       Negative for muscle weakness  Endo/Heme/Allergies: Negative for polydipsia.       Negative for hypoglycemia  Psychiatric/Behavioral: Positive for depression.  Negative for suicidal ideas.    PHYSICAL EXAM: Blood pressure 130/81, pulse 68, temperature 98.1 F (36.7 C), temperature source Oral, height 5' (1.524 m), weight 206 lb (93.4 kg), last menstrual period 09/26/2015, SpO2 99 %. Body mass index is 40.23 kg/m. Physical Exam  Constitutional: She is oriented to person, place, and time. She appears well-developed and well-nourished.  Cardiovascular: Normal rate.  Pulmonary/Chest: Effort normal.  Musculoskeletal: Normal range of motion.  Neurological: She is oriented to person, place, and time.  Skin: Skin is warm and dry.  Psychiatric: She has a normal mood and affect. Her behavior is normal.  Vitals reviewed.   RECENT LABS AND TESTS: BMET    Component Value Date/Time   NA 139 11/30/2017 0934   NA 139 02/19/2015 0911   K 4.6 11/30/2017 0934   K 4.0 02/19/2015 0911   CL 102 11/30/2017 0934   CO2 23 11/30/2017 0934   CO2 23 02/19/2015 0911   GLUCOSE 88 11/30/2017 0934   GLUCOSE 78 03/24/2016 1433   GLUCOSE 96 02/19/2015 0911   BUN 9 11/30/2017 0934   BUN 11.5 02/19/2015 0911   CREATININE 0.74 11/30/2017 0934   CREATININE 0.8 02/19/2015 0911   CALCIUM 9.0 11/30/2017 0934   CALCIUM 9.6 02/19/2015 0911   GFRNONAA 97 11/30/2017 0934   GFRAA 112 11/30/2017 0934   Lab Results  Component Value Date   HGBA1C 5.1 11/30/2017   Lab Results  Component Value Date    INSULIN 21.5 11/30/2017   CBC    Component Value Date/Time   WBC 7.9 11/30/2017 0934   WBC 8.9 03/29/2017 1428   RBC 4.68 11/30/2017 0934   RBC 4.41 03/29/2017 1428   HGB 13.5 11/30/2017 0934   HGB 12.8 02/19/2015 0911   HCT 41.6 11/30/2017 0934   HCT 38.3 02/19/2015 0911   PLT 399 (H) 11/30/2017 0934   MCV 89 11/30/2017 0934   MCV 83.6 02/19/2015 0911   MCH 28.8 11/30/2017 0934   MCH 28.7 03/17/2016 1048   MCHC 32.5 11/30/2017 0934   MCHC 33.5 03/29/2017 1428   RDW 13.5 11/30/2017 0934   RDW 17.0 (H) 02/19/2015 0911   LYMPHSABS 1.8 11/30/2017 0934   LYMPHSABS 2.0 02/19/2015 0911   MONOABS 0.6 03/29/2017 1428   MONOABS 0.7 02/19/2015 0911   EOSABS 0.0 11/30/2017 0934   BASOSABS 0.0 11/30/2017 0934   BASOSABS 0.1 02/19/2015 0911   Iron/TIBC/Ferritin/ %Sat    Component Value Date/Time   IRON 50 02/19/2015 0911   TIBC 359 02/19/2015 0911   FERRITIN 27 02/19/2015 0911   IRONPCTSAT 14 (L) 02/19/2015 0911   Lipid Panel     Component Value Date/Time   CHOL 210 (H) 11/30/2017 0934   TRIG 98 11/30/2017 0934   HDL 63 11/30/2017 0934   CHOLHDL 3.3 11/30/2017 0934   CHOLHDL 3 02/23/2015 1018   VLDL 30.2 02/23/2015 1018   LDLCALC 127 (H) 11/30/2017 0934   Hepatic Function Panel     Component Value Date/Time   PROT 6.6 11/30/2017 0934   PROT 6.9 02/19/2015 0911   ALBUMIN 4.2 11/30/2017 0934   ALBUMIN 3.5 02/19/2015 0911   AST 16 11/30/2017 0934   AST 13 02/19/2015 0911   ALT 16 11/30/2017 0934   ALT 14 02/19/2015 0911   ALKPHOS 95 11/30/2017 0934   ALKPHOS 84 02/19/2015 0911   BILITOT 0.2 11/30/2017 0934   BILITOT 0.23 02/19/2015 0911      Component Value Date/Time   TSH 2.03 03/15/2018 0902  TSH 4.75 (H) 11/08/2017 0854   TSH 4.30 06/15/2017 0940   Results for NEKO, MCGEEHAN (MRN [id number]) as of 03/20/2018 12:15  Ref. Range 11/30/2017 09:34  Vitamin D, 25-Hydroxy Latest Ref Range: 30.0 - 100.0 ng/mL 19.7 (L)   ASSESSMENT AND PLAN: Prediabetes - Plan:  metFORMIN (GLUCOPHAGE) 500 MG tablet  Vitamin D deficiency - Plan: Vitamin D, Ergocalciferol, (DRISDOL) 50000 units CAPS capsule  Other depression - with emotional eating - Plan: topiramate (TOPAMAX) 100 MG tablet  At risk for diabetes mellitus  Class 3 severe obesity with serious comorbidity and body mass index (BMI) of 40.0 to 44.9 in adult, unspecified obesity type ([place])  PLAN:  Pre-Diabetes Maria [name] will continue to work on weight loss, exercise, and decreasing simple carbohydrates in her diet to help decrease the risk of diabetes. We dicussed metformin including benefits and risks. She was informed that eating too many simple carbohydrates or too many calories at one sitting increases the likelihood of GI side effects. Maria [name] requested metformin for now and a prescription was written today for 1 month refill. Maria [name] agreed to follow up with [name] as directed to monitor her progress.  Diabetes risk counseling Maria [name] was given extended (15 minutes) diabetes prevention counseling today. She is 47 y.o. female and has risk factors for diabetes including obesity and pre-diabetes. We discussed intensive lifestyle modifications today with an emphasis on weight loss as well as increasing exercise and decreasing simple carbohydrates in her diet.  Vitamin D Deficiency Maria [name] was informed that low vitamin D levels contributes to fatigue and are associated with obesity, breast, and colon cancer. She agrees to continue to take prescription Vit D @50 ,000 IU every week #4 with no refills and will follow up for routine testing of vitamin D, at least 2-3 times per year. She was informed of the risk of over-replacement of vitamin D and agrees to not increase her dose unless she discusses this with [name] first. We will recheck labs in 1 month and Maria [name] agrees to follow up as directed.  Depression with Emotional Eating Behaviors We discussed behavior modification techniques today to help Maria [name] deal with her  emotional eating and depression. She has agreed to increase Topiramate to 100 mg qhs #30 with no refills and follow up as directed.  Obesity Maria [name] is currently in the action stage of change. As such, her goal is to continue with weight loss efforts She has agreed to keep a food journal with 1300 to 1500 calories and 80+ grams of protein daily Maria [name] has been instructed to work up to a goal of 150 minutes of combined cardio and strengthening exercise per week for weight loss and overall health benefits. We discussed the following Behavioral Modification Strategies today: increasing lean protein intake, decreasing simple carbohydrates  and work on meal planning and easy cooking plans  Maria [name] has agreed to follow up with our clinic in 2 to 3 weeks. She was informed of the importance of frequent follow up visits to maximize her success with intensive lifestyle modifications for her multiple health conditions.   OBESITY BEHAVIORAL INTERVENTION VISIT  Today's visit was # 6 out of 22.  Starting weight: 212 lbs Starting date: 11/30/17 Today's weight : 206 lbs Today's date: 03/19/2018 Total lbs lost to date: 6    ASK: We discussed the diagnosis of obesity with Maria [name] today and Maria [name] agreed to give [name] permission to discuss obesity behavioral modification therapy today.  ASSESS: Lacresia has the diagnosis of obesity and her BMI today is  40.23 Maria [name] is in the action stage of change   ADVISE: Maria [name] was educated on the multiple health risks of obesity as well as the benefit of weight loss to improve her health. She was advised of the need for long term treatment and the importance of lifestyle modifications.  AGREE: Multiple dietary modification options and treatment options were discussed and  Maria [name] agreed to the above obesity treatment plan.  I, Doreene Nest, am acting as transcriptionist for Dennard Nip, MD  I have reviewed the above documentation for accuracy and  completeness, and I agree with the above. -Dennard Nip, MD

## 2018-03-21 ENCOUNTER — Encounter: Payer: Self-pay | Admitting: Endocrinology

## 2018-03-21 ENCOUNTER — Ambulatory Visit (INDEPENDENT_AMBULATORY_CARE_PROVIDER_SITE_OTHER): Payer: No Typology Code available for payment source | Admitting: Endocrinology

## 2018-03-21 VITALS — BP 118/78 | HR 82 | Ht 60.0 in | Wt 208.8 lb

## 2018-03-21 DIAGNOSIS — E039 Hypothyroidism, unspecified: Secondary | ICD-10-CM

## 2018-03-21 NOTE — Progress Notes (Signed)
Patient ID: Maria [name], female   DOB: [birth year], 47 y.o.   MRN: [id number]           Referring Physician: Colin Benton  Reason for Appointment:  Hypothyroidism, follow-up visit  Chief complaint: Tiredness, low thyroid   History of Present Illness:   Hypothyroidism was first diagnosed in 2014  At the time of diagnosis patient was having symptoms of lethargy, cold sensitivity, difficulty concentrating, weight gain and muscle aches She thinks she had continued symptoms for a couple of years before she was diagnosed to have hypothyroidism in [place] However no records available of her original workup She was started on levothyroxine supplements and she felt significantly better with this but not back to normal   She had more improvement in her symptoms of brain fog and energy level as well as muscle aches.          The patient has been treated with mostly 75 g of levothyroxine In 2017 she was given additional doses of 88 g to take twice a week and 75 g was given the other 5 days of the week  RECENT history: She was seen in consultation in 8/18 because of complaining of lethargy and other symptoms as above Although she had a reasonably good level of T3 of 3.2 she has been given a trial of levothyroxine 62.5 and Cytomel 5 g daily as a trial         Subsequently she has still complained of some fatigue and no improvement in her energy level or weight Cytomel was increased up to 10 mcg on her last visit in 9/18 when T3 level was 2.7 Levothyroxine was increased up to 75 mcg in 4/19  At that time she was having occasional symptoms of fatigue but was more concerned about weight gain She tends to have some joint pains  She is fairly consistent with taking her levothyroxine and Cytomel before breakfast daily  More recently she has lost weight because of working with the weight loss specialist However since the last 3 weeks or so she has been feeling more tired and lethargic, has  been on new medications from obesity specialist for weight loss purposes  Her TSH is back to normal at 2.0 and had been previously relatively higher Also her free T4 is relatively better and free T3 stable at 3.4  Patient's weight history is as follows:  Wt Readings from Last 3 Encounters:  03/21/18 208 lb 12.8 oz (94.7 kg)  03/19/18 206 lb (93.4 kg)  02/26/18 206 lb (93.4 kg)    Thyroid function results have been as follows:  Lab Results  Component Value Date   TSH 2.03 03/15/2018   TSH 4.75 (H) 11/08/2017   TSH 4.30 06/15/2017   TSH 3.36 05/04/2017   FREET4 0.75 03/15/2018   FREET4 0.62 11/08/2017   FREET4 0.67 06/15/2017   FREET4 0.73 05/04/2017   T3FREE 3.4 03/15/2018   T3FREE 3.4 11/08/2017   T3FREE 3.5 06/15/2017     Past Medical History:  Diagnosis Date  . Abnormal mammogram of left breast 01/22/2016  . Anemia 11/21/2014   -with elevated platelets   . Anxiety   . Asthma   . Breast cyst 11/21/2014   -L breast, upper L just off from center -stable per radiology 10/2014   . Constipation   . Dyspnea   . Fatty liver   . Gallbladder problem   . Gallstone pancreatitis 03/14/2016  . Generalized anxiety disorder 11/21/2014  . GERD (gastroesophageal reflux  disease)   . Hypothyroidism   . IBS (irritable bowel syndrome)   . Menorrhagia 11/21/2014   -seeing gyn    . Obesity 11/21/2014  . Plantar fasciitis of left foot 11/21/2014  . Seasonal allergies 11/21/2014  . 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 Thyroid disease     Past Surgical History:  Procedure Laterality Date  . ABDOMINAL HYSTERECTOMY    . BREAST BIOPSY    . BREAST EXCISIONAL BIOPSY    . BREAST LUMPECTOMY WITH RADIOACTIVE SEED LOCALIZATION Left 01/22/2016   Procedure: LEFT BREAST LUMPECTOMY WITH RADIOACTIVE SEED LOCALIZATION;  Surgeon: Fanny Skates, MD;  Location: [facility];  Service: General;  Laterality: Left;  . CESAREAN SECTION     x3  .  CHOLECYSTECTOMY N/A 03/15/2016   Procedure: LAPAROSCOPIC CHOLECYSTECTOMY WITH INTRAOPERATIVE CHOLANGIOGRAM;  Surgeon: Jackolyn Confer, MD;  Location: [facility];  Service: General;  Laterality: N/A;  . CYSTO N/A 10/06/2015   Procedure: Consuela Mimes;  Surgeon: Nunzio Cobbs, MD;  Location: [facility];  Service: Gynecology;  Laterality: N/A;  . TUBAL LIGATION      Family History  Problem Relation Age of Onset  . Breast cancer Mother 22       s/p lump and rad  . Skin cancer Mother        non-melanoma  . Thyroid disease Mother   . Skin cancer Father        non-melanoma  . Hypertension Father   . Hyperlipidemia Father   . Breast cancer Paternal Grandmother        dx 1s; s/p mastectomy  . Cervical cancer Maternal Grandmother   . Arthritis Maternal Grandmother   . CVA Maternal Grandmother   . Stroke Maternal Grandmother 80  . Heart attack Maternal Grandfather        d. 65s  . Colon cancer Paternal Grandfather 47  . Rectal cancer Maternal Uncle 54  . Aortic aneurysm Maternal Uncle 53  . Uterine cancer Other        maternal great grandmother (MGM's mother)  . Stroke Other   . Cancer Other        maternal great grandfather (MGM's father); NOS cancer  . Breast cancer Other        maternal great grandmother (MGF's mother)  . Colon cancer Other        maternal great grandfather (MGF's father)  . Heart attack Other   . Colon cancer Other        paternal great grandfather (PGF's father)  . Heart attack Other     Social History:  reports that she has quit smoking. Her smoking use included cigarettes. She has never used smokeless tobacco. She reports that she drinks about 0.6 oz of alcohol per week. She reports that she does not use drugs.  Allergies: No Known Allergies  Allergies as of 03/21/2018   No Known Allergies     Medication List        Accurate as of 03/21/18  9:52 AM. Always use your most recent med list.          albuterol 108 (90 Base) MCG/ACT inhaler Commonly known  as:  PROVENTIL HFA;VENTOLIN HFA Inhale 1 puff into the lungs every 6 (six) hours as needed for wheezing or shortness of breath.   levothyroxine 75 MCG tablet Commonly known as:  SYNTHROID, LEVOTHROID TAKE 1 TABLET BY MOUTH DAILY.   liothyronine 5 MCG tablet Commonly known as:  CYTOMEL Take 2  tabs with levothyroxine in the morning   loratadine 10 MG tablet Commonly known as:  CLARITIN Take 1 tablet (10 mg total) by mouth daily.   metFORMIN 500 MG tablet Commonly known as:  GLUCOPHAGE Take 1 tablet (500 mg total) by mouth daily with breakfast.   topiramate 100 MG tablet Commonly known as:  TOPAMAX Take 1 tablet (100 mg total) by mouth daily.   venlafaxine XR 150 MG 24 hr capsule Commonly known as:  EFFEXOR-XR TAKE 1 CAPSULE BY MOUTH DAILY WITH BREAKFAST   Vitamin D (Ergocalciferol) 50000 units Caps capsule Commonly known as:  DRISDOL Take 1 capsule (50,000 Units total) by mouth every 7 (seven) days.          Review of Systems       She has had some problem with difficulty sleeping but with increasing her Topamax recently she did sleep better last night She has been on Effexor for quite some time        Examination:    BP 118/78   Pulse 82   Ht 5' (1.524 m)   Wt 208 lb 12.8 oz (94.7 kg)   LMP 09/26/2015 (Exact Date)   SpO2 98%   BMI 40.78 kg/m   Thyroid not palpable No swelling of the eyes Biceps reflexes are normal No peripheral edema or dry skin  Assessment:  HYPOTHYROIDISM, Primary and diagnosed in 2014  She is subjectively complaining of fatigue but after changing her levothyroxine her last visit in April of her labs are much better and all parameters are within normal range now Explained to her that her thyroid levels are now back to normal and has not shown any recent progression of her hypothyroidism  Fatigue is likely to be from nonspecific reasons, sleep difficulties and possibly effects of medications She has done better with a combination of  levothyroxine and liothyronine and she will continue this  Discuss her problems with fatigue with PCP or [name]   PLAN:  She will stay on levothyroxine 75 mcg Continue liothyronine 10 mcg daily Follow-up in 6 months   Bronwyn Belasco 03/21/2018, 9:52 AM    Note: This office note was prepared with Dragon voice recognition system technology. Any transcriptional errors that result from this process are unintentional.

## 2018-03-29 ENCOUNTER — Other Ambulatory Visit: Payer: No Typology Code available for payment source

## 2018-04-02 ENCOUNTER — Ambulatory Visit: Payer: No Typology Code available for payment source | Admitting: Endocrinology

## 2018-04-06 ENCOUNTER — Encounter (INDEPENDENT_AMBULATORY_CARE_PROVIDER_SITE_OTHER): Payer: Self-pay | Admitting: Family Medicine

## 2018-04-09 ENCOUNTER — Ambulatory Visit (INDEPENDENT_AMBULATORY_CARE_PROVIDER_SITE_OTHER): Payer: Self-pay | Admitting: Family Medicine

## 2018-04-09 ENCOUNTER — Encounter (INDEPENDENT_AMBULATORY_CARE_PROVIDER_SITE_OTHER): Payer: Self-pay

## 2018-04-09 NOTE — Telephone Encounter (Signed)
Please r/s

## 2018-04-19 ENCOUNTER — Ambulatory Visit (INDEPENDENT_AMBULATORY_CARE_PROVIDER_SITE_OTHER): Payer: Self-pay | Admitting: Family Medicine

## 2018-04-23 ENCOUNTER — Ambulatory Visit (INDEPENDENT_AMBULATORY_CARE_PROVIDER_SITE_OTHER): Payer: No Typology Code available for payment source | Admitting: Family Medicine

## 2018-04-23 VITALS — BP 130/83 | HR 71 | Temp 97.7°F | Ht 60.0 in | Wt 205.0 lb

## 2018-04-23 DIAGNOSIS — F418 Other specified anxiety disorders: Secondary | ICD-10-CM | POA: Diagnosis not present

## 2018-04-23 DIAGNOSIS — Z9189 Other specified personal risk factors, not elsewhere classified: Secondary | ICD-10-CM | POA: Diagnosis not present

## 2018-04-23 DIAGNOSIS — R7303 Prediabetes: Secondary | ICD-10-CM

## 2018-04-23 DIAGNOSIS — E559 Vitamin D deficiency, unspecified: Secondary | ICD-10-CM | POA: Diagnosis not present

## 2018-04-23 DIAGNOSIS — Z6841 Body Mass Index (BMI) 40.0 and over, adult: Secondary | ICD-10-CM

## 2018-04-23 MED ORDER — METFORMIN HCL 500 MG PO TABS: 500.0000 mg | ORAL_TABLET | Freq: Every day | ORAL | 0 refills | Status: DC

## 2018-04-23 MED ORDER — VITAMIN D (ERGOCALCIFEROL) 1.25 MG (50000 UNIT) PO CAPS: 50000.0000 [IU] | ORAL_CAPSULE | ORAL | 0 refills | Status: DC

## 2018-04-23 MED ORDER — LIRAGLUTIDE -WEIGHT MANAGEMENT 18 MG/3ML ~~LOC~~ SOPN: 3.0000 mg | PEN_INJECTOR | Freq: Every day | SUBCUTANEOUS | 0 refills | Status: DC

## 2018-04-24 ENCOUNTER — Encounter (INDEPENDENT_AMBULATORY_CARE_PROVIDER_SITE_OTHER): Payer: Self-pay

## 2018-04-24 LAB — COMPREHENSIVE METABOLIC PANEL
A/G RATIO: 1.9 (ref 1.2–2.2)
ALBUMIN: 4.5 g/dL (ref 3.5–5.5)
ALK PHOS: 91 IU/L (ref 39–117)
ALT: 19 IU/L (ref 0–32)
AST: 15 IU/L (ref 0–40)
BUN/Creatinine Ratio: 19 (ref 9–23)
BUN: 13 mg/dL (ref 6–24)
Bilirubin Total: 0.3 mg/dL (ref 0.0–1.2)
CHLORIDE: 103 mmol/L (ref 96–106)
CO2: 22 mmol/L (ref 20–29)
Calcium: 9.4 mg/dL (ref 8.7–10.2)
Creatinine, Ser: 0.67 mg/dL (ref 0.57–1.00)
GFR calc non Af Amer: 104 mL/min/{1.73_m2} (ref >59)
GFR, EST AFRICAN AMERICAN: 120 mL/min/{1.73_m2} (ref >59)
GLOBULIN, TOTAL: 2.4 g/dL (ref 1.5–4.5)
Glucose: 83 mg/dL (ref 65–99)
Potassium: 4.6 mmol/L (ref 3.5–5.2)
Sodium: 140 mmol/L (ref 134–144)
Total Protein: 6.9 g/dL (ref 6.0–8.5)

## 2018-04-24 LAB — LIPID PANEL WITH LDL/HDL RATIO
Cholesterol, Total: 205 mg/dL — ABNORMAL HIGH (ref 100–199)
HDL: 69 mg/dL (ref >39)
LDL Calculated: 121 mg/dL — ABNORMAL HIGH (ref 0–99)
LDl/HDL Ratio: 1.8 ratio (ref 0.0–3.2)
Triglycerides: 77 mg/dL (ref 0–149)
VLDL Cholesterol Cal: 15 mg/dL (ref 5–40)

## 2018-04-24 LAB — CBC WITH DIFFERENTIAL
BASOS: 0 %
Basophils Absolute: 0 10*3/uL (ref 0.0–0.2)
EOS (ABSOLUTE): 0.2 10*3/uL (ref 0.0–0.4)
EOS: 3 %
HEMATOCRIT: 43 % (ref 34.0–46.6)
HEMOGLOBIN: 14.1 g/dL (ref 11.1–15.9)
IMMATURE GRANULOCYTES: 0 %
Immature Grans (Abs): 0 10*3/uL (ref 0.0–0.1)
LYMPHS ABS: 1.8 10*3/uL (ref 0.7–3.1)
Lymphs: 21 %
MCH: 29.5 pg (ref 26.6–33.0)
MCHC: 32.8 g/dL (ref 31.5–35.7)
MCV: 90 fL (ref 79–97)
MONOCYTES: 6 %
MONOS ABS: 0.5 10*3/uL (ref 0.1–0.9)
NEUTROS ABS: 6 10*3/uL (ref 1.4–7.0)
Neutrophils: 70 %
RBC: 4.78 x10E6/uL (ref 3.77–5.28)
RDW: 12.7 % (ref 12.3–15.4)
WBC: 8.5 10*3/uL (ref 3.4–10.8)

## 2018-04-24 LAB — VITAMIN B12: VITAMIN B 12: 486 pg/mL (ref 232–1245)

## 2018-04-24 LAB — VITAMIN D 25 HYDROXY (VIT D DEFICIENCY, FRACTURES): Vit D, 25-Hydroxy: 34.7 ng/mL (ref 30.0–100.0)

## 2018-04-24 LAB — HEMOGLOBIN A1C
ESTIMATED AVERAGE GLUCOSE: 94 mg/dL
Hgb A1c MFr Bld: 4.9 % (ref 4.8–5.6)

## 2018-04-24 LAB — FOLATE: FOLATE: 14.6 ng/mL (ref >3.0)

## 2018-04-24 LAB — INSULIN, RANDOM: INSULIN: 12.3 u[IU]/mL (ref 2.6–24.9)

## 2018-04-24 NOTE — Progress Notes (Signed)
Office: [phone]  /  Fax: [phone]   HPI:   Chief Complaint: OBESITY Maria [name] is here to discuss her progress with her obesity treatment plan. She is on the  keep a food journal with 1300 to 1500 calories and 80+ grams of protein daily and is following her eating plan approximately 60 % of the time. She states she is walking 30 minutes 4 times per week. [name] continues to lose weight , but she is struggling with journaling and hunger. [name] would like to discuss meal options. She cannot take Contrave or Belviq, due to her being on Effexor. Her weight is 205 lb (93 kg) today and has had a weight loss of 1 pound over a period of 5 weeks since her last visit. She has lost 7 lbs since starting treatment with [name].  Pre-Diabetes Maria [name] has a diagnosis of prediabetes based on her elevated Hgb A1c and was informed this puts her at greater risk of developing diabetes. She is taking metformin currently and continues to work on diet and exercise to decrease risk of diabetes. She denies nausea, vomiting or hypoglycemia.  At risk for diabetes Maria [name] is at higher than average risk for developing diabetes due to her obesity and prediabetes. She currently denies polyuria or polydipsia.  Vitamin D deficiency Maria [name] has a diagnosis of vitamin D deficiency. [name] is stable on vit D and she is due for labs. Maria [name] denies nausea, vomiting or muscle weakness.  Depression with emotional eating behaviors Maria [name] is struggling with emotional eating and using food for comfort to the extent that it is negatively impacting her health. She often snacks when she is not hungry. Maria [name] sometimes feels she is out of control and then feels guilty that she made poor food choices. She has been working on behavior modification techniques to help reduce her emotional eating and has been somewhat successful. Maria [name] feels her Topiramate isn't helping with emotional eating. She shows no sign of suicidal or homicidal  ideations.  Depression screen [facility] 2/9 02/12/2018 11/30/2017  Decreased Interest 0 1  Down, Depressed, Hopeless 0 1  PHQ - 2 Score 0 2  Altered sleeping - 0  Tired, decreased energy - 1  Change in appetite - 1  Feeling bad or failure about yourself  - 0  Trouble concentrating - 0  Moving slowly or fidgety/restless - 0  Suicidal thoughts - 0  PHQ-9 Score - 4  Difficult doing work/chores - Not difficult at all     ALLERGIES: No Known Allergies  MEDICATIONS: Current Outpatient Medications on File Prior to Visit  Medication Sig Dispense Refill  . albuterol (PROVENTIL HFA;VENTOLIN HFA) 108 (90 Base) MCG/ACT inhaler Inhale 1 puff into the lungs every 6 (six) hours as needed for wheezing or shortness of breath. 3 Inhaler 0  . levothyroxine (SYNTHROID, LEVOTHROID) 75 MCG tablet TAKE 1 TABLET BY MOUTH DAILY. 90 tablet 0  . liothyronine (CYTOMEL) 5 MCG tablet Take 2 tabs with levothyroxine in the morning 180 tablet 2  . loratadine (CLARITIN) 10 MG tablet Take 1 tablet (10 mg total) by mouth daily. 90 tablet 0  . venlafaxine XR (EFFEXOR-XR) 150 MG 24 hr capsule TAKE 1 CAPSULE BY MOUTH DAILY WITH BREAKFAST 90 capsule 1   No current facility-administered medications on file prior to visit.     PAST MEDICAL HISTORY: Past Medical History:  Diagnosis Date  . Abnormal mammogram of left breast 01/22/2016  . Anemia 11/21/2014   -with elevated platelets   . Anxiety   .  Asthma   . Breast cyst 11/21/2014   -L breast, upper L just off from center -stable per radiology 10/2014   . Constipation   . Dyspnea   . Fatty liver   . Gallbladder problem   . Gallstone pancreatitis 03/14/2016  . Generalized anxiety disorder 11/21/2014  . GERD (gastroesophageal reflux disease)   . Hypothyroidism   . IBS (irritable bowel syndrome)   . Menorrhagia 11/21/2014   -seeing gyn    . Obesity 11/21/2014  . Plantar fasciitis of left foot 11/21/2014  . Seasonal allergies 11/21/2014  . Status post total abdominal hysterectomy and  bilateral salpingo-oophorectomy 10/06/2015  . Thyroid disease     PAST SURGICAL HISTORY: Past Surgical History:  Procedure Laterality Date  . ABDOMINAL HYSTERECTOMY    . BREAST BIOPSY    . BREAST EXCISIONAL BIOPSY    . BREAST LUMPECTOMY WITH RADIOACTIVE SEED LOCALIZATION Left 01/22/2016   Procedure: LEFT BREAST LUMPECTOMY WITH RADIOACTIVE SEED LOCALIZATION;  Surgeon: Fanny Skates, MD;  Location: [facility];  Service: General;  Laterality: Left;  . CESAREAN SECTION     x3  . CHOLECYSTECTOMY N/A 03/15/2016   Procedure: LAPAROSCOPIC CHOLECYSTECTOMY WITH INTRAOPERATIVE CHOLANGIOGRAM;  Surgeon: Jackolyn Confer, MD;  Location: [facility];  Service: General;  Laterality: N/A;  . CYSTO N/A 10/06/2015   Procedure: Consuela Mimes;  Surgeon: Nunzio Cobbs, MD;  Location: [facility];  Service: Gynecology;  Laterality: N/A;  . TUBAL LIGATION      SOCIAL HISTORY: Social History   Tobacco Use  . Smoking status: Former Smoker    Types: Cigarettes  . Smokeless tobacco: Never Used  Substance Use Topics  . Alcohol use: Yes    Alcohol/week: 1.0 standard drinks    Types: 1 Glasses of wine per week    Comment: socially  . Drug use: No    FAMILY HISTORY: Family History  Problem Relation Age of Onset  . Breast cancer Mother 58       s/p lump and rad  . Skin cancer Mother        non-melanoma  . Thyroid disease Mother   . Skin cancer Father        non-melanoma  . Hypertension Father   . Hyperlipidemia Father   . Breast cancer Paternal Grandmother        dx 31s; s/p mastectomy  . Cervical cancer Maternal Grandmother   . Arthritis Maternal Grandmother   . CVA Maternal Grandmother   . Stroke Maternal Grandmother 80  . Heart attack Maternal Grandfather        d. 18s  . Colon cancer Paternal Grandfather 96  . Rectal cancer Maternal Uncle 54  . Aortic aneurysm Maternal Uncle 53  . Uterine cancer Other        maternal great grandmother (MGM's mother)  . Stroke Other   . Cancer  Other        maternal great grandfather (MGM's father); NOS cancer  . Breast cancer Other        maternal great grandmother (MGF's mother)  . Colon cancer Other        maternal great grandfather (MGF's father)  . Heart attack Other   . Colon cancer Other        paternal great grandfather (PGF's father)  . Heart attack Other     ROS: Review of Systems  Constitutional: Positive for weight loss.  Gastrointestinal: Negative for nausea and vomiting.  Genitourinary: Negative for frequency.  Musculoskeletal:  Negative for muscle weakness  Endo/Heme/Allergies: Negative for polydipsia.       Negative for hypoglycemia  Psychiatric/Behavioral: Positive for depression. Negative for suicidal ideas.    PHYSICAL EXAM: Blood pressure 130/83, pulse 71, temperature 97.7 F (36.5 C), temperature source Oral, height 5' (1.524 m), weight 205 lb (93 kg), last menstrual period 09/26/2015, SpO2 99 %. Body mass index is 40.04 kg/m. Physical Exam  Constitutional: She is oriented to person, place, and time. She appears well-developed and well-nourished.  Cardiovascular: Normal rate.  Pulmonary/Chest: Effort normal.  Musculoskeletal: Normal range of motion.  Neurological: She is oriented to person, place, and time.  Skin: Skin is warm and dry.  Psychiatric: She has a normal mood and affect. Her behavior is normal.  Vitals reviewed.   RECENT LABS AND TESTS: BMET    Component Value Date/Time   NA 140 04/23/2018 1135   NA 139 02/19/2015 0911   K 4.6 04/23/2018 1135   K 4.0 02/19/2015 0911   CL 103 04/23/2018 1135   CO2 22 04/23/2018 1135   CO2 23 02/19/2015 0911   GLUCOSE 83 04/23/2018 1135   GLUCOSE 78 03/24/2016 1433   GLUCOSE 96 02/19/2015 0911   BUN 13 04/23/2018 1135   BUN 11.5 02/19/2015 0911   CREATININE 0.67 04/23/2018 1135   CREATININE 0.8 02/19/2015 0911   CALCIUM 9.4 04/23/2018 1135   CALCIUM 9.6 02/19/2015 0911   GFRNONAA 104 04/23/2018 1135   GFRAA 120 04/23/2018 1135     Lab Results  Component Value Date   HGBA1C 4.9 04/23/2018   HGBA1C 5.1 11/30/2017   Lab Results  Component Value Date   INSULIN 12.3 04/23/2018   INSULIN 21.5 11/30/2017   CBC    Component Value Date/Time   WBC 8.5 04/23/2018 1135   WBC 8.9 03/29/2017 1428   RBC 4.78 04/23/2018 1135   RBC 4.41 03/29/2017 1428   HGB 14.1 04/23/2018 1135   HGB 12.8 02/19/2015 0911   HCT 43.0 04/23/2018 1135   HCT 38.3 02/19/2015 0911   PLT 399 (H) 11/30/2017 0934   MCV 90 04/23/2018 1135   MCV 83.6 02/19/2015 0911   MCH 29.5 04/23/2018 1135   MCH 28.7 03/17/2016 1048   MCHC 32.8 04/23/2018 1135   MCHC 33.5 03/29/2017 1428   RDW 12.7 04/23/2018 1135   RDW 17.0 (H) 02/19/2015 0911   LYMPHSABS 1.8 04/23/2018 1135   LYMPHSABS 2.0 02/19/2015 0911   MONOABS 0.6 03/29/2017 1428   MONOABS 0.7 02/19/2015 0911   EOSABS 0.2 04/23/2018 1135   BASOSABS 0.0 04/23/2018 1135   BASOSABS 0.1 02/19/2015 0911   Iron/TIBC/Ferritin/ %Sat    Component Value Date/Time   IRON 50 02/19/2015 0911   TIBC 359 02/19/2015 0911   FERRITIN 27 02/19/2015 0911   IRONPCTSAT 14 (L) 02/19/2015 0911   Lipid Panel     Component Value Date/Time   CHOL 205 (H) 04/23/2018 1135   TRIG 77 04/23/2018 1135   HDL 69 04/23/2018 1135   CHOLHDL 3.3 11/30/2017 0934   CHOLHDL 3 02/23/2015 1018   VLDL 30.2 02/23/2015 1018   LDLCALC 121 (H) 04/23/2018 1135   Hepatic Function Panel     Component Value Date/Time   PROT 6.9 04/23/2018 1135   PROT 6.9 02/19/2015 0911   ALBUMIN 4.5 04/23/2018 1135   ALBUMIN 3.5 02/19/2015 0911   AST 15 04/23/2018 1135   AST 13 02/19/2015 0911   ALT 19 04/23/2018 1135   ALT 14 02/19/2015 0911   ALKPHOS 91 04/23/2018  1135   ALKPHOS 84 02/19/2015 0911   BILITOT 0.3 04/23/2018 1135   BILITOT 0.23 02/19/2015 0911      Component Value Date/Time   TSH 2.03 03/15/2018 0902   TSH 4.75 (H) 11/08/2017 0854   TSH 4.30 06/15/2017 0940   Results for RESA, RINKS (MRN [id number]) as of  04/24/2018 13:41  Ref. Range 04/23/2018 11:35  Vitamin D, 25-Hydroxy Latest Ref Range: 30.0 - 100.0 ng/mL 34.7   ASSESSMENT AND PLAN: Prediabetes - Plan: Vitamin B12, CBC With Differential, Comprehensive metabolic panel, Folate, Hemoglobin A1c, Insulin, random, Lipid Panel With LDL/HDL Ratio, metFORMIN (GLUCOPHAGE) 500 MG tablet  Depression with anxiety  Vitamin D deficiency - Plan: Folate, VITAMIN D 25 Hydroxy (Vit-D Deficiency, Fractures), Vitamin D, Ergocalciferol, (DRISDOL) 50000 units CAPS capsule  At risk for diabetes mellitus  Class 3 severe obesity with serious comorbidity and body mass index (BMI) of 40.0 to 44.9 in adult, unspecified obesity type ([place]) - Plan: Liraglutide -Weight Management (SAXENDA) 18 MG/3ML SOPN  PLAN:  Pre-Diabetes Roselia will continue to work on weight loss, exercise, and decreasing simple carbohydrates in her diet to help decrease the risk of diabetes. We dicussed metformin including benefits and risks. She was informed that eating too many simple carbohydrates or too many calories at one sitting increases the likelihood of GI side effects. Kiandra agreed to continue metformin for now and a prescription was written today for 1 month refill. Maricarmen agreed to start liraglutide and follow up with [name] as directed to monitor her progress.  Diabetes risk counseling Orrie was given extended (15 minutes) diabetes prevention counseling today. She is 47 y.o. female and has risk factors for diabetes including obesity and prediabetes. We discussed intensive lifestyle modifications today with an emphasis on weight loss as well as increasing exercise and decreasing simple carbohydrates in her diet.  Vitamin D Deficiency Ledonna was informed that low vitamin D levels contributes to fatigue and are associated with obesity, breast, and colon cancer. She agrees to continue to take prescription Vit D @50 ,000 IU every week #4 with no refills and will follow up for routine testing of  vitamin D, at least 2-3 times per year. She was informed of the risk of over-replacement of vitamin D and agrees to not increase her dose unless she discusses this with [name] first. We will recheck labs and Sion agrees to follow up as directed.  Depression with Emotional Eating Behaviors We discussed behavior modification techniques today to help Wilmer deal with her emotional eating and depression. We will continue to follow. She has agreed to discontinue Topiramate and continue to take Effexor 150 mg daily and follow up as directed.  Obesity Kada is currently in the action stage of change. As such, her goal is to continue with weight loss efforts She has agreed to keep a food journal with 1300 to 1500 calories and 80+ grams of protein daily Malaijah has been instructed to work up to a goal of 150 minutes of combined cardio and strengthening exercise per week for weight loss and overall health benefits. We discussed the following Behavioral Modification Strategies today: no skipping meals, increasing lean protein intake and decreasing simple carbohydrates   We discussed various medication options to help Nana with her weight loss efforts and we both agreed to start Saxenda 3.0 mg #5 pens (patient to start at 0.6 mg daily).  Grisela has agreed to follow up with our clinic in 2 to 3 weeks. She was informed of the importance of frequent follow  up visits to maximize her success with intensive lifestyle modifications for her multiple health conditions.   OBESITY BEHAVIORAL INTERVENTION VISIT  Today's visit was # 7   Starting weight: 212 lbs Starting date: 11/30/17 Today's weight : 205 lbs Today's date: 04/23/2018 Total lbs lost to date: 7   ASK: We discussed the diagnosis of obesity with Morey [employer] today and Dakotah agreed to give [name] permission to discuss obesity behavioral modification therapy today.  ASSESS: Eran has the diagnosis of obesity and her BMI today is 40.04 Jasma is in  the action stage of change   ADVISE: Shadoe was educated on the multiple health risks of obesity as well as the benefit of weight loss to improve her health. She was advised of the need for long term treatment and the importance of lifestyle modifications to improve her current health and to decrease her risk of future health problems.  AGREE: Multiple dietary modification options and treatment options were discussed and  Chrysten agreed to follow the recommendations documented in the above note.  ARRANGE: Sindia was educated on the importance of frequent visits to treat obesity as outlined per CMS and USPSTF guidelines and agreed to schedule her next follow up appointment today.  I, Doreene Nest, am acting as transcriptionist for Dennard Nip, MD  I have reviewed the above documentation for accuracy and completeness, and I agree with the above. -Dennard Nip, MD

## 2018-04-30 ENCOUNTER — Ambulatory Visit: Payer: No Typology Code available for payment source | Admitting: Obstetrics and Gynecology

## 2018-05-14 ENCOUNTER — Encounter: Payer: Self-pay | Admitting: Obstetrics and Gynecology

## 2018-05-14 ENCOUNTER — Other Ambulatory Visit: Payer: Self-pay

## 2018-05-14 ENCOUNTER — Ambulatory Visit (INDEPENDENT_AMBULATORY_CARE_PROVIDER_SITE_OTHER): Payer: No Typology Code available for payment source | Admitting: Obstetrics and Gynecology

## 2018-05-14 ENCOUNTER — Ambulatory Visit (INDEPENDENT_AMBULATORY_CARE_PROVIDER_SITE_OTHER): Payer: No Typology Code available for payment source | Admitting: Family Medicine

## 2018-05-14 VITALS — BP 124/84 | HR 86 | Ht 59.75 in | Wt 205.0 lb

## 2018-05-14 VITALS — BP 112/75 | HR 76 | Temp 97.9°F | Ht 60.0 in | Wt 202.0 lb

## 2018-05-14 DIAGNOSIS — Z6839 Body mass index (BMI) 39.0-39.9, adult: Secondary | ICD-10-CM

## 2018-05-14 DIAGNOSIS — Z9189 Other specified personal risk factors, not elsewhere classified: Secondary | ICD-10-CM | POA: Diagnosis not present

## 2018-05-14 DIAGNOSIS — Z1211 Encounter for screening for malignant neoplasm of colon: Secondary | ICD-10-CM | POA: Diagnosis not present

## 2018-05-14 DIAGNOSIS — R7303 Prediabetes: Secondary | ICD-10-CM

## 2018-05-14 DIAGNOSIS — E559 Vitamin D deficiency, unspecified: Secondary | ICD-10-CM | POA: Diagnosis not present

## 2018-05-14 DIAGNOSIS — Z01419 Encounter for gynecological examination (general) (routine) without abnormal findings: Secondary | ICD-10-CM | POA: Diagnosis not present

## 2018-05-14 MED ORDER — VITAMIN D (ERGOCALCIFEROL) 1.25 MG (50000 UNIT) PO CAPS: 50000.0000 [IU] | ORAL_CAPSULE | ORAL | 0 refills | Status: DC

## 2018-05-14 NOTE — Patient Instructions (Signed)

## 2018-05-14 NOTE — Progress Notes (Signed)
Office: [phone]  /  Fax: [phone]   HPI:   Chief Complaint: OBESITY Maria [name] is here to discuss her progress with her obesity treatment plan. She is on the  keep a food journal with 1300-1500 calories and 80+g protein  and is following her eating plan approximately 90 % of the time. She states she is exercising by walking for 20 minutes 3 times per week. Maria [name] started [place] but accidentally started at the highest dose of 3.0 mg instead of 0.6 mg as instructed. She had nausea and vomiting for 3 days. She has restarted at the 0.6 mg and feels much better with only mild nausea occasionally.  Her weight is 202 lb (91.6 kg) today and has had a weight loss of 3 pounds over a period of 3 weeks since her last visit. She has lost 10 lbs since starting treatment with [name].  Pre-Diabetes Maria [name] has a diagnosis of prediabetes based on her elevated HgA1c and was informed this puts her at greater risk of developing diabetes. She is now victoza in addition to metformin currently. She reports some nausea. She is doing better with diet and insulin has improved. She continues to work on dietand exercise to decrease risk of diabetes. She denies hypoglycemia.  Vitamin D deficiency Maria [name] has a diagnosis of vitamin D deficiency. She is currently taking vit D her levels are slowly improving but not yet at goal. She denies vomiting or muscle weakness.  At risk for diabetes Maria [name] is at higher than averagerisk for developing diabetes due to her obesity. She currently denies polyuria or polydipsia.  ALLERGIES: No Known Allergies  MEDICATIONS: Current Outpatient Medications on File Prior to Visit  Medication Sig Dispense Refill  . albuterol (PROVENTIL HFA;VENTOLIN HFA) 108 (90 Base) MCG/ACT inhaler Inhale 1 puff into the lungs every 6 (six) hours as needed for wheezing or shortness of breath. 3 Inhaler 0  . levothyroxine (SYNTHROID, LEVOTHROID) 75 MCG tablet TAKE 1 TABLET BY MOUTH DAILY. 90 tablet 0  .  liothyronine (CYTOMEL) 5 MCG tablet Take 2 tabs with levothyroxine in the morning 180 tablet 2  . Liraglutide -Weight Management (SAXENDA) 18 MG/3ML SOPN Inject 3 mg into the skin daily. 5 pen 0  . loratadine (CLARITIN) 10 MG tablet Take 1 tablet (10 mg total) by mouth daily. 90 tablet 0  . venlafaxine XR (EFFEXOR-XR) 150 MG 24 hr capsule TAKE 1 CAPSULE BY MOUTH DAILY WITH BREAKFAST 90 capsule 1  . Vitamin D, Ergocalciferol, (DRISDOL) 50000 units CAPS capsule Take 1 capsule (50,000 Units total) by mouth every 7 (seven) days. 4 capsule 0   No current facility-administered medications on file prior to visit.     PAST MEDICAL HISTORY: Past Medical History:  Diagnosis Date  . Abnormal mammogram of left breast 01/22/2016  . Anemia 11/21/2014   -with elevated platelets   . Anxiety   . Asthma   . Breast cyst 11/21/2014   -L breast, upper L just off from center -stable per radiology 10/2014   . Constipation   . Dyspnea   . Fatty liver   . Gallbladder problem   . Gallstone pancreatitis 03/14/2016  . Generalized anxiety disorder 11/21/2014  . GERD (gastroesophageal reflux disease)   . Hypothyroidism   . IBS (irritable bowel syndrome)   . Menorrhagia 11/21/2014   -seeing gyn    . Obesity 11/21/2014  . Plantar fasciitis of left foot 11/21/2014  . Seasonal allergies 11/21/2014  . 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  Thyroid disease     PAST SURGICAL HISTORY: Past Surgical History:  Procedure Laterality Date  . ABDOMINAL HYSTERECTOMY    . BREAST BIOPSY    . BREAST EXCISIONAL BIOPSY    . BREAST LUMPECTOMY WITH RADIOACTIVE SEED LOCALIZATION Left 01/22/2016   Procedure: LEFT BREAST LUMPECTOMY WITH RADIOACTIVE SEED LOCALIZATION;  Surgeon: Fanny Skates, MD;  Location: [facility];  Service: General;  Laterality: Left;  . CESAREAN SECTION     x3  . CHOLECYSTECTOMY N/A 03/15/2016   Procedure: LAPAROSCOPIC CHOLECYSTECTOMY WITH INTRAOPERATIVE  CHOLANGIOGRAM;  Surgeon: Jackolyn Confer, MD;  Location: [facility];  Service: General;  Laterality: N/A;  . CYSTO N/A 10/06/2015   Procedure: Consuela Mimes;  Surgeon: Nunzio Cobbs, MD;  Location: [facility];  Service: Gynecology;  Laterality: N/A;  . TUBAL LIGATION      SOCIAL HISTORY: Social History   Tobacco Use  . Smoking status: Former Smoker    Types: Cigarettes  . Smokeless tobacco: Never Used  Substance Use Topics  . Alcohol use: Yes    Alcohol/week: 1.0 standard drinks    Types: 1 Glasses of wine per week    Comment: socially  . Drug use: No    FAMILY HISTORY: Family History  Problem Relation Age of Onset  . Breast cancer Mother 16       s/p lump and rad  . Skin cancer Mother        non-melanoma  . Thyroid disease Mother   . Skin cancer Father        non-melanoma  . Hypertension Father   . Hyperlipidemia Father   . Breast cancer Paternal Grandmother        dx 3s; s/p mastectomy  . Cervical cancer Maternal Grandmother   . Arthritis Maternal Grandmother   . CVA Maternal Grandmother   . Stroke Maternal Grandmother 80  . Heart attack Maternal Grandfather        d. 48s  . Colon cancer Paternal Grandfather 68  . Rectal cancer Maternal Uncle 54  . Aortic aneurysm Maternal Uncle 53  . Uterine cancer Other        maternal great grandmother (MGM's mother)  . Stroke Other   . Cancer Other        maternal great grandfather (MGM's father); NOS cancer  . Breast cancer Other        maternal great grandmother (MGF's mother)  . Colon cancer Other        maternal great grandfather (MGF's father)  . Heart attack Other   . Colon cancer Other        paternal great grandfather (PGF's father)  . Heart attack Other     ROS: Review of Systems  Constitutional: Positive for weight loss.  Gastrointestinal: Positive for nausea. Negative for vomiting.  Musculoskeletal:       Negative for muscle weakness  Endo/Heme/Allergies:       Negative for hypoglycemia      PHYSICAL EXAM: Blood pressure 112/75, pulse 76, temperature 97.9 F (36.6 C), temperature source Oral, height 5' (1.524 m), weight 202 lb (91.6 kg), last menstrual period 09/26/2015, SpO2 98 %. Body mass index is 39.45 kg/m. Physical Exam  Constitutional: She is oriented to person, place, and time. She appears well-developed and well-nourished.  HENT:  Head: Normocephalic.  Cardiovascular: Normal rate.  Pulmonary/Chest: Effort normal.  Musculoskeletal: Normal range of motion.  Neurological: She is alert and oriented to person, place, and time.  Skin: Skin is warm and dry.  Psychiatric: She has a normal mood and affect. Her behavior is normal.  Vitals reviewed.   RECENT LABS AND TESTS: BMET    Component Value Date/Time   NA 140 04/23/2018 1135   NA 139 02/19/2015 0911   K 4.6 04/23/2018 1135   K 4.0 02/19/2015 0911   CL 103 04/23/2018 1135   CO2 22 04/23/2018 1135   CO2 23 02/19/2015 0911   GLUCOSE 83 04/23/2018 1135   GLUCOSE 78 03/24/2016 1433   GLUCOSE 96 02/19/2015 0911   BUN 13 04/23/2018 1135   BUN 11.5 02/19/2015 0911   CREATININE 0.67 04/23/2018 1135   CREATININE 0.8 02/19/2015 0911   CALCIUM 9.4 04/23/2018 1135   CALCIUM 9.6 02/19/2015 0911   GFRNONAA 104 04/23/2018 1135   GFRAA 120 04/23/2018 1135   Lab Results  Component Value Date   HGBA1C 4.9 04/23/2018   HGBA1C 5.1 11/30/2017   Lab Results  Component Value Date   INSULIN 12.3 04/23/2018   INSULIN 21.5 11/30/2017   CBC    Component Value Date/Time   WBC 8.5 04/23/2018 1135   WBC 8.9 03/29/2017 1428   RBC 4.78 04/23/2018 1135   RBC 4.41 03/29/2017 1428   HGB 14.1 04/23/2018 1135   HGB 12.8 02/19/2015 0911   HCT 43.0 04/23/2018 1135   HCT 38.3 02/19/2015 0911   PLT 399 (H) 11/30/2017 0934   MCV 90 04/23/2018 1135   MCV 83.6 02/19/2015 0911   MCH 29.5 04/23/2018 1135   MCH 28.7 03/17/2016 1048   MCHC 32.8 04/23/2018 1135   MCHC 33.5 03/29/2017 1428   RDW 12.7 04/23/2018 1135   RDW  17.0 (H) 02/19/2015 0911   LYMPHSABS 1.8 04/23/2018 1135   LYMPHSABS 2.0 02/19/2015 0911   MONOABS 0.6 03/29/2017 1428   MONOABS 0.7 02/19/2015 0911   EOSABS 0.2 04/23/2018 1135   BASOSABS 0.0 04/23/2018 1135   BASOSABS 0.1 02/19/2015 0911   Iron/TIBC/Ferritin/ %Sat    Component Value Date/Time   IRON 50 02/19/2015 0911   TIBC 359 02/19/2015 0911   FERRITIN 27 02/19/2015 0911   IRONPCTSAT 14 (L) 02/19/2015 0911   Lipid Panel     Component Value Date/Time   CHOL 205 (H) 04/23/2018 1135   TRIG 77 04/23/2018 1135   HDL 69 04/23/2018 1135   CHOLHDL 3.3 11/30/2017 0934   CHOLHDL 3 02/23/2015 1018   VLDL 30.2 02/23/2015 1018   LDLCALC 121 (H) 04/23/2018 1135   Hepatic Function Panel     Component Value Date/Time   PROT 6.9 04/23/2018 1135   PROT 6.9 02/19/2015 0911   ALBUMIN 4.5 04/23/2018 1135   ALBUMIN 3.5 02/19/2015 0911   AST 15 04/23/2018 1135   AST 13 02/19/2015 0911   ALT 19 04/23/2018 1135   ALT 14 02/19/2015 0911   ALKPHOS 91 04/23/2018 1135   ALKPHOS 84 02/19/2015 0911   BILITOT 0.3 04/23/2018 1135   BILITOT 0.23 02/19/2015 0911      Component Value Date/Time   TSH 2.03 03/15/2018 0902   TSH 4.75 (H) 11/08/2017 0854   TSH 4.30 06/15/2017 0940    Ref. Range 04/23/2018 11:35  Vitamin D, 25-Hydroxy Latest Ref Range: 30.0 - 100.0 ng/mL 34.7    ASSESSMENT AND PLAN: Prediabetes  Vitamin D deficiency - Plan: Vitamin D, Ergocalciferol, (DRISDOL) 50000 units CAPS capsule  At risk for diabetes mellitus  Class 2 severe obesity with serious comorbidity and body mass index (BMI) of 39.0 to 39.9 in adult, unspecified obesity type ([place])  PLAN:  Pre-Diabetes Maria [name] will continue to work on weight loss, exercise, and decreasing simple carbohydrates in her diet to help decrease the risk of diabetes. We dicussed metformin including benefits and risks. She was informed that eating too many simple carbohydrates or too many calories at one sitting increases the likelihood  of GI side effects. Maria [name] agreed to discontinue Metformin and continue Victoza 0.6 mg.  Maria [name] agreed to follow up with [name] as directed to monitor her progress.  Vitamin D Deficiency Maria [name] was informed that low vitamin D levels contributes to fatigue and are associated with obesity, breast, and colon cancer. She agrees to continue to take prescription Vit D @50 ,000 IU every week #4 with no refills and will follow up for routine testing of vitamin D, at least 2-3 times per year. She was informed of the risk of over-replacement of vitamin D and agrees to not increase her dose unless she discusses this with [name] first. Agrees to follow up with our clinic as directed.   At risk for diabetes Maria [name] is at higher than averagerisk for developing diabetes due to her obesity. She currently denies polyuria or polydipsia.  Obesity Maria [name] is currently in the action stage of change. As such, her goal is to continue with weight loss efforts She has agreed to keep a food journal with 1300-1500 calories and 80+g protein  Maria [name] has been instructed to work up to a goal of 150 minutes of combined cardio and strengthening exercise per week for weight loss and overall health benefits. We discussed the following Behavioral Modification Strategies today: increasing lean protein intake and decreasing simple carbohydrates  She agrees to continue Saxenda 0.6 mg and to take after breakfast to decrease nausea.   Maria [name] has agreed to follow up with our clinic in 3 weeks. She was informed of the importance of frequent follow up visits to maximize her success with intensive lifestyle modifications for her multiple health conditions.   OBESITY BEHAVIORAL INTERVENTION VISIT  Today's visit was # 8   Starting weight: 212 lb Starting date: 11/30/17 Today's weight : 202 lb Today's date: 05/14/2018 Total lbs lost to date: 10 lb    ASK: We discussed the diagnosis of obesity with Maria [name] Goodlin today and Aariah agreed to give  [name] permission to discuss obesity behavioral modification therapy today.  ASSESS: Maria [name] has the diagnosis of obesity and her BMI today is 39.45 Maria [name] is in the action stage of change   ADVISE: Maria [name] was educated on the multiple health risks of obesity as well as the benefit of weight loss to improve her health. She was advised of the need for long term treatment and the importance of lifestyle modifications to improve her current health and to decrease her risk of future health problems.  AGREE: Multiple dietary modification options and treatment options were discussed and  Latera agreed to follow the recommendations documented in the above note.  ARRANGE: Maria [name] was educated on the importance of frequent visits to treat obesity as outlined per CMS and USPSTF guidelines and agreed to schedule her next follow up appointment today.  I, Renee Ramus, am acting as transcriptionist for Dennard Nip, MD   I have reviewed the above documentation for accuracy and completeness, and I agree with the above. -Dennard Nip, MD

## 2018-05-14 NOTE — Progress Notes (Signed)
47 y.o. G40P3000 Married Caucasian female here for annual exam.    Having some night sweats which are management. No vaginal dryness.   Doing weight loss with Dr. Dennard Nip.  Stopped Metformin.  Not on any supplements.   On Effexor for anxiety.  It is working well.   PCP:   Colin Benton  Patient's last menstrual period was 09/26/2015 (exact date).     Period Cycle (Days): (hysterectomy)     Sexually active: Yes.    The current method of family planning is tubal ligation and hysterectomy.    Exercising: No.  The patient does not participate in regular exercise at present. Smoker:  no  Health Maintenance: Pap:  08/20/2015 normal.  Final pathology of cervix at hysterectomy - benign.  History of abnormal Pap:  no MMG:  01/29/2018 BI-RADS CATEGORY  1: Negative. Colonoscopy:  never BMD:   never  Result  n/a TDaP:  2016 Gardasil:   never HIV: at some point per patient Hep C:at some point per patient Screening Labs:   PCP/Weight management program.   reports that she has quit smoking. Her smoking use included cigarettes. She has never used smokeless tobacco. She reports that she drinks about 1.0 standard drinks of alcohol per week. She reports that she does not use drugs.  Past Medical History:  Diagnosis Date  . Abnormal mammogram of left breast 01/22/2016  . Anemia 11/21/2014   -with elevated platelets   . Anxiety   . Asthma   . Breast cyst 11/21/2014   -L breast, upper L just off from center -stable per radiology 10/2014   . Constipation   . Dyspnea   . Fatty liver   . Gallbladder problem   . Gallstone pancreatitis 03/14/2016  . Generalized anxiety disorder 11/21/2014  . GERD (gastroesophageal reflux disease)   . Hypothyroidism   . IBS (irritable bowel syndrome)   . Menorrhagia 11/21/2014   -seeing gyn    . Obesity 11/21/2014  . Plantar fasciitis of left foot 11/21/2014  . Seasonal allergies 11/21/2014  . 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 Thyroid disease     Past Surgical History:  Procedure Laterality Date  . ABDOMINAL HYSTERECTOMY    . BREAST BIOPSY    . BREAST EXCISIONAL BIOPSY    . BREAST LUMPECTOMY WITH RADIOACTIVE SEED LOCALIZATION Left 01/22/2016   Procedure: LEFT BREAST LUMPECTOMY WITH RADIOACTIVE SEED LOCALIZATION;  Surgeon: Fanny Skates, MD;  Location: [facility];  Service: General;  Laterality: Left;  . CESAREAN SECTION     x3  . CHOLECYSTECTOMY N/A 03/15/2016   Procedure: LAPAROSCOPIC CHOLECYSTECTOMY WITH INTRAOPERATIVE CHOLANGIOGRAM;  Surgeon: Jackolyn Confer, MD;  Location: [facility];  Service: General;  Laterality: N/A;  . CYSTO N/A 10/06/2015   Procedure: Consuela Mimes;  Surgeon: Nunzio Cobbs, MD;  Location: [facility];  Service: Gynecology;  Laterality: N/A;  . TUBAL LIGATION      Current Outpatient Medications  Medication Sig Dispense Refill  . albuterol (PROVENTIL HFA;VENTOLIN HFA) 108 (90 Base) MCG/ACT inhaler Inhale 1 puff into the lungs every 6 (six) hours as needed for wheezing or shortness of breath. 3 Inhaler 0  . levothyroxine (SYNTHROID, LEVOTHROID) 75 MCG tablet TAKE 1 TABLET BY MOUTH DAILY. 90 tablet 0  . liothyronine (CYTOMEL) 5 MCG tablet Take 2 tabs with levothyroxine in the morning 180 tablet 2  . Liraglutide -Weight Management (SAXENDA) 18 MG/3ML SOPN Inject 3 mg into the skin daily. 5 pen 0  .  loratadine (CLARITIN) 10 MG tablet Take 1 tablet (10 mg total) by mouth daily. 90 tablet 0  . venlafaxine XR (EFFEXOR-XR) 150 MG 24 hr capsule TAKE 1 CAPSULE BY MOUTH DAILY WITH BREAKFAST 90 capsule 1  . Vitamin D, Ergocalciferol, (DRISDOL) 50000 units CAPS capsule Take 1 capsule (50,000 Units total) by mouth every 7 (seven) days. 4 capsule 0   No current facility-administered medications for this visit.     Family History  Problem Relation Age of Onset  . Breast cancer Mother 25       s/p lump and rad  . Skin cancer Mother         non-melanoma  . Thyroid disease Mother   . Skin cancer Father        non-melanoma  . Hypertension Father   . Hyperlipidemia Father   . Breast cancer Paternal Grandmother        dx 3s; s/p mastectomy  . Cervical cancer Maternal Grandmother   . Arthritis Maternal Grandmother   . CVA Maternal Grandmother   . Stroke Maternal Grandmother 80  . Heart attack Maternal Grandfather        d. 67s  . Colon cancer Paternal Grandfather 36  . Rectal cancer Maternal Uncle 54  . Aortic aneurysm Maternal Uncle 53  . Uterine cancer Other        maternal great grandmother (MGM's mother)  . Stroke Other   . Cancer Other        maternal great grandfather (MGM's father); NOS cancer  . Breast cancer Other        maternal great grandmother (MGF's mother)  . Colon cancer Other        maternal great grandfather (MGF's father)  . Heart attack Other   . Colon cancer Other        paternal great grandfather (PGF's father)  . Heart attack Other     Review of Systems  Constitutional: Negative.   HENT: Negative.   Eyes: Negative.   Respiratory: Negative.   Cardiovascular: Negative.   Gastrointestinal: Negative.   Endocrine: Negative.   Genitourinary: Negative.   Musculoskeletal: Negative.   Skin: Negative.   Allergic/Immunologic: Negative.   Neurological: Negative.   Hematological: Negative.   Psychiatric/Behavioral: Negative.   All other systems reviewed and are negative.   Exam:   BP 124/84   Pulse 86   Ht 4' 11.75" (1.518 m)   Wt 205 lb (93 kg)   LMP 09/26/2015 (Exact Date)   BMI 40.37 kg/m     General appearance: alert, cooperative and appears stated age Head: Normocephalic, without obvious abnormality, atraumatic Neck: no adenopathy, supple, symmetrical, trachea midline and thyroid normal to inspection and palpation Lungs: clear to auscultation bilaterally Breasts: normal appearance, no masses or tenderness, No nipple retraction or dimpling, No nipple discharge or bleeding, No  axillary or supraclavicular adenopathy Heart: regular rate and rhythm Abdomen: soft, non-tender; no masses, no organomegaly Extremities: extremities normal, atraumatic, no cyanosis or edema Skin: Skin color, texture, turgor normal. No rashes or lesions Lymph nodes: Cervical, supraclavicular, and axillary nodes normal. No abnormal inguinal nodes palpated Neurologic: Grossly normal  Pelvic: External genitalia:  no lesions              Urethra:  normal appearing urethra with no masses, tenderness or lesions              Bartholins and Skenes: normal  Vagina: normal appearing vagina with normal color and discharge, no lesions              Cervix: absent.              Pap taken: No. Bimanual Exam:  Uterus:  absent              Adnexa: no mass, fullness, tenderness              Rectal exam: Yes.  .  Confirms.              Anus:  normal sphincter tone, no lesions  Chaperone was present for exam.  Assessment:   Well woman visit with normal exam. Status post TAH/bilateral salpingectomy/LOA/cystoscopy. Hx left breast biopsy. Fibrocystic change, PASH, fatty necrosis 2017.  Plan: Mammogram screening.  Recommended self breast awareness. Pap and HR HPV as above. Guidelines for Calcium, Vitamin D, regular exercise program including cardiovascular and weight bearing exercise. Labs through [name] and Dr. Maudie Mercury.  IFOB.  Follow up annually and prn.    After visit summary provided.

## 2018-05-23 ENCOUNTER — Other Ambulatory Visit: Payer: Self-pay | Admitting: Endocrinology

## 2018-06-04 ENCOUNTER — Ambulatory Visit (INDEPENDENT_AMBULATORY_CARE_PROVIDER_SITE_OTHER): Payer: No Typology Code available for payment source | Admitting: Family Medicine

## 2018-06-04 VITALS — BP 128/83 | HR 84 | Temp 97.7°F | Ht 60.0 in | Wt 203.0 lb

## 2018-06-04 DIAGNOSIS — Z9189 Other specified personal risk factors, not elsewhere classified: Secondary | ICD-10-CM | POA: Diagnosis not present

## 2018-06-04 DIAGNOSIS — Z6839 Body mass index (BMI) 39.0-39.9, adult: Secondary | ICD-10-CM | POA: Diagnosis not present

## 2018-06-04 DIAGNOSIS — F3289 Other specified depressive episodes: Secondary | ICD-10-CM | POA: Diagnosis not present

## 2018-06-04 DIAGNOSIS — E559 Vitamin D deficiency, unspecified: Secondary | ICD-10-CM

## 2018-06-04 MED ORDER — VITAMIN D (ERGOCALCIFEROL) 1.25 MG (50000 UNIT) PO CAPS: 50000.0000 [IU] | ORAL_CAPSULE | ORAL | 0 refills | Status: DC

## 2018-06-04 MED ORDER — BUPROPION HCL ER (SR) 150 MG PO TB12: 150.0000 mg | ORAL_TABLET | Freq: Every day | ORAL | 0 refills | Status: DC

## 2018-06-07 NOTE — Progress Notes (Signed)
Office: [phone]  /  Fax: [phone]   HPI:   Chief Complaint: OBESITY Maria [name] is here to discuss her progress with her obesity treatment plan. She is keeping a food journal with 1200-1500 calories and 80 grams of protein and is following her eating plan approximately 70 % of the time. She states she is walking 30 minutes 3-5 times per week. [name] is on Saxenda and increased the dose to 2.4 qAM. She states that her hunger has improved but she is still struggling with cravings. Stress/emotional eating are worse in the PM.  Her weight is 203 lb (92.1 kg) today and has not lost weight since her last visit. She has lost 10 lbs since starting treatment with Maria [name].  Vitamin D deficiency Maria [name] has a diagnosis of vitamin D deficiency. She is stable on prescription Vit D but she is not yet at goal. [name] denies nausea, vomiting or muscle weakness.  At risk for cardiovascular disease [name] is at a higher than average risk for cardiovascular disease due to obesity. She currently denies any chest pain.  Depression with emotional eating behaviors [name] is struggling with emotional eating and using food for comfort to the extent that it is negatively impacting her health. She often snacks when she is not hungry. [name] sometimes feels she is out of control and then feels guilty that she made poor food choices. [name] is on Effexor XR 150 mg qd but still notes fatigue. She states her emotional eating is worse in the PM.  She has been working on behavior modification techniques to help reduce her emotional eating and has been somewhat successful. She shows no sign of suicidal or homicidal ideations. [name] has no history of seizures.   Depression screen [facility] 2/9 02/12/2018 11/30/2017  Decreased Interest 0 1  Down, Depressed, Hopeless 0 1  PHQ - 2 Score 0 2  Altered sleeping - 0  Tired, decreased energy - 1  Change in appetite - 1  Feeling bad or failure about yourself  - 0  Trouble concentrating -  0  Moving slowly or fidgety/restless - 0  Suicidal thoughts - 0  PHQ-9 Score - 4  Difficult doing work/chores - Not difficult at all       ALLERGIES: No Known Allergies  MEDICATIONS: Current Outpatient Medications on File Prior to Visit  Medication Sig Dispense Refill  . albuterol (PROVENTIL HFA;VENTOLIN HFA) 108 (90 Base) MCG/ACT inhaler Inhale 1 puff into the lungs every 6 (six) hours as needed for wheezing or shortness of breath. 3 Inhaler 0  . levothyroxine (SYNTHROID, LEVOTHROID) 75 MCG tablet TAKE 1 TABLET BY MOUTH DAILY 90 tablet 0  . liothyronine (CYTOMEL) 5 MCG tablet Take 2 tabs with levothyroxine in the morning 180 tablet 2  . Liraglutide -Weight Management (SAXENDA) 18 MG/3ML SOPN Inject 3 mg into the skin daily. 5 pen 0  . loratadine (CLARITIN) 10 MG tablet Take 1 tablet (10 mg total) by mouth daily. 90 tablet 0  . venlafaxine XR (EFFEXOR-XR) 150 MG 24 hr capsule TAKE 1 CAPSULE BY MOUTH DAILY WITH BREAKFAST 90 capsule 1   No current facility-administered medications on file prior to visit.     PAST MEDICAL HISTORY: Past Medical History:  Diagnosis Date  . Abnormal mammogram of left breast 01/22/2016  . Anemia 11/21/2014   -with elevated platelets   . Anxiety   . Asthma   . Breast cyst 11/21/2014   -L breast, upper L just off from center -stable per radiology 10/2014   .  Constipation   . Dyspnea   . Fatty liver   . Gallbladder problem   . Gallstone pancreatitis 03/14/2016  . Generalized anxiety disorder 11/21/2014  . GERD (gastroesophageal reflux disease)   . Hypothyroidism   . IBS (irritable bowel syndrome)   . Menorrhagia 11/21/2014   -seeing gyn    . Obesity 11/21/2014  . Plantar fasciitis of left foot 11/21/2014  . Seasonal allergies 11/21/2014  . 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 Thyroid disease     PAST SURGICAL HISTORY: Past Surgical History:  Procedure Laterality Date  . ABDOMINAL HYSTERECTOMY    .  BREAST BIOPSY    . BREAST EXCISIONAL BIOPSY    . BREAST LUMPECTOMY WITH RADIOACTIVE SEED LOCALIZATION Left 01/22/2016   Procedure: LEFT BREAST LUMPECTOMY WITH RADIOACTIVE SEED LOCALIZATION;  Surgeon: Fanny Skates, MD;  Location: [place];  Service: General;  Laterality: Left;  . CESAREAN SECTION     x3  . CHOLECYSTECTOMY N/A 03/15/2016   Procedure: LAPAROSCOPIC CHOLECYSTECTOMY WITH INTRAOPERATIVE CHOLANGIOGRAM;  Surgeon: Jackolyn Confer, MD;  Location: [facility];  Service: General;  Laterality: N/A;  . CYSTO N/A 10/06/2015   Procedure: Consuela Mimes;  Surgeon: Nunzio Cobbs, MD;  Location: [facility];  Service: Gynecology;  Laterality: N/A;  . TUBAL LIGATION      SOCIAL HISTORY: Social History   Tobacco Use  . Smoking status: Former Smoker    Types: Cigarettes  . Smokeless tobacco: Never Used  Substance Use Topics  . Alcohol use: Yes    Alcohol/week: 1.0 standard drinks    Types: 1 Glasses of wine per week    Comment: socially  . Drug use: No    FAMILY HISTORY: Family History  Problem Relation Age of Onset  . Breast cancer Mother 52       s/p lump and rad  . Skin cancer Mother        non-melanoma  . Thyroid disease Mother   . Skin cancer Father        non-melanoma  . Hypertension Father   . Hyperlipidemia Father   . Breast cancer Paternal Grandmother        dx 14s; s/p mastectomy  . Cervical cancer Maternal Grandmother   . Arthritis Maternal Grandmother   . CVA Maternal Grandmother   . Stroke Maternal Grandmother 80  . Heart attack Maternal Grandfather        d. 30s  . Colon cancer Paternal Grandfather 33  . Rectal cancer Maternal Uncle 54  . Aortic aneurysm Maternal Uncle 53  . Uterine cancer Other        maternal great grandmother (MGM's mother)  . Stroke Other   . Cancer Other        maternal great grandfather (MGM's father); NOS cancer  . Breast cancer Other        maternal great grandmother (MGF's mother)  . Colon cancer Other         maternal great grandfather (MGF's father)  . Heart attack Other   . Colon cancer Other        paternal great grandfather (PGF's father)  . Heart attack Other     ROS: Review of Systems  Constitutional: Negative for weight loss.  Cardiovascular: Negative for chest pain.  Gastrointestinal: Negative for nausea and vomiting.  Musculoskeletal:       Negative muscle weakness  Neurological: Negative for seizures.  Psychiatric/Behavioral: Positive for depression. Negative for suicidal ideas.  Positive for stress Negative for homicidal ideas    PHYSICAL EXAM: Blood pressure 128/83, pulse 84, temperature 97.7 F (36.5 C), temperature source Oral, height 5' (1.524 m), weight 203 lb (92.1 kg), last menstrual period 09/26/2015, SpO2 96 %. Body mass index is 39.65 kg/m. Physical Exam  Constitutional: She is oriented to person, place, and time. She appears well-developed and well-nourished.  Cardiovascular: Normal rate.  Pulmonary/Chest: Effort normal.  Neurological: She is alert and oriented to person, place, and time.  Skin: Skin is warm and dry.  Psychiatric: She has a normal mood and affect. Her behavior is normal.  Vitals reviewed.   RECENT LABS AND TESTS: BMET    Component Value Date/Time   NA 140 04/23/2018 1135   NA 139 02/19/2015 0911   K 4.6 04/23/2018 1135   K 4.0 02/19/2015 0911   CL 103 04/23/2018 1135   CO2 22 04/23/2018 1135   CO2 23 02/19/2015 0911   GLUCOSE 83 04/23/2018 1135   GLUCOSE 78 03/24/2016 1433   GLUCOSE 96 02/19/2015 0911   BUN 13 04/23/2018 1135   BUN 11.5 02/19/2015 0911   CREATININE 0.67 04/23/2018 1135   CREATININE 0.8 02/19/2015 0911   CALCIUM 9.4 04/23/2018 1135   CALCIUM 9.6 02/19/2015 0911   GFRNONAA 104 04/23/2018 1135   GFRAA 120 04/23/2018 1135   Lab Results  Component Value Date   HGBA1C 4.9 04/23/2018   HGBA1C 5.1 11/30/2017   Lab Results  Component Value Date   INSULIN 12.3 04/23/2018   INSULIN 21.5 11/30/2017   CBC     Component Value Date/Time   WBC 8.5 04/23/2018 1135   WBC 8.9 03/29/2017 1428   RBC 4.78 04/23/2018 1135   RBC 4.41 03/29/2017 1428   HGB 14.1 04/23/2018 1135   HGB 12.8 02/19/2015 0911   HCT 43.0 04/23/2018 1135   HCT 38.3 02/19/2015 0911   PLT 399 (H) 11/30/2017 0934   MCV 90 04/23/2018 1135   MCV 83.6 02/19/2015 0911   MCH 29.5 04/23/2018 1135   MCH 28.7 03/17/2016 1048   MCHC 32.8 04/23/2018 1135   MCHC 33.5 03/29/2017 1428   RDW 12.7 04/23/2018 1135   RDW 17.0 (H) 02/19/2015 0911   LYMPHSABS 1.8 04/23/2018 1135   LYMPHSABS 2.0 02/19/2015 0911   MONOABS 0.6 03/29/2017 1428   MONOABS 0.7 02/19/2015 0911   EOSABS 0.2 04/23/2018 1135   BASOSABS 0.0 04/23/2018 1135   BASOSABS 0.1 02/19/2015 0911   Iron/TIBC/Ferritin/ %Sat    Component Value Date/Time   IRON 50 02/19/2015 0911   TIBC 359 02/19/2015 0911   FERRITIN 27 02/19/2015 0911   IRONPCTSAT 14 (L) 02/19/2015 0911   Lipid Panel     Component Value Date/Time   CHOL 205 (H) 04/23/2018 1135   TRIG 77 04/23/2018 1135   HDL 69 04/23/2018 1135   CHOLHDL 3.3 11/30/2017 0934   CHOLHDL 3 02/23/2015 1018   VLDL 30.2 02/23/2015 1018   LDLCALC 121 (H) 04/23/2018 1135   Hepatic Function Panel     Component Value Date/Time   PROT 6.9 04/23/2018 1135   PROT 6.9 02/19/2015 0911   ALBUMIN 4.5 04/23/2018 1135   ALBUMIN 3.5 02/19/2015 0911   AST 15 04/23/2018 1135   AST 13 02/19/2015 0911   ALT 19 04/23/2018 1135   ALT 14 02/19/2015 0911   ALKPHOS 91 04/23/2018 1135   ALKPHOS 84 02/19/2015 0911   BILITOT 0.3 04/23/2018 1135   BILITOT 0.23 02/19/2015 0911      Component  Value Date/Time   TSH 2.03 03/15/2018 0902   TSH 4.75 (H) 11/08/2017 0854   TSH 4.30 06/15/2017 0940   Results for DENA, ESPERANZA (MRN [id number]) as of 06/07/2018 13:01  Ref. Range 04/23/2018 11:35  Vitamin D, 25-Hydroxy Latest Ref Range: 30.0 - 100.0 ng/mL 34.7   ASSESSMENT AND PLAN: Vitamin D deficiency - Plan: Vitamin D, Ergocalciferol,  (DRISDOL) 50000 units CAPS capsule  Other depression - with emotional eating - Plan: buPROPion (WELLBUTRIN SR) 150 MG 12 hr tablet  At risk for heart disease  Class 2 severe obesity with serious comorbidity and body mass index (BMI) of 39.0 to 39.9 in adult, unspecified obesity type ([place])  PLAN: Vitamin D Deficiency Lynnex was informed that low vitamin D levels contributes to fatigue and are associated with obesity, breast, and colon cancer. She agrees to continue taking prescription Vit D @50 ,000 IU every week #4 with no refills. Delaila will follow up for routine testing of vitamin D, at least 2-3 times per year. She was informed of the risk of over-replacement of vitamin D and agrees to not increase her dose unless she discusses this with Maria [name] first. Emma will follow up with our office in 2-3 weeks.   Cardiovascular risk counselling Asencion was given extended (15 minutes) coronary artery disease prevention counseling today. She is 47 y.o. female and has risk factors for heart disease including obesity. We discussed intensive lifestyle modifications today with an emphasis on specific weight loss instructions and strategies. Pt was also informed of the importance of increasing exercise and decreasing saturated fats to help prevent heart disease. Errin agrees to follow up with our office in 2-3 weeks.   Depression with Emotional Eating Behaviors We discussed behavior modification techniques today to help Belita deal with her emotional eating and depression. She has agreed to start Wellbutrin SR 150 mg qd #30 with no refills and continue her Effexor XR 150 mg qd. Elias has agreed to follow up with our office in 2-3 weeks  Obesity Brookie is not currently in the action stage of change. As such, her goal is to continue with weight loss efforts She has agreed to keep a food journal with 1200-1500 calories and 80 protein  Raimi has been instructed to work up to a goal of 150 minutes of combined  cardio and strengthening exercise per week for weight loss and overall health benefits. We discussed the following Behavioral Modification Stratagies today: increasing lean protein intake and work on meal planning and easy cooking plans, better snacking choices and keep a strict food journal, travel eating strategies.  Evelean has agreed to follow up with our clinic in 2-3 weeks. She was informed of the importance of frequent follow up visits to maximize her success with intensive lifestyle modifications for her multiple health conditions.   OBESITY BEHAVIORAL INTERVENTION VISIT  Today's visit was # 9   Starting weight: 212 lbs Starting date: 11/30/2017 Today's weight : 203 lbs  Today's date: 06/04/2018 Total lbs lost to date: 10 At least 15 minutes were spent on discussing the following behavioral intervention visit.   ASK: We discussed the diagnosis of obesity with Morey Hummingbird Hennes today and Kimball agreed to give Maria [name] permission to discuss obesity behavioral modification therapy today.  ASSESS: Zanita has the diagnosis of obesity and her BMI today is 39.65 Michalene is not in the action stage of change   ADVISE: Leyli was educated on the multiple health risks of obesity as well as the benefit of weight loss to improve  her health. She was advised of the need for long term treatment and the importance of lifestyle modifications to improve her current health and to decrease her risk of future health problems.  AGREE: Multiple dietary modification options and treatment options were discussed and  Konstance agreed to follow the recommendations documented in the above note.  ARRANGE: Ladiamond was educated on the importance of frequent visits to treat obesity as outlined per CMS and USPSTF guidelines and agreed to schedule her next follow up appointment today.  Felipa Emory, am acting as transcriptionist for Dennard Nip, MD  I have reviewed the above documentation for accuracy and  completeness, and I agree with the above. -Dennard Nip, MD

## 2018-06-11 ENCOUNTER — Encounter (INDEPENDENT_AMBULATORY_CARE_PROVIDER_SITE_OTHER): Payer: Self-pay | Admitting: Family Medicine

## 2018-06-25 ENCOUNTER — Ambulatory Visit (INDEPENDENT_AMBULATORY_CARE_PROVIDER_SITE_OTHER): Payer: No Typology Code available for payment source | Admitting: Family Medicine

## 2018-06-25 VITALS — BP 114/76 | HR 73 | Temp 98.0°F | Ht 60.0 in | Wt 200.0 lb

## 2018-06-25 DIAGNOSIS — Z6839 Body mass index (BMI) 39.0-39.9, adult: Secondary | ICD-10-CM

## 2018-06-25 DIAGNOSIS — E559 Vitamin D deficiency, unspecified: Secondary | ICD-10-CM

## 2018-06-25 DIAGNOSIS — F3289 Other specified depressive episodes: Secondary | ICD-10-CM

## 2018-06-25 DIAGNOSIS — Z9189 Other specified personal risk factors, not elsewhere classified: Secondary | ICD-10-CM

## 2018-06-25 MED ORDER — VITAMIN D (ERGOCALCIFEROL) 1.25 MG (50000 UNIT) PO CAPS: 50000.0000 [IU] | ORAL_CAPSULE | ORAL | 0 refills | Status: DC

## 2018-06-25 MED ORDER — LIRAGLUTIDE -WEIGHT MANAGEMENT 18 MG/3ML ~~LOC~~ SOPN: 3.0000 mg | PEN_INJECTOR | Freq: Every day | SUBCUTANEOUS | 0 refills | Status: DC

## 2018-06-25 MED ORDER — BUPROPION HCL ER (SR) 150 MG PO TB12: 150.0000 mg | ORAL_TABLET | Freq: Every day | ORAL | 0 refills | Status: DC

## 2018-06-27 NOTE — Progress Notes (Signed)
Office: [phone]  /  Fax: [phone]   HPI:   Chief Complaint: OBESITY Maria [name] is here to discuss her progress with her obesity treatment plan. She is on the Category 2 plan and is following her eating plan approximately 80 % of the time. She states she is walking 20 minutes 3 to 4 times per week. Maria [name] continues to do well with weight loss. She has lost 5% of her body weight over 6 months which is meeting our goals. She struggles to eat all her food and she is stating to substitute Maria [name].  Her weight is 200 lb (90.7 kg) today and has had a weight loss of 3 pounds over a period of 3 weeks since her last visit. She has lost 12 lbs since starting treatment with [name].  Depression with emotional eating behaviors Maria [name] is struggling with emotional eating and using food for comfort to the extent that it is negatively impacting her health. She often snacks when she is not hungry. Maria [name] sometimes feels she is out of control and then feels guilty that she made poor food choices. Her mood is stable with decreased emotional eating. Her blood pressure is controlled.  Vitamin D deficiency Maria [name] has a diagnosis of vitamin D deficiency. She is currently taking vit D and is stable. She denies nausea, vomiting, or muscle weakness.  At risk for osteopenia and osteoporosis Maria [name] is at higher risk of osteopenia and osteoporosis due to vitamin D deficiency.   ALLERGIES: No Known Allergies  MEDICATIONS: Current Outpatient Medications on File Prior to Visit  Medication Sig Dispense Refill  . albuterol (PROVENTIL HFA;VENTOLIN HFA) 108 (90 Base) MCG/ACT inhaler Inhale 1 puff into the lungs every 6 (six) hours as needed for wheezing or shortness of breath. 3 Inhaler 0  . Insulin Pen Needle (PEN NEEDLES 31GX5/16") 31G X 8 MM MISC 1 each by Does not apply route daily.    Marland Kitchen levothyroxine (SYNTHROID, LEVOTHROID) 75 MCG tablet TAKE 1 TABLET BY MOUTH DAILY 90 tablet 0  . liothyronine (CYTOMEL) 5 MCG tablet  Take 2 tabs with levothyroxine in the morning 180 tablet 2  . loratadine (CLARITIN) 10 MG tablet Take 1 tablet (10 mg total) by mouth daily. 90 tablet 0  . venlafaxine XR (EFFEXOR-XR) 150 MG 24 hr capsule TAKE 1 CAPSULE BY MOUTH DAILY WITH BREAKFAST 90 capsule 1   No current facility-administered medications on file prior to visit.     PAST MEDICAL HISTORY: Past Medical History:  Diagnosis Date  . Abnormal mammogram of left breast 01/22/2016  . Anemia 11/21/2014   -with elevated platelets   . Anxiety   . Asthma   . Breast cyst 11/21/2014   -L breast, upper L just off from center -stable per radiology 10/2014   . Constipation   . Dyspnea   . Fatty liver   . Gallbladder problem   . Gallstone pancreatitis 03/14/2016  . Generalized anxiety disorder 11/21/2014  . GERD (gastroesophageal reflux disease)   . Hypothyroidism   . IBS (irritable bowel syndrome)   . Menorrhagia 11/21/2014   -seeing gyn    . Obesity 11/21/2014  . Plantar fasciitis of left foot 11/21/2014  . Seasonal allergies 11/21/2014  . 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 Thyroid disease     PAST SURGICAL HISTORY: Past Surgical History:  Procedure Laterality Date  . ABDOMINAL HYSTERECTOMY    . BREAST BIOPSY    . BREAST EXCISIONAL BIOPSY    . BREAST LUMPECTOMY WITH  RADIOACTIVE SEED LOCALIZATION Left 01/22/2016   Procedure: LEFT BREAST LUMPECTOMY WITH RADIOACTIVE SEED LOCALIZATION;  Surgeon: Fanny Skates, MD;  Location: [facility];  Service: General;  Laterality: Left;  . CESAREAN SECTION     x3  . CHOLECYSTECTOMY N/A 03/15/2016   Procedure: LAPAROSCOPIC CHOLECYSTECTOMY WITH INTRAOPERATIVE CHOLANGIOGRAM;  Surgeon: Jackolyn Confer, MD;  Location: [facility];  Service: General;  Laterality: N/A;  . CYSTO N/A 10/06/2015   Procedure: Consuela Mimes;  Surgeon: Nunzio Cobbs, MD;  Location: [facility];  Service: Gynecology;  Laterality: N/A;  . TUBAL LIGATION      SOCIAL  HISTORY: Social History   Tobacco Use  . Smoking status: Former Smoker    Types: Cigarettes  . Smokeless tobacco: Never Used  Substance Use Topics  . Alcohol use: Yes    Alcohol/week: 1.0 standard drinks    Types: 1 Glasses of wine per week    Comment: socially  . Drug use: No    FAMILY HISTORY: Family History  Problem Relation Age of Onset  . Breast cancer Mother 6       s/p lump and rad  . Skin cancer Mother        non-melanoma  . Thyroid disease Mother   . Skin cancer Father        non-melanoma  . Hypertension Father   . Hyperlipidemia Father   . Breast cancer Paternal Grandmother        dx 54s; s/p mastectomy  . Cervical cancer Maternal Grandmother   . Arthritis Maternal Grandmother   . CVA Maternal Grandmother   . Stroke Maternal Grandmother 80  . Heart attack Maternal Grandfather        d. 79s  . Colon cancer Paternal Grandfather 66  . Rectal cancer Maternal Uncle 54  . Aortic aneurysm Maternal Uncle 53  . Uterine cancer Other        maternal great grandmother (MGM's mother)  . Stroke Other   . Cancer Other        maternal great grandfather (MGM's father); NOS cancer  . Breast cancer Other        maternal great grandmother (MGF's mother)  . Colon cancer Other        maternal great grandfather (MGF's father)  . Heart attack Other   . Colon cancer Other        paternal great grandfather (PGF's father)  . Heart attack Other     ROS: Review of Systems  Constitutional: Positive for weight loss.  Gastrointestinal: Negative for nausea and vomiting.  Musculoskeletal:       Negative for muscle weakness.  Psychiatric/Behavioral: Positive for depression.    PHYSICAL EXAM: Blood pressure 114/76, pulse 73, temperature 98 F (36.7 C), temperature source Oral, height 5' (1.524 m), weight 200 lb (90.7 kg), last menstrual period 09/26/2015, SpO2 97 %. Body mass index is 39.06 kg/m. Physical Exam  Constitutional: She is oriented to person, place, and time. She  appears well-developed and well-nourished.  Cardiovascular: Normal rate.  Pulmonary/Chest: Effort normal.  Musculoskeletal: Normal range of motion.  Neurological: She is oriented to person, place, and time.  Skin: Skin is warm and dry.  Psychiatric: She has a normal mood and affect. Her behavior is normal.  Vitals reviewed.   RECENT LABS AND TESTS: BMET    Component Value Date/Time   NA 140 04/23/2018 1135   NA 139 02/19/2015 0911   K 4.6 04/23/2018 1135   K 4.0 02/19/2015 0911  CL 103 04/23/2018 1135   CO2 22 04/23/2018 1135   CO2 23 02/19/2015 0911   GLUCOSE 83 04/23/2018 1135   GLUCOSE 78 03/24/2016 1433   GLUCOSE 96 02/19/2015 0911   BUN 13 04/23/2018 1135   BUN 11.5 02/19/2015 0911   CREATININE 0.67 04/23/2018 1135   CREATININE 0.8 02/19/2015 0911   CALCIUM 9.4 04/23/2018 1135   CALCIUM 9.6 02/19/2015 0911   GFRNONAA 104 04/23/2018 1135   GFRAA 120 04/23/2018 1135   Lab Results  Component Value Date   HGBA1C 4.9 04/23/2018   HGBA1C 5.1 11/30/2017   Lab Results  Component Value Date   INSULIN 12.3 04/23/2018   INSULIN 21.5 11/30/2017   CBC    Component Value Date/Time   WBC 8.5 04/23/2018 1135   WBC 8.9 03/29/2017 1428   RBC 4.78 04/23/2018 1135   RBC 4.41 03/29/2017 1428   HGB 14.1 04/23/2018 1135   HGB 12.8 02/19/2015 0911   HCT 43.0 04/23/2018 1135   HCT 38.3 02/19/2015 0911   PLT 399 (H) 11/30/2017 0934   MCV 90 04/23/2018 1135   MCV 83.6 02/19/2015 0911   MCH 29.5 04/23/2018 1135   MCH 28.7 03/17/2016 1048   MCHC 32.8 04/23/2018 1135   MCHC 33.5 03/29/2017 1428   RDW 12.7 04/23/2018 1135   RDW 17.0 (H) 02/19/2015 0911   LYMPHSABS 1.8 04/23/2018 1135   LYMPHSABS 2.0 02/19/2015 0911   MONOABS 0.6 03/29/2017 1428   MONOABS 0.7 02/19/2015 0911   EOSABS 0.2 04/23/2018 1135   BASOSABS 0.0 04/23/2018 1135   BASOSABS 0.1 02/19/2015 0911   Iron/TIBC/Ferritin/ %Sat    Component Value Date/Time   IRON 50 02/19/2015 0911   TIBC 359 02/19/2015  0911   FERRITIN 27 02/19/2015 0911   IRONPCTSAT 14 (L) 02/19/2015 0911   Lipid Panel     Component Value Date/Time   CHOL 205 (H) 04/23/2018 1135   TRIG 77 04/23/2018 1135   HDL 69 04/23/2018 1135   CHOLHDL 3.3 11/30/2017 0934   CHOLHDL 3 02/23/2015 1018   VLDL 30.2 02/23/2015 1018   LDLCALC 121 (H) 04/23/2018 1135   Hepatic Function Panel     Component Value Date/Time   PROT 6.9 04/23/2018 1135   PROT 6.9 02/19/2015 0911   ALBUMIN 4.5 04/23/2018 1135   ALBUMIN 3.5 02/19/2015 0911   AST 15 04/23/2018 1135   AST 13 02/19/2015 0911   ALT 19 04/23/2018 1135   ALT 14 02/19/2015 0911   ALKPHOS 91 04/23/2018 1135   ALKPHOS 84 02/19/2015 0911   BILITOT 0.3 04/23/2018 1135   BILITOT 0.23 02/19/2015 0911      Component Value Date/Time   TSH 2.03 03/15/2018 0902   TSH 4.75 (H) 11/08/2017 0854   TSH 4.30 06/15/2017 0940   Results for SHARIYA, GASTER (MRN [id number]) as of 06/27/2018 06:17  Ref. Range 04/23/2018 11:35  Vitamin D, 25-Hydroxy Latest Ref Range: 30.0 - 100.0 ng/mL 34.7   ASSESSMENT AND PLAN: Vitamin D deficiency - Plan: Vitamin D, Ergocalciferol, (DRISDOL) 1.25 MG (50000 UT) CAPS capsule  Other depression - with emotional eating - Plan: buPROPion (WELLBUTRIN SR) 150 MG 12 hr tablet  At risk for osteoporosis  Class 2 severe obesity with serious comorbidity and body mass index (BMI) of 39.0 to 39.9 in adult, unspecified obesity type (Maria [name]) - Plan: Liraglutide -Weight Management (SAXENDA) 18 MG/3ML SOPN  PLAN:  Vitamin D Deficiency Maria [name] was informed that low vitamin D levels contributes to fatigue and are associated with obesity,  breast, and colon cancer. She agrees to continue to take prescription Vit D @50 ,000 IU every week #4 with no refills and will follow up for routine testing of vitamin D, at least 2-3 times per year. She was informed of the risk of over-replacement of vitamin D and agrees to not increase her dose unless she discusses this with [name] first.  Maria [name] agrees to follow up in 3 weeks.  At risk for osteopenia and osteoporosis Maria [name] was given extended (15 minutes) osteoporosis prevention counseling today. Maria [name] is at risk for osteopenia and osteoporosis due to her vitamin D deficiency. She was encouraged to take her vitamin D and follow her higher calcium diet and increase strengthening exercise to help strengthen her bones and decrease her risk of osteopenia and osteoporosis.  Depression with Emotional Eating Behaviors We discussed behavior modification techniques today to help Maria [name] deal with her emotional eating and depression. She has agreed to continue to take Wellbutrin SR 150mg  qd #30 with no refills and agreed to follow up as directed in 3 weeks.  Obesity Maria [name] is currently in the action stage of change. As such, her goal is to continue with weight loss efforts. She has agreed to change to keep a food journal with 1200 to 1500 calories and 85 grams of protein. Maria [name] has been instructed to work up to a goal of 150 minutes of combined cardio and strengthening exercise per week for weight loss and overall health benefits. We discussed the following Behavioral Modification Strategies today: increasing lean protein intake, holiday eating strategies, and emotional eating strategies. We discussed various medication options to help Cj with her weight loss efforts and we both agreed to continue Saxenda 3mg  qd #5 pens with no refills.  Forest has agreed to follow up with our clinic in 3 weeks. She was informed of the importance of frequent follow up visits to maximize her success with intensive lifestyle modifications for her multiple health conditions.   OBESITY BEHAVIORAL INTERVENTION VISIT  Today's visit was # 10   Starting weight: 212 lbs Starting date: 11/30/17 Today's weight : Weight: 200 lb (90.7 kg)  Today's date: 06/25/2018 Total lbs lost to date: 12  ASK: We discussed the diagnosis of obesity with Morey Hummingbird Mannes  today and Maria [name] agreed to give [name] permission to discuss obesity behavioral modification therapy today.  ASSESS: Libertie has the diagnosis of obesity and her BMI today is 34.7. Kashvi is in the action stage of change   ADVISE: Brandii was educated on the multiple health risks of obesity as well as the benefit of weight loss to improve her health. She was advised of the need for long term treatment and the importance of lifestyle modifications to improve her current health and to decrease her risk of future health problems.  AGREE: Multiple dietary modification options and treatment options were discussed and Addisen agreed to follow the recommendations documented in the above note.  ARRANGE: Bridey was educated on the importance of frequent visits to treat obesity as outlined per CMS and USPSTF guidelines and agreed to schedule her next follow up appointment today.  I, Marcille Blanco, am acting as transcriptionist for Starlyn Skeans, MD  I have reviewed the above documentation for accuracy and completeness, and I agree with the above. -Dennard Nip, MD

## 2018-07-17 ENCOUNTER — Ambulatory Visit (INDEPENDENT_AMBULATORY_CARE_PROVIDER_SITE_OTHER): Payer: No Typology Code available for payment source | Admitting: Family Medicine

## 2018-07-17 VITALS — BP 136/88 | HR 83 | Temp 97.7°F | Ht 60.0 in | Wt 203.0 lb

## 2018-07-17 DIAGNOSIS — E559 Vitamin D deficiency, unspecified: Secondary | ICD-10-CM | POA: Diagnosis not present

## 2018-07-17 DIAGNOSIS — F3289 Other specified depressive episodes: Secondary | ICD-10-CM

## 2018-07-17 DIAGNOSIS — Z6839 Body mass index (BMI) 39.0-39.9, adult: Secondary | ICD-10-CM

## 2018-07-17 MED ORDER — VITAMIN D (ERGOCALCIFEROL) 1.25 MG (50000 UNIT) PO CAPS: 50000.0000 [IU] | ORAL_CAPSULE | ORAL | 0 refills | Status: DC

## 2018-07-17 MED ORDER — BUPROPION HCL ER (SR) 200 MG PO TB12: 200.0000 mg | ORAL_TABLET | Freq: Every day | ORAL | 0 refills | Status: DC

## 2018-07-19 NOTE — Progress Notes (Signed)
Office: [phone]  /  Fax: [phone]   HPI:   Chief Complaint: OBESITY Maria [name] is here to discuss her progress with her obesity treatment plan. She is on the  keep a food journal with 1200-1500 calories and 85g of protein  and is following her eating plan approximately 70 % of the time. She states she is exercising by walking for 30 minutes 3 times per week. [name] did well over Thanksgiving but then started to snack on holiday sweets. She normally bakes a lot in December and often eats a lot of her baking.  Her weight is 203 lb (92.1 kg) today and has had a weight gain of 3 pounds over a period of 3 weeks since her last visit. She has lost 9 lbs since starting treatment with [name].  Vitamin D deficiency Maria [name] has a diagnosis of vitamin D deficiency, not yet at goal. She is currently taking vit D and denies nausea, vomiting or muscle weakness.  Depression with emotional eating behaviors Maria [name] is struggling with emotional eating and using food for comfort to the extent that it is negatively impacting her health. She often snacks when she is not hungry. Maria [name] sometimes feels she is out of control and then feels guilty that she made poor food choices. She has been working on behavior modification techniques to help reduce her emotional eating and has been somewhat successful. She notes increased stress/comfort eating in the last 2 weeks. She shows no sign of suicidal or homicidal ideations, but she feels frustrated she continues to eat emotionally.   Depression screen [facility] 2/9 02/12/2018 11/30/2017  Decreased Interest 0 1  Down, Depressed, Hopeless 0 1  PHQ - 2 Score 0 2  Altered sleeping - 0  Tired, decreased energy - 1  Change in appetite - 1  Feeling bad or failure about yourself  - 0  Trouble concentrating - 0  Moving slowly or fidgety/restless - 0  Suicidal thoughts - 0  PHQ-9 Score - 4  Difficult doing work/chores - Not difficult at all   ALLERGIES: No Known  Allergies  MEDICATIONS: Current Outpatient Medications on File Prior to Visit  Medication Sig Dispense Refill  . albuterol (PROVENTIL HFA;VENTOLIN HFA) 108 (90 Base) MCG/ACT inhaler Inhale 1 puff into the lungs every 6 (six) hours as needed for wheezing or shortness of breath. 3 Inhaler 0  . Insulin Pen Needle (PEN NEEDLES 31GX5/16") 31G X 8 MM MISC 1 each by Does not apply route daily.    Marland Kitchen levothyroxine (SYNTHROID, LEVOTHROID) 75 MCG tablet TAKE 1 TABLET BY MOUTH DAILY 90 tablet 0  . liothyronine (CYTOMEL) 5 MCG tablet Take 2 tabs with levothyroxine in the morning 180 tablet 2  . Liraglutide -Weight Management (SAXENDA) 18 MG/3ML SOPN Inject 3 mg into the skin daily. 5 pen 0  . loratadine (CLARITIN) 10 MG tablet Take 1 tablet (10 mg total) by mouth daily. 90 tablet 0  . venlafaxine XR (EFFEXOR-XR) 150 MG 24 hr capsule TAKE 1 CAPSULE BY MOUTH DAILY WITH BREAKFAST 90 capsule 1   No current facility-administered medications on file prior to visit.     PAST MEDICAL HISTORY: Past Medical History:  Diagnosis Date  . Abnormal mammogram of left breast 01/22/2016  . Anemia 11/21/2014   -with elevated platelets   . Anxiety   . Asthma   . Breast cyst 11/21/2014   -L breast, upper L just off from center -stable per radiology 10/2014   . Constipation   . Dyspnea   .  Fatty liver   . Gallbladder problem   . Gallstone pancreatitis 03/14/2016  . Generalized anxiety disorder 11/21/2014  . GERD (gastroesophageal reflux disease)   . Hypothyroidism   . IBS (irritable bowel syndrome)   . Menorrhagia 11/21/2014   -seeing gyn    . Obesity 11/21/2014  . Plantar fasciitis of left foot 11/21/2014  . Seasonal allergies 11/21/2014  . 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 Thyroid disease     PAST SURGICAL HISTORY: Past Surgical History:  Procedure Laterality Date  . ABDOMINAL HYSTERECTOMY    . BREAST BIOPSY    . BREAST EXCISIONAL BIOPSY    . BREAST LUMPECTOMY  WITH RADIOACTIVE SEED LOCALIZATION Left 01/22/2016   Procedure: LEFT BREAST LUMPECTOMY WITH RADIOACTIVE SEED LOCALIZATION;  Surgeon: Fanny Skates, MD;  Location: [facility];  Service: General;  Laterality: Left;  . CESAREAN SECTION     x3  . CHOLECYSTECTOMY N/A 03/15/2016   Procedure: LAPAROSCOPIC CHOLECYSTECTOMY WITH INTRAOPERATIVE CHOLANGIOGRAM;  Surgeon: Jackolyn Confer, MD;  Location: [facility];  Service: General;  Laterality: N/A;  . CYSTO N/A 10/06/2015   Procedure: Consuela Mimes;  Surgeon: Nunzio Cobbs, MD;  Location: [facility];  Service: Gynecology;  Laterality: N/A;  . TUBAL LIGATION      SOCIAL HISTORY: Social History   Tobacco Use  . Smoking status: Former Smoker    Types: Cigarettes  . Smokeless tobacco: Never Used  Substance Use Topics  . Alcohol use: Yes    Alcohol/week: 1.0 standard drinks    Types: 1 Glasses of wine per week    Comment: socially  . Drug use: No    FAMILY HISTORY: Family History  Problem Relation Age of Onset  . Breast cancer Mother 25       s/p lump and rad  . Skin cancer Mother        non-melanoma  . Thyroid disease Mother   . Skin cancer Father        non-melanoma  . Hypertension Father   . Hyperlipidemia Father   . Breast cancer Paternal Grandmother        dx 31s; s/p mastectomy  . Cervical cancer Maternal Grandmother   . Arthritis Maternal Grandmother   . CVA Maternal Grandmother   . Stroke Maternal Grandmother 80  . Heart attack Maternal Grandfather        d. 40s  . Colon cancer Paternal Grandfather 40  . Rectal cancer Maternal Uncle 54  . Aortic aneurysm Maternal Uncle 53  . Uterine cancer Other        maternal great grandmother (MGM's mother)  . Stroke Other   . Cancer Other        maternal great grandfather (MGM's father); NOS cancer  . Breast cancer Other        maternal great grandmother (MGF's mother)  . Colon cancer Other        maternal great grandfather (MGF's father)  . Heart attack Other   .  Colon cancer Other        paternal great grandfather (PGF's father)  . Heart attack Other     ROS: Review of Systems  Constitutional: Negative for weight loss.  Gastrointestinal: Negative for nausea and vomiting.  Musculoskeletal:       Negative for muscle weakness  Psychiatric/Behavioral: Positive for depression. Negative for suicidal ideas.       Negative for homicidal ideations     PHYSICAL EXAM: Blood pressure 136/88, pulse 83, temperature  97.7 F (36.5 C), temperature source Oral, height 5' (1.524 m), weight 203 lb (92.1 kg), last menstrual period 09/26/2015, SpO2 97 %. Body mass index is 39.65 kg/m. Physical Exam  Constitutional: She is oriented to person, place, and time. She appears well-developed and well-nourished.  HENT:  Head: Normocephalic.  Eyes: Pupils are equal, round, and reactive to light.  Neck: Normal range of motion.  Cardiovascular: Normal rate.  Pulmonary/Chest: Effort normal.  Musculoskeletal: Normal range of motion.  Neurological: She is alert and oriented to person, place, and time.  Skin: Skin is warm and dry.  Psychiatric: She has a normal mood and affect. Her behavior is normal.  Vitals reviewed.   RECENT LABS AND TESTS: BMET    Component Value Date/Time   NA 140 04/23/2018 1135   NA 139 02/19/2015 0911   K 4.6 04/23/2018 1135   K 4.0 02/19/2015 0911   CL 103 04/23/2018 1135   CO2 22 04/23/2018 1135   CO2 23 02/19/2015 0911   GLUCOSE 83 04/23/2018 1135   GLUCOSE 78 03/24/2016 1433   GLUCOSE 96 02/19/2015 0911   BUN 13 04/23/2018 1135   BUN 11.5 02/19/2015 0911   CREATININE 0.67 04/23/2018 1135   CREATININE 0.8 02/19/2015 0911   CALCIUM 9.4 04/23/2018 1135   CALCIUM 9.6 02/19/2015 0911   GFRNONAA 104 04/23/2018 1135   GFRAA 120 04/23/2018 1135   Lab Results  Component Value Date   HGBA1C 4.9 04/23/2018   HGBA1C 5.1 11/30/2017   Lab Results  Component Value Date   INSULIN 12.3 04/23/2018   INSULIN 21.5 11/30/2017   CBC     Component Value Date/Time   WBC 8.5 04/23/2018 1135   WBC 8.9 03/29/2017 1428   RBC 4.78 04/23/2018 1135   RBC 4.41 03/29/2017 1428   HGB 14.1 04/23/2018 1135   HGB 12.8 02/19/2015 0911   HCT 43.0 04/23/2018 1135   HCT 38.3 02/19/2015 0911   PLT 399 (H) 11/30/2017 0934   MCV 90 04/23/2018 1135   MCV 83.6 02/19/2015 0911   MCH 29.5 04/23/2018 1135   MCH 28.7 03/17/2016 1048   MCHC 32.8 04/23/2018 1135   MCHC 33.5 03/29/2017 1428   RDW 12.7 04/23/2018 1135   RDW 17.0 (H) 02/19/2015 0911   LYMPHSABS 1.8 04/23/2018 1135   LYMPHSABS 2.0 02/19/2015 0911   MONOABS 0.6 03/29/2017 1428   MONOABS 0.7 02/19/2015 0911   EOSABS 0.2 04/23/2018 1135   BASOSABS 0.0 04/23/2018 1135   BASOSABS 0.1 02/19/2015 0911   Iron/TIBC/Ferritin/ %Sat    Component Value Date/Time   IRON 50 02/19/2015 0911   TIBC 359 02/19/2015 0911   FERRITIN 27 02/19/2015 0911   IRONPCTSAT 14 (L) 02/19/2015 0911   Lipid Panel     Component Value Date/Time   CHOL 205 (H) 04/23/2018 1135   TRIG 77 04/23/2018 1135   HDL 69 04/23/2018 1135   CHOLHDL 3.3 11/30/2017 0934   CHOLHDL 3 02/23/2015 1018   VLDL 30.2 02/23/2015 1018   LDLCALC 121 (H) 04/23/2018 1135   Hepatic Function Panel     Component Value Date/Time   PROT 6.9 04/23/2018 1135   PROT 6.9 02/19/2015 0911   ALBUMIN 4.5 04/23/2018 1135   ALBUMIN 3.5 02/19/2015 0911   AST 15 04/23/2018 1135   AST 13 02/19/2015 0911   ALT 19 04/23/2018 1135   ALT 14 02/19/2015 0911   ALKPHOS 91 04/23/2018 1135   ALKPHOS 84 02/19/2015 0911   BILITOT 0.3 04/23/2018 1135   BILITOT  0.23 02/19/2015 0911      Component Value Date/Time   TSH 2.03 03/15/2018 0902   TSH 4.75 (H) 11/08/2017 0854   TSH 4.30 06/15/2017 0940    ASSESSMENT AND PLAN: Vitamin D deficiency - Plan: Vitamin D, Ergocalciferol, (DRISDOL) 1.25 MG (50000 UT) CAPS capsule  Other depression - with emotional eating  - Plan: buPROPion (WELLBUTRIN SR) 200 MG 12 hr tablet  Class 2 severe obesity  with serious comorbidity and body mass index (BMI) of 39.0 to 39.9 in adult, unspecified obesity type ([place])  PLAN: Vitamin D Deficiency Azalyn was informed that low vitamin D levels contributes to fatigue and are associated with obesity, breast, and colon cancer. She agrees to continue to take prescription Vit D @50 ,000 IU every week #4 with no refills and will follow up for routine testing of vitamin D, at least 2-3 times per year. She was informed of the risk of over-replacement of vitamin D and agrees to not increase her dose unless she discusses this with [name] first. Agrees to follow up with our clinic as directed. We will repeat labs at the next visit.   Depression with Emotional Eating Behaviors We discussed behavior modification techniques today to help Emry deal with her emotional eating and depression. She has agreed to increase  Wellbutrin SR 200 mg qAM #30 with no refills and agreed to follow up as directed.  Obesity Jonika is currently in the action stage of change. As such, her goal is to continue with weight loss efforts She has agreed to keep a food journal with 1200-1500 calories and 85g of protein daily.  Journei has been instructed to work up to a goal of 150 minutes of combined cardio and strengthening exercise per week for weight loss and overall health benefits. We discussed the following Behavioral Modification Strategies today: increasing lean protein intake, decreasing simple carbohydrates , work on meal planning and easy cooking plans, holiday eating strategies , celebration eating strategies, ways to avoid boredom eating and ways to avoid night time snacking.    Letesha has agreed to follow up with our clinic in 2 weeks. She was informed of the importance of frequent follow up visits to maximize her success with intensive lifestyle modifications for her multiple health conditions.   OBESITY BEHAVIORAL INTERVENTION VISIT  Today's visit was # 11   Starting weight: 212  lb Starting date: 11/30/17 Today's weight : Weight: 203 lb (92.1 kg)  Today's date: 07/17/18 Total lbs lost to date: 9 lb    ASK: We discussed the diagnosis of obesity with Morey Hummingbird Berrie today and Seriyah agreed to give [name] permission to discuss obesity behavioral modification therapy today.  ASSESS: Shakia has the diagnosis of obesity and her BMI today is 39.65 Ambika is in the action stage of change   ADVISE: Lorijean was educated on the multiple health risks of obesity as well as the benefit of weight loss to improve her health. She was advised of the need for long term treatment and the importance of lifestyle modifications to improve her current health and to decrease her risk of future health problems.  AGREE: Multiple dietary modification options and treatment options were discussed and  Brittanee agreed to follow the recommendations documented in the above note.  ARRANGE: Kiaraliz was educated on the importance of frequent visits to treat obesity as outlined per CMS and USPSTF guidelines and agreed to schedule her next follow up appointment today.  Leary Roca, am acting as Location manager for Dennard Nip, MD  I have reviewed the above documentation for accuracy and completeness, and I agree with the above. -Dennard Nip, MD

## 2018-07-30 ENCOUNTER — Encounter (INDEPENDENT_AMBULATORY_CARE_PROVIDER_SITE_OTHER): Payer: Self-pay | Admitting: Family Medicine

## 2018-07-30 ENCOUNTER — Ambulatory Visit (INDEPENDENT_AMBULATORY_CARE_PROVIDER_SITE_OTHER): Payer: No Typology Code available for payment source | Admitting: Family Medicine

## 2018-07-30 VITALS — BP 109/72 | HR 75 | Temp 97.8°F | Ht 60.0 in | Wt 204.0 lb

## 2018-07-30 DIAGNOSIS — Z9189 Other specified personal risk factors, not elsewhere classified: Secondary | ICD-10-CM

## 2018-07-30 DIAGNOSIS — M542 Cervicalgia: Secondary | ICD-10-CM | POA: Diagnosis not present

## 2018-07-30 DIAGNOSIS — Z6839 Body mass index (BMI) 39.0-39.9, adult: Secondary | ICD-10-CM

## 2018-07-30 DIAGNOSIS — E559 Vitamin D deficiency, unspecified: Secondary | ICD-10-CM | POA: Diagnosis not present

## 2018-07-30 DIAGNOSIS — G8929 Other chronic pain: Secondary | ICD-10-CM

## 2018-07-30 DIAGNOSIS — F3289 Other specified depressive episodes: Secondary | ICD-10-CM

## 2018-07-30 MED ORDER — BUPROPION HCL ER (SR) 200 MG PO TB12: 200.0000 mg | ORAL_TABLET | Freq: Every day | ORAL | 0 refills | Status: DC

## 2018-07-30 MED ORDER — CYCLOBENZAPRINE HCL 10 MG PO TABS: 10.0000 mg | ORAL_TABLET | Freq: Three times a day (TID) | ORAL | 0 refills | Status: DC | PRN

## 2018-07-30 MED ORDER — VITAMIN D (ERGOCALCIFEROL) 1.25 MG (50000 UNIT) PO CAPS: 50000.0000 [IU] | ORAL_CAPSULE | ORAL | 0 refills | Status: DC

## 2018-07-30 MED ORDER — LIRAGLUTIDE -WEIGHT MANAGEMENT 18 MG/3ML ~~LOC~~ SOPN: 3.0000 mg | PEN_INJECTOR | Freq: Every day | SUBCUTANEOUS | 0 refills | Status: DC

## 2018-07-31 NOTE — Progress Notes (Signed)
Office: [phone]  /  Fax: [phone]   HPI:   Chief Complaint: OBESITY Maria [name] is here to discuss her progress with her obesity treatment plan. She is keeping a food journal with 1200 to 1500 calories and 85 grams of protein and is following her eating plan approximately 40 % of the time. She states she is walking 30 minutes 3 times per week. Maria [name] is still on Saxenda and notes decreased hunger, but increased snacking and temptations this month. She is not journaling as much and is frustrated with herself.  Her weight is 204 lb (92.5 kg) today and has had a weight gain of 1 pound over a period of 2 weeks since her last visit. She has lost 8 lbs since starting treatment with [name].  Vitamin D deficiency Maria [name] has a diagnosis of vitamin D deficiency. She is currently stable on vit D and denies nausea, vomiting, or muscle weakness.  At risk for cardiovascular disease Maria [name] is at a higher than average risk for cardiovascular disease due to vitamin D deficiency and obesity. She currently denies any chest pain.  Depression with emotional eating behaviors Maria [name] is struggling with emotional eating and using food for comfort to the extent that it is negatively impacting her health. She often snacks when she is not hungry. Maria [name] sometimes feels she is out of control and then feels guilty that she made poor food choices. She has been working on behavior modification techniques to help reduce her emotional eating and has been somewhat successful. Her mood is stable on Wellbutrin, but has struggled with increased temptations, and she is feeling frustrated.  Chronic Neck Pain Maria [name] notes increased stress and feels her neck is tight. She still has full range of motion, but is having headaches almost daily. She is taking 800mg  ibuprofen daily.  ASSESSMENT AND PLAN:  Vitamin D deficiency - Plan: Vitamin D, Ergocalciferol, (DRISDOL) 1.25 MG (50000 UT) CAPS capsule  Chronic neck pain - Plan:  cyclobenzaprine (FLEXERIL) 10 MG tablet  Other depression - with emotional eating  - Plan: buPROPion (WELLBUTRIN SR) 200 MG 12 hr tablet  At risk for heart disease  Class 2 severe obesity with serious comorbidity and body mass index (BMI) of 39.0 to 39.9 in adult, unspecified obesity type ([place]) - Plan: Liraglutide -Weight Management (SAXENDA) 18 MG/3ML SOPN  PLAN:  Vitamin D Deficiency Maria [name] was informed that low vitamin D levels contributes to fatigue and are associated with obesity, breast, and colon cancer. She agrees to continue to take prescription Vit D @50 ,000 IU every week #4 with no refills and will follow up for routine testing of vitamin D, at least 2-3 times per year. She was informed of the risk of over-replacement of vitamin D and agrees to not increase her dose unless she discusses this with [name] first. Maria [name] agrees to follow up in 3 to 4 weeks.  Cardiovascular risk counseling Maria [name] was given extended (15 minutes) coronary artery disease prevention counseling today. She is 47 y.o. female and has risk factors for heart disease including vitamin D deficiency and obesity. We discussed intensive lifestyle modifications today with an emphasis on specific weight loss instructions and strategies. Pt was also informed of the importance of increasing exercise and decreasing saturated fats to help prevent heart disease.  Chronic Neck Pain Maria [name] agrees to start Flexeril 10mg , 1 PO every 8 hours as needed #30 with no refills and continue taking ibuprofen to 400mg  every 8 hours as needed. She will follow up as directed.  Depression with Emotional Eating Behaviors We discussed behavior modification techniques today to help Maria [name] deal with her emotional eating and depression. She has agreed to continue to take Wellbutrin SR 200mg  qd and Effexor. She agreed to follow up as directed.  Obesity Maria [name] is currently in the action stage of change. As such, her goal is to continue with weight loss  efforts. She has agreed to follow the Category 3 plan. She agrees to continue [name] 3.0mg  #5 pens with no refills. Maria [name] has been instructed to work up to a goal of 150 minutes of combined cardio and strengthening exercise per week for weight loss and overall health benefits. We discussed the following Behavioral Modification Strategies today: increasing lean protein intake, decreasing simple carbohydrates, work on meal planning and easy cooking plans, and holiday eating strategies.   Maria [name] has agreed to follow up with our clinic in 3 to 4 weeks. She was informed of the importance of frequent follow up visits to maximize her success with intensive lifestyle modifications for her multiple health conditions.  ALLERGIES: No Known Allergies  MEDICATIONS: Current Outpatient Medications on File Prior to Visit  Medication Sig Dispense Refill  . albuterol (PROVENTIL HFA;VENTOLIN HFA) 108 (90 Base) MCG/ACT inhaler Inhale 1 puff into the lungs every 6 (six) hours as needed for wheezing or shortness of breath. 3 Inhaler 0  . Insulin Pen Needle (PEN NEEDLES 31GX5/16") 31G X 8 MM MISC 1 each by Does not apply route daily.    Marland Kitchen levothyroxine (SYNTHROID, LEVOTHROID) 75 MCG tablet TAKE 1 TABLET BY MOUTH DAILY 90 tablet 0  . liothyronine (CYTOMEL) 5 MCG tablet Take 2 tabs with levothyroxine in the morning 180 tablet 2  . loratadine (CLARITIN) 10 MG tablet Take 1 tablet (10 mg total) by mouth daily. 90 tablet 0  . venlafaxine XR (EFFEXOR-XR) 150 MG 24 hr capsule TAKE 1 CAPSULE BY MOUTH DAILY WITH BREAKFAST 90 capsule 1   No current facility-administered medications on file prior to visit.     PAST MEDICAL HISTORY: Past Medical History:  Diagnosis Date  . Abnormal mammogram of left breast 01/22/2016  . Anemia 11/21/2014   -with elevated platelets   . Anxiety   . Asthma   . Breast cyst 11/21/2014   -L breast, upper L just off from center -stable per radiology 10/2014   . Constipation   . Dyspnea   .  Fatty liver   . Gallbladder problem   . Gallstone pancreatitis 03/14/2016  . Generalized anxiety disorder 11/21/2014  . GERD (gastroesophageal reflux disease)   . Hypothyroidism   . IBS (irritable bowel syndrome)   . Menorrhagia 11/21/2014   -seeing gyn    . Obesity 11/21/2014  . Plantar fasciitis of left foot 11/21/2014  . Seasonal allergies 11/21/2014  . 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 Thyroid disease     PAST SURGICAL HISTORY: Past Surgical History:  Procedure Laterality Date  . ABDOMINAL HYSTERECTOMY    . BREAST BIOPSY    . BREAST EXCISIONAL BIOPSY    . BREAST LUMPECTOMY WITH RADIOACTIVE SEED LOCALIZATION Left 01/22/2016   Procedure: LEFT BREAST LUMPECTOMY WITH RADIOACTIVE SEED LOCALIZATION;  Surgeon: Fanny Skates, MD;  Location: [facility];  Service: General;  Laterality: Left;  . CESAREAN SECTION     x3  . CHOLECYSTECTOMY N/A 03/15/2016   Procedure: LAPAROSCOPIC CHOLECYSTECTOMY WITH INTRAOPERATIVE CHOLANGIOGRAM;  Surgeon: Jackolyn Confer, MD;  Location: [facility];  Service: General;  Laterality: N/A;  . CYSTO N/A  10/06/2015   Procedure: CYSTOSCOPY;  Surgeon: Nunzio Cobbs, MD;  Location: [facility];  Service: Gynecology;  Laterality: N/A;  . TUBAL LIGATION      SOCIAL HISTORY: Social History   Tobacco Use  . Smoking status: Former Smoker    Types: Cigarettes  . Smokeless tobacco: Never Used  Substance Use Topics  . Alcohol use: Yes    Alcohol/week: 1.0 standard drinks    Types: 1 Glasses of wine per week    Comment: socially  . Drug use: No    FAMILY HISTORY: Family History  Problem Relation Age of Onset  . Breast cancer Mother 66       s/p lump and rad  . Skin cancer Mother        non-melanoma  . Thyroid disease Mother   . Skin cancer Father        non-melanoma  . Hypertension Father   . Hyperlipidemia Father   . Breast cancer Paternal Grandmother        dx 43s; s/p mastectomy  .  Cervical cancer Maternal Grandmother   . Arthritis Maternal Grandmother   . CVA Maternal Grandmother   . Stroke Maternal Grandmother 80  . Heart attack Maternal Grandfather        d. 32s  . Colon cancer Paternal Grandfather 35  . Rectal cancer Maternal Uncle 54  . Aortic aneurysm Maternal Uncle 53  . Uterine cancer Other        maternal great grandmother (MGM's mother)  . Stroke Other   . Cancer Other        maternal great grandfather (MGM's father); NOS cancer  . Breast cancer Other        maternal great grandmother (MGF's mother)  . Colon cancer Other        maternal great grandfather (MGF's father)  . Heart attack Other   . Colon cancer Other        paternal great grandfather (PGF's father)  . Heart attack Other     ROS: Review of Systems  Cardiovascular: Negative for chest pain.  Gastrointestinal: Negative for nausea and vomiting.  Musculoskeletal: Positive for neck pain.       Negative for muscle weakness.  Psychiatric/Behavioral: Positive for depression.   PHYSICAL EXAM: Blood pressure 109/72, pulse 75, temperature 97.8 F (36.6 C), temperature source Oral, height 5' (1.524 m), weight 204 lb (92.5 kg), last menstrual period 09/26/2015, SpO2 97 %. Body mass index is 39.84 kg/m. Physical Exam Vitals signs reviewed.  Constitutional:      Appearance: Normal appearance. She is obese.  Cardiovascular:     Rate and Rhythm: Normal rate.  Pulmonary:     Effort: Pulmonary effort is normal.  Musculoskeletal: Normal range of motion.  Skin:    General: Skin is warm and dry.  Neurological:     Mental Status: She is alert and oriented to person, place, and time.  Psychiatric:        Mood and Affect: Mood normal.        Behavior: Behavior normal.    RECENT LABS AND TESTS: BMET    Component Value Date/Time   NA 140 04/23/2018 1135   NA 139 02/19/2015 0911   K 4.6 04/23/2018 1135   K 4.0 02/19/2015 0911   CL 103 04/23/2018 1135   CO2 22 04/23/2018 1135   CO2 23  02/19/2015 0911   GLUCOSE 83 04/23/2018 1135   GLUCOSE 78 03/24/2016 1433   GLUCOSE 96 02/19/2015 0911  BUN 13 04/23/2018 1135   BUN 11.5 02/19/2015 0911   CREATININE 0.67 04/23/2018 1135   CREATININE 0.8 02/19/2015 0911   CALCIUM 9.4 04/23/2018 1135   CALCIUM 9.6 02/19/2015 0911   GFRNONAA 104 04/23/2018 1135   GFRAA 120 04/23/2018 1135   Lab Results  Component Value Date   HGBA1C 4.9 04/23/2018   HGBA1C 5.1 11/30/2017   Lab Results  Component Value Date   INSULIN 12.3 04/23/2018   INSULIN 21.5 11/30/2017   CBC    Component Value Date/Time   WBC 8.5 04/23/2018 1135   WBC 8.9 03/29/2017 1428   RBC 4.78 04/23/2018 1135   RBC 4.41 03/29/2017 1428   HGB 14.1 04/23/2018 1135   HGB 12.8 02/19/2015 0911   HCT 43.0 04/23/2018 1135   HCT 38.3 02/19/2015 0911   PLT 399 (H) 11/30/2017 0934   MCV 90 04/23/2018 1135   MCV 83.6 02/19/2015 0911   MCH 29.5 04/23/2018 1135   MCH 28.7 03/17/2016 1048   MCHC 32.8 04/23/2018 1135   MCHC 33.5 03/29/2017 1428   RDW 12.7 04/23/2018 1135   RDW 17.0 (H) 02/19/2015 0911   LYMPHSABS 1.8 04/23/2018 1135   LYMPHSABS 2.0 02/19/2015 0911   MONOABS 0.6 03/29/2017 1428   MONOABS 0.7 02/19/2015 0911   EOSABS 0.2 04/23/2018 1135   BASOSABS 0.0 04/23/2018 1135   BASOSABS 0.1 02/19/2015 0911   Iron/TIBC/Ferritin/ %Sat    Component Value Date/Time   IRON 50 02/19/2015 0911   TIBC 359 02/19/2015 0911   FERRITIN 27 02/19/2015 0911   IRONPCTSAT 14 (L) 02/19/2015 0911   Lipid Panel     Component Value Date/Time   CHOL 205 (H) 04/23/2018 1135   TRIG 77 04/23/2018 1135   HDL 69 04/23/2018 1135   CHOLHDL 3.3 11/30/2017 0934   CHOLHDL 3 02/23/2015 1018   VLDL 30.2 02/23/2015 1018   LDLCALC 121 (H) 04/23/2018 1135   Hepatic Function Panel     Component Value Date/Time   PROT 6.9 04/23/2018 1135   PROT 6.9 02/19/2015 0911   ALBUMIN 4.5 04/23/2018 1135   ALBUMIN 3.5 02/19/2015 0911   AST 15 04/23/2018 1135   AST 13 02/19/2015 0911    ALT 19 04/23/2018 1135   ALT 14 02/19/2015 0911   ALKPHOS 91 04/23/2018 1135   ALKPHOS 84 02/19/2015 0911   BILITOT 0.3 04/23/2018 1135   BILITOT 0.23 02/19/2015 0911      Component Value Date/Time   TSH 2.03 03/15/2018 0902   TSH 4.75 (H) 11/08/2017 0854   TSH 4.30 06/15/2017 0940   Results for Maria [name], HILEY (MRN [id number]) as of 07/31/2018 13:26  Ref. Range 04/23/2018 11:35  Vitamin D, 25-Hydroxy Latest Ref Range: 30.0 - 100.0 ng/mL 34.7    OBESITY BEHAVIORAL INTERVENTION VISIT  Today's visit was # 12   Starting weight: 212 lbs Starting date: 11/30/17 Today's weight : Weight: 204 lb (92.5 kg)  Today's date: 07/30/2018 Total lbs lost to date: 8  ASK: We discussed the diagnosis of obesity with Morey Hummingbird Pirozzi today and Brycelyn agreed to give [name] permission to discuss obesity behavioral modification therapy today.  ASSESS: Michaline has the diagnosis of obesity and her BMI today is 39.8 Yorley is in the action stage of change.   ADVISE: Leydy was educated on the multiple health risks of obesity as well as the benefit of weight loss to improve her health. She was advised of the need for long term treatment and the importance of lifestyle modifications to improve her  current health and to decrease her risk of future health problems.  AGREE: Multiple dietary modification options and treatment options were discussed and Adiba agreed to follow the recommendations documented in the above note.  ARRANGE: Aanika was educated on the importance of frequent visits to treat obesity as outlined per CMS and USPSTF guidelines and agreed to schedule her next follow up appointment today.  I, Marcille Blanco, am acting as transcriptionist for Starlyn Skeans, MD  I have reviewed the above documentation for accuracy and completeness, and I agree with the above. -Dennard Nip, MD

## 2018-08-21 ENCOUNTER — Ambulatory Visit (INDEPENDENT_AMBULATORY_CARE_PROVIDER_SITE_OTHER): Payer: Self-pay | Admitting: Nurse Practitioner

## 2018-08-21 VITALS — BP 120/80 | HR 104 | Temp 98.3°F | Resp 18 | Wt 206.0 lb

## 2018-08-21 DIAGNOSIS — R6889 Other general symptoms and signs: Secondary | ICD-10-CM

## 2018-08-21 LAB — POCT INFLUENZA A/B
INFLUENZA A, POC: NEGATIVE
Influenza B, POC: NEGATIVE

## 2018-08-21 MED ORDER — ALBUTEROL SULFATE HFA 108 (90 BASE) MCG/ACT IN AERS: 2.0000 | INHALATION_SPRAY | Freq: Four times a day (QID) | RESPIRATORY_TRACT | 0 refills | Status: DC | PRN

## 2018-08-21 MED ORDER — OSELTAMIVIR PHOSPHATE 75 MG PO CAPS: 75.0000 mg | ORAL_CAPSULE | Freq: Two times a day (BID) | ORAL | 0 refills | Status: AC

## 2018-08-21 MED ORDER — PSEUDOEPH-BROMPHEN-DM 30-2-10 MG/5ML PO SYRP: 5.0000 mL | ORAL_SOLUTION | Freq: Four times a day (QID) | ORAL | 0 refills | Status: DC | PRN

## 2018-08-21 MED ORDER — PROMETHAZINE-DM 6.25-15 MG/5ML PO SYRP: 5.0000 mL | ORAL_SOLUTION | Freq: Four times a day (QID) | ORAL | 0 refills | Status: AC | PRN

## 2018-08-21 NOTE — Progress Notes (Signed)
Subjective:     Maria [name] is a 48 y.o. female who presents for evaluation of influenza like symptoms. Symptoms include suspected fevers but not measured at home, chills, dry cough, headache, myalgias, sore throat and nasal congestion, diarrhea and nausea and have been present for 2 days. She has tried to alleviate the symptoms with OTC cough and cold medicine, albuterol inhaler with minimal relief. High risk factors for influenza complications: co-morbid illness. Patient has a history of asthma.  Patient has not received an influenza vaccine this flu season.  Patient further informs she has been using her albuterol inhaler more frequently over the past 2 days.  The following portions of the patient's history were reviewed and updated as appropriate: allergies, current medications and past medical history.  Review of Systems Constitutional: positive for anorexia, chills, fatigue, fevers and malaise, negative for sweats and weight loss Eyes: negative Ears, nose, mouth, throat, and face: positive for nasal congestion and sore throat, negative for ear drainage and earaches Respiratory: positive for asthma, cough and wheezing, negative for emphysema, hemoptysis and pleurisy/chest pain Cardiovascular: negative Gastrointestinal: positive for diarrhea and nausea, negative for abdominal pain and vomiting Neurological: positive for headaches, negative for coordination problems, dizziness, paresthesia, vertigo and weakness     Objective:    BP 120/80 (BP Location: Right Arm, Patient Position: Sitting, Cuff Size: Normal)   Pulse (!) 104   Temp 98.3 F (36.8 C) (Oral)   Resp 18   Wt 206 lb (93.4 kg)   LMP 09/26/2015 (Exact Date)   SpO2 97%   BMI 40.23 kg/m  General appearance: alert, cooperative, fatigued and patient appears uncomfortable Head: Normocephalic, without obvious abnormality, atraumatic Eyes: conjunctivae/corneas clear. PERRL, EOM's intact. Fundi benign. Ears: normal TM's and  external ear canals both ears Nose: no discharge, moderate congestion, turbinates swollen, inflamed, mild maxillary sinus tenderness bilateral, no frontal sinus tenderness bilateral Throat: lips, mucosa, and tongue normal; teeth and gums normal Lungs: clear to auscultation bilaterally Heart: regular rate and rhythm, S1, S2 normal, no murmur, click, rub or gallop Abdomen: soft, non-tender; bowel sounds normal; no masses,  no organomegaly Pulses: 2+ and symmetric Skin: Skin color, texture, turgor normal. No rashes or lesions Lymph nodes: cervical and submandibular nodes normal Neurologic: Grossly normal    Assessment and Plan:    Influenza like syndrome    Exam findings, diagnosis etiology and medication use and indications reviewed with patient. Follow- Up and discharge instructions provided. No emergent/urgent issues found on exam.  Based on the patient's clinical symptoms, clinical presentation, duration of symptoms, symptoms are congruent with influenza-like symptoms.  Despite negative flu test, patient is high risk for developing influenza due to her chronic history of asthma.  We will go ahead and prescribe Tamiflu, and symptomatic treatment to include cough medicine and an albuterol inhaler to help with her symptoms.  Directed patient that if she develops difficulty breathing, shortness of breath, or worsening symptoms to follow-up in the emergency department.  Patient was also informed to return to our office for an influenza vaccine once her symptoms resolved.  Patient education was provided. Patient verbalized understanding of information provided and agrees with plan of care (POC), all questions answered. The patient is advised to call or return to clinic if condition does not see an improvement in symptoms, or to seek the care of the closest emergency department if condition worsens with the above plan.    1. Flu-like symptoms  - POCT Influenza A/B  2. Influenza-like symptoms  -  brompheniramine-pseudoephedrine-DM 30-2-10 MG/5ML syrup; Take 5 mLs by mouth 4 (four) times daily as needed for up to 7 days.  Dispense: 150 mL; Refill: 0 - promethazine-dextromethorphan (PROMETHAZINE-DM) 6.25-15 MG/5ML syrup; Take 5 mLs by mouth 4 (four) times daily as needed for up to 7 days for cough.  Dispense: 140 mL; Refill: 0 - oseltamivir (TAMIFLU) 75 MG capsule; Take 1 capsule (75 mg total) by mouth 2 (two) times daily for 5 days.  Dispense: 10 capsule; Refill: 0 - albuterol (PROVENTIL HFA;VENTOLIN HFA) 108 (90 Base) MCG/ACT inhaler; Inhale 2 puffs into the lungs every 6 (six) hours as needed for wheezing or shortness of breath.  Dispense: 1 Inhaler; Refill: 0 -Take medication as prescribed. -Symptomatic treatment is recommended to include Ibuprofen or Tylenol for pain, fever, or general discomfort. -Increase fluids. -Get plenty of rest. -May use a teaspoon of honey or over-the-counter cough drops to help with throat pain -Warm saltwater gargles 3-4 times daily for throat pain. -Sleep elevated on 2 pillows at bedtime. -Use a humidifier or vaporizer during sleep. -Remain home until fever free for 24 hours. -Follow-up if symptoms do not improve. -Work note provided.

## 2018-08-21 NOTE — Patient Instructions (Signed)
Influenza-Like Symptoms, Adult -Take medication as prescribed. -Symptomatic treatment is recommended to include Ibuprofen or Tylenol for pain, fever, or general discomfort. -Increase fluids. -Get plenty of rest. -May use a teaspoon of honey or over-the-counter cough drops to help with throat pain -Warm saltwater gargles 3-4 times daily for throat pain. -Sleep elevated on 2 pillows at bedtime. -Use a humidifier or vaporizer during sleep. -Remain home until fever free for 24 hours. -Follow-up if symptoms do not improve. -Work note provided.   Influenza, more commonly known as "the flu," is a viral infection that mainly affects the respiratory tract. The respiratory tract includes organs that help you breathe, such as the lungs, nose, and throat. The flu causes many symptoms similar to the common cold along with high fever and body aches. The flu spreads easily from person to person (is contagious). Getting a flu shot (influenza vaccination) every year is the best way to prevent the flu. What are the causes? This condition is caused by the influenza virus. You can get the virus by:  Breathing in droplets that are in the air from an infected person's cough or sneeze.  Touching something that has been exposed to the virus (has been contaminated) and then touching your mouth, nose, or eyes. What increases the risk? The following factors may make you more likely to get the flu:  Not washing or sanitizing your hands often.  Having close contact with many people during cold and flu season.  Touching your mouth, eyes, or nose without first washing or sanitizing your hands.  Not getting a yearly (annual) flu shot. You may have a higher risk for the flu, including serious problems such as a lung infection (pneumonia), if you:  Are older than 65.  Are pregnant.  Have a weakened disease-fighting system (immune system). You may have a weakened immune system if you: ? Have HIV or AIDS. ? Are  undergoing chemotherapy. ? Are taking medicines that reduce (suppress) the activity of your immune system.  Have a long-term (chronic) illness, such as heart disease, kidney disease, diabetes, or lung disease.  Have a liver disorder.  Are severely overweight (morbidly obese).  Have anemia. This is a condition that affects your red blood cells.  Have asthma. What are the signs or symptoms? Symptoms of this condition usually begin suddenly and last 4-14 days. They may include:  Fever and chills.  Headaches, body aches, or muscle aches.  Sore throat.  Cough.  Runny or stuffy (congested) nose.  Chest discomfort.  Poor appetite.  Weakness or fatigue.  Dizziness.  Nausea or vomiting. How is this diagnosed? This condition may be diagnosed based on:  Your symptoms and medical history.  A physical exam.  Swabbing your nose or throat and testing the fluid for the influenza virus. How is this treated? If the flu is diagnosed early, you can be treated with medicine that can help reduce how severe the illness is and how long it lasts (antiviral medicine). This may be given by mouth (orally) or through an IV. Taking care of yourself at home can help relieve symptoms. Your health care provider may recommend:  Taking over-the-counter medicines.  Drinking plenty of fluids. In many cases, the flu goes away on its own. If you have severe symptoms or complications, you may be treated in a hospital. Follow these instructions at home: Activity  Rest as needed and get plenty of sleep.  Stay home from work or school as told by your health care provider. Unless  you are visiting your health care provider, avoid leaving home until your fever has been gone for 24 hours without taking medicine. Eating and drinking  Take an oral rehydration solution (ORS). This is a drink that is sold at pharmacies and retail stores.  Drink enough fluid to keep your urine pale yellow.  Drink clear  fluids in small amounts as you are able. Clear fluids include water, ice chips, diluted fruit juice, and low-calorie sports drinks.  Eat bland, easy-to-digest foods in small amounts as you are able. These foods include bananas, applesauce, rice, lean meats, toast, and crackers.  Avoid drinking fluids that contain a lot of sugar or caffeine, such as energy drinks, regular sports drinks, and soda.  Avoid alcohol.  Avoid spicy or fatty foods. General instructions      Take over-the-counter and prescription medicines only as told by your health care provider.  Use a cool mist humidifier to add humidity to the air in your home. This can make it easier to breathe.  Cover your mouth and nose when you cough or sneeze.  Wash your hands with soap and water often, especially after you cough or sneeze. If soap and water are not available, use alcohol-based hand sanitizer.  Keep all follow-up visits as told by your health care provider. This is important. How is this prevented?   Get an annual flu shot. You may get the flu shot in late summer, fall, or winter. Ask your health care provider when you should get your flu shot.  Avoid contact with people who are sick during cold and flu season. This is generally fall and winter. Contact a health care provider if:  You develop new symptoms.  You have: ? Chest pain. ? Diarrhea. ? A fever.  Your cough gets worse.  You produce more mucus.  You feel nauseous or you vomit. Get help right away if:  You develop shortness of breath or difficulty breathing.  Your skin or nails turn a bluish color.  You have severe pain or stiffness in your neck.  You develop a sudden headache or sudden pain in your face or ear.  You cannot eat or drink without vomiting. Summary  Influenza, more commonly known as "the flu," is a viral infection that primarily affects your respiratory tract.  Symptoms of the flu usually begin suddenly and last 4-14  days.  Getting an annual flu shot is the best way to prevent getting the flu.  Stay home from work or school as told by your health care provider. Unless you are visiting your health care provider, avoid leaving home until your fever has been gone for 24 hours without taking medicine.  Keep all follow-up visits as told by your health care provider. This is important. This information is not intended to replace advice given to you by your health care provider. Make sure you discuss any questions you have with your health care provider. Document Released: 07/29/2000 Document Revised: 01/17/2018 Document Reviewed: 01/17/2018 Elsevier Interactive Patient Education  2019 [employer] Choices to Help Relieve Diarrhea, Adult When you have diarrhea, the foods you eat and your eating habits are very important. Choosing the right foods and drinks can help:  Relieve diarrhea.  Replace lost fluids and nutrients.  Prevent dehydration. What general guidelines should I follow?  Relieving diarrhea  Choose foods with less than 2 g or .07 oz. of fiber per serving.  Limit fats to less than 8 tsp (38 g or 1.34 oz.) a  day.  Avoid the following: ? Foods and beverages sweetened with high-fructose corn syrup, honey, or sugar alcohols such as xylitol, sorbitol, and mannitol. ? Foods that contain a lot of fat or sugar. ? Fried, greasy, or spicy foods. ? High-fiber grains, breads, and cereals. ? Raw fruits and vegetables.  Eat foods that are rich in probiotics. These foods include dairy products such as yogurt and fermented milk products. They help increase healthy bacteria in the stomach and intestines (gastrointestinal tract, or GI tract).  If you have lactose intolerance, avoid dairy products. These may make your diarrhea worse.  Take medicine to help stop diarrhea (antidiarrheal medicine) only as told by your health care provider. Replacing nutrients  Eat small meals or snacks every 3-4  hours.  Eat bland foods, such as white rice, toast, or baked potato, until your diarrhea starts to get better. Gradually reintroduce nutrient-rich foods as tolerated or as told by your health care provider. This includes: ? Well-cooked protein foods. ? Peeled, seeded, and soft-cooked fruits and vegetables. ? Low-fat dairy products.  Take vitamin and mineral supplements as told by your health care provider. Preventing dehydration  Start by sipping water or a special solution to prevent dehydration (oral rehydration solution, ORS). Urine that is clear or pale yellow means that you are getting enough fluid.  Try to drink at least 8-10 cups of fluid each day to help replace lost fluids.  You may add other liquids in addition to water, such as clear juice or decaffeinated sports drinks, as tolerated or as told by your health care provider.  Avoid drinks with caffeine, such as coffee, tea, or soft drinks.  Avoid alcohol. What foods are recommended?     The items listed may not be a complete list. Talk with your health care provider about what dietary choices are best for you. Grains White rice. White, [place], or pita breads (fresh or toasted), including plain rolls, buns, or bagels. White pasta. Saltine, soda, or graham crackers. Pretzels. Low-fiber cereal. Cooked cereals made with water (such as cornmeal, farina, or cream cereals). Plain muffins. Matzo. Melba toast. Zwieback. Vegetables Potatoes (without the skin). Most well-cooked and canned vegetables without skins or seeds. Tender lettuce. Fruits Apple sauce. Fruits canned in juice. Cooked apricots, cherries, grapefruit, peaches, pears, or plums. Fresh bananas and cantaloupe. Meats and other protein foods Baked or boiled chicken. Eggs. Tofu. Fish. Seafood. Smooth nut butters. Ground or well-cooked tender beef, ham, veal, lamb, pork, or poultry. Dairy Plain yogurt, kefir, and unsweetened liquid yogurt. Lactose-free milk, buttermilk,  skim milk, or soy milk. Low-fat or nonfat hard cheese. Beverages Water. Low-calorie sports drinks. Fruit juices without pulp. Strained tomato and vegetable juices. Decaffeinated teas. Sugar-free beverages not sweetened with sugar alcohols. Oral rehydration solutions, if approved by your health care provider. Seasoning and other foods Bouillon, broth, or soups made from recommended foods. What foods are not recommended? The items listed may not be a complete list. Talk with your health care provider about what dietary choices are best for you. Grains Whole grain, whole wheat, bran, or rye breads, rolls, pastas, and crackers. Wild or brown rice. Whole grain or bran cereals. Barley. Oats and oatmeal. Corn tortillas or taco shells. Granola. Popcorn. Vegetables Raw vegetables. Fried vegetables. Cabbage, broccoli, Brussels sprouts, artichokes, baked beans, beet greens, corn, kale, legumes, peas, sweet potatoes, and yams. Potato skins. Cooked spinach and cabbage. Fruits Dried fruit, including raisins and dates. Raw fruits. Stewed or dried prunes. Canned fruits with syrup. Meat and other  protein foods Fried or fatty meats. Deli meats. Chunky nut butters. Nuts and seeds. Beans and lentils. Berniece Salines. Hot dogs. Sausage. Dairy High-fat cheeses. Whole milk, chocolate milk, and beverages made with milk, such as milk shakes. Half-and-half. Cream. sour cream. Ice cream. Beverages Caffeinated beverages (such as coffee, tea, soda, or energy drinks). Alcoholic beverages. Fruit juices with pulp. Prune juice. Soft drinks sweetened with high-fructose corn syrup or sugar alcohols. High-calorie sports drinks. Fats and oils Butter. Cream sauces. Margarine. Salad oils. Plain salad dressings. Olives. Avocados. Mayonnaise. Sweets and desserts Sweet rolls, doughnuts, and sweet breads. Sugar-free desserts sweetened with sugar alcohols such as xylitol and sorbitol. Seasoning and other foods Honey. Hot sauce. Chili powder.  Gravy. Cream-based or milk-based soups. Pancakes and waffles. Summary  When you have diarrhea, the foods you eat and your eating habits are very important.  Make sure you get at least 8-10 cups of fluid each day, or enough to keep your urine clear or pale yellow.  Eat bland foods and gradually reintroduce healthy, nutrient-rich foods as tolerated, or as told by your health care provider.  Avoid high-fiber, fried, greasy, or spicy foods. This information is not intended to replace advice given to you by your health care provider. Make sure you discuss any questions you have with your health care provider. Document Released: 10/22/2003 Document Revised: 07/29/2016 Document Reviewed: 07/29/2016 Elsevier Interactive Patient Education  2019 [employer].

## 2018-08-24 ENCOUNTER — Other Ambulatory Visit (INDEPENDENT_AMBULATORY_CARE_PROVIDER_SITE_OTHER): Payer: Self-pay | Admitting: Family Medicine

## 2018-08-24 DIAGNOSIS — F3289 Other specified depressive episodes: Secondary | ICD-10-CM

## 2018-08-25 ENCOUNTER — Ambulatory Visit (INDEPENDENT_AMBULATORY_CARE_PROVIDER_SITE_OTHER): Payer: Self-pay | Admitting: Physician Assistant

## 2018-08-25 ENCOUNTER — Ambulatory Visit (HOSPITAL_COMMUNITY)
Admission: EM | Admit: 2018-08-25 | Discharge: 2018-08-25 | Disposition: A | Discharge status: Home / Self Care | Payer: No Typology Code available for payment source | Attending: Family Medicine | Admitting: Family Medicine

## 2018-08-25 ENCOUNTER — Encounter (HOSPITAL_COMMUNITY): Payer: Self-pay

## 2018-08-25 ENCOUNTER — Ambulatory Visit (INDEPENDENT_AMBULATORY_CARE_PROVIDER_SITE_OTHER): Payer: No Typology Code available for payment source

## 2018-08-25 VITALS — BP 126/80 | HR 89 | Temp 98.3°F | Resp 18 | Wt 207.8 lb

## 2018-08-25 DIAGNOSIS — J209 Acute bronchitis, unspecified: Secondary | ICD-10-CM

## 2018-08-25 DIAGNOSIS — R05 Cough: Secondary | ICD-10-CM

## 2018-08-25 DIAGNOSIS — R0602 Shortness of breath: Secondary | ICD-10-CM

## 2018-08-25 DIAGNOSIS — R059 Cough, unspecified: Secondary | ICD-10-CM

## 2018-08-25 MED ORDER — BENZONATATE 200 MG PO CAPS: 200.0000 mg | ORAL_CAPSULE | Freq: Three times a day (TID) | ORAL | 0 refills | Status: DC | PRN

## 2018-08-25 MED ORDER — IPRATROPIUM-ALBUTEROL 0.5-2.5 (3) MG/3ML IN SOLN
RESPIRATORY_TRACT | Status: AC
  Filled 2018-08-25: qty 3

## 2018-08-25 MED ORDER — GUAIFENESIN-CODEINE 100-10 MG/5ML PO SOLN: 10.0000 mL | Freq: Four times a day (QID) | ORAL | 0 refills | Status: DC | PRN

## 2018-08-25 MED ORDER — PREDNISONE 20 MG PO TABS: 40.0000 mg | ORAL_TABLET | Freq: Every day | ORAL | 0 refills | Status: DC

## 2018-08-25 MED ORDER — IPRATROPIUM-ALBUTEROL 0.5-2.5 (3) MG/3ML IN SOLN
3.0000 mL | Freq: Once | RESPIRATORY_TRACT | Status: AC
  Administered 2018-08-25: 3 mL via RESPIRATORY_TRACT

## 2018-08-25 MED ORDER — AZITHROMYCIN 250 MG PO TABS: ORAL_TABLET | ORAL | 0 refills | Status: DC

## 2018-08-25 MED ORDER — ALBUTEROL SULFATE (2.5 MG/3ML) 0.083% IN NEBU: 2.5000 mg | INHALATION_SOLUTION | Freq: Four times a day (QID) | RESPIRATORY_TRACT | 0 refills | Status: DC | PRN

## 2018-08-25 NOTE — ED Provider Notes (Signed)
Patient: Maria [name] MRN: [id number] DOB: 1970/10/03 PCP: Lucretia Kern, DO     Subjective:  Chief Complaint  Patient presents with  . Poss. Pneumonia    HPI: The patient is a 48 y.o. female who presents today for possible pneumonia. Was seen at [facility] today and referred her for CXR. She was seen last Tuesday and was treated for the flu even though negative flu test. She was started on tamiflu and has one pill left. She states she has progressively gotten worse each day. She has a terrible cough that is productive in nature and hurts. She has shortness of breath and wheezing and tightness.  She is in pain everywhere and her head/neck are hurting her. No fevers over the past 5+days. She has pain with coughing. She was given an inhaler and bromfed cough syrup. She has no known sick contacts. 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 She has been nauseated and thrown up twice today, but has not really had this problem until today. She did have vomiting and diarrhea on Monday, but none since that time. She has been using flonase. She has a history of asthma and has been using her inhaler "24/7." Doesn't feel a full blown flair, but she is tight.   Review of Systems  Constitutional: Positive for chills and diaphoresis. Negative for fever.  HENT: Positive for sinus pressure and sinus pain. Negative for congestion, ear pain and sore throat.   Respiratory: Positive for cough, chest tightness, shortness of breath and wheezing.   Cardiovascular: Negative for chest pain.  Gastrointestinal: Positive for nausea and vomiting. Negative for abdominal pain and diarrhea.  Musculoskeletal: Positive for myalgias and neck pain.  Neurological: Positive for headaches.    Allergies Patient has No Known Allergies.  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  Surgical History Patient  has a past surgical history that includes Cesarean section; Tubal ligation; Cysto (N/A, 10/06/2015); Abdominal hysterectomy; Breast lumpectomy with radioactive seed localization (Left, 01/22/2016); Cholecystectomy (N/A, 03/15/2016); Breast biopsy; and Breast excisional biopsy.  Family History Pateint's family history includes Aortic aneurysm (age of onset: 42) in her maternal uncle; Arthritis in her maternal grandmother; Breast cancer in her paternal grandmother and another family member; Breast cancer (age of onset: 84) in her mother; CVA in her maternal grandmother; Cancer in an other family member; Cervical cancer in her maternal grandmother; Colon cancer in some other family members; Colon cancer (age of onset: 35) in her paternal grandfather; Heart attack in her maternal grandfather and other family members; Hyperlipidemia in her father; Hypertension in her father; Rectal cancer (age of onset: 98) in her maternal uncle; Skin cancer in her father and mother; Stroke in an other family member; Stroke (age of onset: 65) in her maternal grandmother; Thyroid disease in her mother; Uterine cancer in an other family member.  Social History Patient  reports that she has quit smoking. Her smoking use included cigarettes. She has never used smokeless tobacco. She reports current alcohol use of about 1.0 standard drinks of alcohol per week. She reports that she does not use drugs.    Objective:  Vitals:   08/25/18 1523  BP: (!) 142/99  Pulse: 92  Resp: 16  Temp:  98.3 F (36.8 C)  TempSrc: Oral  SpO2: 95%    There is no height or weight on file to calculate BMI.  Physical Exam Vitals signs reviewed.  Constitutional:      Appearance: She is obese.  HENT:     Head:     Comments: TTp over sinuses     Right Ear: Tympanic membrane, ear canal and external ear normal.     Left Ear: Tympanic membrane, ear canal and external ear normal.     Nose: Nose normal.     Mouth/Throat:     Pharynx: No oropharyngeal exudate.  Neck:     Musculoskeletal: Normal range of motion.  Cardiovascular:     Rate and Rhythm: Normal rate and regular rhythm.     Heart sounds: Normal heart sounds.  Pulmonary:     Effort: Pulmonary effort is normal. No respiratory distress.     Breath sounds: Normal breath sounds. No wheezing or rales.  Abdominal:     General: Abdomen is flat. Bowel sounds are normal.     Palpations: Abdomen is soft.  Lymphadenopathy:     Cervical: No cervical adenopathy.  Skin:    Capillary Refill: Capillary refill takes less than 2 seconds.  Neurological:     General: No focal deficit present.     Mental Status: She is alert and oriented to person, place, and time.       CXR: no acute consolidation.  duoneb given. Feels better after neb. Lungs clear as before.   Assessment/plan: The encounter diagnosis was Acute bronchitis, unspecified organism. -prolonged and worsening symptoms. CXR negative, but will give zpack and treat asthma flair with nebs, steroids. Trial of tessalon pearls and conservative use of codeine cough syrup since she has asthma. Has used before with no issue. If not getting better she needs to see Dr. Maudie Mercury. Also discussed conservative therapy with honey, cool mist humidifier and that bronchitis can last up to 6 weeks of coughing.     Orma Flaming, MD  08/25/2018    Orma Flaming, MD 08/25/18 [phone]

## 2018-08-25 NOTE — Discharge Instructions (Addendum)
Honey, cool mist humidifier, continue flonase.   -sent in zpack, steroid burst x 5 days and nebulizer.  Also tessalon pearls prn for cough and codeine cough syrup.

## 2018-08-25 NOTE — ED Triage Notes (Signed)
Pt presents with deep cough with colored mucus, chest tightness, back pain, shortness of breath, generalized body aches and headache X 7 days.  Pt was previously treated for flu but was referred here by [facility] for [facility].

## 2018-08-25 NOTE — Progress Notes (Signed)
MRN: [id number] DOB: [birth year]  Subjective:   Maria [name] is a 48 y.o. female presenting for chief complaint of follow up( symptoms worsen) . 1 week history of worsening productive cough, myalgias, nasal congestion, and headache.  Was seen here 4 days ago and treated empirically for flu with Tamiflu.  Fever has resolved.  Also given symptomatic treatment of cough syrup.  Has 2 doses of Tamiflu left.  Has been using cough syrup as prescribed.  Has not had any improvement in symptoms and cough has worsened.  She is now having shortness of breath, dyspnea on exertion, and chest tightness when coughing.  Has past medical history of mild asthma, uses albuterol inhaler as needed, no maintenance inhaler.  She has been using her albuterol inhaler without any relief for the past few days. Denies exertional chest pain, chest pain at rest, diaphoresis, nausea, vomiting, lower leg swelling, orthopnea, sore throat, abdominal pain, dizziness or lightheadedness. No recent surgery/travel/immobilization, hx of cancer, leg swelling, hemoptysis, prior DVT/PE, or hormone use.  Smokes cigarettes occasionally when she goes out. Denies any other aggravating or relieving factors, no other questions or concerns.  Review of Systems  Respiratory: Positive for sputum production. Negative for hemoptysis.   Gastrointestinal: Negative for diarrhea.  Genitourinary: Negative for dysuria, flank pain, frequency, hematuria and urgency.    Maria [name] has a current medication list which includes the following prescription(s): albuterol, albuterol, brompheniramine-pseudoephedrine-dm, bupropion, cyclobenzaprine, pen needles 31gx5/16", levothyroxine, liothyronine, liraglutide -weight management, loratadine, oseltamivir, promethazine-dextromethorphan, pseudoeph-doxylamine-dm-apap, pseudoephedrine-apap-dm, venlafaxine xr, and vitamin d (ergocalciferol). Also has No Known Allergies.  Maria [name]  has a past medical history of Abnormal mammogram of  left breast (01/22/2016), Anemia (11/21/2014), Anxiety, Asthma, Breast cyst (11/21/2014), Constipation, Dyspnea, Fatty liver, Gallbladder problem, Gallstone pancreatitis (03/14/2016), Generalized anxiety disorder (11/21/2014), GERD (gastroesophageal reflux disease), Hypothyroidism, IBS (irritable bowel syndrome), Menorrhagia (11/21/2014), Obesity (11/21/2014), Plantar fasciitis of left foot (11/21/2014), Seasonal allergies (11/21/2014), Status post total abdominal hysterectomy and bilateral salpingo-oophorectomy (10/06/2015), and Thyroid disease. Also  has a past surgical history that includes Cesarean section; Tubal ligation; Cysto (N/A, 10/06/2015); Abdominal hysterectomy; Breast lumpectomy with radioactive seed localization (Left, 01/22/2016); Cholecystectomy (N/A, 03/15/2016); Breast biopsy; and Breast excisional biopsy.   Objective:   Vitals: BP 126/80 (BP Location: Right Arm, Patient Position: Sitting, Cuff Size: Large)   Pulse 89   Temp 98.3 F (36.8 C) (Oral)   Resp 18   Wt 207 lb 12.8 oz (94.3 kg)   LMP 09/26/2015 (Exact Date)   SpO2 96%   BMI 40.58 kg/m   Physical Exam Vitals signs reviewed.  Constitutional:      General: She is not in acute distress.    Appearance: She is well-developed. She is not ill-appearing, toxic-appearing or diaphoretic.     Comments: Frequent coughing noted during exam.  HENT:     Head: Normocephalic and atraumatic.     Right Ear: Tympanic membrane, ear canal and external ear normal.     Left Ear: Tympanic membrane, ear canal and external ear normal.     Nose: Mucosal edema and congestion present.     Right Sinus: No maxillary sinus tenderness or frontal sinus tenderness.     Left Sinus: No maxillary sinus tenderness or frontal sinus tenderness.     Mouth/Throat:     Lips: Pink.     Mouth: Mucous membranes are moist.     Pharynx: Uvula midline. Posterior oropharyngeal erythema present.     Tonsils: No tonsillar exudate or tonsillar abscesses.  Eyes:  Conjunctiva/sclera: Conjunctivae normal.  Neck:     Musculoskeletal: Normal range of motion.  Cardiovascular:     Rate and Rhythm: Normal rate and regular rhythm.     Heart sounds: Normal heart sounds.  Pulmonary:     Effort: Pulmonary effort is normal.     Breath sounds: Normal breath sounds. No decreased breath sounds, wheezing, rhonchi or rales.     Comments: Difficult lung exam due to large body habitus and frequent coughing. Musculoskeletal:     Right lower leg: No edema.     Left lower leg: No edema.  Lymphadenopathy:     Head:     Right side of head: No submental, submandibular, tonsillar, preauricular, posterior auricular or occipital adenopathy.     Left side of head: No submental, submandibular, tonsillar, preauricular, posterior auricular or occipital adenopathy.     Cervical: No cervical adenopathy.     Upper Body:     Right upper body: No supraclavicular adenopathy.     Left upper body: No supraclavicular adenopathy.  Skin:    General: Skin is warm and dry.  Neurological:     Mental Status: She is alert and oriented to person, place, and time.     No results found for this or any previous visit (from the past 24 hour(s)).  Assessment and Plan :  1. Cough 2. Shortness of breath Patient is overall well-appearing, no acute distress.  No tachypnea.  Afebrile.  SPO2 96%.  Was initially evaluated at [facility] and treated empirically for influenza, despite negative influenza test, on 08/21/2018.  Since then she has not had any improvement in symptoms.  Cough has worsened.  She is not having any relief with albuterol inhaler or symptomatic management.  Lungs are CTAB, no wheezes, rhonchi, rale.  Due to her history, worsening of symptoms, and lung exam, patient warrants higher level of care.  Discussed limitations of our office in terms of labs and diagnostic imaging.  She will likely benefit from chest x-ray at this time.  Rec she seek care at local urgent care for further  evaluation and management.  Patient agrees to plan.  Tenna Delaine, Hershal Coria  [facility] 08/25/2018 2:02 PM

## 2018-08-25 NOTE — Patient Instructions (Signed)
I recommend going to urgent care for further evaluation and management as you will likely need a chest xray.

## 2018-08-28 ENCOUNTER — Ambulatory Visit (INDEPENDENT_AMBULATORY_CARE_PROVIDER_SITE_OTHER): Payer: No Typology Code available for payment source | Admitting: Family Medicine

## 2018-08-28 ENCOUNTER — Encounter (INDEPENDENT_AMBULATORY_CARE_PROVIDER_SITE_OTHER): Payer: Self-pay | Admitting: Family Medicine

## 2018-08-28 VITALS — BP 127/69 | HR 85 | Temp 98.0°F | Ht 60.0 in | Wt 203.0 lb

## 2018-08-28 DIAGNOSIS — E559 Vitamin D deficiency, unspecified: Secondary | ICD-10-CM | POA: Diagnosis not present

## 2018-08-28 DIAGNOSIS — Z9189 Other specified personal risk factors, not elsewhere classified: Secondary | ICD-10-CM

## 2018-08-28 DIAGNOSIS — F3289 Other specified depressive episodes: Secondary | ICD-10-CM | POA: Diagnosis not present

## 2018-08-28 DIAGNOSIS — Z6839 Body mass index (BMI) 39.0-39.9, adult: Secondary | ICD-10-CM

## 2018-08-28 MED ORDER — BUPROPION HCL ER (SR) 200 MG PO TB12: 200.0000 mg | ORAL_TABLET | Freq: Every day | ORAL | 0 refills | Status: DC

## 2018-08-28 MED ORDER — VITAMIN D (ERGOCALCIFEROL) 1.25 MG (50000 UNIT) PO CAPS: 50000.0000 [IU] | ORAL_CAPSULE | ORAL | 0 refills | Status: DC

## 2018-08-29 NOTE — Progress Notes (Signed)
Office: [phone]  /  Fax: [phone]   HPI:   Chief Complaint: OBESITY Maria [name] is here to discuss her progress with her obesity treatment plan. She is keeping a food journal with 1300 to 1500 calories and 80+ grams of protein  and is following her eating plan approximately 50 % of the time. She states she is exercising 0 minutes 0 times per week. [name] did well maintaining weight over the holidays, but she feels it is due to her recent flu and bronchitis. She is frustrated at how slow her weight loss has been, but is ready to get back on track.  Her weight is 203 lb (92.1 kg) today and has had a weight loss of 1 pound over a period of 4 weeks since her last visit. She has lost 9 lbs since starting treatment with [name].  Vitamin D deficiency [name] has a diagnosis of vitamin D deficiency. She is currently stable on vit D. She had labs in December, but they are not in [place].  At risk for osteopenia and osteoporosis [name] is at higher risk of osteopenia and osteoporosis due to vitamin D deficiency.   Depression with emotional eating behaviors [name]'s mood and blood pressure are stable and she is struggling with emotional eating and using food for comfort to the extent that it is negatively impacting her health. She often snacks when she is not hungry. [name] sometimes feels she is out of control and then feels guilty that she made poor food choices. She has been working on behavior modification techniques to help reduce her emotional eating and has been somewhat successful.   ASSESSMENT AND PLAN:  Vitamin D deficiency - Plan: Vitamin D, Ergocalciferol, (DRISDOL) 1.25 MG (50000 UT) CAPS capsule  Other depression - with emotional eating  - Plan: buPROPion (WELLBUTRIN SR) 200 MG 12 hr tablet  At risk for osteoporosis  Class 2 severe obesity with serious comorbidity and body mass index (BMI) of 39.0 to 39.9 in adult, unspecified obesity type ([place])  PLAN:  Vitamin D Deficiency [name]  was informed that low vitamin D levels contributes to fatigue and are associated with obesity, breast, and colon cancer. She agrees to continue to take prescription Vit D @50 ,000 IU every week #4 with no refills and will follow up for routine testing of vitamin D, at least 2-3 times per year. She was informed of the risk of over-replacement of vitamin D and agrees to not increase her dose unless she discusses this with [name] first. [name] agrees to follow up in 3 weeks.  At risk for osteopenia and osteoporosis [name] was given extended (15 minutes) osteoporosis prevention counseling today. [name] is at risk for osteopenia and osteoporosis due to her vitamin D deficiency. She was encouraged to take her vitamin D and follow her higher calcium diet and increase strengthening exercise to help strengthen her bones and decrease her risk of osteopenia and osteoporosis.  Depression with Emotional Eating Behaviors We discussed behavior modification techniques today to help [name] deal with her emotional eating and depression. She has agreed to continue to take Wellbutrin SR 200mg  qd #30 with no refills and agreed to follow up as directed.  Obesity [name] is currently in the action stage of change. As such, her goal is to continue with weight loss efforts. She has agreed to keep a food journal with 1300 to 1500 calories and 80+ grams of protein.  [name] has been instructed to work up to a goal of 150 minutes of combined cardio  and strengthening exercise per week for weight loss and overall health benefits. We discussed the following Behavioral Modification Strategies today: increasing lean protein intake, decreasing simple carbohydrates, and work on meal planning and easy cooking plans.  [name] has agreed to follow up with our clinic in 3 weeks. She was informed of the importance of frequent follow up visits to maximize her success with intensive lifestyle modifications for her multiple health  conditions.  ALLERGIES: No Known Allergies  MEDICATIONS: Current Outpatient Medications on File Prior to Visit  Medication Sig Dispense Refill  . albuterol (PROVENTIL HFA;VENTOLIN HFA) 108 (90 Base) MCG/ACT inhaler Inhale 1 puff into the lungs every 6 (six) hours as needed for wheezing or shortness of breath. 3 Inhaler 0  . albuterol (PROVENTIL) (2.5 MG/3ML) 0.083% nebulizer solution Take 3 mLs (2.5 mg total) by nebulization every 6 (six) hours as needed for wheezing or shortness of breath. 75 mL 0  . azithromycin (ZITHROMAX) 250 MG tablet 2 pills today and then 1 pill days 2-5. Hold cholesterol drug while on this. 6 tablet 0  . benzonatate (TESSALON) 200 MG capsule Take 1 capsule (200 mg total) by mouth 3 (three) times daily as needed for cough. 20 capsule 0  . cyclobenzaprine (FLEXERIL) 10 MG tablet Take 1 tablet (10 mg total) by mouth 3 (three) times daily as needed for muscle spasms. 30 tablet 0  . guaiFENesin-codeine 100-10 MG/5ML syrup Take 10 mLs by mouth every 6 (six) hours as needed for cough. 120 mL 0  . Insulin Pen Needle (PEN NEEDLES 31GX5/16") 31G X 8 MM MISC 1 each by Does not apply route daily.    Marland Kitchen levothyroxine (SYNTHROID, LEVOTHROID) 75 MCG tablet TAKE 1 TABLET BY MOUTH DAILY 90 tablet 0  . liothyronine (CYTOMEL) 5 MCG tablet Take 2 tabs with levothyroxine in the morning 180 tablet 2  . Liraglutide -Weight Management (SAXENDA) 18 MG/3ML SOPN Inject 3 mg into the skin daily. 5 pen 0  . loratadine (CLARITIN) 10 MG tablet Take 1 tablet (10 mg total) by mouth daily. 90 tablet 0  . predniSONE (DELTASONE) 20 MG tablet Take 2 tablets (40 mg total) by mouth daily. 10 tablet 0  . Pseudoeph-Doxylamine-DM-APAP (NYQUIL PO) Take by mouth.    . Pseudoephedrine-APAP-DM (DAYQUIL PO) Take by mouth.    . venlafaxine XR (EFFEXOR-XR) 150 MG 24 hr capsule TAKE 1 CAPSULE BY MOUTH DAILY WITH BREAKFAST 90 capsule 1   No current facility-administered medications on file prior to visit.     PAST  MEDICAL HISTORY: Past Medical History:  Diagnosis Date  . Abnormal mammogram of left breast 01/22/2016  . Anemia 11/21/2014   -with elevated platelets   . Anxiety   . Asthma   . Breast cyst 11/21/2014   -L breast, upper L just off from center -stable per radiology 10/2014   . Constipation   . Dyspnea   . Fatty liver   . Gallbladder problem   . Gallstone pancreatitis 03/14/2016  . Generalized anxiety disorder 11/21/2014  . GERD (gastroesophageal reflux disease)   . Hypothyroidism   . IBS (irritable bowel syndrome)   . Menorrhagia 11/21/2014   -seeing gyn    . Obesity 11/21/2014  . Plantar fasciitis of left foot 11/21/2014  . Seasonal allergies 11/21/2014  . 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 Thyroid disease     PAST SURGICAL HISTORY: Past Surgical History:  Procedure Laterality Date  . ABDOMINAL HYSTERECTOMY    . BREAST BIOPSY    .  BREAST EXCISIONAL BIOPSY    . BREAST LUMPECTOMY WITH RADIOACTIVE SEED LOCALIZATION Left 01/22/2016   Procedure: LEFT BREAST LUMPECTOMY WITH RADIOACTIVE SEED LOCALIZATION;  Surgeon: Fanny Skates, MD;  Location: [place];  Service: General;  Laterality: Left;  . CESAREAN SECTION     x3  . CHOLECYSTECTOMY N/A 03/15/2016   Procedure: LAPAROSCOPIC CHOLECYSTECTOMY WITH INTRAOPERATIVE CHOLANGIOGRAM;  Surgeon: Jackolyn Confer, MD;  Location: [facility];  Service: General;  Laterality: N/A;  . CYSTO N/A 10/06/2015   Procedure: Consuela Mimes;  Surgeon: Nunzio Cobbs, MD;  Location: [facility];  Service: Gynecology;  Laterality: N/A;  . TUBAL LIGATION      SOCIAL HISTORY: Social History   Tobacco Use  . Smoking status: Former Smoker    Types: Cigarettes  . Smokeless tobacco: Never Used  Substance Use Topics  . Alcohol use: Yes    Alcohol/week: 1.0 standard drinks    Types: 1 Glasses of wine per week    Comment: socially  . Drug use: No    FAMILY HISTORY: Family History  Problem Relation  Age of Onset  . Breast cancer Mother 70       s/p lump and rad  . Skin cancer Mother        non-melanoma  . Thyroid disease Mother   . Skin cancer Father        non-melanoma  . Hypertension Father   . Hyperlipidemia Father   . Breast cancer Paternal Grandmother        dx 68s; s/p mastectomy  . Cervical cancer Maternal Grandmother   . Arthritis Maternal Grandmother   . CVA Maternal Grandmother   . Stroke Maternal Grandmother 80  . Heart attack Maternal Grandfather        d. 35s  . Colon cancer Paternal Grandfather 28  . Rectal cancer Maternal Uncle 54  . Aortic aneurysm Maternal Uncle 53  . Uterine cancer Other        maternal great grandmother (MGM's mother)  . Stroke Other   . Cancer Other        maternal great grandfather (MGM's father); NOS cancer  . Breast cancer Other        maternal great grandmother (MGF's mother)  . Colon cancer Other        maternal great grandfather (MGF's father)  . Heart attack Other   . Colon cancer Other        paternal great grandfather (PGF's father)  . Heart attack Other     ROS: Review of Systems  Constitutional: Positive for weight loss.  Psychiatric/Behavioral: Positive for depression. The patient does not have insomnia.     PHYSICAL EXAM: Blood pressure 127/69, pulse 85, temperature 98 F (36.7 C), temperature source Oral, height 5' (1.524 m), weight 203 lb (92.1 kg), last menstrual period 09/26/2015, SpO2 98 %. Body mass index is 39.65 kg/m. Physical Exam Vitals signs reviewed.  Constitutional:      Appearance: Normal appearance. She is obese.  Cardiovascular:     Rate and Rhythm: Normal rate.  Pulmonary:     Effort: Pulmonary effort is normal.  Musculoskeletal: Normal range of motion.  Skin:    General: Skin is warm and dry.  Neurological:     Mental Status: She is alert and oriented to person, place, and time.  Psychiatric:        Mood and Affect: Mood normal.        Behavior: Behavior normal.     RECENT LABS  AND TESTS: BMET    Component Value Date/Time   NA 140 04/23/2018 1135   NA 139 02/19/2015 0911   K 4.6 04/23/2018 1135   K 4.0 02/19/2015 0911   CL 103 04/23/2018 1135   CO2 22 04/23/2018 1135   CO2 23 02/19/2015 0911   GLUCOSE 83 04/23/2018 1135   GLUCOSE 78 03/24/2016 1433   GLUCOSE 96 02/19/2015 0911   BUN 13 04/23/2018 1135   BUN 11.5 02/19/2015 0911   CREATININE 0.67 04/23/2018 1135   CREATININE 0.8 02/19/2015 0911   CALCIUM 9.4 04/23/2018 1135   CALCIUM 9.6 02/19/2015 0911   GFRNONAA 104 04/23/2018 1135   GFRAA 120 04/23/2018 1135   Lab Results  Component Value Date   HGBA1C 4.9 04/23/2018   HGBA1C 5.1 11/30/2017   Lab Results  Component Value Date   INSULIN 12.3 04/23/2018   INSULIN 21.5 11/30/2017   CBC    Component Value Date/Time   WBC 8.5 04/23/2018 1135   WBC 8.9 03/29/2017 1428   RBC 4.78 04/23/2018 1135   RBC 4.41 03/29/2017 1428   HGB 14.1 04/23/2018 1135   HGB 12.8 02/19/2015 0911   HCT 43.0 04/23/2018 1135   HCT 38.3 02/19/2015 0911   PLT 399 (H) 11/30/2017 0934   MCV 90 04/23/2018 1135   MCV 83.6 02/19/2015 0911   MCH 29.5 04/23/2018 1135   MCH 28.7 03/17/2016 1048   MCHC 32.8 04/23/2018 1135   MCHC 33.5 03/29/2017 1428   RDW 12.7 04/23/2018 1135   RDW 17.0 (H) 02/19/2015 0911   LYMPHSABS 1.8 04/23/2018 1135   LYMPHSABS 2.0 02/19/2015 0911   MONOABS 0.6 03/29/2017 1428   MONOABS 0.7 02/19/2015 0911   EOSABS 0.2 04/23/2018 1135   BASOSABS 0.0 04/23/2018 1135   BASOSABS 0.1 02/19/2015 0911   Iron/TIBC/Ferritin/ %Sat    Component Value Date/Time   IRON 50 02/19/2015 0911   TIBC 359 02/19/2015 0911   FERRITIN 27 02/19/2015 0911   IRONPCTSAT 14 (L) 02/19/2015 0911   Lipid Panel     Component Value Date/Time   CHOL 205 (H) 04/23/2018 1135   TRIG 77 04/23/2018 1135   HDL 69 04/23/2018 1135   CHOLHDL 3.3 11/30/2017 0934   CHOLHDL 3 02/23/2015 1018   VLDL 30.2 02/23/2015 1018   LDLCALC 121 (H) 04/23/2018 1135   Hepatic Function  Panel     Component Value Date/Time   PROT 6.9 04/23/2018 1135   PROT 6.9 02/19/2015 0911   ALBUMIN 4.5 04/23/2018 1135   ALBUMIN 3.5 02/19/2015 0911   AST 15 04/23/2018 1135   AST 13 02/19/2015 0911   ALT 19 04/23/2018 1135   ALT 14 02/19/2015 0911   ALKPHOS 91 04/23/2018 1135   ALKPHOS 84 02/19/2015 0911   BILITOT 0.3 04/23/2018 1135   BILITOT 0.23 02/19/2015 0911      Component Value Date/Time   TSH 2.03 03/15/2018 0902   TSH 4.75 (H) 11/08/2017 0854   TSH 4.30 06/15/2017 0940   Results for MARDELLA, NUCKLES (MRN [id number]) as of 08/29/2018 07:16  Ref. Range 04/23/2018 11:35  Vitamin D, 25-Hydroxy Latest Ref Range: 30.0 - 100.0 ng/mL 34.7    OBESITY BEHAVIORAL INTERVENTION VISIT  Today's visit was # 13   Starting weight: 212 lbs Starting date: 11/30/17 Today's weight : Weight: 203 lb (92.1 kg)  Today's date: 08/28/2018 Total lbs lost to date: 9  ASK: We discussed the diagnosis of obesity with Morey Hummingbird [place]head today and Savilla agreed to give [name] permission to discuss  obesity behavioral modification therapy today.  ASSESS: Francies has the diagnosis of obesity and her BMI today is 39.6. Ahmani is in the action stage of change.   ADVISE: Forest was educated on the multiple health risks of obesity as well as the benefit of weight loss to improve her health. She was advised of the need for long term treatment and the importance of lifestyle modifications to improve her current health and to decrease her risk of future health problems.  AGREE: Multiple dietary modification options and treatment options were discussed and Lura agreed to follow the recommendations documented in the above note.  ARRANGE: Chaunda was educated on the importance of frequent visits to treat obesity as outlined per CMS and USPSTF guidelines and agreed to schedule her next follow up appointment today.  I, Marcille Blanco, am acting as transcriptionist for Starlyn Skeans, MD  I have reviewed the above  documentation for accuracy and completeness, and I agree with the above. -Dennard Nip, MD

## 2018-09-12 ENCOUNTER — Other Ambulatory Visit: Payer: Self-pay | Admitting: Family Medicine

## 2018-09-12 ENCOUNTER — Other Ambulatory Visit (INDEPENDENT_AMBULATORY_CARE_PROVIDER_SITE_OTHER): Payer: Self-pay | Admitting: Family Medicine

## 2018-09-12 ENCOUNTER — Other Ambulatory Visit: Payer: Self-pay | Admitting: Endocrinology

## 2018-09-12 DIAGNOSIS — G8929 Other chronic pain: Secondary | ICD-10-CM

## 2018-09-12 DIAGNOSIS — M542 Cervicalgia: Principal | ICD-10-CM

## 2018-09-18 ENCOUNTER — Encounter (INDEPENDENT_AMBULATORY_CARE_PROVIDER_SITE_OTHER): Payer: Self-pay

## 2018-09-20 ENCOUNTER — Other Ambulatory Visit: Payer: No Typology Code available for payment source

## 2018-09-24 ENCOUNTER — Ambulatory Visit: Payer: No Typology Code available for payment source | Admitting: Endocrinology

## 2018-10-08 ENCOUNTER — Ambulatory Visit (INDEPENDENT_AMBULATORY_CARE_PROVIDER_SITE_OTHER): Payer: No Typology Code available for payment source | Admitting: Family Medicine

## 2018-10-10 ENCOUNTER — Other Ambulatory Visit (INDEPENDENT_AMBULATORY_CARE_PROVIDER_SITE_OTHER): Payer: No Typology Code available for payment source

## 2018-10-10 DIAGNOSIS — E039 Hypothyroidism, unspecified: Secondary | ICD-10-CM

## 2018-10-11 LAB — TSH: TSH: 3.38 u[IU]/mL (ref 0.35–4.50)

## 2018-10-11 LAB — T3, FREE: T3, Free: 3 pg/mL (ref 2.3–4.2)

## 2018-10-11 LAB — T4, FREE: Free T4: 0.68 ng/dL (ref 0.60–1.60)

## 2018-10-15 ENCOUNTER — Other Ambulatory Visit: Payer: Self-pay | Admitting: Internal Medicine

## 2018-10-15 ENCOUNTER — Other Ambulatory Visit: Payer: Self-pay

## 2018-10-15 ENCOUNTER — Encounter: Payer: Self-pay | Admitting: Endocrinology

## 2018-10-15 ENCOUNTER — Ambulatory Visit: Payer: No Typology Code available for payment source | Admitting: Endocrinology

## 2018-10-15 VITALS — BP 120/62 | HR 80 | Ht 60.0 in | Wt 211.0 lb

## 2018-10-15 DIAGNOSIS — R400 Somnolence: Secondary | ICD-10-CM

## 2018-10-15 DIAGNOSIS — E039 Hypothyroidism, unspecified: Secondary | ICD-10-CM | POA: Diagnosis not present

## 2018-10-15 NOTE — Progress Notes (Signed)
Patient ID: Maria [name], female   DOB: 1970-09-22, 48 y.o.   MRN: [id number]           Referring Physician: Colin Benton  Reason for Appointment:  Hypothyroidism, follow-up visit  Chief complaint: Tiredness, low thyroid   History of Present Illness:   Hypothyroidism was first diagnosed in 2014  At the time of diagnosis patient was having symptoms of lethargy, cold sensitivity, difficulty concentrating, weight gain and muscle aches She thinks she had continued symptoms for a couple of years before she was diagnosed to have hypothyroidism in [place] However no records available of her original workup She was started on levothyroxine supplements and she felt significantly better with this but not back to normal   She had more improvement in her symptoms of brain fog and energy level as well as muscle aches.          The patient has been treated with mostly 75 g of levothyroxine In 2017 she was given additional doses of 88 g to take twice a week and 75 g was given the other 5 days of the week  RECENT history: She was seen in consultation in 8/18 because of complaining of lethargy and other symptoms as above Although she had a reasonably good level of T3 of 3.2 she has been given a trial of levothyroxine 62.5 and Cytomel 5 g daily as a trial         Subsequently she has still complained of some fatigue and no improvement in her energy level or weight Cytomel was increased up to 10 mcg on her visit in 9/18 when T3 level was 2.7  Levothyroxine was increased up to 75 mcg in 4/19 At that time she was having occasional symptoms of fatigue but was more concerned about weight gain  She is fairly consistent with taking her levothyroxine and Cytomel before breakfast daily and not skipping any doses   Again she is complaining of feeling fatigued and sleepy, this is despite sleeping fairly well at night Current management for her weight loss does not appear to be working and she is not on  any specific treatment  Her TSH is trending higher at 3.4 Also free T4 is low normal along with free T3 which is 3.0  Patient's weight history is as follows:  Wt Readings from Last 3 Encounters:  10/15/18 211 lb (95.7 kg)  08/28/18 203 lb (92.1 kg)  08/25/18 207 lb 12.8 oz (94.3 kg)    Thyroid function results have been as follows:  Lab Results  Component Value Date   TSH 3.38 10/10/2018   TSH 2.03 03/15/2018   TSH 4.75 (H) 11/08/2017   TSH 4.30 06/15/2017   FREET4 0.68 10/10/2018   FREET4 0.75 03/15/2018   FREET4 0.62 11/08/2017   FREET4 0.67 06/15/2017   T3FREE 3.0 10/10/2018   T3FREE 3.4 03/15/2018   T3FREE 3.4 11/08/2017     Past Medical History:  Diagnosis Date  . Abnormal mammogram of left breast 01/22/2016  . Anemia 11/21/2014   -with elevated platelets   . Anxiety   . Asthma   . Breast cyst 11/21/2014   -L breast, upper L just off from center -stable per radiology 10/2014   . Constipation   . Dyspnea   . Fatty liver   . Gallbladder problem   . Gallstone pancreatitis 03/14/2016  . Generalized anxiety disorder 11/21/2014  . GERD (gastroesophageal reflux disease)   . Hypothyroidism   . IBS (irritable bowel syndrome)   .  Menorrhagia 11/21/2014   -seeing gyn    . Obesity 11/21/2014  . Plantar fasciitis of left foot 11/21/2014  . Seasonal allergies 11/21/2014  . 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 Thyroid disease     Past Surgical History:  Procedure Laterality Date  . ABDOMINAL HYSTERECTOMY    . BREAST BIOPSY    . BREAST EXCISIONAL BIOPSY    . BREAST LUMPECTOMY WITH RADIOACTIVE SEED LOCALIZATION Left 01/22/2016   Procedure: LEFT BREAST LUMPECTOMY WITH RADIOACTIVE SEED LOCALIZATION;  Surgeon: Fanny Skates, MD;  Location: [facility];  Service: General;  Laterality: Left;  . CESAREAN SECTION     x3  . CHOLECYSTECTOMY N/A 03/15/2016   Procedure: LAPAROSCOPIC CHOLECYSTECTOMY WITH INTRAOPERATIVE  CHOLANGIOGRAM;  Surgeon: Jackolyn Confer, MD;  Location: [facility];  Service: General;  Laterality: N/A;  . CYSTO N/A 10/06/2015   Procedure: Consuela Mimes;  Surgeon: Nunzio Cobbs, MD;  Location: [facility];  Service: Gynecology;  Laterality: N/A;  . TUBAL LIGATION      Family History  Problem Relation Age of Onset  . Breast cancer Mother 51       s/p lump and rad  . Skin cancer Mother        non-melanoma  . Thyroid disease Mother   . Skin cancer Father        non-melanoma  . Hypertension Father   . Hyperlipidemia Father   . Breast cancer Paternal Grandmother        dx 101s; s/p mastectomy  . Cervical cancer Maternal Grandmother   . Arthritis Maternal Grandmother   . CVA Maternal Grandmother   . Stroke Maternal Grandmother 80  . Heart attack Maternal Grandfather        d. 45s  . Colon cancer Paternal Grandfather 109  . Rectal cancer Maternal Uncle 54  . Aortic aneurysm Maternal Uncle 53  . Uterine cancer Other        maternal great grandmother (MGM's mother)  . Stroke Other   . Cancer Other        maternal great grandfather (MGM's father); NOS cancer  . Breast cancer Other        maternal great grandmother (MGF's mother)  . Colon cancer Other        maternal great grandfather (MGF's father)  . Heart attack Other   . Colon cancer Other        paternal great grandfather (PGF's father)  . Heart attack Other     Social History:  reports that she has quit smoking. Her smoking use included cigarettes. She has never used smokeless tobacco. She reports current alcohol use of about 1.0 standard drinks of alcohol per week. She reports that she does not use drugs.  Allergies: No Known Allergies  Allergies as of 10/15/2018   No Known Allergies     Medication List       Accurate as of October 15, 2018  3:31 PM. Always use your most recent med list.        albuterol (2.5 MG/3ML) 0.083% nebulizer solution Commonly known as:  PROVENTIL Take 3 mLs (2.5 mg total) by nebulization  every 6 (six) hours as needed for wheezing or shortness of breath.   VENTOLIN HFA 108 (90 Base) MCG/ACT inhaler Generic drug:  albuterol INHALE 1 PUFF INTO THE LUNGS EVERY 6 (SIX) HOURS AS NEEDED FOR WHEEZING OR SHORTNESS OF BREATH.   buPROPion 200 MG 12 hr tablet Commonly known as:  WELLBUTRIN SR  Take 1 tablet (200 mg total) by mouth daily.   cyclobenzaprine 10 MG tablet Commonly known as:  FLEXERIL Take 1 tablet (10 mg total) by mouth 3 (three) times daily as needed for muscle spasms.   levothyroxine 75 MCG tablet Commonly known as:  SYNTHROID, LEVOTHROID TAKE 1 TABLET BY MOUTH ONCE DAILY   liothyronine 5 MCG tablet Commonly known as:  CYTOMEL TAKE 2 TABLETS BY MOUTH EVERY MORNING WITH LEVOTHYROXINE   loratadine 10 MG tablet Commonly known as:  CLARITIN Take 1 tablet (10 mg total) by mouth daily.   PEN NEEDLES 31GX5/16" 31G X 8 MM Misc 1 each by Does not apply route daily.   venlafaxine XR 150 MG 24 hr capsule Commonly known as:  EFFEXOR-XR TAKE 1 CAPSULE BY MOUTH DAILY WITH BREAKFAST   Vitamin D (Ergocalciferol) 1.25 MG (50000 UT) Caps capsule Commonly known as:  DRISDOL Take 1 capsule (50,000 Units total) by mouth every 7 (seven) days.          Review of Systems  She has been on Effexor for quite some time        Examination:    BP 120/62 (BP Location: Left Arm, Patient Position: Sitting, Cuff Size: Normal)   Pulse 80   Ht 5' (1.524 m)   Wt 211 lb (95.7 kg)   LMP 09/26/2015 (Exact Date)   SpO2 96%   BMI 41.21 kg/m   Thyroid not palpable No swelling of the eyes Biceps reflexes are normal No peripheral edema or dry skin  Assessment:  HYPOTHYROIDISM, Primary and diagnosed in 2014  She is continuing to be complaining of fatigue and daytime somnolence This is despite her thyroid levels being reasonably normal including TSH With combination of levothyroxine 75 and liothyronine 10 mcg free T4 is relatively low and TSH trending higher although still  within the range  She has multiple other medical issues along with obesity  May have sleep apnea to explain her daytime somnolence  She is still having difficulty losing weight   PLAN:  We will empirically increase her levothyroxine to 88 mcg Continue liothyronine 10 mcg daily She will discuss sleep studies with PCP  Follow-up in 3 months   Jacon Whetzel 10/15/2018, 3:31 PM    Note: This office note was prepared with Dragon voice recognition system technology. Any transcriptional errors that result from this process are unintentional.

## 2018-10-16 ENCOUNTER — Ambulatory Visit (INDEPENDENT_AMBULATORY_CARE_PROVIDER_SITE_OTHER): Payer: No Typology Code available for payment source | Admitting: Family Medicine

## 2018-10-16 MED ORDER — LEVOTHYROXINE SODIUM 88 MCG PO TABS: 88.0000 ug | ORAL_TABLET | Freq: Every day | ORAL | 1 refills | Status: DC

## 2018-10-22 ENCOUNTER — Encounter (INDEPENDENT_AMBULATORY_CARE_PROVIDER_SITE_OTHER): Payer: Self-pay

## 2018-10-22 ENCOUNTER — Other Ambulatory Visit: Payer: Self-pay | Admitting: Family Medicine

## 2018-10-22 ENCOUNTER — Ambulatory Visit (INDEPENDENT_AMBULATORY_CARE_PROVIDER_SITE_OTHER): Payer: No Typology Code available for payment source | Admitting: Family Medicine

## 2018-10-29 ENCOUNTER — Encounter: Payer: Self-pay | Admitting: Family Medicine

## 2018-10-29 MED ORDER — VENLAFAXINE HCL ER 150 MG PO CP24: ORAL_CAPSULE | ORAL | 0 refills | Status: DC

## 2018-10-29 MED ORDER — ALBUTEROL SULFATE HFA 108 (90 BASE) MCG/ACT IN AERS: INHALATION_SPRAY | RESPIRATORY_TRACT | 0 refills | Status: DC

## 2018-12-12 ENCOUNTER — Telehealth: Payer: Self-pay | Admitting: *Deleted

## 2018-12-12 NOTE — Telephone Encounter (Signed)
Copied from [place] [phone]. Topic: General - Inquiry >> Dec 12, 2018  2:44 PM Vernona Rieger wrote: Reason for CRM: patient said she has had stomach cramping and diarrhea for the last 5 weeks & Dr Maudie Mercury referred her to a GI doctor before but she let time get away and did not go. She would like to know does Dr Maudie Mercury want to do a virtual appt with her or place new referral. Also, she is having anxiety issues and thinks maybe her medication needs to be adjusted , please advise.

## 2018-12-12 NOTE — Telephone Encounter (Signed)
I called the pt and scheduled an appt for 4/30.  Doxy[employer]me sent for virtual visit.

## 2018-12-13 ENCOUNTER — Encounter: Payer: Self-pay | Admitting: Family Medicine

## 2018-12-13 ENCOUNTER — Ambulatory Visit (INDEPENDENT_AMBULATORY_CARE_PROVIDER_SITE_OTHER): Payer: No Typology Code available for payment source | Admitting: Family Medicine

## 2018-12-13 ENCOUNTER — Other Ambulatory Visit: Payer: Self-pay

## 2018-12-13 DIAGNOSIS — R197 Diarrhea, unspecified: Secondary | ICD-10-CM | POA: Diagnosis not present

## 2018-12-13 DIAGNOSIS — R112 Nausea with vomiting, unspecified: Secondary | ICD-10-CM

## 2018-12-13 DIAGNOSIS — F411 Generalized anxiety disorder: Secondary | ICD-10-CM

## 2018-12-13 NOTE — Progress Notes (Signed)
Virtual Visit via Video Note  I connected with Maria [name]  on 12/13/18 at  2:00 PM EDT by a video enabled telemedicine application and verified that I am speaking with the correct person using two identifiers.  Location patient: home Location provider:work or home office Persons participating in the virtual visit: patient, provider  I discussed the limitations of evaluation and management by telemedicine and the availability of in person appointments. The patient expressed understanding and agreed to proceed.   HPI:  NVD: -chronic issues with bowels -but worse the last 5 weeks or so -diarrhea has always been an issue and sometimes vomits -alternating diarrhea and constipation and sometimes vomiting -but for the last 5 weeks reports no constipation and she is having diarrhea most days, feels like everything she eats goes through her with cramping prior to BM -more then 3x per day watery diarrhea -occ nausea and vomits a few times per week, feels bloated and full -occ alcohol 1-2 drinks 1 month -she feels like anxiety has been "off the chart"  - see below -no fevers, malaise, hematochezia, melena, weight loss (reports " I wish"), bilious or bloody emesis, cough, congestion, resp symptoms, SOB, focal severe abd pain -we referred her to GI in the past, but she did not go, wants re-referrla  Anxiety: -generalized, nothing really that she thinks is triggering this -is staying home in light of the COVID 19 pandemic and was anxious to go to GI given pandemic -no severe depression -she has missed her medication intermittently and with taking it on a more regular basis the last 3 days her GI and anxiety symptoms have been better  ROS: See pertinent positives and negatives per HPI.  Past Medical History:  Diagnosis Date  . Abnormal mammogram of left breast 01/22/2016  . Anemia 11/21/2014   -with elevated platelets   . Anxiety   . Asthma   . Atypical nevus 10/10/2016   Left Inner Thigh - Mild   . Breast cyst 11/21/2014   -L breast, upper L just off from center -stable per radiology 10/2014   . Constipation   . Dyspnea   . Fatty liver   . Gallbladder problem   . Gallstone pancreatitis 03/14/2016  . Generalized anxiety disorder 11/21/2014  . GERD (gastroesophageal reflux disease)   . Hypothyroidism   . IBS (irritable bowel syndrome)   . Menorrhagia 11/21/2014   -seeing gyn    . Obesity 11/21/2014  . Plantar fasciitis of left foot 11/21/2014  . Seasonal allergies 11/21/2014  . 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 Thyroid disease     Past Surgical History:  Procedure Laterality Date  . ABDOMINAL HYSTERECTOMY    . BREAST BIOPSY    . BREAST EXCISIONAL BIOPSY    . BREAST LUMPECTOMY WITH RADIOACTIVE SEED LOCALIZATION Left 01/22/2016   Procedure: LEFT BREAST LUMPECTOMY WITH RADIOACTIVE SEED LOCALIZATION;  Surgeon: Fanny Skates, MD;  Location: [facility];  Service: General;  Laterality: Left;  . CESAREAN SECTION     x3  . CHOLECYSTECTOMY N/A 03/15/2016   Procedure: LAPAROSCOPIC CHOLECYSTECTOMY WITH INTRAOPERATIVE CHOLANGIOGRAM;  Surgeon: Jackolyn Confer, MD;  Location: [facility];  Service: General;  Laterality: N/A;  . CYSTO N/A 10/06/2015   Procedure: Consuela Mimes;  Surgeon: Nunzio Cobbs, MD;  Location: [facility];  Service: Gynecology;  Laterality: N/A;  . TUBAL LIGATION      Family History  Problem Relation Age of Onset  . Breast cancer Mother 70  s/p lump and rad  . Skin cancer Mother        non-melanoma  . Thyroid disease Mother   . Skin cancer Father        non-melanoma  . Hypertension Father   . Hyperlipidemia Father   . Breast cancer Paternal Grandmother        dx 70s; s/p mastectomy  . Cervical cancer Maternal Grandmother   . Arthritis Maternal Grandmother   . CVA Maternal Grandmother   . Stroke Maternal Grandmother 80  . Heart attack Maternal Grandfather        d. 74s  . Colon cancer Paternal  Grandfather 37  . Rectal cancer Maternal Uncle 54  . Aortic aneurysm Maternal Uncle 53  . Uterine cancer Other        maternal great grandmother (MGM's mother)  . Stroke Other   . Cancer Other        maternal great grandfather (MGM's father); NOS cancer  . Breast cancer Other        maternal great grandmother (MGF's mother)  . Colon cancer Other        maternal great grandfather (MGF's father)  . Heart attack Other   . Colon cancer Other        paternal great grandfather (PGF's father)  . Heart attack Other     SOCIAL HX: see hpi   Current Outpatient Medications:  .  albuterol (PROVENTIL) (2.5 MG/3ML) 0.083% nebulizer solution, Take 3 mLs (2.5 mg total) by nebulization every 6 (six) hours as needed for wheezing or shortness of breath., Disp: 75 mL, Rfl: 0 .  albuterol (VENTOLIN HFA) 108 (90 Base) MCG/ACT inhaler, INHALE 1 PUFF INTO THE LUNGS EVERY 6 (SIX) HOURS AS NEEDED FOR WHEEZING OR SHORTNESS OF BREATH., Disp: 54 g, Rfl: 0 .  Insulin Pen Needle (PEN NEEDLES 31GX5/16") 31G X 8 MM MISC, 1 each by Does not apply route daily., Disp: , Rfl:  .  levothyroxine (SYNTHROID, LEVOTHROID) 88 MCG tablet, Take 1 tablet (88 mcg total) by mouth daily., Disp: 90 tablet, Rfl: 1 .  liothyronine (CYTOMEL) 5 MCG tablet, TAKE 2 TABLETS BY MOUTH EVERY MORNING WITH LEVOTHYROXINE, Disp: 180 tablet, Rfl: 2 .  loratadine (CLARITIN) 10 MG tablet, Take 1 tablet (10 mg total) by mouth daily., Disp: 90 tablet, Rfl: 0 .  venlafaxine XR (EFFEXOR-XR) 150 MG 24 hr capsule, TAKE 1 CAPSULE BY MOUTH DAILY WITH BREAKFAST **MUST MAKE AN APPOINTMENT**, Disp: 90 capsule, Rfl: 0 .  Vitamin D, Ergocalciferol, (DRISDOL) 1.25 MG (50000 UT) CAPS capsule, Take 1 capsule (50,000 Units total) by mouth every 7 (seven) days., Disp: 4 capsule, Rfl: 0  EXAM:  VITALS per patient if applicable: Wt: 34 T: denies fever  GENERAL: alert, oriented, appears well and in no acute distress  HEENT: atraumatic, conjunttiva clear, no  obvious abnormalities on inspection of external nose and ears  NECK: normal movements of the head and neck  LUNGS: on inspection no signs of respiratory distress, breathing rate appears normal, no obvious gross SOB, gasping or wheezing  CV: no obvious cyanosis  ABD: abd appears soft and nonprotuberant, had patient palpate in all 4 quandrants with mild TTP in upper mid abdomen, otherwise no severe TTP and no rebound or guarding, had her jump up and down and this did not illicit discomfort  MS: moves all visible extremities without noticeable abnormality  PSYCH/NEURO: pleasant and cooperative, no obvious depression or anxiety, speech and thought processing grossly intact  ASSESSMENT AND PLAN:  Discussed  the following assessment and plan:  Diarrhea, unspecified type - Plan: Ambulatory referral to Gastroenterology, Clostridium difficile Toxin B, Qualitative, Real-Time PCR, Stool culture  Nausea and vomiting, intractability of vomiting not specified, unspecified vomiting type - Plan: Ambulatory referral to Gastroenterology  Generalized anxiety disorder  -we discussed possible serious and likely etiologies, workup and treatment, treatment risks and return precautions. No pain with jumping up and down, exam and no wt loss or fever are a bit re-assuring. However, given the degree of symptoms feel needs stool studies, labs and GI evaluation to rule out things like colitis, ulcer, gastritis, IBD, celiac, and other conditions. If work up is neg and in the interim feel treating anxiety will be key. -after this discussion, Anaissa opted for GI referral,  I did put in stool studies in case wants to try to get these done here, defer labs to GI given COVID pandemic to avoid trips, imodium, staying hydrated, remove dairy for now.  Discussed options for anxiety and advised to consider buspar and CBT. She will consider, but first will try taking her current medication daily as feeling better after doing so for  3 days. Advised using pill box. -close follow up next week -of course, we advised Starlee  to return or notify a doctor immediately if symptoms worsen or persist or new concerns arise.    I discussed the assessment and treatment plan with the patient. The patient was provided an opportunity to ask questions and all were answered. The patient agreed with the plan and demonstrated an understanding of the instructions.    Follow up instructions: Advised assistant Wendie Simmer to help patient arrange the following: -follow up with Dr. Maudie Mercury Tuesday next week - let her know we could start some stool studies if she wishes/thinks could provide stool sample.    Lucretia Kern, DO

## 2018-12-18 ENCOUNTER — Ambulatory Visit (INDEPENDENT_AMBULATORY_CARE_PROVIDER_SITE_OTHER): Payer: No Typology Code available for payment source | Admitting: Gastroenterology

## 2018-12-18 ENCOUNTER — Encounter: Payer: Self-pay | Admitting: Gastroenterology

## 2018-12-18 ENCOUNTER — Other Ambulatory Visit: Payer: Self-pay

## 2018-12-18 ENCOUNTER — Ambulatory Visit: Payer: No Typology Code available for payment source | Admitting: Gastroenterology

## 2018-12-18 VITALS — Ht 59.0 in | Wt 212.0 lb

## 2018-12-18 DIAGNOSIS — R197 Diarrhea, unspecified: Secondary | ICD-10-CM

## 2018-12-18 NOTE — Patient Instructions (Addendum)
She will come in for stool testing, GI pathogen panel, for acute watery diarrhea.  Your provider has requested that you go to the basement level for lab work . Press "B" on the elevator. The lab is located at the first door on the left as you exit the elevator.

## 2018-12-18 NOTE — Progress Notes (Signed)
This service was provided via virtual visit.  We attempted to use audio and visual but visual did not work and so in the end we just use audio.  The patient was located at home.  I was located in my office.  The patient did consent to this virtual visit and is aware of possible charges through their insurance for this visit.  The patient is a new patient, referred by her primary care physician Dr. Maudie [name].  My certified medical assistant, Grace Bushy, contributed to this visit by contacting the patient by phone 1 or 2 business days prior to the appointment and also followed up on the recommendations I made after the visit.  Time spent on virtual visit: 23 min   HPI: This is a very pleasant 48 year old [name] who noticed a significant change in her bowels about 6 weeks ago.  Prior to the change she would have alternating constipation diarrhea.  Her constipation events could last several days and then she would have a day of very loose stools.  6 weeks ago however she started having profuse watery diarrhea every day exacerbated after she would eat any type of food.  She does not see blood in her stool.  She has some cramps prior to the diarrhea but no real abdominal pains otherwise and no abdominal tenderness.  She will go at least 10 times per day.  She has not had any nocturnal symptoms.  She has had no fevers or chills  She was on Zithromax in early to mid January for an upper respiratory infection, her GI symptoms started about 6 to 8 weeks later.   Not losing weight.  No IBD in her family that she knows of.  No sports drinks, no sugar free candies.  She underwent a laparoscopic cholecystectomy by Dr. Jackolyn Confer for symptomatic gallstones August 2017.  She recently saw her primary care physician for diarrhea, routine stool culture and C. difficile testing was ordered however she was not aware of those tests and so has not completed them.  She has morbid obesity with a BMI close to  43  Has not tried imodium.   Chief complaint is acute diarrhea  ROS: complete GI ROS as described in HPI, all other review negative.  Constitutional:  No unintentional weight loss   Past Medical History:  Diagnosis Date  . Abnormal mammogram of left breast 01/22/2016  . Anemia 11/21/2014   -with elevated platelets   . Anxiety   . Asthma   . Atypical nevus 10/10/2016   Left Inner Thigh - Mild  . Breast cyst 11/21/2014   -L breast, upper L just off from center -stable per radiology 10/2014   . Constipation   . Dyspnea   . Fatty liver   . Gallbladder problem   . Gallstone pancreatitis 03/14/2016  . Generalized anxiety disorder 11/21/2014  . GERD (gastroesophageal reflux disease)   . Hypothyroidism   . IBS (irritable bowel syndrome)   . Menorrhagia 11/21/2014   -seeing gyn    . Obesity 11/21/2014  . Plantar fasciitis of left foot 11/21/2014  . Seasonal allergies 11/21/2014  . 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 Thyroid disease     Past Surgical History:  Procedure Laterality Date  . ABDOMINAL HYSTERECTOMY    . BREAST BIOPSY    . BREAST EXCISIONAL BIOPSY    . BREAST LUMPECTOMY WITH RADIOACTIVE SEED LOCALIZATION Left 01/22/2016   Procedure: LEFT BREAST LUMPECTOMY WITH RADIOACTIVE SEED LOCALIZATION;  Surgeon: Fanny Skates, MD;  Location: [facility];  Service: General;  Laterality: Left;  . CESAREAN SECTION     x3  . CHOLECYSTECTOMY N/A 03/15/2016   Procedure: LAPAROSCOPIC CHOLECYSTECTOMY WITH INTRAOPERATIVE CHOLANGIOGRAM;  Surgeon: Jackolyn Confer, MD;  Location: [facility];  Service: General;  Laterality: N/A;  . CYSTO N/A 10/06/2015   Procedure: Consuela Mimes;  Surgeon: Nunzio Cobbs, MD;  Location: [facility];  Service: Gynecology;  Laterality: N/A;  . TUBAL LIGATION      Current Outpatient Medications  Medication Sig Dispense Refill  . albuterol (PROVENTIL) (2.5 MG/3ML) 0.083% nebulizer solution Take 3 mLs (2.5 mg  total) by nebulization every 6 (six) hours as needed for wheezing or shortness of breath. Maria mL 0  . albuterol (VENTOLIN HFA) 108 (90 Base) MCG/ACT inhaler INHALE 1 PUFF INTO THE LUNGS EVERY 6 (SIX) HOURS AS NEEDED FOR WHEEZING OR SHORTNESS OF BREATH. 54 g 0  . levothyroxine (SYNTHROID, LEVOTHROID) 88 MCG tablet Take 1 tablet (88 mcg total) by mouth daily. 90 tablet 1  . liothyronine (CYTOMEL) 5 MCG tablet TAKE 2 TABLETS BY MOUTH EVERY MORNING WITH LEVOTHYROXINE 180 tablet 2  . loratadine (CLARITIN) 10 MG tablet Take 1 tablet (10 mg total) by mouth daily. 90 tablet 0  . venlafaxine XR (EFFEXOR-XR) 150 MG 24 hr capsule TAKE 1 CAPSULE BY MOUTH DAILY WITH BREAKFAST **MUST MAKE AN APPOINTMENT** 90 capsule 0   No current facility-administered medications for this visit.     Allergies as of 12/18/2018  . (No Known Allergies)    Family History  Problem Relation Age of Onset  . Breast cancer Mother 38       s/p lump and rad  . Skin cancer Mother        non-melanoma  . Thyroid disease Mother   . Skin cancer Father        non-melanoma  . Hypertension Father   . Hyperlipidemia Father   . Breast cancer Paternal Grandmother        dx 25s; s/p mastectomy  . Cervical cancer Maternal Grandmother   . Arthritis Maternal Grandmother   . CVA Maternal Grandmother   . Stroke Maternal Grandmother 80  . Heart attack Maternal Grandfather        d. 8s  . Colon cancer Paternal Grandfather 76  . Rectal cancer Maternal Uncle 54  . Aortic aneurysm Maternal Uncle 53  . Uterine cancer Other        maternal great grandmother (MGM's mother)  . Stroke Other   . Cancer Other        maternal great grandfather (MGM's father); NOS cancer  . Breast cancer Other        maternal great grandmother (MGF's mother)  . Colon cancer Other        maternal great grandfather (MGF's father)  . Heart attack Other   . Colon cancer Other        paternal great grandfather (PGF's father)  . Heart attack Other     Social  History   Socioeconomic History  . Marital status: Married    Spouse name: Elberta Fortis Fredman  . Number of children: 3  . Years of education: Not on file  . Highest education level: Not on file  Occupational History  . Occupation: Government social research officer  Social Needs  . Financial resource strain: Not on file  . Food insecurity:    Worry: Not on file    Inability: Not on file  .  Transportation needs:    Medical: Not on file    Non-medical: Not on file  Tobacco Use  . Smoking status: Former Smoker    Types: Cigarettes  . Smokeless tobacco: Never Used  Substance and Sexual Activity  . Alcohol use: Yes    Alcohol/week: 1.0 standard drinks    Types: 1 Glasses of wine per week    Comment: socially  . Drug use: No  . Sexual activity: Yes    Partners: Male    Birth control/protection: Surgical    Comment: Tubal  Lifestyle  . Physical activity:    Days per week: Not on file    Minutes per session: Not on file  . Stress: Not on file  Relationships  . Social connections:    Talks on phone: Not on file    Gets together: Not on file    Attends religious service: Not on file    Active member of club or organization: Not on file    Attends meetings of clubs or organizations: Not on file    Relationship status: Not on file  . Intimate partner violence:    Fear of current or ex partner: Not on file    Emotionally abused: Not on file    Physically abused: Not on file    Forced sexual activity: Not on file  Other Topics Concern  . Not on file  Social History Narrative  . Not on file     Physical Exam: Unable to perform because this was a "telemed visit" due to current Covid-19 pandemic  Assessment and plan: 48 y.o. female with acute watery diarrhea  Her symptoms started 6 to 8 weeks after Zithromax for an upper respiratory infection.  Certainly this could be C. difficile.  I am going to start the work-up with GI pathogen panel which includes C. difficile testing.  If this  is unhelpful then she understands she will probably need a colonoscopy.  In the meantime she will try taking a single Imodium once or twice a day to see if that helps.  I see no reason for any further blood tests or imaging studies other than that noted above.  Please see the "Patient Instructions" section for addition details about the plan.  Owens Loffler, MD [facility] 12/18/2018, 2:38 PM

## 2018-12-19 ENCOUNTER — Other Ambulatory Visit: Payer: No Typology Code available for payment source

## 2018-12-19 DIAGNOSIS — R197 Diarrhea, unspecified: Secondary | ICD-10-CM

## 2018-12-20 ENCOUNTER — Ambulatory Visit: Payer: No Typology Code available for payment source | Admitting: Family Medicine

## 2018-12-20 LAB — GASTROINTESTINAL PATHOGEN PANEL PCR
C. difficile Tox A/B, PCR: DETECTED — AB
Campylobacter, PCR: NOT DETECTED
Cryptosporidium, PCR: NOT DETECTED
E coli (ETEC) LT/ST PCR: NOT DETECTED
E coli (STEC) stx1/stx2, PCR: NOT DETECTED
E coli 0157, PCR: NOT DETECTED
Giardia lamblia, PCR: NOT DETECTED
Norovirus, PCR: NOT DETECTED
Rotavirus A, PCR: NOT DETECTED
Salmonella, PCR: NOT DETECTED
Shigella, PCR: NOT DETECTED

## 2018-12-21 ENCOUNTER — Other Ambulatory Visit: Payer: Self-pay

## 2018-12-21 MED ORDER — VANCOMYCIN 50 MG/ML ORAL SOLUTION: 125.0000 mg | Freq: Four times a day (QID) | ORAL | 0 refills | Status: AC

## 2018-12-21 MED ORDER — VANCOMYCIN 50 MG/ML ORAL SOLUTION: 125.0000 mg | Freq: Four times a day (QID) | ORAL | 0 refills | Status: DC

## 2019-01-04 MED FILL — LIOTHYRONINE SODIUM 5 MCG T: 5 | 90 days supply | Qty: 180 | Fill #0

## 2019-01-08 ENCOUNTER — Encounter: Payer: Self-pay | Admitting: General Surgery

## 2019-01-08 ENCOUNTER — Telehealth: Payer: Self-pay | Admitting: General Surgery

## 2019-01-08 NOTE — Telephone Encounter (Signed)
Pre-screened patient

## 2019-01-09 ENCOUNTER — Ambulatory Visit (INDEPENDENT_AMBULATORY_CARE_PROVIDER_SITE_OTHER): Payer: No Typology Code available for payment source | Admitting: Gastroenterology

## 2019-01-09 ENCOUNTER — Other Ambulatory Visit: Payer: Self-pay

## 2019-01-09 ENCOUNTER — Encounter: Payer: Self-pay | Admitting: Gastroenterology

## 2019-01-09 VITALS — Ht 59.0 in | Wt 212.0 lb

## 2019-01-09 DIAGNOSIS — A0472 Enterocolitis due to Clostridium difficile, not specified as recurrent: Secondary | ICD-10-CM | POA: Diagnosis not present

## 2019-01-09 MED ORDER — FIDAXOMICIN 200 MG PO TABS: 200.0000 mg | ORAL_TABLET | Freq: Two times a day (BID) | ORAL | 0 refills | Status: DC

## 2019-01-09 NOTE — Progress Notes (Signed)
Review of pertinent gastrointestinal problems: 1.  Acute diarrhea, found to be C. Diff + by toxin GI pathogen panel May 2020.  She did not respond to oral vancomycin 125 mg 4 times daily for 2-week course.  This service was provided via virtual visit.  Both audio and visual were used.  The patient was located at work.  I was located in my office.  The patient did consent to this virtual visit and is aware of possible charges through their insurance for this visit.  The patient is an established patient.  My certified medical assistant, Maria [name], contributed to this visit by contacting the patient by phone 1 or 2 business days prior to the appointment and also followed up on the recommendations I made after the visit.  Time spent on virtual visit: 18 min   HPI: This is a very pleasant 48 year old woman whom I last visited with about a month ago.  She had acute diarrhea within 2 or 3 months of an antibiotic Zithromax for an upper respiratory infection.  GI pathogen panel found her to be positive for C. difficile infection and I put her on oral vancomycin 4 times daily 125 mg for 2 weeks.  She really has not noticed much difference at all since completing the antibiotics.  She is still having multiple loose watery stools a day.  Slightly blood-tinged at times.  She thinks this is from raw anus from so much wiping.  No fevers or chills, very little abdominal discomforts.  She has modified her diet so she is not eating nearly as much.  No nocturnal diarrhea  Just bought imodium but she has not tried it yet  Chief complaint is acute diarrhea  ROS: complete GI ROS as described in HPI, all other review negative.  Constitutional:  No unintentional weight loss   Past Medical History:  Diagnosis Date  . Abnormal mammogram of left breast 01/22/2016  . Anemia 11/21/2014   -with elevated platelets   . Anxiety   . Asthma   . Atypical nevus 10/10/2016   Left Inner Thigh - Mild  . Breast cyst  11/21/2014   -L breast, upper L just off from center -stable per radiology 10/2014   . Constipation   . Dyspnea   . Fatty liver   . Gallbladder problem   . Gallstone pancreatitis 03/14/2016  . Generalized anxiety disorder 11/21/2014  . GERD (gastroesophageal reflux disease)   . Hypothyroidism   . IBS (irritable bowel syndrome)   . Menorrhagia 11/21/2014   -seeing gyn    . Obesity 11/21/2014  . Plantar fasciitis of left foot 11/21/2014  . Seasonal allergies 11/21/2014  . 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 Thyroid disease     Past Surgical History:  Procedure Laterality Date  . ABDOMINAL HYSTERECTOMY    . BREAST BIOPSY    . BREAST EXCISIONAL BIOPSY    . BREAST LUMPECTOMY WITH RADIOACTIVE SEED LOCALIZATION Left 01/22/2016   Procedure: LEFT BREAST LUMPECTOMY WITH RADIOACTIVE SEED LOCALIZATION;  Surgeon: Fanny Skates, MD;  Location: [facility];  Service: General;  Laterality: Left;  . CESAREAN SECTION     x3  . CHOLECYSTECTOMY N/A 03/15/2016   Procedure: LAPAROSCOPIC CHOLECYSTECTOMY WITH INTRAOPERATIVE CHOLANGIOGRAM;  Surgeon: Jackolyn Confer, MD;  Location: [facility];  Service: General;  Laterality: N/A;  . CYSTO N/A 10/06/2015   Procedure: Consuela Mimes;  Surgeon: Nunzio Cobbs, MD;  Location: [facility];  Service: Gynecology;  Laterality: N/A;  .  TUBAL LIGATION      Current Outpatient Medications  Medication Sig Dispense Refill  . albuterol (PROVENTIL) (2.5 MG/3ML) 0.083% nebulizer solution Take 3 mLs (2.5 mg total) by nebulization every 6 (six) hours as needed for wheezing or shortness of breath. 75 mL 0  . albuterol (VENTOLIN HFA) 108 (90 Base) MCG/ACT inhaler INHALE 1 PUFF INTO THE LUNGS EVERY 6 (SIX) HOURS AS NEEDED FOR WHEEZING OR SHORTNESS OF BREATH. 54 g 0  . levothyroxine (SYNTHROID, LEVOTHROID) 88 MCG tablet Take 1 tablet (88 mcg total) by mouth daily. 90 tablet 1  . liothyronine (CYTOMEL) 5 MCG tablet TAKE 2  TABLETS BY MOUTH EVERY MORNING WITH LEVOTHYROXINE 180 tablet 2  . loratadine (CLARITIN) 10 MG tablet Take 1 tablet (10 mg total) by mouth daily. 90 tablet 0  . venlafaxine XR (EFFEXOR-XR) 150 MG 24 hr capsule TAKE 1 CAPSULE BY MOUTH DAILY WITH BREAKFAST **MUST MAKE AN APPOINTMENT** 90 capsule 0   No current facility-administered medications for this visit.     Allergies as of 01/09/2019  . (No Known Allergies)    Family History  Problem Relation Age of Onset  . Breast cancer Mother 73       s/p lump and rad  . Skin cancer Mother        non-melanoma  . Thyroid disease Mother   . Skin cancer Father        non-melanoma  . Hypertension Father   . Hyperlipidemia Father   . Breast cancer Paternal Grandmother        dx 29s; s/p mastectomy  . Cervical cancer Maternal Grandmother   . Arthritis Maternal Grandmother   . CVA Maternal Grandmother   . Stroke Maternal Grandmother 80  . Heart attack Maternal Grandfather        d. 39s  . Colon cancer Paternal Grandfather 32  . Rectal cancer Maternal Uncle 54  . Aortic aneurysm Maternal Uncle 53  . Uterine cancer Other        maternal great grandmother (MGM's mother)  . Stroke Other   . Cancer Other        maternal great grandfather (MGM's father); NOS cancer  . Breast cancer Other        maternal great grandmother (MGF's mother)  . Colon cancer Other        maternal great grandfather (MGF's father)  . Heart attack Other   . Colon cancer Other        paternal great grandfather (PGF's father)  . Heart attack Other     Social History   Socioeconomic History  . Marital status: Married    Spouse name: Elberta Fortis Vohs  . Number of children: 3  . Years of education: Not on file  . Highest education level: Not on file  Occupational History  . Occupation: Government social research officer  Social Needs  . Financial resource strain: Not on file  . Food insecurity:    Worry: Not on file    Inability: Not on file  . Transportation needs:     Medical: Not on file    Non-medical: Not on file  Tobacco Use  . Smoking status: Former Smoker    Types: Cigarettes  . Smokeless tobacco: Never Used  Substance and Sexual Activity  . Alcohol use: Yes    Alcohol/week: 1.0 standard drinks    Types: 1 Glasses of wine per week    Comment: socially  . Drug use: No  . Sexual activity: Yes    Partners:  Male    Birth control/protection: Surgical    Comment: Tubal  Lifestyle  . Physical activity:    Days per week: Not on file    Minutes per session: Not on file  . Stress: Not on file  Relationships  . Social connections:    Talks on phone: Not on file    Gets together: Not on file    Attends religious service: Not on file    Active member of club or organization: Not on file    Attends meetings of clubs or organizations: Not on file    Relationship status: Not on file  . Intimate partner violence:    Fear of current or ex partner: Not on file    Emotionally abused: Not on file    Physically abused: Not on file    Forced sexual activity: Not on file  Other Topics Concern  . Not on file  Social History Narrative  . Not on file     Physical Exam: Unable to perform because this was a "telemed visit" due to current Covid-19 pandemic  Assessment and plan: 48 y.o. female with acute diarrhea  It is very unusual to have no clinical improvement with oral vancomycin.  That being said I still am very suspicious that she has C. difficile infection given the clinical scenario and the positive GI pathogen panel.  Going to try her on second line antibiotic fidaxomicin 200 mg twice daily for 10 days and she will return for visit, in person or by telemedicine in 2 weeks.  She knows to call here sooner if she is having difficulties  Please see the "Patient Instructions" section for addition details about the plan.  Owens Loffler, MD [facility] 01/09/2019, 1:46 PM

## 2019-01-09 NOTE — Patient Instructions (Addendum)
We will call in a new prescription for her, fidaxomycin 200mg  pills, one pill twice daily for 10 days.  Please return to see Dr. Ardis Hughs in 2 weeks, sooner if any troubles./  Thank you for entrusting me with your care and choosing [facility].  Dr Ardis Hughs

## 2019-01-10 ENCOUNTER — Other Ambulatory Visit: Payer: Self-pay

## 2019-01-10 ENCOUNTER — Other Ambulatory Visit (INDEPENDENT_AMBULATORY_CARE_PROVIDER_SITE_OTHER): Payer: No Typology Code available for payment source

## 2019-01-10 DIAGNOSIS — E039 Hypothyroidism, unspecified: Secondary | ICD-10-CM | POA: Diagnosis not present

## 2019-01-10 LAB — T3, FREE: T3, Free: 3.3 pg/mL (ref 2.3–4.2)

## 2019-01-10 LAB — T4, FREE: Free T4: 0.77 ng/dL (ref 0.60–1.60)

## 2019-01-10 LAB — TSH: TSH: 2.79 u[IU]/mL (ref 0.35–4.50)

## 2019-01-13 NOTE — Progress Notes (Signed)
Patient ID: Maria [name], female   DOB: May 11, 1971, 48 y.o.   MRN: [id number]           Referring Physician: Colin Benton   Today's office visit was provided via telemedicine using video technique Explained to the patient and the the limitations of evaluation and management by telemedicine and the availability of in person appointments.  The patient understood the limitations and agreed to proceed. Patient also understood that the telehealth visit is billable. . Location of the patient: Home . Location of the provider: Office Only the patient and myself were participating in the encounter    Reason for Appointment:  Hypothyroidism, follow-up visit  Chief complaint: Tiredness, low thyroid   History of Present Illness:   Hypothyroidism was first diagnosed in 2014  At the time of diagnosis patient was having symptoms of lethargy, cold sensitivity, difficulty concentrating, weight gain and muscle aches She thinks she had continued symptoms for a couple of years before she was diagnosed to have hypothyroidism in [place] However no records available of her original workup She was started on levothyroxine supplements and she felt significantly better with this but not back to normal   She had more improvement in her symptoms of brain fog and energy level as well as muscle aches.          The patient has been treated with mostly 75 g of levothyroxine In 2017 she was given additional doses of 88 g to take twice a week and 75 g was given the other 5 days of the week  RECENT history: She was seen in consultation in 8/18 because of complaining of lethargy and other symptoms as above Although she had a reasonably good level of T3 of 3.2 she has been given a trial of levothyroxine 62.5 and Cytomel 5 g daily as a trial         Subsequently she has still complained of some fatigue and no improvement in her energy level or weight Liothyronine was increased up to 10 mcg on her visit in  9/18 when T3 level was 2.7  Levothyroxine was increased up to 75 mcg in 4/19 At that time she was having occasional symptoms of fatigue but was more concerned about weight gain Subsequently her levothyroxine has been increased to 88 mcg as of 3/21 her TSH was 3.4  Since then she has not felt any better and she continues to feel tired and exhausted.  Also complaining of feeling achy  She is fairly consistent with taking her levothyroxine and Cytomel before breakfast daily without any other supplements   Weight is about the same  Her labs show TSH to be 2.8 and free T4 and free T3 normal  Patient's weight history is as follows:  Wt Readings from Last 3 Encounters:  01/09/19 212 lb (96.2 kg)  01/08/19 212 lb (96.2 kg)  12/18/18 212 lb (96.2 kg)    Thyroid function results have been as follows:  Lab Results  Component Value Date   TSH 2.79 01/10/2019   TSH 3.38 10/10/2018   TSH 2.03 03/15/2018   TSH 4.75 (H) 11/08/2017   FREET4 0.77 01/10/2019   FREET4 0.68 10/10/2018   FREET4 0.75 03/15/2018   FREET4 0.62 11/08/2017   T3FREE 3.3 01/10/2019   T3FREE 3.0 10/10/2018   T3FREE 3.4 03/15/2018     Past Medical History:  Diagnosis Date  . Abnormal mammogram of left breast 01/22/2016  . Anemia 11/21/2014   -with elevated platelets   .  Anxiety   . Asthma   . Atypical nevus 10/10/2016   Left Inner Thigh - Mild  . Breast cyst 11/21/2014   -L breast, upper L just off from center -stable per radiology 10/2014   . Constipation   . Dyspnea   . Fatty liver   . Gallbladder problem   . Gallstone pancreatitis 03/14/2016  . Generalized anxiety disorder 11/21/2014  . GERD (gastroesophageal reflux disease)   . Hypothyroidism   . IBS (irritable bowel syndrome)   . Menorrhagia 11/21/2014   -seeing gyn    . Obesity 11/21/2014  . Plantar fasciitis of left foot 11/21/2014  . Seasonal allergies 11/21/2014  . 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  Thyroid disease     Past Surgical History:  Procedure Laterality Date  . ABDOMINAL HYSTERECTOMY    . BREAST BIOPSY    . BREAST EXCISIONAL BIOPSY    . BREAST LUMPECTOMY WITH RADIOACTIVE SEED LOCALIZATION Left 01/22/2016   Procedure: LEFT BREAST LUMPECTOMY WITH RADIOACTIVE SEED LOCALIZATION;  Surgeon: Fanny Skates, MD;  Location: [facility];  Service: General;  Laterality: Left;  . CESAREAN SECTION     x3  . CHOLECYSTECTOMY N/A 03/15/2016   Procedure: LAPAROSCOPIC CHOLECYSTECTOMY WITH INTRAOPERATIVE CHOLANGIOGRAM;  Surgeon: Jackolyn Confer, MD;  Location: [facility];  Service: General;  Laterality: N/A;  . CYSTO N/A 10/06/2015   Procedure: Consuela Mimes;  Surgeon: Nunzio Cobbs, MD;  Location: [facility];  Service: Gynecology;  Laterality: N/A;  . TUBAL LIGATION      Family History  Problem Relation Age of Onset  . Breast cancer Mother 30       s/p lump and rad  . Skin cancer Mother        non-melanoma  . Thyroid disease Mother   . Skin cancer Father        non-melanoma  . Hypertension Father   . Hyperlipidemia Father   . Breast cancer Paternal Grandmother        dx 48s; s/p mastectomy  . Cervical cancer Maternal Grandmother   . Arthritis Maternal Grandmother   . CVA Maternal Grandmother   . Stroke Maternal Grandmother 80  . Heart attack Maternal Grandfather        d. 28s  . Colon cancer Paternal Grandfather 38  . Rectal cancer Maternal Uncle 54  . Aortic aneurysm Maternal Uncle 53  . Uterine cancer Other        maternal great grandmother (MGM's mother)  . Stroke Other   . Cancer Other        maternal great grandfather (MGM's father); NOS cancer  . Breast cancer Other        maternal great grandmother (MGF's mother)  . Colon cancer Other        maternal great grandfather (MGF's father)  . Heart attack Other   . Colon cancer Other        paternal great grandfather (PGF's father)  . Heart attack Other     Social History:  reports that she has quit  smoking. Her smoking use included cigarettes. She has never used smokeless tobacco. She reports current alcohol use of about 1.0 standard drinks of alcohol per week. She reports that she does not use drugs.  Allergies: No Known Allergies  Allergies as of 01/14/2019   No Known Allergies     Medication List       Accurate as of Jan 13, 2019  3:37 PM. If you have  any questions, ask your nurse or doctor.        albuterol (2.5 MG/3ML) 0.083% nebulizer solution Commonly known as:  PROVENTIL Take 3 mLs (2.5 mg total) by nebulization every 6 (six) hours as needed for wheezing or shortness of breath.   albuterol 108 (90 Base) MCG/ACT inhaler Commonly known as:  Ventolin HFA INHALE 1 PUFF INTO THE LUNGS EVERY 6 (SIX) HOURS AS NEEDED FOR WHEEZING OR SHORTNESS OF BREATH.   fidaxomicin 200 MG Tabs tablet Commonly known as:  DIFICID Take 1 tablet (200 mg total) by mouth 2 (two) times daily.   levothyroxine 88 MCG tablet Commonly known as:  SYNTHROID Take 1 tablet (88 mcg total) by mouth daily.   liothyronine 5 MCG tablet Commonly known as:  CYTOMEL TAKE 2 TABLETS BY MOUTH EVERY MORNING WITH LEVOTHYROXINE   loratadine 10 MG tablet Commonly known as:  CLARITIN Take 1 tablet (10 mg total) by mouth daily.   venlafaxine XR 150 MG 24 hr capsule Commonly known as:  EFFEXOR-XR TAKE 1 CAPSULE BY MOUTH DAILY WITH BREAKFAST **MUST MAKE AN APPOINTMENT**          Review of Systems  She has been on Effexor long-term  Has not seen a rheumatologist for her generalized aches and pains  Also has been recommended sleep study with PCP again this has not been done        Examination:    LMP 09/26/2015 (Exact Date)    Assessment:  HYPOTHYROIDISM, Primary and diagnosed in 2014  Her thyroid levels are adequate with normal TSH, slightly improved with increasing her levothyroxine up to 88 mcg T3 is normal with her liothyronine supplement  She has continued problems with feeling extremely  tired and has some somnolence as well as generalized achiness in her muscles Likely has fibromyalgia  She will need to discuss these issues with her PCP   PLAN:  She will need to stay on the same regimen of 88 mcg levothyroxine and liothyronine 10 mcg daily before breakfast  Follow-up with PCP for other complaints and further evaluation  Follow-up here in 6 months   Jomari Bartnik 01/13/2019, 3:37 PM    Note: This office note was prepared with Dragon voice recognition system technology. Any transcriptional errors that result from this process are unintentional.

## 2019-01-14 ENCOUNTER — Ambulatory Visit (INDEPENDENT_AMBULATORY_CARE_PROVIDER_SITE_OTHER): Payer: No Typology Code available for payment source | Admitting: Endocrinology

## 2019-01-14 ENCOUNTER — Encounter: Payer: Self-pay | Admitting: Endocrinology

## 2019-01-14 ENCOUNTER — Other Ambulatory Visit: Payer: Self-pay

## 2019-01-14 VITALS — Ht 59.0 in

## 2019-01-14 DIAGNOSIS — E039 Hypothyroidism, unspecified: Secondary | ICD-10-CM

## 2019-01-31 ENCOUNTER — Telehealth: Payer: Self-pay | Admitting: Gastroenterology

## 2019-01-31 DIAGNOSIS — A0472 Enterocolitis due to Clostridium difficile, not specified as recurrent: Secondary | ICD-10-CM

## 2019-01-31 DIAGNOSIS — R197 Diarrhea, unspecified: Secondary | ICD-10-CM

## 2019-01-31 NOTE — Telephone Encounter (Signed)
Left message on machine to call back  

## 2019-01-31 NOTE — Telephone Encounter (Signed)
Has a history of C diff and took vanc and it did not help.  Was started on dificid and finished dificid on Saturday and nausea began on Sunday and today she had a watery BM.  Has some abd discomfort and cramping.  No fever.  Please advise

## 2019-02-01 NOTE — Telephone Encounter (Signed)
The pt was advised and will come in today for stool testing

## 2019-02-01 NOTE — Telephone Encounter (Signed)
She needs a repeat GI pathogen panel.  Thank you

## 2019-02-06 ENCOUNTER — Other Ambulatory Visit: Payer: No Typology Code available for payment source

## 2019-02-06 DIAGNOSIS — R197 Diarrhea, unspecified: Secondary | ICD-10-CM

## 2019-02-06 DIAGNOSIS — A0472 Enterocolitis due to Clostridium difficile, not specified as recurrent: Secondary | ICD-10-CM

## 2019-02-08 ENCOUNTER — Telehealth: Payer: Self-pay | Admitting: Gastroenterology

## 2019-02-08 LAB — GASTROINTESTINAL PATHOGEN PANEL PCR

## 2019-02-08 NOTE — Telephone Encounter (Signed)
Pt available results not available.  We will call her as soon as resulted.

## 2019-02-11 ENCOUNTER — Other Ambulatory Visit: Payer: Self-pay | Admitting: Gastroenterology

## 2019-02-11 DIAGNOSIS — A0472 Enterocolitis due to Clostridium difficile, not specified as recurrent: Secondary | ICD-10-CM

## 2019-03-12 ENCOUNTER — Encounter: Payer: Self-pay | Admitting: Family Medicine

## 2019-03-12 ENCOUNTER — Other Ambulatory Visit: Payer: Self-pay | Admitting: Obstetrics and Gynecology

## 2019-03-12 DIAGNOSIS — Z1231 Encounter for screening mammogram for malignant neoplasm of breast: Secondary | ICD-10-CM

## 2019-03-12 MED ORDER — VENLAFAXINE HCL ER 150 MG PO CP24: ORAL_CAPSULE | ORAL | 0 refills | Status: DC

## 2019-03-12 MED ORDER — ALBUTEROL SULFATE HFA 108 (90 BASE) MCG/ACT IN AERS: INHALATION_SPRAY | RESPIRATORY_TRACT | 0 refills | Status: DC

## 2019-03-15 ENCOUNTER — Ambulatory Visit
Admission: RE | Admit: 2019-03-15 | Discharge: 2019-03-15 | Disposition: A | Discharge status: Home / Self Care | Payer: No Typology Code available for payment source | Source: Ambulatory Visit | Attending: Obstetrics and Gynecology | Admitting: Obstetrics and Gynecology

## 2019-03-15 ENCOUNTER — Other Ambulatory Visit: Payer: Self-pay

## 2019-03-15 ENCOUNTER — Other Ambulatory Visit: Payer: Self-pay | Admitting: Obstetrics and Gynecology

## 2019-03-15 DIAGNOSIS — N6459 Other signs and symptoms in breast: Secondary | ICD-10-CM

## 2019-03-15 DIAGNOSIS — Z1231 Encounter for screening mammogram for malignant neoplasm of breast: Secondary | ICD-10-CM

## 2019-03-16 ENCOUNTER — Telehealth: Payer: Self-pay | Admitting: Obstetrics and Gynecology

## 2019-03-16 NOTE — Telephone Encounter (Signed)
[facility] contact [facility] about the diagnostic mammogram and left breast US order I was sent to sign.   I have not evaluated the patient for breast thickening.   She was last seen in the office 05/14/18.   If she is having a breast problem, she needs to be seen in the office.

## 2019-03-18 NOTE — Telephone Encounter (Signed)
Spoke with patient. Patient reports thickening on the left breast, "just feels different, just noticed recently". Hx left breast lumpectomy. Denies redness, nipple d/c or lumps. Advised per Dr. Quincy Simmonds. OV scheduled for 8/4 at 11:30am with Dr. Quincy Simmonds.   Covid 19 prescreen negative, precautions reviewed.  Routing to provider for final review. Patient is agreeable to disposition. Will close encounter.

## 2019-03-19 ENCOUNTER — Encounter: Payer: Self-pay | Admitting: Obstetrics and Gynecology

## 2019-03-19 ENCOUNTER — Other Ambulatory Visit: Payer: Self-pay

## 2019-03-19 ENCOUNTER — Ambulatory Visit (INDEPENDENT_AMBULATORY_CARE_PROVIDER_SITE_OTHER): Payer: No Typology Code available for payment source | Admitting: Obstetrics and Gynecology

## 2019-03-19 ENCOUNTER — Telehealth: Payer: Self-pay | Admitting: Obstetrics and Gynecology

## 2019-03-19 ENCOUNTER — Other Ambulatory Visit: Payer: Self-pay | Admitting: Family Medicine

## 2019-03-19 VITALS — BP 148/90 | HR 72 | Temp 97.4°F | Resp 14 | Ht 59.0 in

## 2019-03-19 DIAGNOSIS — N644 Mastodynia: Secondary | ICD-10-CM

## 2019-03-19 DIAGNOSIS — N6320 Unspecified lump in the left breast, unspecified quadrant: Secondary | ICD-10-CM

## 2019-03-19 NOTE — Telephone Encounter (Signed)
Please contact patient to schedule bilateral dx mammogram and left breast US.  She has a ridge and change in her left breast at a prior biopsy site.  She is out of town from 03/23/19 - 03/30/19.   [facility].

## 2019-03-19 NOTE — Telephone Encounter (Signed)
Spoke with Benjamine Mola at [facility]. Patient scheduled for bilateral Dx MMG and left breast US, if needed, on 04/01/19 at 11:20am, arrive at 11am.

## 2019-03-19 NOTE — Telephone Encounter (Signed)
Spoke with patient, advised of appt as seen below, patient is agreeable to date and time.   Routing to provider for final review. Patient is agreeable to disposition. Will close encounter.

## 2019-03-19 NOTE — Progress Notes (Addendum)
GYNECOLOGY  VISIT   HPI: 48 y.o.   Married  Caucasian  female   [id number] with Patient's last menstrual period was 09/26/2015 (exact date).   here for   Left breast check   Due for her mammogram.   She noticed a change in the lumpectomy area.  Not painful.   Status post biopsy 2 years ago.   GYNECOLOGIC HISTORY: Patient's last menstrual period was 09/26/2015 (exact date). Contraception:  Hysterectomy  Menopausal hormone therapy:  None  Last mammogram:  01-29-18 density C/BIRADS 1 negative  Last pap smear:   08-20-15 negative, HR HPV negative         OB History    Gravida  3   Para  3   Term  3   Preterm      AB      Living        SAB      TAB      Ectopic      Multiple      Live Births                 Patient Active Problem List   Diagnosis Date Noted  . Vitamin D deficiency 12/14/2017  . 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 Genetic testing 06/20/2016  . Family history of breast cancer in female 05/12/2016  . Family history of colorectal cancer 05/12/2016  . Fatty liver 02/22/2016  . Abnormal mammogram of left breast 01/22/2016  . Breast cyst 11/21/2014  . Menorrhagia 11/21/2014  . Hypothyroidism 11/21/2014  . Obesity 11/21/2014  . Seasonal allergies 11/21/2014  . Asthma, chronic 11/21/2014  . Generalized anxiety disorder 11/21/2014    Past Medical History:  Diagnosis Date  . Abnormal mammogram of left breast 01/22/2016  . Anemia 11/21/2014   -with elevated platelets   . Anxiety   . Asthma   . Atypical nevus 10/10/2016   Left Inner Thigh - Mild  . Breast cyst 11/21/2014   -L breast, upper L just off from center -stable per radiology 10/2014   . Constipation   . Dyspnea   . Fatty liver   . Gallbladder problem   . Gallstone pancreatitis 03/14/2016  . Generalized anxiety disorder 11/21/2014  . GERD (gastroesophageal reflux disease)   . Hypothyroidism   . IBS (irritable bowel syndrome)   . Menorrhagia 11/21/2014   -seeing gyn    . Obesity 11/21/2014  .  Plantar fasciitis of left foot 11/21/2014  . Seasonal allergies 11/21/2014  . 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 Thyroid disease     Past Surgical History:  Procedure Laterality Date  . ABDOMINAL HYSTERECTOMY    . BREAST BIOPSY    . BREAST EXCISIONAL BIOPSY    . BREAST LUMPECTOMY WITH RADIOACTIVE SEED LOCALIZATION Left 01/22/2016   Procedure: LEFT BREAST LUMPECTOMY WITH RADIOACTIVE SEED LOCALIZATION;  Surgeon: Fanny Skates, MD;  Location: [facility] [facility];  Service: General;  Laterality: Left;  . CESAREAN SECTION     x3  . CHOLECYSTECTOMY N/A 03/15/2016   Procedure: LAPAROSCOPIC CHOLECYSTECTOMY WITH INTRAOPERATIVE CHOLANGIOGRAM;  Surgeon: Jackolyn Confer, MD;  Location: [facility];  Service: General;  Laterality: N/A;  . CYSTO N/A 10/06/2015   Procedure: Consuela Mimes;  Surgeon: Nunzio Cobbs, MD;  Location: [facility];  Service: Gynecology;  Laterality: N/A;  . TUBAL LIGATION      Current Outpatient Medications  Medication Sig Dispense Refill  . albuterol (PROVENTIL) (2.5 MG/3ML) 0.083% nebulizer solution Take 3 mLs (  2.5 mg total) by nebulization every 6 (six) hours as needed for wheezing or shortness of breath. 75 mL 0  . albuterol (VENTOLIN HFA) 108 (90 Base) MCG/ACT inhaler INHALE 1 PUFF INTO THE LUNGS EVERY 6 (SIX) HOURS AS NEEDED FOR WHEEZING OR SHORTNESS OF BREATH. 18 g 0  . levothyroxine (SYNTHROID, LEVOTHROID) 88 MCG tablet Take 1 tablet (88 mcg total) by mouth daily. 90 tablet 1  . liothyronine (CYTOMEL) 5 MCG tablet TAKE 2 TABLETS BY MOUTH EVERY MORNING WITH LEVOTHYROXINE 180 tablet 2  . loratadine (CLARITIN) 10 MG tablet Take 1 tablet (10 mg total) by mouth daily. 90 tablet 0  . venlafaxine XR (EFFEXOR-XR) 150 MG 24 hr capsule TAKE 1 CAPSULE BY MOUTH DAILY WITH BREAKFAST **MUST MAKE AN APPOINTMENT** 30 capsule 0   No current facility-administered medications for this visit.      ALLERGIES: Patient has no known  allergies.  Family History  Problem Relation Age of Onset  . Breast cancer Mother 49       s/p lump and rad  . Skin cancer Mother        non-melanoma  . Thyroid disease Mother   . Skin cancer Father        non-melanoma  . Hypertension Father   . Hyperlipidemia Father   . Breast cancer Paternal Grandmother        dx 85s; s/p mastectomy  . Cervical cancer Maternal Grandmother   . Arthritis Maternal Grandmother   . CVA Maternal Grandmother   . Stroke Maternal Grandmother 80  . Heart attack Maternal Grandfather        d. 17s  . Colon cancer Paternal Grandfather 22  . Rectal cancer Maternal Uncle 54  . Aortic aneurysm Maternal Uncle 53  . Uterine cancer Other        maternal great grandmother (MGM's mother)  . Stroke Other   . Cancer Other        maternal great grandfather (MGM's father); NOS cancer  . Breast cancer Other        maternal great grandmother (MGF's mother)  . Colon cancer Other        maternal great grandfather (MGF's father)  . Heart attack Other   . Colon cancer Other        paternal great grandfather (PGF's father)  . Heart attack Other     Social History   Socioeconomic History  . Marital status: Married    Spouse name: Elberta Fortis Mccollom  . Number of children: 3  . Years of education: Not on file  . Highest education level: Not on file  Occupational History  . Occupation: Government social research officer  Social Needs  . Financial resource strain: Not on file  . Food insecurity    Worry: Not on file    Inability: Not on file  . Transportation needs    Medical: Not on file    Non-medical: Not on file  Tobacco Use  . Smoking status: Former Smoker    Types: Cigarettes  . Smokeless tobacco: Never Used  Substance and Sexual Activity  . Alcohol use: Yes    Alcohol/week: 1.0 standard drinks    Types: 1 Glasses of wine per week    Comment: socially  . Drug use: No  . Sexual activity: Yes    Partners: Male    Birth control/protection: Surgical     Comment: Tubal  Lifestyle  . Physical activity    Days per week: Not on file    Minutes  per session: Not on file  . Stress: Not on file  Relationships  . Social Herbalist on phone: Not on file    Gets together: Not on file    Attends religious service: Not on file    Active member of club or organization: Not on file    Attends meetings of clubs or organizations: Not on file    Relationship status: Not on file  . Intimate partner violence    Fear of current or ex partner: Not on file    Emotionally abused: Not on file    Physically abused: Not on file    Forced sexual activity: Not on file  Other Topics Concern  . Not on file  Social History Narrative  . Not on file    Review of Systems  Constitutional: Negative.   HENT: Negative.   Eyes: Negative.   Respiratory: Negative.   Cardiovascular: Negative.   Gastrointestinal: Negative.   Endocrine: Negative.   Genitourinary:       Left breast thickening  Musculoskeletal: Negative.   Skin: Negative.   Allergic/Immunologic: Negative.   Neurological: Negative.   Hematological: Negative.   Psychiatric/Behavioral: Negative.     PHYSICAL EXAMINATION:    BP (!) 148/90 (BP Location: Right Arm, Patient Position: Sitting, Cuff Size: Large)   Pulse 72   Temp (!) 97.4 F (36.3 C) (Skin)   Resp 14   Ht 4\' 11"  (1.499 m)   LMP 09/26/2015 (Exact Date)   BMI 42.82 kg/m     General appearance: alert, cooperative and appears stated age   Breasts:  Right - normal appearance, no masses or tenderness, No nipple retraction or dimpling, No nipple discharge or bleeding.  No axillary adenopathy.  Left - scar with a ridge near it.  No tenderness.  No nipple retraction or dimpling, No nipple discharge or bleeding.  No axillary adenopathy.   Chaperone was present for exam.  ASSESSMENT  Left breast mass.  Hx left breast lumpectomy.  Fibrocystic change, PASH, fat necrosis.   PLAN  Will proceed with bilateral dx mammogram  and left breast US.    An After Visit Summary was printed and given to the patient.  ___15__ minutes face to face time of which over 50% was spent in counseling.

## 2019-04-01 ENCOUNTER — Ambulatory Visit
Admission: RE | Admit: 2019-04-01 | Discharge: 2019-04-01 | Disposition: A | Discharge status: Home / Self Care | Payer: No Typology Code available for payment source | Source: Ambulatory Visit | Attending: Obstetrics and Gynecology | Admitting: Obstetrics and Gynecology

## 2019-04-01 ENCOUNTER — Other Ambulatory Visit: Payer: Self-pay

## 2019-04-01 DIAGNOSIS — N6459 Other signs and symptoms in breast: Secondary | ICD-10-CM

## 2019-04-12 ENCOUNTER — Other Ambulatory Visit: Payer: Self-pay | Admitting: Endocrinology

## 2019-04-12 ENCOUNTER — Other Ambulatory Visit: Payer: Self-pay | Admitting: Family Medicine

## 2019-04-12 MED FILL — LEVOTHYROXINE 88 MCG TABLET: 88 | 90 days supply | Qty: 90 | Fill #0

## 2019-04-15 MED FILL — VENLAFAXINE HCL ER 150 MG C: 150 | 30 days supply | Qty: 30 | Fill #0

## 2019-04-22 ENCOUNTER — Encounter: Payer: Self-pay | Admitting: Family Medicine

## 2019-04-23 MED ORDER — VENLAFAXINE HCL ER 150 MG PO CP24: ORAL_CAPSULE | ORAL | 0 refills | Status: DC

## 2019-05-08 ENCOUNTER — Emergency Department (HOSPITAL_COMMUNITY): Payer: No Typology Code available for payment source

## 2019-05-08 ENCOUNTER — Encounter (HOSPITAL_COMMUNITY): Payer: Self-pay | Admitting: Emergency Medicine

## 2019-05-08 ENCOUNTER — Emergency Department (HOSPITAL_COMMUNITY)
Admission: EM | Admit: 2019-05-08 | Discharge: 2019-05-08 | Disposition: A | Discharge status: Home / Self Care | Payer: No Typology Code available for payment source | Attending: Emergency Medicine | Admitting: Emergency Medicine

## 2019-05-08 ENCOUNTER — Other Ambulatory Visit: Payer: Self-pay | Admitting: Emergency Medicine

## 2019-05-08 ENCOUNTER — Telehealth: Payer: No Typology Code available for payment source | Admitting: Family Medicine

## 2019-05-08 ENCOUNTER — Telehealth: Payer: Self-pay | Admitting: Obstetrics and Gynecology

## 2019-05-08 ENCOUNTER — Other Ambulatory Visit: Payer: Self-pay

## 2019-05-08 DIAGNOSIS — E039 Hypothyroidism, unspecified: Secondary | ICD-10-CM | POA: Diagnosis not present

## 2019-05-08 DIAGNOSIS — B349 Viral infection, unspecified: Secondary | ICD-10-CM

## 2019-05-08 DIAGNOSIS — Z79899 Other long term (current) drug therapy: Secondary | ICD-10-CM | POA: Insufficient documentation

## 2019-05-08 DIAGNOSIS — J45909 Unspecified asthma, uncomplicated: Secondary | ICD-10-CM | POA: Insufficient documentation

## 2019-05-08 DIAGNOSIS — R197 Diarrhea, unspecified: Secondary | ICD-10-CM | POA: Diagnosis not present

## 2019-05-08 DIAGNOSIS — N83202 Unspecified ovarian cyst, left side: Secondary | ICD-10-CM | POA: Insufficient documentation

## 2019-05-08 DIAGNOSIS — Z87891 Personal history of nicotine dependence: Secondary | ICD-10-CM | POA: Diagnosis not present

## 2019-05-08 DIAGNOSIS — Z20828 Contact with and (suspected) exposure to other viral communicable diseases: Secondary | ICD-10-CM | POA: Insufficient documentation

## 2019-05-08 DIAGNOSIS — R1084 Generalized abdominal pain: Secondary | ICD-10-CM | POA: Diagnosis not present

## 2019-05-08 DIAGNOSIS — Z20822 Contact with and (suspected) exposure to covid-19: Secondary | ICD-10-CM

## 2019-05-08 DIAGNOSIS — R112 Nausea with vomiting, unspecified: Secondary | ICD-10-CM | POA: Insufficient documentation

## 2019-05-08 DIAGNOSIS — E860 Dehydration: Secondary | ICD-10-CM | POA: Insufficient documentation

## 2019-05-08 DIAGNOSIS — R109 Unspecified abdominal pain: Secondary | ICD-10-CM | POA: Diagnosis not present

## 2019-05-08 LAB — WET PREP, GENITAL
Sperm: NONE SEEN
Trich, Wet Prep: NONE SEEN
WBC, Wet Prep HPF POC: NONE SEEN
Yeast Wet Prep HPF POC: NONE SEEN

## 2019-05-08 LAB — CBC
HCT: 42.3 % (ref 36.0–46.0)
Hemoglobin: 14.1 g/dL (ref 12.0–15.0)
MCH: 29.8 pg (ref 26.0–34.0)
MCHC: 33.3 g/dL (ref 30.0–36.0)
MCV: 89.4 fL (ref 80.0–100.0)
Platelets: 387 10*3/uL (ref 150–400)
RBC: 4.73 MIL/uL (ref 3.87–5.11)
RDW: 12.6 % (ref 11.5–15.5)
WBC: 10.9 10*3/uL — ABNORMAL HIGH (ref 4.0–10.5)
nRBC: 0 % (ref 0.0–0.2)

## 2019-05-08 LAB — COMPREHENSIVE METABOLIC PANEL
ALT: 28 U/L (ref 0–44)
AST: 22 U/L (ref 15–41)
Albumin: 4.1 g/dL (ref 3.5–5.0)
Alkaline Phosphatase: 69 U/L (ref 38–126)
Anion gap: 11 (ref 5–15)
BUN: 13 mg/dL (ref 6–20)
CO2: 22 mmol/L (ref 22–32)
Calcium: 9.3 mg/dL (ref 8.9–10.3)
Chloride: 105 mmol/L (ref 98–111)
Creatinine, Ser: 0.74 mg/dL (ref 0.44–1.00)
GFR calc Af Amer: >60 mL/min (ref >60)
GFR calc non Af Amer: >60 mL/min (ref >60)
Glucose, Bld: 100 mg/dL — ABNORMAL HIGH (ref 70–99)
Potassium: 3.4 mmol/L — ABNORMAL LOW (ref 3.5–5.1)
Sodium: 138 mmol/L (ref 135–145)
Total Bilirubin: 0.9 mg/dL (ref 0.3–1.2)
Total Protein: 7.4 g/dL (ref 6.5–8.1)

## 2019-05-08 LAB — URINALYSIS, ROUTINE W REFLEX MICROSCOPIC
Bilirubin Urine: NEGATIVE
Glucose, UA: NEGATIVE mg/dL
Ketones, ur: 20 mg/dL — AB
Leukocytes,Ua: NEGATIVE
Nitrite: NEGATIVE
Protein, ur: NEGATIVE mg/dL
Specific Gravity, Urine: >1.046 — ABNORMAL HIGH (ref 1.005–1.030)
pH: 5 (ref 5.0–8.0)

## 2019-05-08 LAB — SARS CORONAVIRUS 2 BY RT PCR (HOSPITAL ORDER, PERFORMED IN ~~LOC~~ HOSPITAL LAB): SARS Coronavirus 2: NEGATIVE

## 2019-05-08 LAB — LIPASE, BLOOD: Lipase: 20 U/L (ref 11–51)

## 2019-05-08 MED ORDER — ONDANSETRON HCL 4 MG/2ML IJ SOLN
4.0000 mg | Freq: Once | INTRAMUSCULAR | Status: AC
  Administered 2019-05-08: 22:00:00 4 mg via INTRAVENOUS
  Filled 2019-05-08: qty 2

## 2019-05-08 MED ORDER — IOHEXOL 300 MG/ML  SOLN
100.0000 mL | Freq: Once | INTRAMUSCULAR | Status: AC | PRN
  Administered 2019-05-08: 16:00:00 100 mL via INTRAVENOUS

## 2019-05-08 MED ORDER — ONDANSETRON HCL 4 MG/2ML IJ SOLN
4.0000 mg | Freq: Once | INTRAMUSCULAR | Status: AC
  Administered 2019-05-08: 14:00:00 4 mg via INTRAVENOUS
  Filled 2019-05-08: qty 2

## 2019-05-08 MED ORDER — SODIUM CHLORIDE 0.9 % IV BOLUS
1000.0000 mL | Freq: Once | INTRAVENOUS | Status: AC
  Administered 2019-05-08: 14:00:00 1000 mL via INTRAVENOUS

## 2019-05-08 MED ORDER — MORPHINE SULFATE (PF) 4 MG/ML IV SOLN
4.0000 mg | Freq: Once | INTRAVENOUS | Status: AC
  Administered 2019-05-08: 4 mg via INTRAVENOUS
  Filled 2019-05-08: qty 1

## 2019-05-08 MED ORDER — HYDROCODONE-ACETAMINOPHEN 5-325 MG PO TABS: 1.0000 | ORAL_TABLET | ORAL | 0 refills | Status: DC | PRN

## 2019-05-08 MED ORDER — HYDROMORPHONE HCL 1 MG/ML IJ SOLN
1.0000 mg | Freq: Once | INTRAMUSCULAR | Status: AC
  Administered 2019-05-08: 17:00:00 1 mg via INTRAVENOUS
  Filled 2019-05-08: qty 1

## 2019-05-08 MED ORDER — ONDANSETRON 4 MG PO TBDP: 4.0000 mg | ORAL_TABLET | Freq: Three times a day (TID) | ORAL | 0 refills | Status: DC | PRN

## 2019-05-08 MED ORDER — SODIUM CHLORIDE (PF) 0.9 % IJ SOLN
INTRAMUSCULAR | Status: AC
  Filled 2019-05-08: qty 50

## 2019-05-08 MED ORDER — HYDROMORPHONE HCL 1 MG/ML IJ SOLN
1.0000 mg | Freq: Once | INTRAMUSCULAR | Status: AC
  Administered 2019-05-08: 22:00:00 1 mg via INTRAVENOUS
  Filled 2019-05-08: qty 1

## 2019-05-08 NOTE — ED Triage Notes (Signed)
Pt c/o abd pains with n/v/d since last week.

## 2019-05-08 NOTE — ED Notes (Signed)
Ultrasound and bedside.

## 2019-05-08 NOTE — Consult Note (Signed)
GYNECOLOGY  VISIT   HPI: 48 y.o.   Married  Caucasian  female   G3P3000 with Patient's last menstrual period was 09/26/2015 (exact date).   Status post total abdominal hysterectomy with bilateral salpingectomy, extensive lysis of adhesions, and cystoscopy on 10/06/15 and status post laparoscopic cholecystectomy 03/15/16, who presents to the emergency department with 2 week history of nausea, vomiting and diarrhea preceded by fatigue.  She states fever and malaise.  She reports right upper quadrant pain that has become more generalized and radiating into her back.  She feels bloated.  She has received morphine and dilaudid for pain control in the ER.  She is starting to feel hunger, but states that food prompted immediate diarrhea when she last ate at about 7:00 pm last evening.   No known exposures to Covid 19.  She is working at a salon during the pandemic.  Her family is well.  Tonight in the ER, she has had a CT scan showing a 2 mm nonobstructing renal calculus and a 7.4 x 4.7 cm left adnexal cyst.  Her right ovary, appendix, and bowel are normal.   A follow up pelvic ultrasound showed a mildly complicated adnexal cyst replacing the left ovary and measuring 7.3 x 5.8 x 7.1 cm.  There was limited arterial and venous blood flow at the margin of the cyst, but the study was limited by lack of significant ovarian tissue to complete the study. There was no free fluid.    Her WBC is 10.9, Hgb 14.1. Urine showed moderate blood and rare bacteria.   She states a history of C difficile treated by Dr. Ardis Hughs for about 3 months this spring.  She has a history of a renal stone.    GYNECOLOGIC HISTORY: Patient's last menstrual period was 09/26/2015 (exact date). Contraception: hysterectomy.  Menopausal hormone therapy:  NA Last mammogram:  Dx bilateral mammogram and left breast US 04/01/19 - BI-RADS2.           OB History    Gravida  3   Para  3   Term  3   Preterm      AB      Living         SAB      TAB      Ectopic      Multiple      Live Births                 Patient Active Problem List   Diagnosis Date Noted  . Vitamin D deficiency 12/14/2017  . 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 Genetic testing 06/20/2016  . Family history of breast cancer in female 05/12/2016  . Family history of colorectal cancer 05/12/2016  . Fatty liver 02/22/2016  . Abnormal mammogram of left breast 01/22/2016  . Breast cyst 11/21/2014  . Menorrhagia 11/21/2014  . Hypothyroidism 11/21/2014  . Obesity 11/21/2014  . Seasonal allergies 11/21/2014  . Asthma, chronic 11/21/2014  . Generalized anxiety disorder 11/21/2014    Past Medical History:  Diagnosis Date  . Abnormal mammogram of left breast 01/22/2016  . Anemia 11/21/2014   -with elevated platelets   . Anxiety   . Asthma   . Atypical nevus 10/10/2016   Left Inner Thigh - Mild  . Breast cyst 11/21/2014   -L breast, upper L just off from center -stable per radiology 10/2014   . Constipation   . Dyspnea   . Fatty liver   . Gallbladder problem   . Gallstone  pancreatitis 03/14/2016  . Generalized anxiety disorder 11/21/2014  . GERD (gastroesophageal reflux disease)   . Hypothyroidism   . IBS (irritable bowel syndrome)   . Menorrhagia 11/21/2014   -seeing gyn    . Obesity 11/21/2014  . Plantar fasciitis of left foot 11/21/2014  . Seasonal allergies 11/21/2014  . 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 Thyroid disease     Past Surgical History:  Procedure Laterality Date  . ABDOMINAL HYSTERECTOMY    . BREAST BIOPSY    . BREAST EXCISIONAL BIOPSY    . BREAST LUMPECTOMY WITH RADIOACTIVE SEED LOCALIZATION Left 01/22/2016   Procedure: LEFT BREAST LUMPECTOMY WITH RADIOACTIVE SEED LOCALIZATION;  Surgeon: Fanny Skates, MD;  Location: [facility];  Service: General;  Laterality: Left;  . CESAREAN SECTION     x3  . CHOLECYSTECTOMY N/A 03/15/2016   Procedure: LAPAROSCOPIC  CHOLECYSTECTOMY WITH INTRAOPERATIVE CHOLANGIOGRAM;  Surgeon: Jackolyn Confer, MD;  Location: [facility];  Service: General;  Laterality: N/A;  . CYSTO N/A 10/06/2015   Procedure: Consuela Mimes;  Surgeon: Nunzio Cobbs, MD;  Location: [facility];  Service: Gynecology;  Laterality: N/A;  . TUBAL LIGATION      Current Facility-Administered Medications  Medication Dose Route Frequency Provider Last Rate Last Dose  . sodium chloride (PF) 0.9 % injection            Current Outpatient Medications  Medication Sig Dispense Refill  . albuterol (PROVENTIL) (2.5 MG/3ML) 0.083% nebulizer solution Take 3 mLs (2.5 mg total) by nebulization every 6 (six) hours as needed for wheezing or shortness of breath. 75 mL 0  . albuterol (VENTOLIN HFA) 108 (90 Base) MCG/ACT inhaler INHALE 1 PUFF INTO THE LUNGS EVERY 6 (SIX) HOURS AS NEEDED FOR WHEEZING OR SHORTNESS OF BREATH. (Patient taking differently: Inhale 1 puff into the lungs every 6 (six) hours as needed for wheezing or shortness of breath. ) 18 g 0  . levothyroxine (SYNTHROID) 88 MCG tablet TAKE 1 TABLET BY MOUTH DAILY. (Patient taking differently: Take 88 mcg by mouth daily. ) 90 tablet 1  . liothyronine (CYTOMEL) 5 MCG tablet TAKE 2 TABLETS BY MOUTH EVERY MORNING WITH LEVOTHYROXINE (Patient taking differently: Take 10 mcg by mouth daily. ) 180 tablet 2  . loratadine (CLARITIN) 10 MG tablet Take 1 tablet (10 mg total) by mouth daily. 90 tablet 0  . venlafaxine XR (EFFEXOR-XR) 150 MG 24 hr capsule TAKE 1 CAPSULE BY MOUTH DAILY WITH BREAKFAST **MUST MAKE AN APPOINTMENT** (Patient taking differently: Take 150 mg by mouth daily with breakfast. ) 30 capsule 0     ALLERGIES: Patient has no known allergies.  Family History  Problem Relation Age of Onset  . Breast cancer Mother 86       s/p lump and rad  . Skin cancer Mother        non-melanoma  . Thyroid disease Mother   . Skin cancer Father        non-melanoma  . Hypertension Father   . Hyperlipidemia Father    . Breast cancer Paternal Grandmother        dx 84s; s/p mastectomy  . Cervical cancer Maternal Grandmother   . Arthritis Maternal Grandmother   . CVA Maternal Grandmother   . Stroke Maternal Grandmother 80  . Heart attack Maternal Grandfather        d. 5s  . Colon cancer Paternal Grandfather 18  . Rectal cancer Maternal Uncle 54  . Aortic aneurysm Maternal Uncle  26  . Uterine cancer Other        maternal great grandmother (MGM's mother)  . Stroke Other   . Cancer Other        maternal great grandfather (MGM's father); NOS cancer  . Breast cancer Other        maternal great grandmother (MGF's mother)  . Colon cancer Other        maternal great grandfather (MGF's father)  . Heart attack Other   . Colon cancer Other        paternal great grandfather (PGF's father)  . Heart attack Other     Social History   Socioeconomic History  . Marital status: Married    Spouse name: Elberta Fortis Hilmes  . Number of children: 3  . Years of education: Not on file  . Highest education level: Not on file  Occupational History  . Occupation: Government social research officer  Social Needs  . Financial resource strain: Not on file  . Food insecurity    Worry: Not on file    Inability: Not on file  . Transportation needs    Medical: Not on file    Non-medical: Not on file  Tobacco Use  . Smoking status: Former Smoker    Types: Cigarettes  . Smokeless tobacco: Never Used  Substance and Sexual Activity  . Alcohol use: Yes    Alcohol/week: 1.0 standard drinks    Types: 1 Glasses of wine per week    Comment: socially  . Drug use: No  . Sexual activity: Yes    Partners: Male    Birth control/protection: Surgical    Comment: Tubal  Lifestyle  . Physical activity    Days per week: Not on file    Minutes per session: Not on file  . Stress: Not on file  Relationships  . Social Herbalist on phone: Not on file    Gets together: Not on file    Attends religious service: Not on file     Active member of club or organization: Not on file    Attends meetings of clubs or organizations: Not on file    Relationship status: Not on file  . Intimate partner violence    Fear of current or ex partner: Not on file    Emotionally abused: Not on file    Physically abused: Not on file    Forced sexual activity: Not on file  Other Topics Concern  . Not on file  Social History Narrative  . Not on file    Review of Systems  See HPI.   PHYSICAL EXAMINATION:    BP (!) 147/98 (BP Location: Right Arm)   Pulse 77   Temp 98.1 F (36.7 C) (Oral)   Resp 18   LMP 09/26/2015 (Exact Date)   SpO2 99%     General appearance: alert, cooperative and appears stated age Head: Normocephalic, without obvious abnormality, atraumatic Lungs: clear to auscultation bilaterally Heart: regular rate and rhythm Abdomen: Normal active bowel sounds, soft, mildly tender with no guarding or rebound, no masses,  no organomegaly Extremities: extremities normal, atraumatic, no cyanosis or edema Skin: Skin color, texture, turgor normal. No rashes or lesions No abnormal inguinal nodes palpated Neurologic: Grossly normal  Pelvic: External genitalia:  no lesions              Urethra:  normal appearing urethra with no masses, tenderness or lesions  Bartholins and Skenes: normal                 Vagina: normal appearing vagina with normal color and discharge, no lesions              Cervix:  absent                Bimanual Exam:  Uterus:   absent              Adnexa:  Fullness in the pelvis which is mildly tender.               Rectal exam: Yes.  .  Confirms.              Anus:  normal sphincter tone, no lesions  Chaperone was present for exam.  ASSESSMENT  Possible viral syndrome.  Recent C difficile.  Large left ovarian cyst.  Not typical presentation for ovarian torsion.  Hx nonobstructing right renal stone.   PLAN  Patient is undergoing rapid Covid testing in the ER tonight.  Wet  prep and GC/CT collected in the ER. She will be seen as an outpatient for a follow up ultrasound in office during the next week.  I have given her torsion precautions.  She will be sent home with oral narcotic pain medication through the ER.    An After Visit Summary was printed and given to the patient.  __30____ minutes face to face time of which over 50% was spent in counseling.

## 2019-05-08 NOTE — ED Provider Notes (Signed)
[facility] DEPT Provider Note   CSN: [id number] Arrival date & time: 05/08/19  1204     History   Chief Complaint Chief Complaint  Patient presents with   Abdominal Pain   Emesis   Diarrhea    HPI Maria [name] is a 48 y.o. female.     Pt presents to the ED today with abdominal pain, n/v/d.  The pt said she's been hurting for the last few days.  She has had intermittent fevers.  No known covid exposures.  No cough or sob.     Past Medical History:  Diagnosis Date   Abnormal mammogram of left breast 01/22/2016   Anemia 11/21/2014   -with elevated platelets    Anxiety    Asthma    Atypical nevus 10/10/2016   Left Inner Thigh - Mild   Breast cyst 11/21/2014   -L breast, upper L just off from center -stable per radiology 10/2014    Constipation    Dyspnea    Fatty liver    Gallbladder problem    Gallstone pancreatitis 03/14/2016   Generalized anxiety disorder 11/21/2014   GERD (gastroesophageal reflux disease)    Hypothyroidism    IBS (irritable bowel syndrome)    Menorrhagia 11/21/2014   -seeing gyn     Obesity 11/21/2014   Plantar fasciitis of left foot 11/21/2014   Seasonal allergies 11/21/2014   Status post total abdominal hysterectomy and bilateral salpingo-oophorectomy 10/06/2015   Thyroid disease     Patient Active Problem List   Diagnosis Date Noted   Vitamin D deficiency 12/14/2017   Insulin resistance 12/14/2017   Genetic testing 06/20/2016   Family history of breast cancer in female 05/12/2016   Family history of colorectal cancer 05/12/2016   Fatty liver 02/22/2016   Abnormal mammogram of left breast 01/22/2016   Breast cyst 11/21/2014   Menorrhagia 11/21/2014   Hypothyroidism 11/21/2014   Obesity 11/21/2014   Seasonal allergies 11/21/2014   Asthma, chronic 11/21/2014   Generalized anxiety disorder 11/21/2014    Past Surgical History:  Procedure Laterality Date   ABDOMINAL  HYSTERECTOMY     BREAST BIOPSY     BREAST EXCISIONAL BIOPSY     BREAST LUMPECTOMY WITH RADIOACTIVE SEED LOCALIZATION Left 01/22/2016   Procedure: LEFT BREAST LUMPECTOMY WITH RADIOACTIVE SEED LOCALIZATION;  Surgeon: Fanny Skates, MD;  Location: [facility];  Service: General;  Laterality: Left;   CESAREAN SECTION     x3   CHOLECYSTECTOMY N/A 03/15/2016   Procedure: LAPAROSCOPIC CHOLECYSTECTOMY WITH INTRAOPERATIVE CHOLANGIOGRAM;  Surgeon: Jackolyn Confer, MD;  Location: [facility];  Service: General;  Laterality: N/A;   CYSTO N/A 10/06/2015   Procedure: Consuela Mimes;  Surgeon: Nunzio Cobbs, MD;  Location: [facility];  Service: Gynecology;  Laterality: N/A;   TUBAL LIGATION       OB History    Gravida  3   Para  3   Term  3   Preterm      AB      Living        SAB      TAB      Ectopic      Multiple      Live Births               Home Medications    Prior to Admission medications   Medication Sig Start Date End Date Taking? Authorizing Provider  albuterol (PROVENTIL) (2.5 MG/3ML) 0.083% nebulizer solution Take 3 mLs (2.5  mg total) by nebulization every 6 (six) hours as needed for wheezing or shortness of breath. 08/25/18  Yes Orma Flaming, MD  albuterol (VENTOLIN HFA) 108 (90 Base) MCG/ACT inhaler INHALE 1 PUFF INTO THE LUNGS EVERY 6 (SIX) HOURS AS NEEDED FOR WHEEZING OR SHORTNESS OF BREATH. Patient taking differently: Inhale 1 puff into the lungs every 6 (six) hours as needed for wheezing or shortness of breath.  03/12/19  Yes Lucretia Kern, DO  levothyroxine (SYNTHROID) 88 MCG tablet TAKE 1 TABLET BY MOUTH DAILY. Patient taking differently: Take 88 mcg by mouth daily.  04/12/19  Yes Elayne Snare, MD  liothyronine (CYTOMEL) 5 MCG tablet TAKE 2 TABLETS BY MOUTH EVERY MORNING WITH LEVOTHYROXINE Patient taking differently: Take 10 mcg by mouth daily.  09/12/18  Yes Elayne Snare, MD  loratadine (CLARITIN) 10 MG tablet Take 1 tablet (10 mg total) by  mouth daily. 11/21/14  Yes Colin Benton R, DO  venlafaxine XR (EFFEXOR-XR) 150 MG 24 hr capsule TAKE 1 CAPSULE BY MOUTH DAILY WITH BREAKFAST **MUST MAKE AN APPOINTMENT** Patient taking differently: Take 150 mg by mouth daily with breakfast.  04/23/19  Yes Lucretia Kern, DO  HYDROcodone-acetaminophen (NORCO/VICODIN) 5-325 MG tablet Take 1 tablet by mouth every 4 (four) hours as needed. 05/08/19   Isla Pence, MD  ondansetron (ZOFRAN ODT) 4 MG disintegrating tablet Take 1 tablet (4 mg total) by mouth every 8 (eight) hours as needed. 05/08/19   Isla Pence, MD    Family History Family History  Problem Relation Age of Onset   Breast cancer Mother 80       s/p lump and rad   Skin cancer Mother        non-melanoma   Thyroid disease Mother    Skin cancer Father        non-melanoma   Hypertension Father    Hyperlipidemia Father    Breast cancer Paternal Grandmother        dx 46s; s/p mastectomy   Cervical cancer Maternal Grandmother    Arthritis Maternal Grandmother    CVA Maternal Grandmother    Stroke Maternal Grandmother 80   Heart attack Maternal Grandfather        d. 75s   Colon cancer Paternal Grandfather 77   Rectal cancer Maternal Uncle 54   Aortic aneurysm Maternal Uncle 53   Uterine cancer Other        maternal great grandmother (MGM's mother)   Stroke Other    Cancer Other        maternal great grandfather (MGM's father); NOS cancer   Breast cancer Other        maternal great grandmother (MGF's mother)   Colon cancer Other        maternal great grandfather (MGF's father)   Heart attack Other    Colon cancer Other        paternal great grandfather (PGF's father)   Heart attack Other     Social History Social History   Tobacco Use   Smoking status: Former Smoker    Types: Cigarettes   Smokeless tobacco: Never Used  Substance Use Topics   Alcohol use: Yes    Alcohol/week: 1.0 standard drinks    Types: 1 Glasses of wine per week     Comment: socially   Drug use: No     Allergies   Patient has no known allergies.   Review of Systems Review of Systems  Gastrointestinal: Positive for abdominal pain, diarrhea, nausea and vomiting.  All  other systems reviewed and are negative.    Physical Exam Updated Vital Signs BP (!) 147/98 (BP Location: Right Arm)    Pulse 77    Temp 98.1 F (36.7 C) (Oral)    Resp 18    LMP 09/26/2015 (Exact Date)    SpO2 99%   Physical Exam Vitals signs and nursing note reviewed.  Constitutional:      Appearance: She is well-developed.  HENT:     Head: Normocephalic and atraumatic.     Mouth/Throat:     Mouth: Mucous membranes are dry.  Eyes:     Extraocular Movements: Extraocular movements intact.     Pupils: Pupils are equal, round, and reactive to light.  Cardiovascular:     Rate and Rhythm: Normal rate and regular rhythm.  Pulmonary:     Effort: Pulmonary effort is normal.     Breath sounds: Normal breath sounds.  Abdominal:     General: Abdomen is flat. Bowel sounds are normal.     Palpations: Abdomen is soft.     Tenderness: There is generalized abdominal tenderness.  Skin:    General: Skin is warm.     Capillary Refill: Capillary refill takes less than 2 seconds.  Neurological:     General: No focal deficit present.     Mental Status: She is alert and oriented to person, place, and time.  Psychiatric:        Mood and Affect: Mood normal.        Behavior: Behavior normal.      ED Treatments / Results  Labs (all labs ordered are listed, but only abnormal results are displayed) Labs Reviewed  COMPREHENSIVE METABOLIC PANEL - Abnormal; Notable for the following components:      Result Value   Potassium 3.4 (*)    Glucose, Bld 100 (*)    All other components within normal limits  CBC - Abnormal; Notable for the following components:   WBC 10.9 (*)    All other components within normal limits  URINALYSIS, ROUTINE W REFLEX MICROSCOPIC - Abnormal; Notable for the  following components:   Color, Urine STRAW (*)    Specific Gravity, Urine >1.046 (*)    Hgb urine dipstick MODERATE (*)    Ketones, ur 20 (*)    Bacteria, UA RARE (*)    All other components within normal limits  SARS CORONAVIRUS 2 (HOSPITAL ORDER, [facility])  WET PREP, GENITAL  LIPASE, BLOOD  GC/CHLAMYDIA PROBE AMP ([place]) NOT AT [facility]    EKG None  Radiology US Transvaginal Non-ob  Result Date: 05/08/2019 CLINICAL DATA:  Pain for 2 weeks, abnormal CT EXAM: TRANSABDOMINAL AND TRANSVAGINAL ULTRASOUND OF PELVIS DOPPLER ULTRASOUND OF OVARIES TECHNIQUE: Both transabdominal and transvaginal ultrasound examinations of the pelvis were performed. Transabdominal technique was performed for global imaging of the pelvis including uterus, ovaries, adnexal regions, and pelvic cul-de-sac. It was necessary to proceed with endovaginal exam following the transabdominal exam to visualize the ovaries. Color and duplex Doppler ultrasound was utilized to evaluate blood flow to the ovaries. COMPARISON:  CT abdomen and pelvis 05/08/2019 FINDINGS: Uterus Surgically absent Endometrium N/A Right ovary Not visualized on either transabdominal or endovaginal imaging, likely obscured by bowel; RIGHT ovary was normal in appearance by preceding CT exam. Left ovary 7.3 x 5.8 x 7.1 cm = volume 156 mL. LEFT ovary replaced by a large cyst. Minimal surrounding normal ovarian tissue. Cyst contains scattered low level internal echoes/debris. No definite mural nodules or  septations. Pulsed Doppler evaluation of the RIGHT ovary could not be performed since this was not localized sonographically. Doppler assessment of the LEFT ovary is limited by lack of significant surrounding ovarian tissue, ovary predominantly replaced by the large minimally complicated cyst. Arterial venous flow appear to be present at the margin of the cyst though this is suboptimally assessed. Other findings No free pelvic fluid or  additional adnexal masses. IMPRESSION: Surgical absence of uterus with nonvisualization of the normal RIGHT ovary which was visualized on CT. Mildly complicated cyst predominantly replacing the LEFT ovary, 7.3 x 5.8 x 7.1 cm in size. There appears to be limited arterial and venous blood flow at the margin of the lesion on pulsed Doppler imaging, though duplex ultrasound assessment significantly limited by the lack of significant ovarian tissue. Electronically Signed   By: Lavonia Dana M.D.   On: 05/08/2019 17:33   US Pelvis Complete  Result Date: 05/08/2019 CLINICAL DATA:  Pain for 2 weeks, abnormal CT EXAM: TRANSABDOMINAL AND TRANSVAGINAL ULTRASOUND OF PELVIS DOPPLER ULTRASOUND OF OVARIES TECHNIQUE: Both transabdominal and transvaginal ultrasound examinations of the pelvis were performed. Transabdominal technique was performed for global imaging of the pelvis including uterus, ovaries, adnexal regions, and pelvic cul-de-sac. It was necessary to proceed with endovaginal exam following the transabdominal exam to visualize the ovaries. Color and duplex Doppler ultrasound was utilized to evaluate blood flow to the ovaries. COMPARISON:  CT abdomen and pelvis 05/08/2019 FINDINGS: Uterus Surgically absent Endometrium N/A Right ovary Not visualized on either transabdominal or endovaginal imaging, likely obscured by bowel; RIGHT ovary was normal in appearance by preceding CT exam. Left ovary 7.3 x 5.8 x 7.1 cm = volume 156 mL. LEFT ovary replaced by a large cyst. Minimal surrounding normal ovarian tissue. Cyst contains scattered low level internal echoes/debris. No definite mural nodules or septations. Pulsed Doppler evaluation of the RIGHT ovary could not be performed since this was not localized sonographically. Doppler assessment of the LEFT ovary is limited by lack of significant surrounding ovarian tissue, ovary predominantly replaced by the large minimally complicated cyst. Arterial venous flow appear to be present  at the margin of the cyst though this is suboptimally assessed. Other findings No free pelvic fluid or additional adnexal masses. IMPRESSION: Surgical absence of uterus with nonvisualization of the normal RIGHT ovary which was visualized on CT. Mildly complicated cyst predominantly replacing the LEFT ovary, 7.3 x 5.8 x 7.1 cm in size. There appears to be limited arterial and venous blood flow at the margin of the lesion on pulsed Doppler imaging, though duplex ultrasound assessment significantly limited by the lack of significant ovarian tissue. Electronically Signed   By: Lavonia Dana M.D.   On: 05/08/2019 17:33   Ct Abdomen Pelvis W Contrast  Result Date: 05/08/2019 CLINICAL DATA:  Pt c/o abd pains with n/v/d since last week.^151mL OMNIPAQUE IOHEXOL 300 MG/ML SOLN EXAM: CT ABDOMEN AND PELVIS WITH CONTRAST TECHNIQUE: Multidetector CT imaging of the abdomen and pelvis was performed using the standard protocol following bolus administration of intravenous contrast. CONTRAST:  175mL OMNIPAQUE IOHEXOL 300 MG/ML  SOLN COMPARISON:  CT abdomen pelvis 12/11/2015 FINDINGS: Lower chest: No acute abnormality. Hepatobiliary: No focal liver abnormality is seen. Status post cholecystectomy. No biliary dilatation. Pancreas: Unremarkable. No pancreatic ductal dilatation or surrounding inflammatory changes. Spleen: Normal in size without focal abnormality. Adrenals/Urinary Tract: Adrenal glands are unremarkable. Kidneys are symmetric in size. No hydronephrosis. There is a nonobstructing left 2 mm renal calculus. Bladder is unremarkable. Stomach/Bowel: Stomach is within  normal limits. Appendix appears normal. No evidence of bowel wall thickening, distention, or inflammatory changes. Vascular/Lymphatic: No significant vascular findings are present. No enlarged abdominal or pelvic lymph nodes. Reproductive: Status post hysterectomy. There is a large cyst in the left adnexa measuring 7.4 x 4.7 cm, likely within the left ovary.  Normal appearance of the right ovary. Other: No abdominal wall hernia or abnormality. No abdominopelvic ascites. Musculoskeletal: No acute or significant osseous findings. IMPRESSION: 1. There is a large cyst in the left adnexa measuring 7.4 x 4.7 cm, likely within the left ovary. Further evaluation with pelvic ultrasound is recommended to exclude ovarian torsion. 2.  Nonobstructing left nephrolithiasis. Electronically Signed   By: Audie Pinto M.D.   On: 05/08/2019 16:14   [name] Art/ven Flow Abd Pelv Doppler  Result Date: 05/08/2019 CLINICAL DATA:  Pain for 2 weeks, abnormal CT EXAM: TRANSABDOMINAL AND TRANSVAGINAL ULTRASOUND OF PELVIS DOPPLER ULTRASOUND OF OVARIES TECHNIQUE: Both transabdominal and transvaginal ultrasound examinations of the pelvis were performed. Transabdominal technique was performed for global imaging of the pelvis including uterus, ovaries, adnexal regions, and pelvic cul-de-sac. It was necessary to proceed with endovaginal exam following the transabdominal exam to visualize the ovaries. Color and duplex Doppler ultrasound was utilized to evaluate blood flow to the ovaries. COMPARISON:  CT abdomen and pelvis 05/08/2019 FINDINGS: Uterus Surgically absent Endometrium N/A Right ovary Not visualized on either transabdominal or endovaginal imaging, likely obscured by bowel; RIGHT ovary was normal in appearance by preceding CT exam. Left ovary 7.3 x 5.8 x 7.1 cm = volume 156 mL. LEFT ovary replaced by a large cyst. Minimal surrounding normal ovarian tissue. Cyst contains scattered low level internal echoes/debris. No definite mural nodules or septations. Pulsed Doppler evaluation of the RIGHT ovary could not be performed since this was not localized sonographically. Doppler assessment of the LEFT ovary is limited by lack of significant surrounding ovarian tissue, ovary predominantly replaced by the large minimally complicated cyst. Arterial venous flow appear to be present at the margin of the  cyst though this is suboptimally assessed. Other findings No free pelvic fluid or additional adnexal masses. IMPRESSION: Surgical absence of uterus with nonvisualization of the normal RIGHT ovary which was visualized on CT. Mildly complicated cyst predominantly replacing the LEFT ovary, 7.3 x 5.8 x 7.1 cm in size. There appears to be limited arterial and venous blood flow at the margin of the lesion on pulsed Doppler imaging, though duplex ultrasound assessment significantly limited by the lack of significant ovarian tissue. Electronically Signed   By: Lavonia Dana M.D.   On: 05/08/2019 17:33    Procedures Procedures (including critical care time)  Medications Ordered in ED Medications  sodium chloride (PF) 0.9 % injection (has no administration in time range)  ondansetron (ZOFRAN) injection 4 mg (has no administration in time range)  HYDROmorphone (DILAUDID) injection 1 mg (has no administration in time range)  sodium chloride 0.9 % bolus 1,000 mL (0 mLs Intravenous Stopped 05/08/19 1556)  morphine 4 MG/ML injection 4 mg (4 mg Intravenous Given 05/08/19 1415)  ondansetron (ZOFRAN) injection 4 mg (4 mg Intravenous Given 05/08/19 1415)  iohexol (OMNIPAQUE) 300 MG/ML solution 100 mL (100 mLs Intravenous Contrast Given 05/08/19 1547)  HYDROmorphone (DILAUDID) injection 1 mg (1 mg Intravenous Given 05/08/19 1638)     Initial Impression / Assessment and Plan / ED Course  I have reviewed the triage vital signs and the nursing notes.  Pertinent labs & imaging results that were available during my care of the  patient were reviewed by me and considered in my medical decision making (see chart for details).      Due to the abdominal pain, I ordered a CT abd/pelvis which showed a large left ovarian cyst.  The radiologist recommended an [name] to r/o torsion.  This was done.  The [name] could not rule it out.  I spoke to pt's ObGyn (Dr. Quincy Simmonds) who came in to see pt.  She does not think her pain is from a torsion.   The office will call her tomorrow to check on her.  She does recommend follow up in the office for a repeat US next week.    Pt does work at [employer], but they have been requiring masks.  Pt could have covid, so a swab was done.  Test is pending.  Pt is stable for d/c.  Return if worse.  Maria [name] was evaluated in Emergency Department on 05/08/2019 for the symptoms described in the history of present illness. She was evaluated in the context of the global COVID-19 pandemic, which necessitated consideration that the patient might be at risk for infection with the SARS-CoV-2 virus that causes COVID-19. Institutional protocols and algorithms that pertain to the evaluation of patients at risk for COVID-19 are in a state of rapid change based on information released by regulatory bodies including the CDC and federal and state organizations. These policies and algorithms were followed during the patient's care in the ED. Final Clinical Impressions(s) / ED Diagnoses   Final diagnoses:  Generalized abdominal pain  Cyst of left ovary  Nausea vomiting and diarrhea  Dehydration    ED Discharge Orders         Ordered    HYDROcodone-acetaminophen (NORCO/VICODIN) 5-325 MG tablet  Every 4 hours PRN     05/08/19 2127    ondansetron (ZOFRAN ODT) 4 MG disintegrating tablet  Every 8 hours PRN     05/08/19 2127           Isla Pence, MD 05/08/19 2129

## 2019-05-08 NOTE — Telephone Encounter (Signed)
Please contact the patient in follow up to her ER visit 05/08/19 pm.   She was seen for GI symptoms and was diagnosed with a 7 cm left ovarian cyst.   She had Covid 19 testing which is still pending at this time.   She will need a follow up ultrasound in the office within a week.

## 2019-05-09 NOTE — Telephone Encounter (Signed)
Patient returned call

## 2019-05-09 NOTE — Telephone Encounter (Signed)
Call to patient. States pain is "the same." No better , no worse. Nausea is improved with medication. Offered office visit in am for recheck. Patient declines stating she needs to go to work. Stressed importance of calling if any change in symptoms. Also seek care at ED if after hours and increased pain.  Follow-up ultrasound scheduled for 05-14-19 at 330 pm.  Routing to Dr Quincy Simmonds. Encounter closed.

## 2019-05-09 NOTE — Telephone Encounter (Signed)
Reviewed with Dr Quincy Simmonds. Need update today on patient status and plan for repeat pelvic ultrasound next week. Call to patient. Left message to call back.

## 2019-05-10 ENCOUNTER — Other Ambulatory Visit: Payer: Self-pay

## 2019-05-10 LAB — CERVICOVAGINAL ANCILLARY ONLY
Chlamydia: NEGATIVE
Neisseria Gonorrhea: NEGATIVE

## 2019-05-14 ENCOUNTER — Other Ambulatory Visit: Payer: Self-pay

## 2019-05-14 ENCOUNTER — Other Ambulatory Visit: Payer: Self-pay | Admitting: Obstetrics and Gynecology

## 2019-05-14 ENCOUNTER — Ambulatory Visit (INDEPENDENT_AMBULATORY_CARE_PROVIDER_SITE_OTHER): Payer: No Typology Code available for payment source

## 2019-05-14 ENCOUNTER — Encounter: Payer: Self-pay | Admitting: Obstetrics and Gynecology

## 2019-05-14 ENCOUNTER — Ambulatory Visit: Payer: No Typology Code available for payment source | Admitting: Obstetrics and Gynecology

## 2019-05-14 VITALS — BP 120/82 | HR 68 | Temp 98.1°F | Ht 59.75 in

## 2019-05-14 DIAGNOSIS — N83202 Unspecified ovarian cyst, left side: Secondary | ICD-10-CM

## 2019-05-14 DIAGNOSIS — B9689 Other specified bacterial agents as the cause of diseases classified elsewhere: Secondary | ICD-10-CM

## 2019-05-14 DIAGNOSIS — N83201 Unspecified ovarian cyst, right side: Secondary | ICD-10-CM

## 2019-05-14 DIAGNOSIS — N760 Acute vaginitis: Secondary | ICD-10-CM

## 2019-05-14 NOTE — Patient Instructions (Signed)
Bacterial Vaginosis  Bacterial vaginosis is a vaginal infection that occurs when the normal balance of bacteria in the vagina is disrupted. It results from an overgrowth of certain bacteria. This is the most common vaginal infection among women ages 15-44. Because bacterial vaginosis increases your risk for STIs (sexually transmitted infections), getting treated can help reduce your risk for chlamydia, gonorrhea, herpes, and HIV (human immunodeficiency virus). Treatment is also important for preventing complications in pregnant women, because this condition can cause an early (premature) delivery. What are the causes? This condition is caused by an increase in harmful bacteria that are normally present in small amounts in the vagina. However, the reason that the condition develops is not fully understood. What increases the risk? The following factors may make you more likely to develop this condition:  Having a new sexual partner or multiple sexual partners.  Having unprotected sex.  Douching.  Having an intrauterine device (IUD).  Smoking.  Drug and alcohol abuse.  Taking certain antibiotic medicines.  Being pregnant. You cannot get bacterial vaginosis from toilet seats, bedding, swimming pools, or contact with objects around you. What are the signs or symptoms? Symptoms of this condition include:  Grey or white vaginal discharge. The discharge can also be watery or foamy.  A fish-like odor with discharge, especially after sexual intercourse or during menstruation.  Itching in and around the vagina.  Burning or pain with urination. Some women with bacterial vaginosis have no signs or symptoms. How is this diagnosed? This condition is diagnosed based on:  Your medical history.  A physical exam of the vagina.  Testing a sample of vaginal fluid under a microscope to look for a large amount of bad bacteria or abnormal cells. Your health care provider may use a cotton swab or  a small wooden spatula to collect the sample. How is this treated? This condition is treated with antibiotics. These may be given as a pill, a vaginal cream, or a medicine that is put into the vagina (suppository). If the condition comes back after treatment, a second round of antibiotics may be needed. Follow these instructions at home: Medicines  Take over-the-counter and prescription medicines only as told by your health care provider.  Take or use your antibiotic as told by your health care provider. Do not stop taking or using the antibiotic even if you start to feel better. General instructions  If you have a female sexual partner, tell her that you have a vaginal infection. She should see her health care provider and be treated if she has symptoms. If you have a female sexual partner, he does not need treatment.  During treatment: ? Avoid sexual activity until you finish treatment. ? Do not douche. ? Avoid alcohol as directed by your health care provider. ? Avoid breastfeeding as directed by your health care provider.  Drink enough water and fluids to keep your urine clear or pale yellow.  Keep the area around your vagina and rectum clean. ? Wash the area daily with warm water. ? Wipe yourself from front to back after using the toilet.  Keep all follow-up visits as told by your health care provider. This is important. How is this prevented?  Do not douche.  Wash the outside of your vagina with warm water only.  Use protection when having sex. This includes latex condoms and dental dams.  Limit how many sexual partners you have. To help prevent bacterial vaginosis, it is best to have sex with just one partner (  monogamous).  Make sure you and your sexual partner are tested for STIs.  Wear cotton or cotton-lined underwear.  Avoid wearing tight pants and pantyhose, especially during summer.  Limit the amount of alcohol that you drink.  Do not use any products that contain  nicotine or tobacco, such as cigarettes and e-cigarettes. If you need help quitting, ask your health care provider.  Do not use illegal drugs. Where to find more information  Centers for Disease Control and Prevention: AppraiserFraud.fi  American Sexual Health Association (ASHA): www.ashastd.org  U.S. Department of Health and Financial controller, Office on Women's Health: DustingSprays.pl or SecuritiesCard.it Contact a health care provider if:  Your symptoms do not improve, even after treatment.  You have more discharge or pain when urinating.  You have a fever.  You have pain in your abdomen.  You have pain during sex.  You have vaginal bleeding between periods. Summary  Bacterial vaginosis is a vaginal infection that occurs when the normal balance of bacteria in the vagina is disrupted.  Because bacterial vaginosis increases your risk for STIs (sexually transmitted infections), getting treated can help reduce your risk for chlamydia, gonorrhea, herpes, and HIV (human immunodeficiency virus). Treatment is also important for preventing complications in pregnant women, because the condition can cause an early (premature) delivery.  This condition is treated with antibiotic medicines. These may be given as a pill, a vaginal cream, or a medicine that is put into the vagina (suppository). This information is not intended to replace advice given to you by your health care provider. Make sure you discuss any questions you have with your health care provider. Document Released: 08/01/2005 Document Revised: 07/14/2017 Document Reviewed: 04/16/2016 Elsevier Patient Education  2020 [employer]. Ovarian Cyst     An ovarian cyst is a fluid-filled sac that forms on an ovary. The ovaries are small organs that produce eggs in women. Various types of cysts can form on the ovaries. Some may cause symptoms and require treatment. Most ovarian cysts go away  on their own, are not cancerous (are benign), and do not cause problems. Common types of ovarian cysts include:  Functional (follicle) cysts. ? Occur during the menstrual cycle, and usually go away with the next menstrual cycle if you do not get pregnant. ? Usually cause no symptoms.  Endometriomas. ? Are cysts that form from the tissue that lines the uterus (endometrium). ? Are sometimes called "chocolate cysts" because they become filled with blood that turns brown. ? Can cause pain in the lower abdomen during intercourse and during your period.  Cystadenoma cysts. ? Develop from cells on the outside surface of the ovary. ? Can get very large and cause lower abdomen pain and pain with intercourse. ? Can cause severe pain if they twist or break open (rupture).  Dermoid cysts. ? Are sometimes found in both ovaries. ? May contain different kinds of body tissue, such as skin, teeth, hair, or cartilage. ? Usually do not cause symptoms unless they get very big.  Theca lutein cysts. ? Occur when too much of a certain hormone (human chorionic gonadotropin) is produced and overstimulates the ovaries to produce an egg. ? Are most common after having procedures used to assist with the conception of a baby (in vitro fertilization). What are the causes? Ovarian cysts may be caused by:  Ovarian hyperstimulation syndrome. This is a condition that can develop from taking fertility medicines. It causes multiple large ovarian cysts to form.  Polycystic ovarian syndrome (PCOS). This is  a common hormonal disorder that can cause ovarian cysts, as well as problems with your period or fertility. What increases the risk? The following factors may make you more likely to develop ovarian cysts:  Being overweight or obese.  Taking fertility medicines.  Taking certain forms of hormonal birth control.  Smoking. What are the signs or symptoms? Many ovarian cysts do not cause symptoms. If symptoms are  present, they may include:  Pelvic pain or pressure.  Pain in the lower abdomen.  Pain during sex.  Abdominal swelling.  Abnormal menstrual periods.  Increasing pain with menstrual periods. How is this diagnosed? These cysts are commonly found during a routine pelvic exam. You may have tests to find out more about the cyst, such as:  Ultrasound.  X-ray of the pelvis.  CT scan.  MRI.  Blood tests. How is this treated? Many ovarian cysts go away on their own without treatment. Your health care provider may want to check your cyst regularly for 2-3 months to see if it changes. If you are in menopause, it is especially important to have your cyst monitored closely because menopausal women have a higher rate of ovarian cancer. When treatment is needed, it may include:  Medicines to help relieve pain.  A procedure to drain the cyst (aspiration).  Surgery to remove the whole cyst.  Hormone treatment or birth control pills. These methods are sometimes used to help dissolve a cyst. Follow these instructions at home:  Take over-the-counter and prescription medicines only as told by your health care provider.  Do not drive or use heavy machinery while taking prescription pain medicine.  Get regular pelvic exams and Pap tests as often as told by your health care provider.  Return to your normal activities as told by your health care provider. Ask your health care provider what activities are safe for you.  Do not use any products that contain nicotine or tobacco, such as cigarettes and e-cigarettes. If you need help quitting, ask your health care provider.  Keep all follow-up visits as told by your health care provider. This is important. Contact a health care provider if:  Your periods are late, irregular, or painful, or they stop.  You have pelvic pain that does not go away.  You have pressure on your bladder or trouble emptying your bladder completely.  You have pain  during sex.  You have any of the following in your abdomen: ? A feeling of fullness. ? Pressure. ? Discomfort. ? Pain that does not go away. ? Swelling.  You feel generally ill.  You become constipated.  You lose your appetite.  You develop severe acne.  You start to have more body hair and facial hair.  You are gaining weight or losing weight without changing your exercise and eating habits.  You think you may be pregnant. Get help right away if:  You have abdominal pain that is severe or gets worse.  You cannot eat or drink without vomiting.  You suddenly develop a fever.  Your menstrual period is much heavier than usual. This information is not intended to replace advice given to you by your health care provider. Make sure you discuss any questions you have with your health care provider. Document Released: 08/01/2005 Document Revised: 10/30/2017 Document Reviewed: 01/03/2016 Elsevier Patient Education  2020 [employer].

## 2019-05-14 NOTE — Progress Notes (Signed)
GYNECOLOGY  VISIT   HPI: 48 y.o.   Married  Caucasian  female   G3P3000 with Patient's last menstrual period was 09/26/2015 (exact date).   here for pelvic ultrasound.  Husband present for the visit today.  Patient was seen in the ER at [facility] on 05/08/19 for GI symptoms, abdominal pain and finding of a left ovarian cyst 7.3 x 5.8 x. 7.1 cm.  Difficult to do doppler studies due to the lack of ovarian tissue.   She did not have an acute abdomen, so outpatient follow up was planned.  She tested negative for Covid and had a wet prep showing BV. GC/CT were both negative.   She does have a dx of C Diff treated this spring and summer.   Today she has localized left lower quadrant pain, 5/10.  Able to eat and work.  Not sleeping due to her thyroid issues per patient.  No pain medication use.   Used OCPs in the past.   Does smoke periodically.   GYNECOLOGIC HISTORY: Patient's last menstrual period was 09/26/2015 (exact date). Contraception:  Hysterectomy Menopausal hormone therapy: None Last mammogram: 04-01-19 Diag.Bil w/US Neg/density C/biRads2 Last pap smear:  08/20/2015 normal.  Final pathology of cervix at hysterectomy - benign.         OB History    Gravida  3   Para  3   Term  3   Preterm      AB      Living        SAB      TAB      Ectopic      Multiple      Live Births                 Patient Active Problem List   Diagnosis Date Noted  . Vitamin D deficiency 12/14/2017  . 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 Genetic testing 06/20/2016  . Family history of breast cancer in female 05/12/2016  . Family history of colorectal cancer 05/12/2016  . Fatty liver 02/22/2016  . Abnormal mammogram of left breast 01/22/2016  . Breast cyst 11/21/2014  . Menorrhagia 11/21/2014  . Hypothyroidism 11/21/2014  . Obesity 11/21/2014  . Seasonal allergies 11/21/2014  . Asthma, chronic 11/21/2014  . Generalized anxiety disorder 11/21/2014    Past Medical History:   Diagnosis Date  . Abnormal mammogram of left breast 01/22/2016  . Anemia 11/21/2014   -with elevated platelets   . Anxiety   . Asthma   . Atypical nevus 10/10/2016   Left Inner Thigh - Mild  . Breast cyst 11/21/2014   -L breast, upper L just off from center -stable per radiology 10/2014   . Constipation   . Dyspnea   . Fatty liver   . Gallbladder problem   . Gallstone pancreatitis 03/14/2016  . Generalized anxiety disorder 11/21/2014  . GERD (gastroesophageal reflux disease)   . Hypothyroidism   . IBS (irritable bowel syndrome)   . Menorrhagia 11/21/2014   -seeing gyn    . Obesity 11/21/2014  . Ovarian cyst    Bilateral  . Plantar fasciitis of left foot 11/21/2014  . Seasonal allergies 11/21/2014  . 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 Thyroid disease     Past Surgical History:  Procedure Laterality Date  . ABDOMINAL HYSTERECTOMY    . BREAST BIOPSY    . BREAST EXCISIONAL BIOPSY    . BREAST LUMPECTOMY WITH RADIOACTIVE SEED LOCALIZATION Left 01/22/2016  Procedure: LEFT BREAST LUMPECTOMY WITH RADIOACTIVE SEED LOCALIZATION;  Surgeon: Fanny Skates, MD;  Location: [facility];  Service: General;  Laterality: Left;  . CESAREAN SECTION     x3  . CHOLECYSTECTOMY N/A 03/15/2016   Procedure: LAPAROSCOPIC CHOLECYSTECTOMY WITH INTRAOPERATIVE CHOLANGIOGRAM;  Surgeon: Jackolyn Confer, MD;  Location: [facility];  Service: General;  Laterality: N/A;  . CYSTO N/A 10/06/2015   Procedure: Consuela Mimes;  Surgeon: Nunzio Cobbs, MD;  Location: [facility];  Service: Gynecology;  Laterality: N/A;  . TUBAL LIGATION      Current Outpatient Medications  Medication Sig Dispense Refill  . albuterol (PROVENTIL) (2.5 MG/3ML) 0.083% nebulizer solution Take 3 mLs (2.5 mg total) by nebulization every 6 (six) hours as needed for wheezing or shortness of breath. 75 mL 0  . albuterol (VENTOLIN HFA) 108 (90 Base) MCG/ACT inhaler INHALE 1 PUFF INTO THE LUNGS  EVERY 6 (SIX) HOURS AS NEEDED FOR WHEEZING OR SHORTNESS OF BREATH. (Patient taking differently: Inhale 1 puff into the lungs every 6 (six) hours as needed for wheezing or shortness of breath. ) 18 g 0  . levothyroxine (SYNTHROID) 88 MCG tablet TAKE 1 TABLET BY MOUTH DAILY. (Patient taking differently: Take 88 mcg by mouth daily. ) 90 tablet 1  . liothyronine (CYTOMEL) 5 MCG tablet TAKE 2 TABLETS BY MOUTH EVERY MORNING WITH LEVOTHYROXINE (Patient taking differently: Take 10 mcg by mouth daily. ) 180 tablet 2  . loratadine (CLARITIN) 10 MG tablet Take 1 tablet (10 mg total) by mouth daily. 90 tablet 0  . venlafaxine XR (EFFEXOR-XR) 150 MG 24 hr capsule TAKE 1 CAPSULE BY MOUTH DAILY WITH BREAKFAST **MUST MAKE AN APPOINTMENT** (Patient taking differently: Take 150 mg by mouth daily with breakfast. ) 30 capsule 0  . polyethylene glycol-electrolytes (NULYTELY/GOLYTELY) 420 g solution Take 4,000 mLs by mouth as directed. 4000 mL 0   No current facility-administered medications for this visit.      ALLERGIES: Patient has no known allergies.  Family History  Problem Relation Age of Onset  . Breast cancer Mother 75       s/p lump and rad  . Skin cancer Mother        non-melanoma  . Thyroid disease Mother   . Skin cancer Father        non-melanoma  . Hypertension Father   . Hyperlipidemia Father   . Breast cancer Paternal Grandmother        dx 67s; s/p mastectomy  . Cervical cancer Maternal Grandmother   . Arthritis Maternal Grandmother   . CVA Maternal Grandmother   . Stroke Maternal Grandmother 80  . Heart attack Maternal Grandfather        d. 70s  . Colon cancer Paternal Grandfather 65  . Rectal cancer Maternal Uncle 54  . Aortic aneurysm Maternal Uncle 53  . Uterine cancer Other        maternal great grandmother (MGM's mother)  . Stroke Other   . Cancer Other        maternal great grandfather (MGM's father); NOS cancer  . Breast cancer Other        maternal great grandmother (MGF's  mother)  . Colon cancer Other        maternal great grandfather (MGF's father)  . Heart attack Other   . Colon cancer Other        paternal great grandfather (PGF's father)  . Heart attack Other   . Stomach cancer Neg Hx   . Pancreatic  cancer Neg Hx     Social History   Socioeconomic History  . Marital status: Married    Spouse name: Elberta Fortis Liddell  . Number of children: 3  . Years of education: Not on file  . Highest education level: Not on file  Occupational History  . Occupation: Government social research officer  Social Needs  . Financial resource strain: Not on file  . Food insecurity    Worry: Not on file    Inability: Not on file  . Transportation needs    Medical: Not on file    Non-medical: Not on file  Tobacco Use  . Smoking status: Former Smoker    Types: Cigarettes  . Smokeless tobacco: Never Used  Substance and Sexual Activity  . Alcohol use: Yes    Alcohol/week: 1.0 standard drinks    Types: 1 Glasses of wine per week    Comment: socially  . Drug use: No  . Sexual activity: Yes    Partners: Male    Birth control/protection: Surgical    Comment: Tubal  Lifestyle  . Physical activity    Days per week: Not on file    Minutes per session: Not on file  . Stress: Not on file  Relationships  . Social Herbalist on phone: Not on file    Gets together: Not on file    Attends religious service: Not on file    Active member of club or organization: Not on file    Attends meetings of clubs or organizations: Not on file    Relationship status: Not on file  . Intimate partner violence    Fear of current or ex partner: Not on file    Emotionally abused: Not on file    Physically abused: Not on file    Forced sexual activity: Not on file  Other Topics Concern  . Not on file  Social History Narrative  . Not on file    Review of Systems  All other systems reviewed and are negative.   PHYSICAL EXAMINATION:    BP 120/82 (Cuff Size: Large)   Pulse  68   Temp 98.1 F (36.7 C) (Temporal)   Ht 4' 11.75" (1.518 m)   LMP 09/26/2015 (Exact Date)   BMI 41.75 kg/m     General appearance: alert, cooperative and appears stated age.  NAD.    Pelvic: External genitalia:  no lesions              Urethra:  normal appearing urethra with no masses, tenderness or lesions                         Bimanual Exam:  Uterus:   Absent.                Adnexa: fullness in the cul de sac and ill defined mass, mildly tender.               Chaperone was present for exam.  Pelvic US today Absent uterus.  Normal vaginal cuff.  Right ovarian normal size with 1.5 x 1.2 cm avascular nodule - fibroma versus endometrioma.  Left ovarian unilocular cyst 6.2 cm x 6.1 cm x 5.4 cm with no abnormal internal blood flow.  Tender with ultrasound examination.  No free fluid.    ASSESSMENT  Status post TAH/bilateral salpingectomy/LOA. Bilateral ovarian cysts?  Right fibroma versus endometrioma and left large unilocular cyst.  I suspect these are benign in  nature.  Recent BV, untreated.  Smoker.   Recent C Difficile.                    PLAN  We discussed the bilateral adnexal findings and possible etiologies of these cysts.  Will discussed Micronor and repeat pelvic US in 6 weeks versus doing a laparoscopy with possible bilateral oophorectomy.  Will check a CA125 to help guide this decision.  She knows that we can do surgical care at any time if her pain is significant.  Torsion risks given.  Will plan for Flagyl 500 mg po bid x 7 days.     An After Visit Summary was printed and given to the patient.  _25_____ minutes face to face time of which over 50% was spent in counseling.

## 2019-05-15 ENCOUNTER — Telehealth: Payer: Self-pay | Admitting: *Deleted

## 2019-05-15 ENCOUNTER — Ambulatory Visit (INDEPENDENT_AMBULATORY_CARE_PROVIDER_SITE_OTHER): Payer: No Typology Code available for payment source | Admitting: Gastroenterology

## 2019-05-15 ENCOUNTER — Encounter: Payer: Self-pay | Admitting: Gastroenterology

## 2019-05-15 ENCOUNTER — Other Ambulatory Visit (INDEPENDENT_AMBULATORY_CARE_PROVIDER_SITE_OTHER): Payer: No Typology Code available for payment source

## 2019-05-15 ENCOUNTER — Other Ambulatory Visit: Payer: Self-pay | Admitting: *Deleted

## 2019-05-15 VITALS — BP 118/76 | HR 89 | Temp 97.6°F | Ht 59.0 in | Wt 219.0 lb

## 2019-05-15 DIAGNOSIS — N83201 Unspecified ovarian cyst, right side: Secondary | ICD-10-CM

## 2019-05-15 DIAGNOSIS — R1010 Upper abdominal pain, unspecified: Secondary | ICD-10-CM

## 2019-05-15 DIAGNOSIS — R198 Other specified symptoms and signs involving the digestive system and abdomen: Secondary | ICD-10-CM

## 2019-05-15 DIAGNOSIS — N83202 Unspecified ovarian cyst, left side: Secondary | ICD-10-CM

## 2019-05-15 DIAGNOSIS — R152 Fecal urgency: Secondary | ICD-10-CM | POA: Diagnosis not present

## 2019-05-15 DIAGNOSIS — R140 Abdominal distension (gaseous): Secondary | ICD-10-CM

## 2019-05-15 LAB — CA 125: Cancer Antigen (CA) 125: 7 U/mL (ref 0.0–38.1)

## 2019-05-15 LAB — CBC WITH DIFFERENTIAL/PLATELET
Basophils Absolute: 0.1 10*3/uL (ref 0.0–0.1)
Basophils Relative: 0.6 % (ref 0.0–3.0)
Eosinophils Absolute: 0 10*3/uL (ref 0.0–0.7)
Eosinophils Relative: 0.1 % (ref 0.0–5.0)
HCT: 38.9 % (ref 36.0–46.0)
Hemoglobin: 13.3 g/dL (ref 12.0–15.0)
Lymphocytes Relative: 17.5 % (ref 12.0–46.0)
Lymphs Abs: 2 10*3/uL (ref 0.7–4.0)
MCHC: 34.1 g/dL (ref 30.0–36.0)
MCV: 86.5 fl (ref 78.0–100.0)
Monocytes Absolute: 0.8 10*3/uL (ref 0.1–1.0)
Monocytes Relative: 7 % (ref 3.0–12.0)
Neutro Abs: 8.8 10*3/uL — ABNORMAL HIGH (ref 1.4–7.7)
Neutrophils Relative %: 74.8 % (ref 43.0–77.0)
Platelets: 390 10*3/uL (ref 150.0–400.0)
RBC: 4.5 Mil/uL (ref 3.87–5.11)
RDW: 13 % (ref 11.5–15.5)
WBC: 11.7 10*3/uL — ABNORMAL HIGH (ref 4.0–10.5)

## 2019-05-15 LAB — COMPREHENSIVE METABOLIC PANEL
ALT: 20 U/L (ref 0–35)
AST: 16 U/L (ref 0–37)
Albumin: 4.1 g/dL (ref 3.5–5.2)
Alkaline Phosphatase: 65 U/L (ref 39–117)
BUN: 10 mg/dL (ref 6–23)
CO2: 27 mEq/L (ref 19–32)
Calcium: 9.5 mg/dL (ref 8.4–10.5)
Chloride: 104 mEq/L (ref 96–112)
Creatinine, Ser: 0.72 mg/dL (ref 0.40–1.20)
GFR: 86 mL/min (ref >60.00)
Glucose, Bld: 84 mg/dL (ref 70–99)
Potassium: 3.7 mEq/L (ref 3.5–5.1)
Sodium: 139 mEq/L (ref 135–145)
Total Bilirubin: 0.4 mg/dL (ref 0.2–1.2)
Total Protein: 6.9 g/dL (ref 6.0–8.3)

## 2019-05-15 MED ORDER — PEG 3350-KCL-NA BICARB-NACL 420 G PO SOLR: 4000.0000 mL | ORAL | 0 refills | Status: DC

## 2019-05-15 MED ORDER — NORETHINDRONE 0.35 MG PO TABS: 1.0000 | ORAL_TABLET | Freq: Every day | ORAL | 0 refills | Status: DC

## 2019-05-15 MED ORDER — METRONIDAZOLE 500 MG PO TABS: 500.0000 mg | ORAL_TABLET | Freq: Two times a day (BID) | ORAL | 0 refills | Status: DC

## 2019-05-15 NOTE — Telephone Encounter (Signed)
Call to patient. Patient advised of results as seen below from Dr. Quincy Simmonds and patient verbalized understanding. Patient states she would like to try the micronor and follow up with an PUS in 6 weeks. Pharmacy confirmed as [facility] on [address]. Patient scheduled for 6 week PUS on 06-27-2019 at 1000 with 1030 consult with Dr. Quincy Simmonds. Patient agreeable to date and time of appointment. Future order placed for PUS.   Micronor for Dr. Elza Rafter review. Patient also asking about a prescription for BV she had discussed with Dr. Quincy Simmonds. Patient states, "she was going to prescribe something that also treats c-diff." RN advised would need to review with Dr. Quincy Simmonds and return call. Patient agreeable. Patient states she much prefers pills to a cream.

## 2019-05-15 NOTE — Telephone Encounter (Signed)
I did discuss BV noted on her wet prep done at the hospital ER.  She can take Flagyl 500 mg po bid x 7 days.  Abstain from ETOH during tx and for 72 hours after the last dose as this combination may cause significant nausea and vomiting.

## 2019-05-15 NOTE — Patient Instructions (Signed)
You have been scheduled for an endoscopy and colonoscopy. Please follow the written instructions given to you at your visit today. Please pick up your prep supplies at the pharmacy within the next 1-3 days. If you use inhalers (even only as needed), please bring them with you on the day of your procedure. Your physician has requested that you go to www.startemmi.com and enter the access code given to you at your visit today. This web site gives a general overview about your procedure. However, you should still follow specific instructions given to you by our office regarding your preparation for the procedure.  Your provider has requested that you go to the basement level for lab work before leaving today. Press "B" on the elevator. The lab is located at the first door on the left as you exit the elevator.  Thank you for entrusting me with your care and choosing [facility].  Dr Ardis Hughs

## 2019-05-15 NOTE — Telephone Encounter (Signed)
Call to patient. Message given to patient as seen below from Dr. Quincy Simmonds and she verbalized understanding. ETOH precautions reviewed with patient.   Rx for micronor #3, 0RF and flagyl #14, 0RF sent to confirmed pharmacy.   Routing to provider and will close encounter.

## 2019-05-15 NOTE — Progress Notes (Signed)
Review of pertinent gastrointestinal problems: 1.  Acute diarrhea, found to be C. Diff + by toxin GI pathogen panel May 2020.  She did not respond to oral vancomycin 125 mg 4 times daily for 2-week course.  Trial of fidaxomicin 200 mg twice daily for 10 days May 2020.    HPI: This is a very pleasant 48 year old woman whom I last communicated with via telemedicine visit several months ago.  At that time I diagnosed her with C. difficile related diarrhea.  She did not really respond to vancomycin and so I put her on a deficit which she did respond to.  Fortunately she still having bowel related issues.  She is not really having diarrhea but she has fecal urgency with just about every oral intake.  She will have urgency and have to run to the bathroom 3-5 times a day.  This is definitely not normal for her.  She has not seen any overt GI bleeding.  She has also been bothered by significant bloating after meals.  Her weight is up overall 10 pounds in the past year.  She went to the emergency room 2 weeks ago for significant right upper quadrant abdominal pains.  She causes a dull ache.  They have been going on for 2 or 3 weeks.  They are somewhat similar location to her gallbladder pain from her 2017 lap chole.  She does not have associated nausea or vomiting with these.  Labs September 2020 COVID test negative, CBC normal except for white blood cell count of 10.9, complete metabolic profile normal except for potassium 3.4, lipase normal.  CT scan abdomen pelvis with IV and oral contrast showed a large cyst in her left ovary, 7 x 5 cm.  Also nonobstructing left nephrolithiasis  Follow-up pelvic ultrasound September 2020 showed "mildly complicated cyst predominantly replacing the left ovary, surgical absence of uterus,  She saw gynecologist yesterday, I can honestly not tell from his office note whether he thought the large ovarian cyst was causing some of her abdominal pains.  She was however  diagnosed with bacterial vaginosis.  Chief complaint is bloating, fecal urgency, right upper quadrant abdominal pain  ROS: complete GI ROS as described in HPI, all other review negative.  Constitutional:  No unintentional weight loss   Past Medical History:  Diagnosis Date  . Abnormal mammogram of left breast 01/22/2016  . Anemia 11/21/2014   -with elevated platelets   . Anxiety   . Asthma   . Atypical nevus 10/10/2016   Left Inner Thigh - Mild  . Breast cyst 11/21/2014   -L breast, upper L just off from center -stable per radiology 10/2014   . Constipation   . Dyspnea   . Fatty liver   . Gallbladder problem   . Gallstone pancreatitis 03/14/2016  . Generalized anxiety disorder 11/21/2014  . GERD (gastroesophageal reflux disease)   . Hypothyroidism   . IBS (irritable bowel syndrome)   . Menorrhagia 11/21/2014   -seeing gyn    . Obesity 11/21/2014  . Ovarian cyst    Bilateral  . Plantar fasciitis of left foot 11/21/2014  . Seasonal allergies 11/21/2014  . 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 Thyroid disease     Past Surgical History:  Procedure Laterality Date  . ABDOMINAL HYSTERECTOMY    . BREAST BIOPSY    . BREAST EXCISIONAL BIOPSY    . BREAST LUMPECTOMY WITH RADIOACTIVE SEED LOCALIZATION Left 01/22/2016   Procedure: LEFT BREAST LUMPECTOMY WITH  RADIOACTIVE SEED LOCALIZATION;  Surgeon: Fanny Skates, MD;  Location: [facility];  Service: General;  Laterality: Left;  . CESAREAN SECTION     x3  . CHOLECYSTECTOMY N/A 03/15/2016   Procedure: LAPAROSCOPIC CHOLECYSTECTOMY WITH INTRAOPERATIVE CHOLANGIOGRAM;  Surgeon: Jackolyn Confer, MD;  Location: [facility];  Service: General;  Laterality: N/A;  . CYSTO N/A 10/06/2015   Procedure: Consuela Mimes;  Surgeon: Nunzio Cobbs, MD;  Location: [facility];  Service: Gynecology;  Laterality: N/A;  . TUBAL LIGATION      Current Outpatient Medications  Medication Sig Dispense Refill   . albuterol (PROVENTIL) (2.5 MG/3ML) 0.083% nebulizer solution Take 3 mLs (2.5 mg total) by nebulization every 6 (six) hours as needed for wheezing or shortness of breath. 75 mL 0  . albuterol (VENTOLIN HFA) 108 (90 Base) MCG/ACT inhaler INHALE 1 PUFF INTO THE LUNGS EVERY 6 (SIX) HOURS AS NEEDED FOR WHEEZING OR SHORTNESS OF BREATH. (Patient taking differently: Inhale 1 puff into the lungs every 6 (six) hours as needed for wheezing or shortness of breath. ) 18 g 0  . levothyroxine (SYNTHROID) 88 MCG tablet TAKE 1 TABLET BY MOUTH DAILY. (Patient taking differently: Take 88 mcg by mouth daily. ) 90 tablet 1  . liothyronine (CYTOMEL) 5 MCG tablet TAKE 2 TABLETS BY MOUTH EVERY MORNING WITH LEVOTHYROXINE (Patient taking differently: Take 10 mcg by mouth daily. ) 180 tablet 2  . loratadine (CLARITIN) 10 MG tablet Take 1 tablet (10 mg total) by mouth daily. 90 tablet 0  . venlafaxine XR (EFFEXOR-XR) 150 MG 24 hr capsule TAKE 1 CAPSULE BY MOUTH DAILY WITH BREAKFAST **MUST MAKE AN APPOINTMENT** (Patient taking differently: Take 150 mg by mouth daily with breakfast. ) 30 capsule 0   No current facility-administered medications for this visit.     Allergies as of 05/15/2019  . (No Known Allergies)    Family History  Problem Relation Age of Onset  . Breast cancer Mother 67       s/p lump and rad  . Skin cancer Mother        non-melanoma  . Thyroid disease Mother   . Skin cancer Father        non-melanoma  . Hypertension Father   . Hyperlipidemia Father   . Breast cancer Paternal Grandmother        dx 85s; s/p mastectomy  . Cervical cancer Maternal Grandmother   . Arthritis Maternal Grandmother   . CVA Maternal Grandmother   . Stroke Maternal Grandmother 80  . Heart attack Maternal Grandfather        d. 72s  . Colon cancer Paternal Grandfather 26  . Rectal cancer Maternal Uncle 54  . Aortic aneurysm Maternal Uncle 53  . Uterine cancer Other        maternal great grandmother (MGM's mother)   . Stroke Other   . Cancer Other        maternal great grandfather (MGM's father); NOS cancer  . Breast cancer Other        maternal great grandmother (MGF's mother)  . Colon cancer Other        maternal great grandfather (MGF's father)  . Heart attack Other   . Colon cancer Other        paternal great grandfather (PGF's father)  . Heart attack Other   . Stomach cancer Neg Hx   . Pancreatic cancer Neg Hx     Social History   Socioeconomic History  . Marital status: Married  Spouse name: Elberta Fortis Cerreta  . Number of children: 3  . Years of education: Not on file  . Highest education level: Not on file  Occupational History  . Occupation: Government social research officer  Social Needs  . Financial resource strain: Not on file  . Food insecurity    Worry: Not on file    Inability: Not on file  . Transportation needs    Medical: Not on file    Non-medical: Not on file  Tobacco Use  . Smoking status: Former Smoker    Types: Cigarettes  . Smokeless tobacco: Never Used  Substance and Sexual Activity  . Alcohol use: Yes    Alcohol/week: 1.0 standard drinks    Types: 1 Glasses of wine per week    Comment: socially  . Drug use: No  . Sexual activity: Yes    Partners: Male    Birth control/protection: Surgical    Comment: Tubal  Lifestyle  . Physical activity    Days per week: Not on file    Minutes per session: Not on file  . Stress: Not on file  Relationships  . Social Herbalist on phone: Not on file    Gets together: Not on file    Attends religious service: Not on file    Active member of club or organization: Not on file    Attends meetings of clubs or organizations: Not on file    Relationship status: Not on file  . Intimate partner violence    Fear of current or ex partner: Not on file    Emotionally abused: Not on file    Physically abused: Not on file    Forced sexual activity: Not on file  Other Topics Concern  . Not on file  Social History  Narrative  . Not on file     Physical Exam: BP 118/76   Pulse 89   Temp 97.6 F (36.4 C)   Ht 4\' 11"  (1.499 m)   Wt 219 lb (99.3 kg)   LMP 09/26/2015 (Exact Date)   BMI 44.23 kg/m  Constitutional: generally well-appearing Psychiatric: alert and oriented x3 Abdomen: soft, nontender, nondistended, no obvious ascites, no peritoneal signs, normal bowel sounds No peripheral edema noted in lower extremities  Assessment and plan: 48 y.o. female with bloating, fecal urgency, right upper quadrant abdominal pain  I am not sure if 1 diagnosis will explain all of these symptoms.  She is certainly bothered by fecal urgency and bloating after every meal.  She is having to go to the bathroom for 5 times daily, stools are usually solid rather than diarrheal.  I recommended a colonoscopy and upper endoscopy at the same time.  I think it is unlikely anything serious given the CAT scan, recent normal labs and the fact that she has gained 10 pounds in the past 6 months.  She will get a basic set of labs including a CBC and complete metabolic profile.  Right upper quadrant pain reminds her of her gallbladder, gallstone related discomforts.  Possibly she has retained bile duct stone.  She understands that she might need more testing following the endoscopic procedures and labs above such as that MRI with MRCP.  Please see the "Patient Instructions" section for addition details about the plan.  Owens Loffler, MD [facility] 05/15/2019, 1:54 PM

## 2019-05-15 NOTE — Telephone Encounter (Signed)
-----   Message from Nunzio Cobbs, MD sent at 05/15/2019  1:08 PM EDT ----- Please contact patient and let her know that the CA125 test is completely normal. I have no increased suspicion for endometriosis or for any cancer.   We discussed options for care of her ovarian cysts yesterday including Micronor Rx and then a follow up pelvic ultrasound in 6 weeks versus surgical care.   If she is open to trying the Micronor, she can start right away.  She would then also need an order placed for follow up ultrasound in 6 weeks.

## 2019-05-17 ENCOUNTER — Other Ambulatory Visit: Payer: Self-pay | Admitting: Family Medicine

## 2019-05-20 ENCOUNTER — Ambulatory Visit: Payer: No Typology Code available for payment source | Admitting: Obstetrics and Gynecology

## 2019-05-21 ENCOUNTER — Encounter: Payer: Self-pay | Admitting: Family Medicine

## 2019-05-21 MED ORDER — ALBUTEROL SULFATE HFA 108 (90 BASE) MCG/ACT IN AERS: INHALATION_SPRAY | RESPIRATORY_TRACT | 0 refills | Status: DC

## 2019-05-21 NOTE — Progress Notes (Signed)
Please let her know, as she already know, Dr. Maudie Mercury is no longer doing primary care at [facility]. Would she like to continue her care here? If so, needs appt with Dr. Ethlyn Gallery to meet her - and then can do virtual visits with Dr. Maudie Mercury on Tuesdays and thursdays if ever wishes to. Or, can establish with one of our other full time providers.  Thanks.

## 2019-06-14 ENCOUNTER — Telehealth: Payer: Self-pay | Admitting: Obstetrics and Gynecology

## 2019-06-14 ENCOUNTER — Telehealth: Payer: Self-pay | Admitting: Gastroenterology

## 2019-06-14 NOTE — Telephone Encounter (Signed)
I spoke with Barbera Setters RN and she advised that the pt was scheduled prior to the implementation of COVID testing so she is not required to test.  She can if she chooses.  The pt opted not to test and will have the procedure as scheduled.

## 2019-06-14 NOTE — Telephone Encounter (Signed)
°  Patient calling to ask if she is supposed to continue to take Incassia prescription until her follow up ultrasound. Patient has enough pills through Sunday. Pharmacy states she can only get a 3 month supply. Please advise.   [facility] [address]

## 2019-06-14 NOTE — Telephone Encounter (Signed)
Micronor #[id number] [facility] to [facility].   Spoke with patient. Patient is requesting additional refills of micronor until PUS on 11/12. States she only received a 30 day supply from the pharmacy. Advised patient she should have 2 refills on file, f/u with pharmacy for Rx. Return call to office to provide update. Patient agreeable.

## 2019-06-14 NOTE — Telephone Encounter (Signed)
Hi Maria [name] and Maria [name] this pt was scheduled back in September before we were testing for COVID.  It is our understanding that we are not to go back and place orders or add these patients on the COVID schedule.  Maria [name] is in a Web ex right now.  Can you help me ?  The pt is aware we are trying to get an answer for her.

## 2019-06-17 ENCOUNTER — Other Ambulatory Visit: Payer: Self-pay | Admitting: Family Medicine

## 2019-06-17 NOTE — Telephone Encounter (Signed)
Spoke with patient. Patient was able to get her RX from pharmacy. No additional assistance needed.   Routing to provider for final review. Patient is agreeable to disposition. Will close encounter.

## 2019-06-18 ENCOUNTER — Ambulatory Visit (AMBULATORY_SURGERY_CENTER): Payer: No Typology Code available for payment source | Admitting: Gastroenterology

## 2019-06-18 ENCOUNTER — Other Ambulatory Visit: Payer: Self-pay

## 2019-06-18 ENCOUNTER — Encounter: Payer: Self-pay | Admitting: Gastroenterology

## 2019-06-18 ENCOUNTER — Telehealth: Payer: Self-pay | Admitting: Obstetrics and Gynecology

## 2019-06-18 VITALS — BP 142/79 | HR 88 | Temp 98.2°F | Resp 14 | Ht 59.0 in | Wt 219.0 lb

## 2019-06-18 DIAGNOSIS — K222 Esophageal obstruction: Secondary | ICD-10-CM

## 2019-06-18 DIAGNOSIS — R194 Change in bowel habit: Secondary | ICD-10-CM

## 2019-06-18 DIAGNOSIS — K2950 Unspecified chronic gastritis without bleeding: Secondary | ICD-10-CM

## 2019-06-18 DIAGNOSIS — K621 Rectal polyp: Secondary | ICD-10-CM

## 2019-06-18 DIAGNOSIS — R140 Abdominal distension (gaseous): Secondary | ICD-10-CM

## 2019-06-18 DIAGNOSIS — K635 Polyp of colon: Secondary | ICD-10-CM | POA: Diagnosis not present

## 2019-06-18 DIAGNOSIS — K449 Diaphragmatic hernia without obstruction or gangrene: Secondary | ICD-10-CM

## 2019-06-18 DIAGNOSIS — D123 Benign neoplasm of transverse colon: Secondary | ICD-10-CM

## 2019-06-18 DIAGNOSIS — K2951 Unspecified chronic gastritis with bleeding: Secondary | ICD-10-CM

## 2019-06-18 DIAGNOSIS — D125 Benign neoplasm of sigmoid colon: Secondary | ICD-10-CM

## 2019-06-18 DIAGNOSIS — D127 Benign neoplasm of rectosigmoid junction: Secondary | ICD-10-CM

## 2019-06-18 DIAGNOSIS — R197 Diarrhea, unspecified: Secondary | ICD-10-CM

## 2019-06-18 DIAGNOSIS — K2970 Gastritis, unspecified, without bleeding: Secondary | ICD-10-CM

## 2019-06-18 HISTORY — PX: COLONOSCOPY: SHX174

## 2019-06-18 HISTORY — PX: UPPER GASTROINTESTINAL ENDOSCOPY: SHX188

## 2019-06-18 HISTORY — PX: COLONOSCOPY WITH ESOPHAGOGASTRODUODENOSCOPY (EGD): SHX5779

## 2019-06-18 MED ORDER — SODIUM CHLORIDE 0.9 % IV SOLN: 500.0000 mL | INTRAVENOUS | Status: DC

## 2019-06-18 MED ORDER — VENLAFAXINE HCL ER 150 MG PO CP24: ORAL_CAPSULE | ORAL | 0 refills | Status: DC

## 2019-06-18 NOTE — Telephone Encounter (Signed)
Call placed to patient to review benefit for scheduled ultrasound. Left voicemail message requesting a return call.

## 2019-06-18 NOTE — Op Note (Signed)
[facility] Patient Name: Maria [name] Procedure Date: 06/18/2019 12:46 PM MRN: [id number] Endoscopist: Milus Banister , MD Age: 48 Referring MD:  Date of Birth: 13-Sep-1970 Gender: Female Account #: [id number] Procedure:                Upper GI endoscopy Indications:              Abdominal pain in the right upper quadrant, chronic                            loose stools and urgency Medicines:                Monitored Anesthesia Care Procedure:                Pre-Anesthesia Assessment:                           - Prior to the procedure, a History and Physical                            was performed, and patient medications and                            allergies were reviewed. The patient's tolerance of                            previous anesthesia was also reviewed. The risks                            and benefits of the procedure and the sedation                            options and risks were discussed with the patient.                            All questions were answered, and informed consent                            was obtained. Prior Anticoagulants: The patient has                            taken no previous anticoagulant or antiplatelet                            agents. ASA Grade Assessment: III - A patient with                            severe systemic disease. After reviewing the risks                            and benefits, the patient was deemed in                            satisfactory condition to undergo the procedure.  After obtaining informed consent, the endoscope was                            passed under direct vision. Throughout the                            procedure, the patient's blood pressure, pulse, and                            oxygen saturations were monitored continuously. The                            Endoscope was introduced through the mouth, and                            advanced to the second  part of duodenum. The upper                            GI endoscopy was accomplished without difficulty.                            The patient tolerated the procedure well. Scope In: Scope Out: Findings:                 Normal appearing duodenum mucosal, biopsied to                            check for Celiac Sprue.                           Mild inflammation characterized by erythema was                            found in the gastric antrum. Biopsies were taken                            with a cold forceps for histology.                           Small hiatal hernia.                           Partial, non-obstructive mucosal ring at the GE                            junction.                           The exam was otherwise without abnormality. Complications:            No immediate complications. Estimated blood loss:                            None. Estimated Blood Loss:     Estimated blood loss: none. Impression:               - Gastritis. Biopsied to  check for H. pylori.                           - Normal duodenum, biopsied to check for Celiac                            Sprue.                           - Small hiatal hernia.                           - Partial, non-obstructive mucosal ring at the GE                            junction.                           - The examination was otherwise normal. Recommendation:           - Patient has a contact number available for                            emergencies. The signs and symptoms of potential                            delayed complications were discussed with the                            patient. Return to normal activities tomorrow.                            Written discharge instructions were provided to the                            patient.                           - Resume previous diet.                           - Continue present medications.                           - Await pathology results from this exam as  well as                            the pathology from your colonoscopy. Milus Banister, MD 06/18/2019 2:11:35 PM This report has been signed electronically.

## 2019-06-18 NOTE — Progress Notes (Signed)
Called to room to assist during endoscopic procedure.  Patient ID and intended procedure confirmed with present staff. Received instructions for my participation in the procedure from the performing physician.  

## 2019-06-18 NOTE — Op Note (Signed)
[facility] Patient Name: Maria [name] Procedure Date: 06/18/2019 12:47 PM MRN: [id number] Endoscopist: Milus Banister , MD Age: 48 Referring MD:  Date of Birth: 1971-04-18 Gender: Female Account #: [id number] Procedure:                Colonoscopy Indications:              Chronic diarrhea, urgency Medicines:                Monitored Anesthesia Care Procedure:                Pre-Anesthesia Assessment:                           - Prior to the procedure, a History and Physical                            was performed, and patient medications and                            allergies were reviewed. The patient's tolerance of                            previous anesthesia was also reviewed. The risks                            and benefits of the procedure and the sedation                            options and risks were discussed with the patient.                            All questions were answered, and informed consent                            was obtained. Prior Anticoagulants: The patient has                            taken no previous anticoagulant or antiplatelet                            agents. ASA Grade Assessment: III - A patient with                            severe systemic disease. After reviewing the risks                            and benefits, the patient was deemed in                            satisfactory condition to undergo the procedure.                           After obtaining informed consent, the colonoscope  was passed under direct vision. Throughout the                            procedure, the patient's blood pressure, pulse, and                            oxygen saturations were monitored continuously. The                            Colonoscope was introduced through the anus and                            advanced to the the terminal ileum. The colonoscopy                            was performed without  difficulty. The patient                            tolerated the procedure well. The quality of the                            bowel preparation was good. The terminal ileum,                            ileocecal valve, appendiceal orifice, and rectum                            were photographed. Scope In: 1:47:13 PM Scope Out: 1:59:38 PM Scope Withdrawal Time: 0 hours 10 minutes 32 seconds  Total Procedure Duration: 0 hours 12 minutes 25 seconds  Findings:                 The terminal ileum appeared normal.                           Three sessile polyps were found in the rectum,                            sigmoid colon and transverse colon. The polyps were                            3 to 9 mm in size. These polyps were removed with a                            cold snare. Resection and retrieval were complete.                           Biopsies for histology were taken with a cold                            forceps from the entire colon for evaluation of                            microscopic colitis.  The exam was otherwise without abnormality on                            direct and retroflexion views. Complications:            No immediate complications. Estimated blood loss:                            None. Estimated Blood Loss:     Estimated blood loss: none. Impression:               - The examined portion of the ileum was normal.                           - Three 3 to 9 mm polyps in the rectum, in the                            sigmoid colon and in the transverse colon, removed                            with a cold snare. Resected and retrieved.                           - The examination was otherwise normal on direct                            and retroflexion views.                           - Biopsies were taken with a cold forceps from the                            entire colon for evaluation of microscopic colitis. Recommendation:           - EGD  now.                           - Await pathology results. Milus Banister, MD 06/18/2019 2:01:53 PM This report has been signed electronically.

## 2019-06-18 NOTE — Progress Notes (Signed)
To PACU VSS. Report to [email or web address]

## 2019-06-18 NOTE — Patient Instructions (Addendum)
Please read all of the handouts given to you by your recovery room nurse.  You will receive a letter by mail or by my chart within two weeks or so.  Plese, read your procedure report. Thanks for choosing [name] for your healthcare needs today.  YOU HAD AN ENDOSCOPIC PROCEDURE TODAY AT [facility]:   Refer to the procedure report that was given to you for any specific questions about what was found during the examination.  If the procedure report does not answer your questions, please call your gastroenterologist to clarify.  If you requested that your care partner not be given the details of your procedure findings, then the procedure report has been included in a sealed envelope for you to review at your convenience later.  YOU SHOULD EXPECT: Some feelings of bloating in the abdomen. Passage of more gas than usual.  Walking can help get rid of the air that was put into your GI tract during the procedure and reduce the bloating. If you had a lower endoscopy (such as a colonoscopy or flexible sigmoidoscopy) you may notice spotting of blood in your stool or on the toilet paper. If you underwent a bowel prep for your procedure, you may not have a normal bowel movement for a few days.  Please Note:  You might notice some irritation and congestion in your nose or some drainage.  This is from the oxygen used during your procedure.  There is no need for concern and it should clear up in a day or so.  SYMPTOMS TO REPORT IMMEDIATELY:   Following lower endoscopy (colonoscopy or flexible sigmoidoscopy):  Excessive amounts of blood in the stool  Significant tenderness or worsening of abdominal pains  Swelling of the abdomen that is new, acute  Fever of 100F or higher   Following upper endoscopy (EGD)  Vomiting of blood or coffee ground material  New chest pain or pain under the shoulder blades  Painful or persistently difficult swallowing  New shortness of breath  Fever of 100F or  higher  Black, tarry-looking stools  For urgent or emergent issues, a gastroenterologist can be reached at any hour by calling [phone].   DIET:  We do recommend a small meal at first, but then you may proceed to your regular diet.  Drink plenty of fluids but you should avoid alcoholic beverages for 24 hours.  ACTIVITY:  You should plan to take it easy for the rest of today and you should NOT DRIVE or use heavy machinery until tomorrow (because of the sedation medicines used during the test).    FOLLOW UP: Our staff will call the number listed on your records 48-72 hours following your procedure to check on you and address any questions or concerns that you may have regarding the information given to you following your procedure. If we do not reach you, we will leave a message.  We will attempt to reach you two times.  During this call, we will ask if you have developed any symptoms of COVID 19. If you develop any symptoms (ie: fever, flu-like symptoms, shortness of breath, cough etc.) before then, please call [phone].  If you test positive for Covid 19 in the 2 weeks post procedure, please call and report this information to [name].    If any biopsies were taken you will be contacted by phone or by letter within the next 1-3 weeks.  Please call us at [phone] if you have not heard about the  biopsies in 3 weeks.    SIGNATURES/CONFIDENTIALITY: You and/or your care partner have signed paperwork which will be entered into your electronic medical record.  These signatures attest to the fact that that the information above on your After Visit Summary has been reviewed and is understood.  Full responsibility of the confidentiality of this discharge information lies with you and/or your care-partner.

## 2019-06-20 ENCOUNTER — Telehealth: Payer: Self-pay | Admitting: *Deleted

## 2019-06-20 NOTE — Telephone Encounter (Signed)
  Follow up Call-  Call back number 06/18/2019  Post procedure Call Back phone  # [phone]  Permission to leave phone message Yes  Some recent data might be hidden     Patient questions:  Do you have a fever, pain , or abdominal swelling? No.-see below Pain Score  0 *  Have you tolerated food without any problems? Yes.    Have you been able to return to your normal activities? Yes.    Do you have any questions about your discharge instructions: Diet   No. Medications  No. Follow up visit  No.  Do you have questions or concerns about your Care? No.  Pt states, "I'm having some mild cramping, like I am going to get my period."  No blood noted and has been able to eat.  I encouraged her to continue to ambulate- she may have an air pocket trapped in her colon.  She will call back if she has any further issues Actions: * If pain score is 4 or above: No action needed, pain <4.  1. Have you developed a fever since your procedure? no  2.   Have you had an respiratory symptoms (SOB or cough) since your procedure? no  3.   Have you tested positive for COVID 19 since your procedure no  4.   Have you had any family members/close contacts diagnosed with the COVID 19 since your procedure?  no   If yes to any of these questions please route to Joylene John, RN and Alphonsa Gin, Therapist, sports.

## 2019-06-20 NOTE — Telephone Encounter (Signed)
  Follow up Call-  Call back number 06/18/2019  Post procedure Call Back phone  # [phone]  Permission to leave phone message Yes  Some recent data might be hidden     Patient questions:  Message left to call us if necessary.

## 2019-06-25 ENCOUNTER — Other Ambulatory Visit: Payer: Self-pay

## 2019-06-25 MED ORDER — CHOLESTYRAMINE 4 G PO PACK: 4.0000 g | PACK | Freq: Two times a day (BID) | ORAL | 5 refills | Status: DC

## 2019-06-27 ENCOUNTER — Ambulatory Visit (INDEPENDENT_AMBULATORY_CARE_PROVIDER_SITE_OTHER): Payer: No Typology Code available for payment source

## 2019-06-27 ENCOUNTER — Encounter: Payer: Self-pay | Admitting: Obstetrics and Gynecology

## 2019-06-27 ENCOUNTER — Ambulatory Visit: Payer: No Typology Code available for payment source | Admitting: Obstetrics and Gynecology

## 2019-06-27 ENCOUNTER — Other Ambulatory Visit: Payer: Self-pay

## 2019-06-27 VITALS — BP 122/68 | HR 66 | Temp 97.6°F | Ht 59.75 in

## 2019-06-27 DIAGNOSIS — N83201 Unspecified ovarian cyst, right side: Secondary | ICD-10-CM

## 2019-06-27 DIAGNOSIS — N83202 Unspecified ovarian cyst, left side: Secondary | ICD-10-CM | POA: Diagnosis not present

## 2019-06-27 DIAGNOSIS — N3946 Mixed incontinence: Secondary | ICD-10-CM

## 2019-06-27 DIAGNOSIS — N838 Other noninflammatory disorders of ovary, fallopian tube and broad ligament: Secondary | ICD-10-CM | POA: Diagnosis not present

## 2019-06-27 NOTE — Patient Instructions (Addendum)
Diagnostic Laparoscopy Diagnostic laparoscopy is a procedure to diagnose diseases in the abdomen. It might be done for a variety of reasons, such as to look for scar tissue, cancer, or a reason for abdomen (abdominal) pain. During the procedure, a thin, flexible tube that has a light and a camera on the end (laparoscope) is inserted through an incision in the abdomen. The image from the camera is shown on a monitor to help your surgeon see inside your body. Tell a health care provider about:  Any allergies you have.  All medicines you are taking, including vitamins, herbs, eye drops, creams, and over-the-counter medicines.  Any problems you or family members have had with anesthetic medicines.  Any blood disorders you have.  Any surgeries you have had.  Any medical conditions you have. What are the risks? Generally, this is a safe procedure. However, problems may occur, including:  Infection.  Bleeding.  Allergic reactions to medicines or dyes.  Damage to abdominal structures or organs, such as the intestines, liver, stomach, or spleen. What happens before the procedure? Medicines  Ask your health care provider about: ? Changing or stopping your regular medicines. This is especially important if you are taking diabetes medicines or blood thinners. ? Taking medicines such as aspirin and ibuprofen. These medicines can thin your blood. Do not take these medicines unless your health care provider tells you to take them. ? Taking over-the-counter medicines, vitamins, herbs, and supplements.  You may be given antibiotic medicine to help prevent infection. Staying hydrated Follow instructions from your health care provider about hydration, which may include:  Up to 2 hours before the procedure - you may continue to drink clear liquids, such as water, clear fruit juice, black coffee, and plain tea. Eating and drinking restrictions Follow instructions from your health care provider  about eating and drinking, which may include:  8 hours before the procedure - stop eating heavy meals or foods such as meat, fried foods, or fatty foods.  6 hours before the procedure - stop eating light meals or foods, such as toast or cereal.  6 hours before the procedure - stop drinking milk or drinks that contain milk.  2 hours before the procedure - stop drinking clear liquids. General instructions  Ask your health care provider how your surgical site will be marked or identified.  You may be asked to shower with a germ-killing soap.  Plan to have someone take you home from the hospital or clinic.  Plan to have a responsible adult care for you for at least 24 hours after you leave the hospital or clinic. This is important. What happens during the procedure?   To lower your risk of infection: ? Your health care team will wash or sanitize their hands. ? Hair may be removed from the surgical area. ? Your skin will be washed with soap.  An IV will be inserted into one of your veins.  You will be given a medicine to make you fall asleep (general anesthetic). You may also be given a medicine to help you relax (sedative).  A breathing tube will be placed down your throat to help you breathe during the procedure.  Your abdomen will be filled with an air-like gas so it expands. This will give the surgeon more room to operate and will make your organs easier to see.  Many small incisions will be made in your abdomen.  A laparoscope and other surgical instruments will be inserted into your abdomen through the   incisions.  A tissue sample may be removed from an organ for examination (biopsy). This will depend on the reason why you are having this procedure.  The laparoscope and other instruments will be removed from your abdomen.  The gas will be released.  Your incisions will be closed with stitches (sutures) and covered with a bandage (dressing).  Your breathing tube will be  removed. The procedure may vary among health care providers and hospitals. What happens after the procedure?   Your blood pressure, heart rate, breathing rate, and blood oxygen level will be monitored until the medicines you were given have worn off.  Do not drive for 24 hours if you were given a sedative during your procedure.  It is up to you to get the results of your procedure. Ask your health care provider, or the department that is doing the procedure, when your results will be ready. Summary  Diagnostic laparoscopy is a way to look for problems in the abdomen using small incisions.  Follow instructions from your health care provider about how to prepare for the procedure.  Plan to have a responsible adult care for you for at least 24 hours after you leave the hospital or clinic. This is important. This information is not intended to replace advice given to you by your health care provider. Make sure you discuss any questions you have with your health care provider. Document Released: 11/07/2000 Document Revised: 07/14/2017 Document Reviewed: 01/25/2017 Elsevier Patient Education  2020 [employer].  Ovarian Cyst     An ovarian cyst is a fluid-filled sac that forms on an ovary. The ovaries are small organs that produce eggs in women. Various types of cysts can form on the ovaries. Some may cause symptoms and require treatment. Most ovarian cysts go away on their own, are not cancerous (are benign), and do not cause problems. Common types of ovarian cysts include:  Functional (follicle) cysts. ? Occur during the menstrual cycle, and usually go away with the next menstrual cycle if you do not get pregnant. ? Usually cause no symptoms.  Endometriomas. ? Are cysts that form from the tissue that lines the uterus (endometrium). ? Are sometimes called "chocolate cysts" because they become filled with blood that turns brown. ? Can cause pain in the lower abdomen during intercourse  and during your period.  Cystadenoma cysts. ? Develop from cells on the outside surface of the ovary. ? Can get very large and cause lower abdomen pain and pain with intercourse. ? Can cause severe pain if they twist or break open (rupture).  Dermoid cysts. ? Are sometimes found in both ovaries. ? May contain different kinds of body tissue, such as skin, teeth, hair, or cartilage. ? Usually do not cause symptoms unless they get very big.  Theca lutein cysts. ? Occur when too much of a certain hormone (human chorionic gonadotropin) is produced and overstimulates the ovaries to produce an egg. ? Are most common after having procedures used to assist with the conception of a baby (in vitro fertilization). What are the causes? Ovarian cysts may be caused by:  Ovarian hyperstimulation syndrome. This is a condition that can develop from taking fertility medicines. It causes multiple large ovarian cysts to form.  Polycystic ovarian syndrome (PCOS). This is a common hormonal disorder that can cause ovarian cysts, as well as problems with your period or fertility. What increases the risk? The following factors may make you more likely to develop ovarian cysts:  Being overweight or  obese.  Taking fertility medicines.  Taking certain forms of hormonal birth control.  Smoking. What are the signs or symptoms? Many ovarian cysts do not cause symptoms. If symptoms are present, they may include:  Pelvic pain or pressure.  Pain in the lower abdomen.  Pain during sex.  Abdominal swelling.  Abnormal menstrual periods.  Increasing pain with menstrual periods. How is this diagnosed? These cysts are commonly found during a routine pelvic exam. You may have tests to find out more about the cyst, such as:  Ultrasound.  X-ray of the pelvis.  CT scan.  MRI.  Blood tests. How is this treated? Many ovarian cysts go away on their own without treatment. Your health care provider may want  to check your cyst regularly for 2-3 months to see if it changes. If you are in menopause, it is especially important to have your cyst monitored closely because menopausal women have a higher rate of ovarian cancer. When treatment is needed, it may include:  Medicines to help relieve pain.  A procedure to drain the cyst (aspiration).  Surgery to remove the whole cyst.  Hormone treatment or birth control pills. These methods are sometimes used to help dissolve a cyst. Follow these instructions at home:  Take over-the-counter and prescription medicines only as told by your health care provider.  Do not drive or use heavy machinery while taking prescription pain medicine.  Get regular pelvic exams and Pap tests as often as told by your health care provider.  Return to your normal activities as told by your health care provider. Ask your health care provider what activities are safe for you.  Do not use any products that contain nicotine or tobacco, such as cigarettes and e-cigarettes. If you need help quitting, ask your health care provider.  Keep all follow-up visits as told by your health care provider. This is important. Contact a health care provider if:  Your periods are late, irregular, or painful, or they stop.  You have pelvic pain that does not go away.  You have pressure on your bladder or trouble emptying your bladder completely.  You have pain during sex.  You have any of the following in your abdomen: ? A feeling of fullness. ? Pressure. ? Discomfort. ? Pain that does not go away. ? Swelling.  You feel generally ill.  You become constipated.  You lose your appetite.  You develop severe acne.  You start to have more body hair and facial hair.  You are gaining weight or losing weight without changing your exercise and eating habits.  You think you may be pregnant. Get help right away if:  You have abdominal pain that is severe or gets worse.  You  cannot eat or drink without vomiting.  You suddenly develop a fever.  Your menstrual period is much heavier than usual. This information is not intended to replace advice given to you by your health care provider. Make sure you discuss any questions you have with your health care provider. Document Released: 08/01/2005 Document Revised: 10/30/2017 Document Reviewed: 01/03/2016 Elsevier Patient Education  2020 [employer].

## 2019-06-27 NOTE — Progress Notes (Signed)
GYNECOLOGY  VISIT   HPI: 48 y.o.   Married  Caucasian  female   [id number] with Patient's last menstrual period was 09/26/2015 (exact date).   here for  Recheck of ovarian cysts.  Husband is present for discussion portion of the visit today.   Patient was seen in the ER at [facility] on 05/08/19 for GI symptoms, abdominal pain and finding of a left ovarian cyst 7.3 x 5.8 x. 7.1 cm.  Difficult to do doppler studies due to the lack of ovarian tissue.   She did not have an acute abdomen, so outpatient follow up was planned.  She tested negative for Covid and had a wet prep showing BV. GC/CT were both negative.   Follow up pelvic US on 05/14/19: Absent uterus.  Normal vaginal cuff.  Right ovarian normal size with 1.5 x 1.2 cm avascular nodule - fibroma versus endometrioma.  Left ovarian unilocular cyst 6.2 cm x 6.1 cm x 5.4 cm with no abnormal internal blood flow. Tender with ultrasound examination.  No free fluid.    CA125 7.0 on 05/14/19.   She does have a dx of C Diff treated this spring and summer.   Currently she is having cramping sensation for right and left side.  Still with waves of nausea.  Had an endoscopy and colonoscopy.  She had a hiatal hernia, gastritis and polyps.  She is having night sweats.   She is having urinary incontinence with a cough, laugh, and sneeze, and she would like help for this.  DF - every hour. Some urgency to get to bathroom on time.  NF - at least once.  Denies enuresis.  She has frequent BM.   She now has an Rx for [place].   GYNECOLOGIC HISTORY: Patient's last menstrual period was 09/26/2015 (exact date). Contraception:  Hysterectomy. Menopausal hormone therapy:  NA Last mammogram:  04-01-19 Diag.Bil w/US Neg/density C/biRads2 Last pap smear:  08/20/2015 normal. Final pathology of cervix at hysterectomy - benign.         OB History    Gravida  3   Para  3   Term  3   Preterm      AB      Living        SAB      TAB      Ectopic       Multiple      Live Births                 Patient Active Problem List   Diagnosis Date Noted  . Left ovarian cyst 05/15/2019  . Right ovarian cyst 05/15/2019  . Vitamin D deficiency 12/14/2017  . 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 Genetic testing 06/20/2016  . Family history of breast cancer in female 05/12/2016  . Family history of colorectal cancer 05/12/2016  . Fatty liver 02/22/2016  . Abnormal mammogram of left breast 01/22/2016  . Breast cyst 11/21/2014  . Menorrhagia 11/21/2014  . Hypothyroidism 11/21/2014  . Obesity 11/21/2014  . Seasonal allergies 11/21/2014  . Asthma, chronic 11/21/2014  . Generalized anxiety disorder 11/21/2014    Past Medical History:  Diagnosis Date  . Abnormal mammogram of left breast 01/22/2016  . Anemia 11/21/2014   -with elevated platelets   . Anxiety   . Asthma   . Atypical nevus 10/10/2016   Left Inner Thigh - Mild  . Breast cyst 11/21/2014   -L breast, upper L just off from center -stable per radiology 10/2014   .  Constipation   . Dyspnea   . Fatty liver   . Gallbladder problem   . Gallstone pancreatitis 03/14/2016  . Gastritis   . Generalized anxiety disorder 11/21/2014  . GERD (gastroesophageal reflux disease)   . Hiatal hernia   . Hypothyroidism   . IBS (irritable bowel syndrome)   . Menorrhagia 11/21/2014   -seeing gyn    . Obesity 11/21/2014  . Ovarian cyst    Bilateral  . Plantar fasciitis of left foot 11/21/2014  . Seasonal allergies 11/21/2014  . 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 Thyroid disease     Past Surgical History:  Procedure Laterality Date  . ABDOMINAL HYSTERECTOMY    . BREAST BIOPSY    . BREAST EXCISIONAL BIOPSY    . BREAST LUMPECTOMY WITH RADIOACTIVE SEED LOCALIZATION Left 01/22/2016   Procedure: LEFT BREAST LUMPECTOMY WITH RADIOACTIVE SEED LOCALIZATION;  Surgeon: Fanny Skates, MD;  Location: [facility];  Service: General;  Laterality:  Left;  . CESAREAN SECTION     x3  . CHOLECYSTECTOMY N/A 03/15/2016   Procedure: LAPAROSCOPIC CHOLECYSTECTOMY WITH INTRAOPERATIVE CHOLANGIOGRAM;  Surgeon: Jackolyn Confer, MD;  Location: [facility];  Service: General;  Laterality: N/A;  . COLONOSCOPY  06/18/2019  . CYSTO N/A 10/06/2015   Procedure: CYSTOSCOPY;  Surgeon: Nunzio Cobbs, MD;  Location: [facility];  Service: Gynecology;  Laterality: N/A;  . TUBAL LIGATION    . UPPER GASTROINTESTINAL ENDOSCOPY  06/18/2019    Current Outpatient Medications  Medication Sig Dispense Refill  . albuterol (PROVENTIL) (2.5 MG/3ML) 0.083% nebulizer solution Take 3 mLs (2.5 mg total) by nebulization every 6 (six) hours as needed for wheezing or shortness of breath. 75 mL 0  . albuterol (VENTOLIN HFA) 108 (90 Base) MCG/ACT inhaler INHALE 1 PUFF INTO THE LUNGS EVERY 6 (SIX) HOURS AS NEEDED FOR WHEEZING OR SHORTNESS OF BREATH. 18 g 0  . cholestyramine (QUESTRAN) 4 g packet Take 1 packet (4 g total) by mouth 2 (two) times daily. 60 each 5  . levothyroxine (SYNTHROID) 88 MCG tablet TAKE 1 TABLET BY MOUTH DAILY. (Patient taking differently: Take 88 mcg by mouth daily. ) 90 tablet 1  . liothyronine (CYTOMEL) 5 MCG tablet TAKE 2 TABLETS BY MOUTH EVERY MORNING WITH LEVOTHYROXINE (Patient taking differently: Take 10 mcg by mouth daily. ) 180 tablet 2  . loratadine (CLARITIN) 10 MG tablet Take 1 tablet (10 mg total) by mouth daily. 90 tablet 0  . norethindrone (MICRONOR) 0.35 MG tablet Take 1 tablet (0.35 mg total) by mouth daily. 3 Package 0  . venlafaxine XR (EFFEXOR-XR) 150 MG 24 hr capsule TAKE 1 CAPSULE BY MOUTH ONCE DAILY WITH FOOD (MUST MAKE APPT) 30 capsule 0   No current facility-administered medications for this visit.      ALLERGIES: Patient has no known allergies.  Family History  Problem Relation Age of Onset  . Breast cancer Mother 78       s/p lump and rad  . Skin cancer Mother        non-melanoma  . Thyroid disease Mother   . Skin cancer  Father        non-melanoma  . Hypertension Father   . Hyperlipidemia Father   . Breast cancer Paternal Grandmother        dx 68s; s/p mastectomy  . Cervical cancer Maternal Grandmother   . Arthritis Maternal Grandmother   . CVA Maternal Grandmother   . Stroke Maternal Grandmother 80  .  Heart attack Maternal Grandfather        d. 21s  . Colon cancer Paternal Grandfather 103  . Rectal cancer Maternal Uncle 54  . Aortic aneurysm Maternal Uncle 53  . Uterine cancer Other        maternal great grandmother (MGM's mother)  . Stroke Other   . Cancer Other        maternal great grandfather (MGM's father); NOS cancer  . Breast cancer Other        maternal great grandmother (MGF's mother)  . Colon cancer Other        maternal great grandfather (MGF's father)  . Heart attack Other   . Colon cancer Other        paternal great grandfather (PGF's father)  . Heart attack Other   . Stomach cancer Neg Hx   . Pancreatic cancer Neg Hx   . Esophageal cancer Neg Hx     Social History   Socioeconomic History  . Marital status: Married    Spouse name: Elberta Fortis Mcneil  . Number of children: 3  . Years of education: Not on file  . Highest education level: Not on file  Occupational History  . Occupation: Government social research officer  Social Needs  . Financial resource strain: Not on file  . Food insecurity    Worry: Not on file    Inability: Not on file  . Transportation needs    Medical: Not on file    Non-medical: Not on file  Tobacco Use  . Smoking status: Former Smoker    Types: Cigarettes  . Smokeless tobacco: Never Used  Substance and Sexual Activity  . Alcohol use: Yes    Alcohol/week: 1.0 standard drinks    Types: 1 Glasses of wine per week    Comment: socially  . Drug use: No  . Sexual activity: Yes    Partners: Male    Birth control/protection: Surgical    Comment: Tubal  Lifestyle  . Physical activity    Days per week: Not on file    Minutes per session: Not on file  .  Stress: Not on file  Relationships  . Social Herbalist on phone: Not on file    Gets together: Not on file    Attends religious service: Not on file    Active member of club or organization: Not on file    Attends meetings of clubs or organizations: Not on file    Relationship status: Not on file  . Intimate partner violence    Fear of current or ex partner: Not on file    Emotionally abused: Not on file    Physically abused: Not on file    Forced sexual activity: Not on file  Other Topics Concern  . Not on file  Social History Narrative  . Not on file    Review of Systems  PHYSICAL EXAMINATION:    BP 122/68 (Cuff Size: Large)   Pulse 66   Temp 97.6 F (36.4 C) (Temporal)   Ht 4' 11.75" (1.518 m)   LMP 09/26/2015 (Exact Date)   BMI 43.13 kg/m     General appearance: alert, cooperative and appears stated age  Pelvic US  Uterus absent.  Right ovary with 14 x 8 mm cyst with solid versus fatty center and peripheral calcifications. Avascular.  Left ovary with 65 x 69 58 mm thin walled cyst and smaller cyst within it. Avascular. No real ovarian tissue identified.  No free fluid.  ASSESSMENT  Status post TAH/bilateral salpingectomy/LOA. Bilateral ovarian cysts?  Right fibroma versus dermoid cyst and left ovarian cyst, possible cystadenoma.  I suspect these are benign in nature.  Normal CA125.  Smoker.   Recent C Difficile.       Hiatal hernia.  Gastritis.    Genuine stress incontinence.  Likely also has a component of overactive bladder.  PLAN  We discussed her bilateral ovarian cysts at length and the persistent nature of the large left ovarian cyst.   We reviewed observational management with possible risk of torsion versus laparoscopic left oophorectomy and possible right oophorectomy.  Risks of laparoscopy include but are not limited to bleeding, infection, damage to surrounding organs, reaction to anesthesia, pneumonia, DVT, PE, death, need for further  surgery, and laparotomy to complete the procedure. We discussed surgical menopause if both ovaries are removed and possible treatment options with estrogen therapy +/- testosterone treatment, SSRIs, SNRIs, and Gabapentin to treat menopausal symptoms.  Stress incontinence also reviewed and options for pelvic floor PT versus midurethral sling with cystoscopy.  We reviewed efficacy of the midurethral sling being 85 - 90% for treatment of stress incontinence and discussed the use of permanent mesh to perform this. Specific risks of the midurethral sling can include cystotomy, urinary retention, mesh erosion/exposure requiring surgical care to treat it, urinary tract infection, urinary urgency, and slower voiding.  Will proceed with urodynamic testing in preparation for potential surgical care.  ACOG HO on urinary incontinence and surgical treatment of urinary incontinence.   An After Visit Summary was printed and given to the patient.  __40____ minutes face to face time of which over 50% was spent in counseling.

## 2019-06-28 ENCOUNTER — Telehealth: Payer: Self-pay | Admitting: Obstetrics and Gynecology

## 2019-06-28 NOTE — Telephone Encounter (Signed)
Spoke with patient. Currently on Micronor for ovarian cyst. Reports no change in cyst, was seen in office in office on 06/27/19 for rpt PUS. Patient asking if she needs to continue micronor?   Advised patient Dr. Quincy Simmonds is not in the office today. Recommended patient continue Micronor, I will send to Dr. Quincy Simmonds to review and f/u with additional recommendations on 07/01/19. Patient agreeable.   Dr. Quincy Simmonds -please advise on Micronor.

## 2019-06-28 NOTE — Telephone Encounter (Signed)
Patient was seen yesterday and forgot to ask Dr.Silva if she should continue taking her birth control?

## 2019-06-29 NOTE — Telephone Encounter (Signed)
She may stop the Micronor.

## 2019-06-30 ENCOUNTER — Encounter: Payer: Self-pay | Admitting: Obstetrics and Gynecology

## 2019-07-01 NOTE — Telephone Encounter (Signed)
Call to patient, no answer, left detailed message, name identified on voicemail. Advised per Dr. Quincy Simmonds. Return call to office if any additional questions.   Encounter closed.

## 2019-07-03 ENCOUNTER — Telehealth: Payer: Self-pay | Admitting: Obstetrics and Gynecology

## 2019-07-03 NOTE — Telephone Encounter (Signed)
Patient is waiting on call to schedule urodynamics testing.

## 2019-07-03 NOTE — Telephone Encounter (Signed)
Routing to S. Yeakley, RN.  

## 2019-07-04 NOTE — Telephone Encounter (Signed)
Return call to patient. Per ROI, can leave message on voice mail which has name confirmation.  Left message to call back to review scheduling and date options.

## 2019-07-05 NOTE — Telephone Encounter (Signed)
Patient is returning call to Sally.  

## 2019-07-05 NOTE — Telephone Encounter (Signed)
Patient returning call to Sally. °

## 2019-07-05 NOTE — Telephone Encounter (Signed)
Return call to patient. Left message regarding limited available dates remaining for surgery this year. Advised that due to increasing Covid cases, elective surgery could potentially be delayed or canceled.  Left message to call back.

## 2019-07-05 NOTE — Telephone Encounter (Signed)
Call to patient. Reviewed urodynamic testing and surgery options. Advised very limited options this year.  Will call back as soon as able to confirm schedule date.

## 2019-07-15 ENCOUNTER — Other Ambulatory Visit: Payer: Self-pay

## 2019-07-15 MED ORDER — LIOTHYRONINE SODIUM 5 MCG PO TABS: 10.0000 ug | ORAL_TABLET | Freq: Every day | ORAL | 2 refills | Status: DC

## 2019-07-15 MED FILL — LEVOTHYROXINE 88 MCG TABLET: 88 | 90 days supply | Qty: 90 | Fill #1

## 2019-07-15 MED FILL — LIOTHYRONINE SODIUM 5 MCG T: 5 | 30 days supply | Qty: 60 | Fill #0

## 2019-07-17 ENCOUNTER — Other Ambulatory Visit: Payer: Self-pay

## 2019-07-17 ENCOUNTER — Ambulatory Visit (INDEPENDENT_AMBULATORY_CARE_PROVIDER_SITE_OTHER): Payer: No Typology Code available for payment source

## 2019-07-17 VITALS — BP 122/74 | HR 96 | Temp 97.0°F | Ht 59.0 in

## 2019-07-17 DIAGNOSIS — G255 Other chorea: Secondary | ICD-10-CM

## 2019-07-17 DIAGNOSIS — Z01812 Encounter for preprocedural laboratory examination: Secondary | ICD-10-CM | POA: Diagnosis not present

## 2019-07-17 LAB — POCT URINALYSIS DIPSTICK
Bilirubin, UA: NEGATIVE
Glucose, UA: NEGATIVE
Ketones, UA: NEGATIVE
Leukocytes, UA: NEGATIVE
Nitrite, UA: NEGATIVE
Protein, UA: NEGATIVE
Urobilinogen, UA: NEGATIVE E.U./dL — AB
pH, UA: 6.5 (ref 5.0–8.0)

## 2019-07-17 NOTE — Progress Notes (Signed)
Patient here for pre procedure urinalysis. Patient denies pain, burning, or urgency. States she is feeling fine.  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   Routing to provider for finial review.

## 2019-07-17 NOTE — Telephone Encounter (Signed)
Call to patient. Patient advised of available surgery date of 08-13-2019 will need urodynamics prior. Patient agreeable. Patient scheduled for urodynamics on 07-22-2019 at 0915. Urodynamics information form reviewed with patient and she verbalized understanding. Pre procedure UA scheduled for 07-17-2019 at 1515. Patient agreeable to date and time of appointment. Will schedule surgery consult/urodynamics consult at procedure appointment on Monday. Patient agreeable.   Routing to provider for review.   Cc Lamont Snowball, RN

## 2019-07-18 LAB — URINALYSIS, MICROSCOPIC ONLY
Casts: NONE SEEN /lpf
Epithelial Cells (non renal): >10 /hpf — AB (ref 0–10)

## 2019-07-18 NOTE — Telephone Encounter (Signed)
Encounter closed

## 2019-07-18 NOTE — Telephone Encounter (Signed)
Encounter reviewed.  Ok to close.

## 2019-07-19 LAB — URINE CULTURE

## 2019-07-22 ENCOUNTER — Other Ambulatory Visit: Payer: Self-pay

## 2019-07-22 ENCOUNTER — Ambulatory Visit (INDEPENDENT_AMBULATORY_CARE_PROVIDER_SITE_OTHER): Payer: No Typology Code available for payment source | Admitting: *Deleted

## 2019-07-22 VITALS — BP 118/78 | HR 90 | Temp 97.8°F | Resp 14 | Ht 59.0 in | Wt 219.0 lb

## 2019-07-22 DIAGNOSIS — N3946 Mixed incontinence: Secondary | ICD-10-CM

## 2019-07-22 NOTE — Progress Notes (Addendum)
Maria [name] is a 48 y.o. female Who presents today for urodynamics testing, ordered by Dr. Quincy Simmonds.    Allergies and medications reviewed.  Denies complaints today. No urinary complaints.   Urine Micro exam: negative for WBC's or RBC's, okay to proceed per Dr. Quincy Simmonds.  Patient reports urinary leakage with coughing, sneezing, exercise and laughing.   Urodynamics testing initiated. Lumax Bladder Catheter #10 [place] and lumax Abdominal Catheter #10 [place].   Post void residual= drops.    Urethral catheter placed without issue. Rectal catheter placed without issue.   Urodynamics testing completed. Please see scanned Patient summary report in Epic. Procedure completed and patient tolerated well without complaints. Patient scheduled for follow up office visit on 07-29-2019 with Dr. Quincy Simmonds to discuss results. Patient agreeable.   Patient given post procedure instructions:  You may have a mild bladder and rectal discomfort for a few hours after the test. You may experience some frequent urination and slight burning the first few times you urinate after the test. Rarely, the urine may be blood tinged. These are both due to catheter placements and resolve quickly. You should call our office immediately if you have signs of infection, which may include bladder pain, urinary urgency, fever, or burning during urination. We do encourage you to drink plenty of water after the test.     Surgery information form reviewed in detail with patient at appointment. Pre and post op appointments scheduled for patient and she is agreeable to date and time of all appointments.

## 2019-07-25 NOTE — Progress Notes (Signed)
GYNECOLOGY  VISIT   HPI: 48 y.o.   Married  Caucasian  female   [id number] with Patient's last menstrual period was 09/26/2015 (exact date).   here for surgical consult and discuss urodynamics. Her husband is present for the visit today.   The patient has a dx of large left ovarian cyst of 7 cm and a right ovarian nodule by pelvic US.  Her presenting symptom was abdominal pain in the [facility] ER on 05/08/19, and she has been followed as an outpatient.  Her CA125 was 7.0 on 05/14/19.  Her GC/CT were negative at her ER visit.   During the course of her outpatient care, she shared a history of urinary incontinence with cough, laugh, and sneeze.  She also reported urinary frequency and urgency. She requested assistance for this, and therefore underwent further evaluation.   Urodynamic testing (07/22/19): Uroflow - void 12 cc, not continuous.  PVR drops. CMG S1 7 cc, S2 61 cc, S3 153 cc.  Max capacity 262 cc.  VLPP 166 cm H2O at 262 cc.  UPP 18 cm H2O.  Pressure flow PDet max 44 cm H2O.  Voided 307 cc.   She currently reports left lower quadrant discomfort with rolling over in bed.   Is having hot flashes.   GYNECOLOGIC HISTORY: Patient's last menstrual period was 09/26/2015 (exact date). Contraception: Hysterectomy Menopausal hormone therapy:  n/a Last mammogram:  04-01-19 Diag.Bil w/US Neg/density C/biRads2 Last pap smear:  08/20/2015 normal. Final pathology of cervix at hysterectomy - benign.                OB History    Gravida  3   Para  3   Term  3   Preterm      AB      Living        SAB      TAB      Ectopic      Multiple      Live Births                 Patient Active Problem List   Diagnosis Date Noted  . Left ovarian cyst 05/15/2019  . Right ovarian cyst 05/15/2019  . Vitamin D deficiency 12/14/2017  . 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 Genetic testing 06/20/2016  . Family history of breast cancer in female 05/12/2016  . Family history of  colorectal cancer 05/12/2016  . Fatty liver 02/22/2016  . Abnormal mammogram of left breast 01/22/2016  . Breast cyst 11/21/2014  . Menorrhagia 11/21/2014  . Hypothyroidism 11/21/2014  . Obesity 11/21/2014  . Seasonal allergies 11/21/2014  . Asthma, chronic 11/21/2014  . Generalized anxiety disorder 11/21/2014    Past Medical History:  Diagnosis Date  . Abnormal mammogram of left breast 01/22/2016  . Anemia 11/21/2014   -with elevated platelets   . Anxiety   . Asthma   . Atypical nevus 10/10/2016   Left Inner Thigh - Mild  . Breast cyst 11/21/2014   -L breast, upper L just off from center -stable per radiology 10/2014   . Constipation   . Dyspnea   . Fatty liver   . Gallbladder problem   . Gallstone pancreatitis 03/14/2016  . Gastritis   . Generalized anxiety disorder 11/21/2014  . GERD (gastroesophageal reflux disease)   . Hiatal hernia   . Hypothyroidism   . IBS (irritable bowel syndrome)   . Menorrhagia 11/21/2014   -seeing gyn    . Obesity 11/21/2014  . Ovarian cyst  Bilateral  . Plantar fasciitis of left foot 11/21/2014  . Seasonal allergies 11/21/2014  . 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 Thyroid disease     Past Surgical History:  Procedure Laterality Date  . ABDOMINAL HYSTERECTOMY    . BREAST BIOPSY    . BREAST EXCISIONAL BIOPSY    . BREAST LUMPECTOMY WITH RADIOACTIVE SEED LOCALIZATION Left 01/22/2016   Procedure: LEFT BREAST LUMPECTOMY WITH RADIOACTIVE SEED LOCALIZATION;  Surgeon: Fanny Skates, MD;  Location: [facility];  Service: General;  Laterality: Left;  . CESAREAN SECTION     x3  . CHOLECYSTECTOMY N/A 03/15/2016   Procedure: LAPAROSCOPIC CHOLECYSTECTOMY WITH INTRAOPERATIVE CHOLANGIOGRAM;  Surgeon: Jackolyn Confer, MD;  Location: [facility];  Service: General;  Laterality: N/A;  . COLONOSCOPY  06/18/2019  . CYSTO N/A 10/06/2015   Procedure: CYSTOSCOPY;  Surgeon: Nunzio Cobbs, MD;   Location: [facility];  Service: Gynecology;  Laterality: N/A;  . TUBAL LIGATION    . UPPER GASTROINTESTINAL ENDOSCOPY  06/18/2019    Current Outpatient Medications  Medication Sig Dispense Refill  . albuterol (PROVENTIL) (2.5 MG/3ML) 0.083% nebulizer solution Take 3 mLs (2.5 mg total) by nebulization every 6 (six) hours as needed for wheezing or shortness of breath. 75 mL 0  . albuterol (VENTOLIN HFA) 108 (90 Base) MCG/ACT inhaler INHALE 1 PUFF INTO THE LUNGS EVERY 6 (SIX) HOURS AS NEEDED FOR WHEEZING OR SHORTNESS OF BREATH. 18 g 0  . cholestyramine (QUESTRAN) 4 g packet Take 1 packet (4 g total) by mouth 2 (two) times daily. 60 each 5  . levothyroxine (SYNTHROID) 88 MCG tablet TAKE 1 TABLET BY MOUTH DAILY. (Patient taking differently: Take 88 mcg by mouth daily. ) 90 tablet 1  . liothyronine (CYTOMEL) 5 MCG tablet Take 2 tablets (10 mcg total) by mouth daily. 60 tablet 2  . loratadine (CLARITIN) 10 MG tablet Take 1 tablet (10 mg total) by mouth daily. 90 tablet 0  . norethindrone (MICRONOR) 0.35 MG tablet Take 1 tablet (0.35 mg total) by mouth daily. 3 Package 0  . venlafaxine XR (EFFEXOR-XR) 150 MG 24 hr capsule TAKE 1 CAPSULE BY MOUTH ONCE DAILY WITH FOOD (MUST MAKE APPT) 30 capsule 0   No current facility-administered medications for this visit.     ALLERGIES: Patient has no known allergies.  Family History  Problem Relation Age of Onset  . Breast cancer Mother 72       s/p lump and rad  . Skin cancer Mother        non-melanoma  . Thyroid disease Mother   . Skin cancer Father        non-melanoma  . Hypertension Father   . Hyperlipidemia Father   . Breast cancer Paternal Grandmother        dx 50s; s/p mastectomy  . Cervical cancer Maternal Grandmother   . Arthritis Maternal Grandmother   . CVA Maternal Grandmother   . Stroke Maternal Grandmother 80  . Heart attack Maternal Grandfather        d. 65s  . Colon cancer Paternal Grandfather 23  . Rectal cancer Maternal Uncle 54  .  Aortic aneurysm Maternal Uncle 53  . Uterine cancer Other        maternal great grandmother (MGM's mother)  . Stroke Other   . Cancer Other        maternal great grandfather (MGM's father); NOS cancer  . Breast cancer Other  maternal great grandmother (MGF's mother)  . Colon cancer Other        maternal great grandfather (MGF's father)  . Heart attack Other   . Colon cancer Other        paternal great grandfather (PGF's father)  . Heart attack Other   . Stomach cancer Neg Hx   . Pancreatic cancer Neg Hx   . Esophageal cancer Neg Hx     Social History   Socioeconomic History  . Marital status: Married    Spouse name: Elberta Fortis Pion  . Number of children: 3  . Years of education: Not on file  . Highest education level: Not on file  Occupational History  . Occupation: Government social research officer  Tobacco Use  . Smoking status: Former Smoker    Types: Cigarettes  . Smokeless tobacco: Never Used  Substance and Sexual Activity  . Alcohol use: Yes    Alcohol/week: 1.0 standard drinks    Types: 1 Glasses of wine per week    Comment: socially  . Drug use: No  . Sexual activity: Yes    Partners: Male    Birth control/protection: Surgical    Comment: Tubal  Other Topics Concern  . Not on file  Social History Narrative  . Not on file   Social Determinants of Health   Financial Resource Strain:   . Difficulty of Paying Living Expenses: Not on file  Food Insecurity:   . Worried About Charity fundraiser in the Last Year: Not on file  . Ran Out of Food in the Last Year: Not on file  Transportation Needs:   . Lack of Transportation (Medical): Not on file  . Lack of Transportation (Non-Medical): Not on file  Physical Activity:   . Days of Exercise per Week: Not on file  . Minutes of Exercise per Session: Not on file  Stress:   . Feeling of Stress : Not on file  Social Connections:   . Frequency of Communication with Friends and Family: Not on file  . Frequency of  Social Gatherings with Friends and Family: Not on file  . Attends Religious Services: Not on file  . Active Member of Clubs or Organizations: Not on file  . Attends Archivist Meetings: Not on file  . Marital Status: Not on file  Intimate Partner Violence:   . Fear of Current or Ex-Partner: Not on file  . Emotionally Abused: Not on file  . Physically Abused: Not on file  . Sexually Abused: Not on file    Review of Systems  All other systems reviewed and are negative.   PHYSICAL EXAMINATION:    BP 120/82 (Cuff Size: Large)   Pulse 70   Temp (!) 97.3 F (36.3 C) (Temporal)   Ht 4' 11.75" (1.518 m)   Wt 218 lb (98.9 kg)   LMP 09/26/2015 (Exact Date)   BMI 42.93 kg/m     General appearance: alert, cooperative and appears stated age Head: Normocephalic, without obvious abnormality, atraumatic Lungs: clear to auscultation bilaterally Heart: regular rate and rhythm Abdomen: soft, non-tender, no masses,  no organomegaly Extremities: extremities normal, atraumatic, no cyanosis or edema Skin: Skin color, texture, turgor normal. No rashes or lesions No abnormal inguinal nodes palpated Neurologic: Grossly normal  Pelvic: External genitalia:  no lesions              Urethra:  normal appearing urethra with no masses, tenderness or lesions  Bartholins and Skenes: normal                 Vagina: normal appearing vagina with normal color and discharge, no lesions              Cervix: absent.                Bimanual Exam:  Uterus: absent              Adnexa: no mass, fullness, tenderness              Rectal exam: Yes.  .  Confirms.              Anus:  normal sphincter tone, no lesions  Chaperone was present for exam.  ASSESSMENT  Large left ovarian cyst.  Right ovarian nodule. Genuine stress incontinence. Low bladder capacity.   PLAN  We reviewed laparoscopic left oophorectomy and possible right oophorectomy.  Risks, benefits, and alternatives reviewed.   Discussion of surgical menopause with removal of both ovaries and potential estrogen and testosterone treatment if needed.   Extensive discussion regarding urodynamic testing and genuine stress incontinence risk factors and options for care - midurethral sling, pelvic floor therapy, and weight loss. Midurethral sling reviewed in detail - permanent nature of the mesh material reviewed, efficacy 85-90% and potential specific risks including cystotomy, urinary retention, mesh erosion/exposure requiring surgical care to treat it, urinary tract infection, urinary urgency, and slower voiding.   We discussed general principles of surgical care and balancing the risks of the nonemergent but indicated procedures with the benefits of proceeding forward and improving life quality.  The patient will consider her options and make a final decision regarding proceeding forward surgically.   She understands she will need preop Covid 19 testing prior to surgery.   An After Visit Summary was printed and given to the patient.  __40____ minutes face to face time of which over 50% was spent in counseling.

## 2019-07-29 ENCOUNTER — Other Ambulatory Visit: Payer: Self-pay | Admitting: Family Medicine

## 2019-07-29 ENCOUNTER — Other Ambulatory Visit: Payer: Self-pay

## 2019-07-29 ENCOUNTER — Ambulatory Visit (INDEPENDENT_AMBULATORY_CARE_PROVIDER_SITE_OTHER): Payer: No Typology Code available for payment source | Admitting: Obstetrics and Gynecology

## 2019-07-29 ENCOUNTER — Encounter: Payer: Self-pay | Admitting: Obstetrics and Gynecology

## 2019-07-29 VITALS — BP 120/82 | HR 70 | Temp 97.3°F | Ht 59.75 in | Wt 218.0 lb

## 2019-07-29 DIAGNOSIS — N83202 Unspecified ovarian cyst, left side: Secondary | ICD-10-CM

## 2019-07-29 DIAGNOSIS — N838 Other noninflammatory disorders of ovary, fallopian tube and broad ligament: Secondary | ICD-10-CM

## 2019-07-29 DIAGNOSIS — N393 Stress incontinence (female) (male): Secondary | ICD-10-CM | POA: Diagnosis not present

## 2019-07-31 ENCOUNTER — Telehealth: Payer: Self-pay | Admitting: Obstetrics and Gynecology

## 2019-07-31 NOTE — Telephone Encounter (Signed)
Patient is calling regarding surgery plans. Patient stated that she "just wants to do the ovaries, not the bladder."

## 2019-08-01 NOTE — Telephone Encounter (Signed)
All to patient. Call to confirm plan to cancel TVT.  Surgical post op appointments adjusted to reflect two week post op with Dr Quincy Simmonds on 08-26-19.  Advised of no visitor policy in ambulatory setting due to Covid cases.   Routing to provider, encounter closed.

## 2019-08-07 ENCOUNTER — Other Ambulatory Visit: Payer: Self-pay

## 2019-08-07 ENCOUNTER — Encounter (HOSPITAL_BASED_OUTPATIENT_CLINIC_OR_DEPARTMENT_OTHER): Payer: Self-pay | Admitting: Obstetrics and Gynecology

## 2019-08-07 NOTE — Progress Notes (Signed)
Spoke w/ via phone for pre-op interview--- PT Lab needs dos---- CBC, BMP, T&S              Lab results------no COVID test ------ 08-10-2019 @ 1225 Arrive at ------- 0800 NPO after ------ [place] Medications to take morning of surgery ----- Synthroid, Cytomel, Claritin, Effexor w/ sips of water Diabetic medication ----- n/a Patient Special Instructions ----- asked to bring rescue inhaler dos Pre-Op special Istructions ----- n/a Patient verbalized understanding of instructions that were given at this phone interview. Patient denies shortness of breath, chest pain, fever, cough a this phone interview.

## 2019-08-07 NOTE — H&P (Signed)
Office Visit  07/29/2019 [place] Silva, Everardo All, MD Obstetrics and Gynecology  Left ovarian cyst +2 more Dx  Surgery consult ; Referred by Lucretia Kern, DO Reason for Visit  Additional Documentation  Vitals:    BP 120/82 (Cuff Size: Large)  Pulse 70  Temp 97.3 F (36.3 C)  (Temporal)  Ht 4' 11.75" (1.518 m)  Wt 98.9 kg  LMP 09/26/2015 (Exact Date)  BMI 42.93 kg/m  BSA 2.04 m    More Vitals  Flowsheets:    Anthropometrics,  NEWS,  MEWS Score,  Method of Visit    Encounter Info:   Billing Info,  History,  Allergies,  Detailed Report    Orthostatic Vitals Recorded in This Encounter   07/29/2019  1132     Cuff Size: Large  All Notes   Progress Notes by Nunzio Cobbs, MD at 07/29/2019 11:30 AM Author: Nunzio Cobbs, MD Author Type: Physician Filed: 08/01/2019 10:39 PM  Note Status: Signed Cosign: Cosign Not Required Encounter Date: 07/29/2019  Editor: Nunzio Cobbs, MD (Physician)  Prior Versions: 1. Lowella Fairy, CMA (Certified Psychologist, sport and exercise) at 07/29/2019 11:53 AM - Sign when Signing Visit    GYNECOLOGY  VISIT   HPI: 48 y.o.   Married  Caucasian  female   [id number] with Patient's last menstrual period was 09/26/2015 (exact date).   here for surgical consult and discuss urodynamics. Her husband is present for the visit today.    The patient has a dx of large left ovarian cyst of 7 cm and a right ovarian nodule by pelvic US.  Her presenting symptom was abdominal pain in the [facility] ER on 05/08/19, and she has been followed as an outpatient.  Her CA125 was 7.0 on 05/14/19.  Her GC/CT were negative at her ER visit.    During the course of her outpatient care, she shared a history of urinary incontinence with cough, laugh, and sneeze.  She also reported urinary frequency and urgency. She requested assistance for this, and therefore underwent further evaluation.      Urodynamic testing (07/22/19): Uroflow - void 12 cc, not continuous.  PVR drops. CMG S1 7 cc, S2 61 cc, S3 153 cc.  Max capacity 262 cc.  VLPP 166 cm H2O at 262 cc.  UPP 18 cm H2O.  Pressure flow PDet max 44 cm H2O.  Voided 307 cc.    She currently reports left lower quadrant discomfort with rolling over in bed.    Is having hot flashes.    GYNECOLOGIC HISTORY: Patient's last menstrual period was 09/26/2015 (exact date). Contraception: Hysterectomy Menopausal hormone therapy:  n/a Last mammogram:  04-01-19 Diag.Bil w/US Neg/density C/biRads2 Last pap smear:  08/20/2015 normal.  Final pathology of cervix at hysterectomy - benign.                          OB History     Gravida  3   Para  3   Term  3   Preterm      AB      Living         SAB      TAB      Ectopic      Multiple      Live Births  Patient Active Problem List    Diagnosis Date Noted  . Left ovarian cyst 05/15/2019  . Right ovarian cyst 05/15/2019  . Vitamin D deficiency 12/14/2017  . 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 Genetic testing 06/20/2016  . Family history of breast cancer in female 05/12/2016  . Family history of colorectal cancer 05/12/2016  . Fatty liver 02/22/2016  . Abnormal mammogram of left breast 01/22/2016  . Breast cyst 11/21/2014  . Menorrhagia 11/21/2014  . Hypothyroidism 11/21/2014  . Obesity 11/21/2014  . Seasonal allergies 11/21/2014  . Asthma, chronic 11/21/2014  . Generalized anxiety disorder 11/21/2014          Past Medical History:  Diagnosis Date  . Abnormal mammogram of left breast 01/22/2016  . Anemia 11/21/2014    -with elevated platelets   . Anxiety    . Asthma    . Atypical nevus 10/10/2016    Left Inner Thigh - Mild  . Breast cyst 11/21/2014    -L breast, upper L just off from center -stable per radiology 10/2014   . Constipation    . Dyspnea    . Fatty liver    . Gallbladder problem    . Gallstone pancreatitis 03/14/2016  .  Gastritis    . Generalized anxiety disorder 11/21/2014  . GERD (gastroesophageal reflux disease)    . Hiatal hernia    . Hypothyroidism    . IBS (irritable bowel syndrome)    . Menorrhagia 11/21/2014    -seeing gyn    . Obesity 11/21/2014  . Ovarian cyst      Bilateral  . Plantar fasciitis of left foot 11/21/2014  . Seasonal allergies 11/21/2014  . 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 Thyroid disease             Past Surgical History:  Procedure Laterality Date  . ABDOMINAL HYSTERECTOMY      . BREAST BIOPSY      . BREAST EXCISIONAL BIOPSY      . BREAST LUMPECTOMY WITH RADIOACTIVE SEED LOCALIZATION Left 01/22/2016    Procedure: LEFT BREAST LUMPECTOMY WITH RADIOACTIVE SEED LOCALIZATION;  Surgeon: Fanny Skates, MD;  Location: [place];  Service: General;  Laterality: Left;  . CESAREAN SECTION        x3  . CHOLECYSTECTOMY N/A 03/15/2016    Procedure: LAPAROSCOPIC CHOLECYSTECTOMY WITH INTRAOPERATIVE CHOLANGIOGRAM;  Surgeon: Jackolyn Confer, MD;  Location: [facility];  Service: General;  Laterality: N/A;  . COLONOSCOPY   06/18/2019  . CYSTO N/A 10/06/2015    Procedure: CYSTOSCOPY;  Surgeon: Nunzio Cobbs, MD;  Location: [facility];  Service: Gynecology;  Laterality: N/A;  . TUBAL LIGATION      . UPPER GASTROINTESTINAL ENDOSCOPY   06/18/2019            Current Outpatient Medications  Medication Sig Dispense Refill  . albuterol (PROVENTIL) (2.5 MG/3ML) 0.083% nebulizer solution Take 3 mLs (2.5 mg total) by nebulization every 6 (six) hours as needed for wheezing or shortness of breath. 75 mL 0  . albuterol (VENTOLIN HFA) 108 (90 Base) MCG/ACT inhaler INHALE 1 PUFF INTO THE LUNGS EVERY 6 (SIX) HOURS AS NEEDED FOR WHEEZING OR SHORTNESS OF BREATH. 18 g 0  . cholestyramine (QUESTRAN) 4 g packet Take 1 packet (4 g total) by mouth 2 (two) times daily. 60 each 5  . levothyroxine (SYNTHROID) 88 MCG tablet TAKE 1 TABLET BY MOUTH  DAILY. (Patient taking differently: Take 88 mcg by mouth daily. )  90 tablet 1  . liothyronine (CYTOMEL) 5 MCG tablet Take 2 tablets (10 mcg total) by mouth daily. 60 tablet 2  . loratadine (CLARITIN) 10 MG tablet Take 1 tablet (10 mg total) by mouth daily. 90 tablet 0  . norethindrone (MICRONOR) 0.35 MG tablet Take 1 tablet (0.35 mg total) by mouth daily. 3 Package 0  . venlafaxine XR (EFFEXOR-XR) 150 MG 24 hr capsule TAKE 1 CAPSULE BY MOUTH ONCE DAILY WITH FOOD (MUST MAKE APPT) 30 capsule 0    No current facility-administered medications for this visit.      ALLERGIES: Patient has no known allergies.        Family History  Problem Relation Age of Onset  . Breast cancer Mother 63        s/p lump and rad  . Skin cancer Mother          non-melanoma  . Thyroid disease Mother    . Skin cancer Father          non-melanoma  . Hypertension Father    . Hyperlipidemia Father    . Breast cancer Paternal Grandmother          dx 81s; s/p mastectomy  . Cervical cancer Maternal Grandmother    . Arthritis Maternal Grandmother    . CVA Maternal Grandmother    . Stroke Maternal Grandmother 80  . Heart attack Maternal Grandfather          d. 60s  . Colon cancer Paternal Grandfather 52  . Rectal cancer Maternal Uncle 54  . Aortic aneurysm Maternal Uncle 53  . Uterine cancer Other          maternal great grandmother (MGM's mother)  . Stroke Other    . Cancer Other          maternal great grandfather (MGM's father); NOS cancer  . Breast cancer Other          maternal great grandmother (MGF's mother)  . Colon cancer Other          maternal great grandfather (MGF's father)  . Heart attack Other    . Colon cancer Other          paternal great grandfather (PGF's father)  . Heart attack Other    . Stomach cancer Neg Hx    . Pancreatic cancer Neg Hx    . Esophageal cancer Neg Hx        Social History         Socioeconomic History  . Marital status: Married      Spouse name: Elberta Fortis  Schweitzer  . Number of children: 3  . Years of education: Not on file  . Highest education level: Not on file  Occupational History  . Occupation: Government social research officer  Tobacco Use  . Smoking status: Former Smoker      Types: Cigarettes  . Smokeless tobacco: Never Used  Substance and Sexual Activity  . Alcohol use: Yes      Alcohol/week: 1.0 standard drinks      Types: 1 Glasses of wine per week      Comment: socially  . Drug use: No  . Sexual activity: Yes      Partners: Male      Birth control/protection: Surgical      Comment: Tubal  Other Topics Concern  . Not on file  Social History Narrative  . Not on file    Social Determinants of Health       Financial Resource  Strain:   . Difficulty of Paying Living Expenses: Not on file  Food Insecurity:   . Worried About Charity fundraiser in the Last Year: Not on file  . Ran Out of Food in the Last Year: Not on file  Transportation Needs:   . Lack of Transportation (Medical): Not on file  . Lack of Transportation (Non-Medical): Not on file  Physical Activity:   . Days of Exercise per Week: Not on file  . Minutes of Exercise per Session: Not on file  Stress:   . Feeling of Stress : Not on file  Social Connections:   . Frequency of Communication with Friends and Family: Not on file  . Frequency of Social Gatherings with Friends and Family: Not on file  . Attends Religious Services: Not on file  . Active Member of Clubs or Organizations: Not on file  . Attends Archivist Meetings: Not on file  . Marital Status: Not on file  Intimate Partner Violence:   . Fear of Current or Ex-Partner: Not on file  . Emotionally Abused: Not on file  . Physically Abused: Not on file  . Sexually Abused: Not on file      Review of Systems  All other systems reviewed and are negative.     PHYSICAL EXAMINATION:     BP 120/82 (Cuff Size: Large)   Pulse 70   Temp (!) 97.3 F (36.3 C) (Temporal)   Ht 4' 11.75" (1.518 m)    Wt 218 lb (98.9 kg)   LMP 09/26/2015 (Exact Date)   BMI 42.93 kg/m     General appearance: alert, cooperative and appears stated age Head: Normocephalic, without obvious abnormality, atraumatic Lungs: clear to auscultation bilaterally Heart: regular rate and rhythm Abdomen: soft, non-tender, no masses,  no organomegaly Extremities: extremities normal, atraumatic, no cyanosis or edema Skin: Skin color, texture, turgor normal. No rashes or lesions No abnormal inguinal nodes palpated Neurologic: Grossly normal   Pelvic: External genitalia:  no lesions              Urethra:  normal appearing urethra with no masses, tenderness or lesions              Bartholins and Skenes: normal                 Vagina: normal appearing vagina with normal color and discharge, no lesions              Cervix: absent.                Bimanual Exam:  Uterus: absent              Adnexa: no mass, fullness, tenderness              Rectal exam: Yes.  .  Confirms.              Anus:  normal sphincter tone, no lesions   Chaperone was present for exam.   ASSESSMENT   Large left ovarian cyst.  Right ovarian nodule. Genuine stress incontinence. Low bladder capacity.    PLAN   We reviewed laparoscopic left oophorectomy and possible right oophorectomy.  Risks, benefits, and alternatives reviewed.  Discussion of surgical menopause with removal of both ovaries and potential estrogen and testosterone treatment if needed.    Extensive discussion regarding urodynamic testing and genuine stress incontinence risk factors and options for care - midurethral sling, pelvic floor therapy, and weight loss. Midurethral sling reviewed in  detail - permanent nature of the mesh material reviewed, efficacy 85-90% and potential specific risks including cystotomy, urinary retention, mesh erosion/exposure requiring surgical care to treat it, urinary tract infection, urinary urgency, and slower voiding.    We discussed general  principles of surgical care and balancing the risks of the nonemergent but indicated procedures with the benefits of proceeding forward and improving life quality.   The patient will consider her options and make a final decision regarding proceeding forward surgically.    She understands she will need preop Covid 19 testing prior to surgery.    An After Visit Summary was printed and given to the patient.   __40____ minutes face to face time of which over 50% was spent in counseling.        Addendum:  Patient has declined surgical care for her stress urinary incontinence.  The TVT exact will therefore not be performed at the time of her laparoscopic surgery.

## 2019-08-10 ENCOUNTER — Other Ambulatory Visit (HOSPITAL_COMMUNITY)
Admission: RE | Admit: 2019-08-10 | Discharge: 2019-08-10 | Disposition: A | Discharge status: Home / Self Care | Payer: No Typology Code available for payment source | Source: Ambulatory Visit | Attending: Obstetrics and Gynecology | Admitting: Obstetrics and Gynecology

## 2019-08-10 DIAGNOSIS — Z01812 Encounter for preprocedural laboratory examination: Secondary | ICD-10-CM | POA: Diagnosis present

## 2019-08-10 DIAGNOSIS — Z20828 Contact with and (suspected) exposure to other viral communicable diseases: Secondary | ICD-10-CM | POA: Diagnosis not present

## 2019-08-10 LAB — SARS CORONAVIRUS 2 (TAT 6-24 HRS): SARS Coronavirus 2: NEGATIVE

## 2019-08-13 ENCOUNTER — Ambulatory Visit (HOSPITAL_BASED_OUTPATIENT_CLINIC_OR_DEPARTMENT_OTHER): Payer: No Typology Code available for payment source | Admitting: Anesthesiology

## 2019-08-13 ENCOUNTER — Encounter (HOSPITAL_BASED_OUTPATIENT_CLINIC_OR_DEPARTMENT_OTHER)
Admission: RE | Disposition: A | Discharge status: Home / Self Care | Payer: Self-pay | Source: Home / Self Care | Attending: Obstetrics and Gynecology

## 2019-08-13 ENCOUNTER — Other Ambulatory Visit: Payer: Self-pay

## 2019-08-13 ENCOUNTER — Encounter (HOSPITAL_BASED_OUTPATIENT_CLINIC_OR_DEPARTMENT_OTHER): Payer: Self-pay | Admitting: Obstetrics and Gynecology

## 2019-08-13 ENCOUNTER — Ambulatory Visit (HOSPITAL_BASED_OUTPATIENT_CLINIC_OR_DEPARTMENT_OTHER)
Admission: RE | Admit: 2019-08-13 | Discharge: 2019-08-13 | Disposition: A | Discharge status: Home / Self Care | Payer: No Typology Code available for payment source | Attending: Obstetrics and Gynecology | Admitting: Obstetrics and Gynecology

## 2019-08-13 DIAGNOSIS — D270 Benign neoplasm of right ovary: Secondary | ICD-10-CM | POA: Diagnosis not present

## 2019-08-13 DIAGNOSIS — Z6841 Body Mass Index (BMI) 40.0 and over, adult: Secondary | ICD-10-CM | POA: Insufficient documentation

## 2019-08-13 DIAGNOSIS — E039 Hypothyroidism, unspecified: Secondary | ICD-10-CM | POA: Insufficient documentation

## 2019-08-13 DIAGNOSIS — K589 Irritable bowel syndrome without diarrhea: Secondary | ICD-10-CM | POA: Diagnosis not present

## 2019-08-13 DIAGNOSIS — D271 Benign neoplasm of left ovary: Secondary | ICD-10-CM | POA: Insufficient documentation

## 2019-08-13 DIAGNOSIS — Z87891 Personal history of nicotine dependence: Secondary | ICD-10-CM | POA: Insufficient documentation

## 2019-08-13 DIAGNOSIS — K219 Gastro-esophageal reflux disease without esophagitis: Secondary | ICD-10-CM | POA: Diagnosis not present

## 2019-08-13 DIAGNOSIS — E559 Vitamin D deficiency, unspecified: Secondary | ICD-10-CM | POA: Insufficient documentation

## 2019-08-13 DIAGNOSIS — Z79899 Other long term (current) drug therapy: Secondary | ICD-10-CM | POA: Diagnosis not present

## 2019-08-13 DIAGNOSIS — F411 Generalized anxiety disorder: Secondary | ICD-10-CM | POA: Diagnosis not present

## 2019-08-13 DIAGNOSIS — N393 Stress incontinence (female) (male): Secondary | ICD-10-CM | POA: Insufficient documentation

## 2019-08-13 DIAGNOSIS — Z79890 Hormone replacement therapy (postmenopausal): Secondary | ICD-10-CM | POA: Diagnosis not present

## 2019-08-13 DIAGNOSIS — J45998 Other asthma: Secondary | ICD-10-CM | POA: Diagnosis not present

## 2019-08-13 DIAGNOSIS — N83291 Other ovarian cyst, right side: Secondary | ICD-10-CM | POA: Diagnosis not present

## 2019-08-13 DIAGNOSIS — K660 Peritoneal adhesions (postprocedural) (postinfection): Secondary | ICD-10-CM | POA: Diagnosis not present

## 2019-08-13 DIAGNOSIS — N83202 Unspecified ovarian cyst, left side: Secondary | ICD-10-CM | POA: Diagnosis not present

## 2019-08-13 DIAGNOSIS — N736 Female pelvic peritoneal adhesions (postinfective): Secondary | ICD-10-CM | POA: Diagnosis not present

## 2019-08-13 HISTORY — DX: Presence of spectacles and contact lenses: Z97.3

## 2019-08-13 HISTORY — DX: Personal history of diseases of the blood and blood-forming organs and certain disorders involving the immune mechanism: Z86.2

## 2019-08-13 HISTORY — DX: Personal history of other diseases of the digestive system: Z87.19

## 2019-08-13 LAB — CBC
HCT: 44 % (ref 36.0–46.0)
Hemoglobin: 14.8 g/dL (ref 12.0–15.0)
MCH: 29.8 pg (ref 26.0–34.0)
MCHC: 33.6 g/dL (ref 30.0–36.0)
MCV: 88.5 fL (ref 80.0–100.0)
Platelets: 360 10*3/uL (ref 150–400)
RBC: 4.97 MIL/uL (ref 3.87–5.11)
RDW: 12.7 % (ref 11.5–15.5)
WBC: 7.4 10*3/uL (ref 4.0–10.5)
nRBC: 0 % (ref 0.0–0.2)

## 2019-08-13 LAB — ABO/RH: ABO/RH(D): O POS

## 2019-08-13 LAB — BASIC METABOLIC PANEL
Anion gap: 13 (ref 5–15)
BUN: 15 mg/dL (ref 6–20)
CO2: 20 mmol/L — ABNORMAL LOW (ref 22–32)
Calcium: 9.2 mg/dL (ref 8.9–10.3)
Chloride: 106 mmol/L (ref 98–111)
Creatinine, Ser: 0.83 mg/dL (ref 0.44–1.00)
GFR calc Af Amer: >60 mL/min (ref >60)
GFR calc non Af Amer: >60 mL/min (ref >60)
Glucose, Bld: 106 mg/dL — ABNORMAL HIGH (ref 70–99)
Potassium: 3.4 mmol/L — ABNORMAL LOW (ref 3.5–5.1)
Sodium: 139 mmol/L (ref 135–145)

## 2019-08-13 LAB — TYPE AND SCREEN
ABO/RH(D): O POS
Antibody Screen: NEGATIVE

## 2019-08-13 SURGERY — OOPHORECTOMY, LAPAROSCOPIC
Anesthesia: General | Site: Abdomen | Laterality: Bilateral

## 2019-08-13 MED ORDER — LABETALOL HCL 5 MG/ML IV SOLN
INTRAVENOUS | Status: DC | PRN
  Administered 2019-08-13 (×2): 5 mg via INTRAVENOUS

## 2019-08-13 MED ORDER — IBUPROFEN 800 MG PO TABS: 800.0000 mg | ORAL_TABLET | Freq: Three times a day (TID) | ORAL | 1 refills | Status: DC | PRN

## 2019-08-13 MED ORDER — FENTANYL CITRATE (PF) 100 MCG/2ML IJ SOLN
INTRAMUSCULAR | Status: AC
  Filled 2019-08-13: qty 2

## 2019-08-13 MED ORDER — MIDAZOLAM HCL 2 MG/2ML IJ SOLN
INTRAMUSCULAR | Status: DC | PRN
  Administered 2019-08-13 (×2): 1 mg via INTRAVENOUS

## 2019-08-13 MED ORDER — LABETALOL HCL 5 MG/ML IV SOLN
INTRAVENOUS | Status: AC
  Filled 2019-08-13: qty 4

## 2019-08-13 MED ORDER — HYDROMORPHONE HCL 1 MG/ML IJ SOLN
0.2500 mg | INTRAMUSCULAR | Status: DC | PRN
  Administered 2019-08-13 (×2): 0.5 mg via INTRAVENOUS
  Filled 2019-08-13: qty 0.5

## 2019-08-13 MED ORDER — LIDOCAINE 2% (20 MG/ML) 5 ML SYRINGE
INTRAMUSCULAR | Status: AC
  Filled 2019-08-13: qty 5

## 2019-08-13 MED ORDER — KETAMINE HCL 10 MG/ML IJ SOLN
INTRAMUSCULAR | Status: AC
  Filled 2019-08-13: qty 1

## 2019-08-13 MED ORDER — KETOROLAC TROMETHAMINE 30 MG/ML IJ SOLN
INTRAMUSCULAR | Status: AC
  Filled 2019-08-13: qty 1

## 2019-08-13 MED ORDER — MEPERIDINE HCL 25 MG/ML IJ SOLN
6.2500 mg | INTRAMUSCULAR | Status: DC | PRN
  Filled 2019-08-13: qty 1

## 2019-08-13 MED ORDER — KETOROLAC TROMETHAMINE 30 MG/ML IJ SOLN
INTRAMUSCULAR | Status: DC | PRN
  Administered 2019-08-13: 30 mg via INTRAVENOUS

## 2019-08-13 MED ORDER — PHENYLEPHRINE 40 MCG/ML (10ML) SYRINGE FOR IV PUSH (FOR BLOOD PRESSURE SUPPORT)
PREFILLED_SYRINGE | INTRAVENOUS | Status: AC
  Filled 2019-08-13: qty 10

## 2019-08-13 MED ORDER — ROCURONIUM BROMIDE 10 MG/ML (PF) SYRINGE
PREFILLED_SYRINGE | INTRAVENOUS | Status: DC | PRN
  Administered 2019-08-13: 10 mg via INTRAVENOUS
  Administered 2019-08-13: 20 mg via INTRAVENOUS
  Administered 2019-08-13 (×2): 10 mg via INTRAVENOUS
  Administered 2019-08-13: 50 mg via INTRAVENOUS

## 2019-08-13 MED ORDER — SCOPOLAMINE 1 MG/3DAYS TD PT72
1.0000 | MEDICATED_PATCH | TRANSDERMAL | Status: DC
  Administered 2019-08-13: 09:00:00 1.5 mg via TRANSDERMAL
  Filled 2019-08-13: qty 1

## 2019-08-13 MED ORDER — ACETAMINOPHEN 500 MG PO TABS
1000.0000 mg | ORAL_TABLET | Freq: Once | ORAL | Status: AC
  Administered 2019-08-13: 09:00:00 1000 mg via ORAL
  Filled 2019-08-13: qty 2

## 2019-08-13 MED ORDER — PROMETHAZINE HCL 25 MG/ML IJ SOLN
6.2500 mg | INTRAMUSCULAR | Status: DC | PRN
  Filled 2019-08-13: qty 1

## 2019-08-13 MED ORDER — OXYCODONE HCL 5 MG/5ML PO SOLN
5.0000 mg | Freq: Once | ORAL | Status: AC | PRN
  Filled 2019-08-13: qty 5

## 2019-08-13 MED ORDER — OXYCODONE-ACETAMINOPHEN 5-325 MG PO TABS: 1.0000 | ORAL_TABLET | ORAL | 0 refills | Status: DC | PRN

## 2019-08-13 MED ORDER — LIDOCAINE 2% (20 MG/ML) 5 ML SYRINGE
INTRAMUSCULAR | Status: DC | PRN
  Administered 2019-08-13: 1.5 mg/kg/h via INTRAVENOUS

## 2019-08-13 MED ORDER — PROPOFOL 10 MG/ML IV BOLUS
INTRAVENOUS | Status: AC
  Filled 2019-08-13: qty 20

## 2019-08-13 MED ORDER — MIDAZOLAM HCL 2 MG/2ML IJ SOLN
INTRAMUSCULAR | Status: AC
  Filled 2019-08-13: qty 2

## 2019-08-13 MED ORDER — PROPOFOL 10 MG/ML IV BOLUS
INTRAVENOUS | Status: DC | PRN
  Administered 2019-08-13: 200 mg via INTRAVENOUS
  Administered 2019-08-13: 150 ug/kg/min via INTRAVENOUS

## 2019-08-13 MED ORDER — LIDOCAINE 2% (20 MG/ML) 5 ML SYRINGE
INTRAMUSCULAR | Status: DC | PRN
  Administered 2019-08-13: 100 mg via INTRAVENOUS

## 2019-08-13 MED ORDER — HYDROMORPHONE HCL 1 MG/ML IJ SOLN
INTRAMUSCULAR | Status: AC
  Filled 2019-08-13: qty 1

## 2019-08-13 MED ORDER — BUPIVACAINE HCL (PF) 0.25 % IJ SOLN
INTRAMUSCULAR | Status: DC | PRN
  Administered 2019-08-13: 7 mL

## 2019-08-13 MED ORDER — METOCLOPRAMIDE HCL 5 MG/ML IJ SOLN
INTRAMUSCULAR | Status: AC
  Filled 2019-08-13: qty 2

## 2019-08-13 MED ORDER — ONDANSETRON HCL 4 MG/2ML IJ SOLN
INTRAMUSCULAR | Status: AC
  Filled 2019-08-13: qty 2

## 2019-08-13 MED ORDER — ACETAMINOPHEN 10 MG/ML IV SOLN
INTRAVENOUS | Status: AC
  Filled 2019-08-13: qty 100

## 2019-08-13 MED ORDER — FENTANYL CITRATE (PF) 100 MCG/2ML IJ SOLN
INTRAMUSCULAR | Status: DC | PRN
  Administered 2019-08-13: 50 ug via INTRAVENOUS
  Administered 2019-08-13: 100 ug via INTRAVENOUS
  Administered 2019-08-13 (×5): 50 ug via INTRAVENOUS

## 2019-08-13 MED ORDER — ROCURONIUM BROMIDE 10 MG/ML (PF) SYRINGE
PREFILLED_SYRINGE | INTRAVENOUS | Status: AC
  Filled 2019-08-13: qty 10

## 2019-08-13 MED ORDER — KETOROLAC TROMETHAMINE 30 MG/ML IJ SOLN
30.0000 mg | Freq: Once | INTRAMUSCULAR | Status: DC | PRN
  Filled 2019-08-13: qty 1

## 2019-08-13 MED ORDER — ACETAMINOPHEN 500 MG PO TABS
ORAL_TABLET | ORAL | Status: AC
  Filled 2019-08-13: qty 2

## 2019-08-13 MED ORDER — PHENYLEPHRINE 40 MCG/ML (10ML) SYRINGE FOR IV PUSH (FOR BLOOD PRESSURE SUPPORT)
PREFILLED_SYRINGE | INTRAVENOUS | Status: DC | PRN
  Administered 2019-08-13: 80 ug via INTRAVENOUS

## 2019-08-13 MED ORDER — LIDOCAINE 2% (20 MG/ML) 5 ML SYRINGE
INTRAMUSCULAR | Status: AC
  Filled 2019-08-13: qty 15

## 2019-08-13 MED ORDER — LACTATED RINGERS IV SOLN
INTRAVENOUS | Status: DC
  Filled 2019-08-13: qty 1000

## 2019-08-13 MED ORDER — OXYCODONE HCL 5 MG PO TABS
ORAL_TABLET | ORAL | Status: AC
  Filled 2019-08-13: qty 1

## 2019-08-13 MED ORDER — KETAMINE HCL 10 MG/ML IJ SOLN
INTRAMUSCULAR | Status: DC | PRN
  Administered 2019-08-13: 5 mg via INTRAVENOUS
  Administered 2019-08-13: 25 mg via INTRAVENOUS

## 2019-08-13 MED ORDER — SCOPOLAMINE 1 MG/3DAYS TD PT72
MEDICATED_PATCH | TRANSDERMAL | Status: AC
  Filled 2019-08-13: qty 1

## 2019-08-13 MED ORDER — ACETAMINOPHEN 10 MG/ML IV SOLN
1000.0000 mg | Freq: Once | INTRAVENOUS | Status: AC
  Administered 2019-08-13: 1000 mg via INTRAVENOUS
  Filled 2019-08-13: qty 100

## 2019-08-13 MED ORDER — DEXAMETHASONE SODIUM PHOSPHATE 10 MG/ML IJ SOLN
INTRAMUSCULAR | Status: DC | PRN
  Administered 2019-08-13: 10 mg via INTRAVENOUS

## 2019-08-13 MED ORDER — OXYCODONE HCL 5 MG PO TABS
5.0000 mg | ORAL_TABLET | Freq: Once | ORAL | Status: AC | PRN
  Administered 2019-08-13: 17:00:00 5 mg via ORAL
  Filled 2019-08-13: qty 1

## 2019-08-13 MED ORDER — METOCLOPRAMIDE HCL 5 MG/ML IJ SOLN
INTRAMUSCULAR | Status: DC | PRN
  Administered 2019-08-13: 10 mg via INTRAVENOUS

## 2019-08-13 MED ORDER — ONDANSETRON HCL 4 MG/2ML IJ SOLN
INTRAMUSCULAR | Status: DC | PRN
  Administered 2019-08-13 (×2): 4 mg via INTRAVENOUS

## 2019-08-13 MED ORDER — SUGAMMADEX SODIUM 200 MG/2ML IV SOLN
INTRAVENOUS | Status: DC | PRN
  Administered 2019-08-13: 200 mg via INTRAVENOUS

## 2019-08-13 MED ORDER — DEXAMETHASONE SODIUM PHOSPHATE 10 MG/ML IJ SOLN
INTRAMUSCULAR | Status: AC
  Filled 2019-08-13: qty 1

## 2019-08-13 MED FILL — OXYCODONE-APAP 5-325MG: 5-325 | 3 days supply | Qty: 20 | Fill #0

## 2019-08-13 MED FILL — IBUPROFEN 800 MG TAB: 800 | 10 days supply | Qty: 30 | Fill #0

## 2019-08-13 SURGICAL SUPPLY — 53 items
BAG RETRIEVAL 10MM (BASKET)
CABLE HIGH FREQUENCY MONO STRZ (ELECTRODE) ×3 IMPLANT
CANISTER SUCT 3000ML PPV (MISCELLANEOUS) ×3 IMPLANT
CONT SPEC 4OZ CLIKSEAL STRL BL (MISCELLANEOUS) ×3 IMPLANT
COVER SURGICAL LIGHT HANDLE (MISCELLANEOUS) ×3 IMPLANT
COVER WAND RF STERILE (DRAPES) ×3 IMPLANT
DERMABOND ADVANCED (GAUZE/BANDAGES/DRESSINGS) ×2
DERMABOND ADVANCED .7 DNX12 (GAUZE/BANDAGES/DRESSINGS) ×1 IMPLANT
DRSG COVADERM PLUS 2X2 (GAUZE/BANDAGES/DRESSINGS) IMPLANT
DRSG OPSITE POSTOP 3X4 (GAUZE/BANDAGES/DRESSINGS) IMPLANT
DURAPREP 26ML APPLICATOR (WOUND CARE) ×3 IMPLANT
GAUZE 4X4 16PLY RFD (DISPOSABLE) ×3 IMPLANT
GLOVE BIO SURGEON STRL SZ 6.5 (GLOVE) ×4 IMPLANT
GLOVE BIO SURGEONS STRL SZ 6.5 (GLOVE) ×2
GOWN STRL REUS W/TWL LRG LVL3 (GOWN DISPOSABLE) ×6 IMPLANT
GOWN STRL REUS W/TWL XL LVL3 (GOWN DISPOSABLE) ×3 IMPLANT
HOLDER FOLEY CATH W/STRAP (MISCELLANEOUS) ×3 IMPLANT
LIGASURE VESSEL 5MM BLUNT TIP (ELECTROSURGICAL) ×3 IMPLANT
NEEDLE INSUFFLATION 120MM (ENDOMECHANICALS) ×3 IMPLANT
NS IRRIG 1000ML POUR BTL (IV SOLUTION) ×3 IMPLANT
PACK LAPAROSCOPY BASIN (CUSTOM PROCEDURE TRAY) ×3 IMPLANT
PACK TRENDGUARD 450 HYBRID PRO (MISCELLANEOUS) ×1 IMPLANT
POUCH LAPAROSCOPIC INSTRUMENT (MISCELLANEOUS) ×6 IMPLANT
POUCH SPECIMEN RETRIEVAL 10MM (ENDOMECHANICALS) ×3 IMPLANT
PROTECTOR NERVE ULNAR (MISCELLANEOUS) ×6 IMPLANT
SCISSORS LAP 5X35 DISP (ENDOMECHANICALS) ×6 IMPLANT
SEPRAFILM MEMBRANE 5X6 (MISCELLANEOUS) ×6 IMPLANT
SET IRRIG TUBING LAPAROSCOPIC (IRRIGATION / IRRIGATOR) ×3 IMPLANT
SET IRRIG Y TYPE TUR BLADDER L (SET/KITS/TRAYS/PACK) ×3 IMPLANT
SET TUBE SMOKE EVAC HIGH FLOW (TUBING) ×3 IMPLANT
SHEARS HARMONIC ACE PLUS 36CM (ENDOMECHANICALS) ×3 IMPLANT
SLEEVE ADV FIXATION 5X100MM (TROCAR) ×3 IMPLANT
SLEEVE SURGEON STRL (DRAPES) ×6 IMPLANT
STOPCOCK 4 WAY LG BORE MALE ST (IV SETS) ×6 IMPLANT
SUT PLAIN 3 0 FS 2 27 (SUTURE) IMPLANT
SUT VIC AB 4-0 SH 27 (SUTURE) ×4
SUT VIC AB 4-0 SH 27XANBCTRL (SUTURE) ×2 IMPLANT
SUT VICRYL 0 UR6 27IN ABS (SUTURE) ×12 IMPLANT
SYR 50ML LL SCALE MARK (SYRINGE) ×9 IMPLANT
SYS BAG RETRIEVAL 10MM (BASKET)
SYSTEM BAG RETRIEVAL 10MM (BASKET) IMPLANT
SYSTEM CARTER THOMASON II (TROCAR) ×3 IMPLANT
TOWEL OR 17X26 10 PK STRL BLUE (TOWEL DISPOSABLE) ×3 IMPLANT
TRAP SPECIMEN MUCOUS 40CC (MISCELLANEOUS) ×3 IMPLANT
TRAY FOLEY W/BAG SLVR 14FR (SET/KITS/TRAYS/PACK) ×3 IMPLANT
TRENDGUARD 450 HYBRID PRO PACK (MISCELLANEOUS) ×3
TROCAR ADV FIXATION 5X100MM (TROCAR) ×3 IMPLANT
TROCAR BLADELESS OPT 5 100 (ENDOMECHANICALS) ×3 IMPLANT
TROCAR BLADELESS OPT 5 150 (ENDOMECHANICALS) ×3 IMPLANT
TROCAR XCEL NON-BLD 11X100MML (ENDOMECHANICALS) ×3 IMPLANT
TROCAR XCEL NON-BLD 5MMX100MML (ENDOMECHANICALS) ×3 IMPLANT
TUBING TUR DISP (UROLOGICAL SUPPLIES) ×3 IMPLANT
WARMER LAPAROSCOPE (MISCELLANEOUS) ×3 IMPLANT

## 2019-08-13 NOTE — Discharge Instructions (Signed)
DISCHARGE INSTRUCTIONS: Laparoscopy  The following instructions have been prepared to help you care for yourself upon your return home today.  Wound care:  Do not get the incision wet for the first 24 hours. The incision should be kept clean and dry.  The Band-Aids or dressings may be removed the day after surgery.  Should the incision become sore, red, and swollen after the first week, check with your doctor.  Personal hygiene:  Shower the day after your procedure.  Activity and limitations:  Do NOT drive or operate any equipment today.  Do NOT lift anything more than 15 pounds for 2-3 weeks after surgery.  Do NOT rest in bed all day.  Walking is encouraged. Walk each day, starting slowly with 5-minute walks 3 or 4 times a day. Slowly increase the length of your walks.  Walk up and down stairs slowly.  Do NOT do strenuous activities, such as golfing, playing tennis, bowling, running, biking, weight lifting, gardening, mowing, or vacuuming for 2-4 weeks. Ask your doctor when it is okay to start.  Diet: Eat a light meal as desired this evening. You may resume your usual diet tomorrow.  Return to work: This is dependent on the type of work you do. For the most part you can return to a desk job within a week of surgery. If you are more active at work, please discuss this with your doctor.  What to expect after your surgery: You may have a slight burning sensation when you urinate on the first day. You may have a very small amount of blood in the urine. Expect to have a small amount of vaginal discharge/light bleeding for 1-2 weeks. It is not unusual to have abdominal soreness and bruising for up to 2 weeks. You may be tired and need more rest for about 1 week. You may experience shoulder pain for 24-72 hours. Lying flat in bed may relieve it.  Call your doctor for any of the following:  Develop a fever of 100.4 or greater  Inability to urinate 6 hours after discharge from hospital  Severe  pain not relieved by pain medications  Persistent of heavy bleeding at incision site  Redness or swelling around incision site after a week  Increasing nausea or vomiting  Patient Signature________________________________________ Nurse Signature_________________________________________ Post Anesthesia Home Care Instructions  Activity: Get plenty of rest for the remainder of the day. A responsible adult should stay with you for 24 hours following the procedure.  For the next 24 hours, DO NOT: -Drive a car -Operate machinery -Drink alcoholic beverages -Take any medication unless instructed by your physician -Make any legal decisions or sign important papers.  Meals: Start with liquid foods such as gelatin or soup. Progress to regular foods as tolerated. Avoid greasy, spicy, heavy foods. If nausea and/or vomiting occur, drink only clear liquids until the nausea and/or vomiting subsides. Call your physician if vomiting continues.  Special Instructions/Symptoms: Your throat may feel dry or sore from the anesthesia or the breathing tube placed in your throat during surgery. If this causes discomfort, gargle with warm salt water. The discomfort should disappear within 24 hours.  If you had a scopolamine patch placed behind your ear for the management of post- operative nausea and/or vomiting:  1. The medication in the patch is effective for 72 hours, after which it should be removed.  Wrap patch in a tissue and discard in the trash. Wash hands thoroughly with soap and water. 2. You may remove the patch   earlier than 72 hours if you experience unpleasant side effects which may include dry mouth, dizziness or visual disturbances. 3. Avoid touching the patch. Wash your hands with soap and water after contact with the patch.   Maria [name],   Your surgery went well today.  I removed both ovaries and treated a lot of scar tissue.  I did a cystoscopy to look inside your bladder at  the end of the procedure, and your kidneys were working well.   Have a Happy New Year, and I will see you in the office in about 2 weeks.   Josefa Half, MD  Diagnostic Laparoscopy, Care After This sheet gives you information about how to care for yourself after your procedure. Your health care provider may also give you more specific instructions. If you have problems or questions, contact your health care provider. What can I expect after the procedure? After the procedure, it is common to have:  Mild discomfort in the abdomen.  Sore throat. Women who have laparoscopy with pelvic examination may have mild cramping and fluid coming from the vagina for a few days after the procedure. Follow these instructions at home: Medicines  Take over-the-counter and prescription medicines only as told by your health care provider.  If you were prescribed an antibiotic medicine, take it as told by your health care provider. Do not stop taking the antibiotic even if you start to feel better. Driving  Do not drive for 24 hours if you were given a medicine to help you relax (sedative) during your procedure.  Do not drive or use heavy machinery while taking prescription pain medicine. Bathing  Do not take baths, swim, or use a hot tub until your health care provider approves. You may take showers. Incision care   Follow instructions from your health care provider about how to take care of your incisions. Make sure you: ? Wash your hands with soap and water before you change your bandage (dressing). If soap and water are not available, use hand sanitizer. ? Change your dressing as told by your health care provider. ? Leave stitches (sutures), skin glue, or adhesive strips in place. These skin closures may need to stay in place for 2 weeks or longer. If adhesive strip edges start to loosen and curl up, you may trim the loose edges. Do not remove adhesive strips completely unless your health care  provider tells you to do that.  Check your incision areas every day for signs of infection. Check for: ? Redness, swelling, or pain. ? Fluid or blood. ? Warmth. ? Pus or a bad smell. Activity  Return to your normal activities as told by your health care provider. Ask your health care provider what activities are safe for you.  Do not lift anything that is heavier than 10 lb (4.5 kg), or the limit that you are told, until your health care provider says that it is safe. General instructions  To prevent or treat constipation while you are taking prescription pain medicine, your health care provider may recommend that you: ? Drink enough fluid to keep your urine pale yellow. ? Take over-the-counter or prescription medicines. ? Eat foods that are high in fiber, such as fresh fruits and vegetables, whole grains, and beans. ? Limit foods that are high in fat and processed sugars, such as fried and sweet foods.  Do not use any products that contain nicotine or tobacco, such as cigarettes and e-cigarettes. If you need help quitting, ask your health care  provider.  Keep all follow-up visits as told by your health care provider. This is important. Contact a health care provider if:  You develop shoulder pain.  You feel lightheaded or faint.  You are unable to pass gas or have a bowel movement.  You feel nauseous or you vomit.  You develop a rash.  You have redness, swelling, or pain around any incision.  You have fluid or blood coming from any incision.  Any incision feels warm to the touch.  You have pus or a bad smell coming from any incision.  You have a fever or chills. Get help right away if:  You have severe pain.  You have vomiting that does not go away.  You have heavy bleeding from the vagina.  Any incision opens.  You have trouble breathing.  You have chest pain. Summary  After the procedure, it is common to have mild discomfort in the abdomen and a sore  throat.  Check your incision areas every day for signs of infection.  Return to your normal activities as told by your health care provider. Ask your health care provider what activities are safe for you. This information is not intended to replace advice given to you by your health care provider. Make sure you discuss any questions you have with your health care provider. Document Released: 07/13/2015 Document Revised: 07/14/2017 Document Reviewed: 01/25/2017 Elsevier Patient Education  2020 [employer].

## 2019-08-13 NOTE — Progress Notes (Signed)
Update to History and Physical  No marked change in status since office pre-op visit.   Patient examined.   OK to proceed with surgery. 

## 2019-08-13 NOTE — Anesthesia Postprocedure Evaluation (Signed)
Anesthesia Post Note  Patient: Maria [name]  Procedure(s) Performed: LAPAROSCOPIC  BILATERAL OOPHORECTOMY (Bilateral Abdomen)     Patient location during evaluation: PACU Anesthesia Type: General Level of consciousness: awake and alert, oriented and patient cooperative Pain management: pain level controlled Vital Signs Assessment: post-procedure vital signs reviewed and stable Respiratory status: spontaneous breathing, nonlabored ventilation and respiratory function stable Cardiovascular status: blood pressure returned to baseline and stable Postop Assessment: no apparent nausea or vomiting Anesthetic complications: no    Last Vitals:  Vitals:   08/13/19 1430 08/13/19 1438  BP:  (!) 143/91  Pulse:  (!) 106  Resp: 17 18  Temp:  36.7 C  SpO2: 98% 99%    Last Pain:  Vitals:   08/13/19 0832  TempSrc: Oral  PainSc: 0-No pain                 Pervis Hocking

## 2019-08-13 NOTE — Anesthesia Procedure Notes (Addendum)
Procedure Name: Intubation Date/Time: 08/13/2019 9:42 AM Performed by: Mechele Claude, CRNA Pre-anesthesia Checklist: Patient identified, Emergency Drugs available, Suction available and Patient being monitored Patient Re-evaluated:Patient Re-evaluated prior to induction Oxygen Delivery Method: Circle system utilized Preoxygenation: Pre-oxygenation with 100% oxygen Induction Type: IV induction Ventilation: Mask ventilation without difficulty Laryngoscope Size: Mac and 3 Grade View: Grade II Tube type: Oral Tube size: 7.0 mm Number of attempts: 1 Airway Equipment and Method: Stylet and Oral airway Placement Confirmation: ETT inserted through vocal cords under direct vision,  positive ETCO2 and breath sounds checked- equal and bilateral Secured at: 19 cm Tube secured with: Tape Dental Injury: Teeth and Oropharynx as per pre-operative assessment

## 2019-08-13 NOTE — Anesthesia Preprocedure Evaluation (Signed)
Anesthesia Evaluation  Patient identified by MRN, date of birth, ID band Patient awake    Reviewed: Allergy & Precautions, NPO status , Patient's Chart, lab work & pertinent test results  Airway Mallampati: I  TM Distance: >3 FB Neck ROM: Full    Dental no notable dental hx. (+) Teeth Intact, Dental Advisory Given   Pulmonary asthma , Current Smoker,    Pulmonary exam normal        Cardiovascular Normal cardiovascular exam  Echo 2016: Normal LV function; grade 1 diastolic dysfunction; trace MR and TR.    Neuro/Psych PSYCHIATRIC DISORDERS Anxiety    GI/Hepatic hiatal hernia, IBS, hx pancreatitis 2/2 gallstones (2017)- resolved   Endo/Other  Hypothyroidism Morbid obesityBMI 42  Renal/GU    Stress urinary incontinence    Musculoskeletal negative musculoskeletal ROS (+)   Abdominal (+) + obese,   Peds  Hematology   Anesthesia Other Findings   Reproductive/Obstetrics Ovarian cyst Breast cyst s/p lumpectomy 2017                             Anesthesia Physical  Anesthesia Plan  ASA: III  Anesthesia Plan: General   Post-op Pain Management:    Induction: Intravenous  PONV Risk Score and Plan: 4 or greater and Ondansetron, Dexamethasone, Midazolam, Scopolamine patch - Pre-op and Treatment may vary due to age or medical condition  Airway Management Planned: Oral ETT  Additional Equipment: None  Intra-op Plan:   Post-operative Plan: Extubation in OR  Informed Consent: I have reviewed the patients History and Physical, chart, labs and discussed the procedure including the risks, benefits and alternatives for the proposed anesthesia with the patient or authorized representative who has indicated his/her understanding and acceptance.     Dental advisory given  Plan Discussed with: CRNA and Surgeon  Anesthesia Plan Comments:         Anesthesia Quick Evaluation

## 2019-08-13 NOTE — Transfer of Care (Signed)
  Last Vitals:  Vitals Value Taken Time  BP    Temp    Pulse 111 08/13/19 1435  Resp 13 08/13/19 1435  SpO2 98 % 08/13/19 1435  Vitals shown include unvalidated device data.  Last Pain:  Vitals:   08/13/19 0832  TempSrc: Oral  PainSc: 0-No pain      Patients Stated Pain Goal: 6 (08/13/19 [id number])  Immediate Anesthesia Transfer of Care Note  Patient: Maria [name]  Procedure(s) Performed: Procedure(s) (LRB): LAPAROSCOPIC  BILATERAL OOPHORECTOMY (Bilateral)  Patient Location: PACU  Anesthesia Type: General  Level of Consciousness: awake, alert  and oriented  Airway & Oxygen Therapy: Patient Spontanous Breathing and Patient connected to nasal cannula oxygen  Post-op Assessment: Report given to PACU RN and Post -op Vital signs reviewed and stable  Post vital signs: Reviewed and stable  Complications: No apparent anesthesia complications

## 2019-08-13 NOTE — Op Note (Signed)
OPERATIVE REPORT   PREOPERATIVE DIAGNOSES:   Left ovarian cyst, right ovarian nodule  POSTOPERATIVE DIAGNOSES:   Left ovarian cyst, right ovarian nodule, extensive pelvic and abdominal adhesions, cystoscopy.    PROCEDURES:   Laparoscopic bilateral oophorectomy with collection of pelvic washings, extensive lysis of adhesions, cystoscopy.  SURGEON:  Lenard Galloway, M.D.  ASSISTANT:   Lyman Speller, M.D.  ANESTHESIA:  General endotracheal, local with 0.5 % Marcaine.  EBL:  100 cc.   URINE OUTPUT:   350 cc.   IV FLUIDS:   2500 cc.   COMPLICATIONS:  None.  INDICATIONS FOR THE PROCEDURE:  The patient is a 48 year old female status post total abdominal hysterectomy who presented with abdominal pain and nausea to the emergency department and was diagnosed with a 7 cm left ovarian cyst and a right ovarian nodule.  She had no acute abdomen at the time.  She was followed as an outpatient and her ovarian findings were persistent on ultrasound.  She had a CA125 of 7.0.    A plan is made to proceed now with a laparoscopic left oophorectomy and possible right oophorectomy with collection of pelvic washings.  Risks, benefits, and alternatives are reviewed with the patient, who wishes to proceed.  FINDINGS:   Laparoscopy demonstrated an absent uterus and absent bilateral tubes.  The left ovary measured 10 cm and was partially retroperitoneal and covered by the sigmoid colon.  The right ovary was normal in size and covered by bowel as well. Both ovaries were adherent to the ipsilateral round ligaments.  She had extensive adhesions of bowel to the anterior abdominal wall, the bilateral ovaries, and the cul de sac. The liver and upper abdomen appeared to be normal.    The ureters were noted to peristalse along the bilateral pelvic sidewalls during at the end of the surgery.  Three hours of dissection with lysis of adhesions was performed prior to being able to complete the bilateral oophorectomy.     Cystoscopy at the termination of the procedure documented the bladder to be intact.  The trigone of the bladder demonstrated generalized erythema.  The ureters were patent bilaterally. The urethral was normal.   SPECIMENS:  Pelvic washing were sent to Pathology as one specimen, and the bilateral ovaries were sent as a second specimen.    DESCRIPTION OF PROCEDURE:  The patient was reidentified in the preoperative hold area.  She received TED hose and PAS stockings for DVT prophylaxis.  The patient was transferred to the operating room where she was placed in the dorsal lithotomy position with Allen stirrups.  General endotracheal anesthesia was induced.  The Trendgard was used to support the patient on the OR table.   An examination under anesthesia was performed.  The patient's lower abdomen, vagina and perineum were then sterilely prepped and she was draped.  A Foley catheter was sterilely placed inside the bladder and left to gravity drainage throughout the procedure and was removed at the termination of the procedure.  A sponge on a ring forceps was placed in the vagina for surgery, and was removed at the end of the case.  Attention was turned to the abdomen where the umbilical region was injected with 0.25% Marcaine and a small incision created.  Penetrating towel clips were used to raise the anterior abdominal wall.  A Veress needle was then placed and a saline drop test was performed.  The fluid flowed freely, but the intra abdominal pressure with CO2 applied was elevated.  A 5 mm camera port was then placed using the regular Optiview but the trocar did not penetrate the depth of the abdominal wall sufficiently.  A long Optiview trocar was also used, but the peritoneal cavity was not reached.  A decision was made to proceed with left upper trocar entry which was performed without difficulty using the Optiview 5 mm after injecting the skin with Marcaine and making the incision sharply with the  scalpel.  A 10 mm incision was created in the left lateral midabdomen and a 5 mm incision was created in the right lateral midabdomen after the skin was injected locally with 0.50% Marcaine. Respective trocars were then placed under visualization of the laparoscope.     The patient was placed in Trendelenburg position. An inspection of the abdomen and pelvis was performed. The findings are as noted above.   Pelvic washings were performed with the Nezhat suction irrigator and sent to Pathology.  The Harmonic scalpel and a laparoscopic scissors were used to take down the extensive bowel adhesions across the lower abdomen and around the bilateral ovaries.  This required 3 hours of dedicated time to expose the ovaries in order to perform the bilateral oophorectomy.  The bilateral ureters were dissected along each respective side wall prior to using the Ligasure device to cauterize and then cut the infundibulopelvic ligament pedicles respectively.   The bilateral ovaries were removed from the peritoneal cavity in an Endocatch bag and sent to Pathology together.  The pneumoperitoneum was decreased, and hemostasis was good.  Seprafilm slurry was mixed with 30 cc of normal saline and then placed in the pelvis.   The left lateral abdominal trocar was removed and the [employer] closure device was used the place sutures of 0/0 Vicryl to close the fascia.  The fascia was not adequately closing with this approach.  The skin incision was therefore enlarged in order to close the fascia with figure of 8 sutures of 0/0 Vicryl.  The pneumoperitoneam was released.  The remaining trocars were removed after the patient received manual breaths to remove any remaining CO2 gas.   The trocar sites were closed with subcuticular sutures of 4/0 Vicryl.  All incisions were then closed with Dermabond.    The sponge on a ring forceps was removed from the vagina, and the Foley catheter was removed.    Cystoscopy was  performed and the findings are above.   The patient was awakened, extubated, and escorted to the recovery room in stable condition.  There were no complications.  All needle, instrument, and sponge counts were correct.   Lenard Galloway, M.D.

## 2019-08-14 LAB — SURGICAL PATHOLOGY

## 2019-08-14 LAB — CYTOLOGY - NON PAP

## 2019-08-20 ENCOUNTER — Encounter: Payer: Self-pay | Admitting: Family Medicine

## 2019-08-20 ENCOUNTER — Ambulatory Visit: Payer: No Typology Code available for payment source | Admitting: Obstetrics and Gynecology

## 2019-08-26 ENCOUNTER — Ambulatory Visit (INDEPENDENT_AMBULATORY_CARE_PROVIDER_SITE_OTHER): Payer: No Typology Code available for payment source | Admitting: Obstetrics and Gynecology

## 2019-08-26 ENCOUNTER — Encounter: Payer: Self-pay | Admitting: Obstetrics and Gynecology

## 2019-08-26 ENCOUNTER — Other Ambulatory Visit: Payer: Self-pay

## 2019-08-26 VITALS — BP 126/78 | HR 80 | Temp 96.7°F | Ht 59.75 in | Wt 213.0 lb

## 2019-08-26 DIAGNOSIS — Z9889 Other specified postprocedural states: Secondary | ICD-10-CM

## 2019-08-26 MED ORDER — LEVOTHYROXINE SODIUM 88 MCG PO TABS: ORAL_TABLET | ORAL | 0 refills | Status: DC

## 2019-08-26 MED FILL — LIOTHYRONINE SODIUM 5 MCG T: 5 | 30 days supply | Qty: 60 | Fill #1

## 2019-08-26 MED FILL — ALBUTEROL SULFATE HFA 108 (: 108 (90 BAS | 25 days supply | Qty: 18 | Fill #0

## 2019-08-26 NOTE — Progress Notes (Signed)
GYNECOLOGY  VISIT   HPI: 49 y.o.   Married  Caucasian  female   G3P3000 with Patient's last menstrual period was 09/26/2015 (exact date).   here for 2 week follow up  [place] (Bilateral Abdomen). Pathology report showed:  Benign serous cystadenoma and benign hemorrhagic cyst.  Patient had extensive lysis of adhesions.   Fatigued.  No dizziness.   She has had night sweats that preceded surgery.  No increase in her hot flashes.   Wake all night and having headaches starting soon after discharge from the hospital. Three extra strength Tylenol help.  No hx of menstrual headaches.   Not using Oxycodone.   GYNECOLOGIC HISTORY: Patient's last menstrual period was 09/26/2015 (exact date). Contraception: Hyst Menopausal hormone therapy: None Last mammogram:04-01-19 Diag.Bil w/US Neg/density C/biRads2 Last pap smear: 08/20/2015 normal. Final pathology of cervix at hysterectomy - benign.         OB History    Gravida  3   Para  3   Term  3   Preterm      AB      Living        SAB      TAB      Ectopic      Multiple      Live Births                 Patient Active Problem List   Diagnosis Date Noted  . Left ovarian cyst 05/15/2019  . Right ovarian cyst 05/15/2019  . Vitamin D deficiency 12/14/2017  . 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 Genetic testing 06/20/2016  . Family history of breast cancer in female 05/12/2016  . Family history of colorectal cancer 05/12/2016  . Fatty liver 02/22/2016  . Abnormal mammogram of left breast 01/22/2016  . Breast cyst 11/21/2014  . Menorrhagia 11/21/2014  . Hypothyroidism 11/21/2014  . Obesity 11/21/2014  . Seasonal allergies 11/21/2014  . Asthma, chronic 11/21/2014  . Generalized anxiety disorder 11/21/2014    Past Medical History:  Diagnosis Date  . Asthma    followed by pcp  . Generalized anxiety disorder   . Hiatal hernia   . History of anemia   . History of pancreatitis     2017 due to gallstones-- resolved  . Hypothyroidism    endocrinologist-- dr Dwyane Dee  . IBS (irritable bowel syndrome)   . Ovarian cyst    Bilateral  . Seasonal allergies   . Wears glasses     Past Surgical History:  Procedure Laterality Date  . ABDOMINAL HYSTERECTOMY  2017  . BREAST LUMPECTOMY WITH RADIOACTIVE SEED LOCALIZATION Left 01/22/2016   Procedure: LEFT BREAST LUMPECTOMY WITH RADIOACTIVE SEED LOCALIZATION;  Surgeon: Fanny Skates, MD;  Location: [facility];  Service: General;  Laterality: Left;  . CESAREAN SECTION  x3  last one 2005   BILATERAL TUBAL LIGATION WITH LAST C/S  . CHOLECYSTECTOMY N/A 03/15/2016   Procedure: LAPAROSCOPIC CHOLECYSTECTOMY WITH INTRAOPERATIVE CHOLANGIOGRAM;  Surgeon: Jackolyn Confer, MD;  Location: [facility];  Service: General;  Laterality: N/A;  . COLONOSCOPY WITH ESOPHAGOGASTRODUODENOSCOPY (EGD)  06/18/2019  . CYSTO N/A 10/06/2015   Procedure: CYSTOSCOPY;  Surgeon: Nunzio Cobbs, MD;  Location: [facility];  Service: Gynecology;  Laterality: N/A;    Current Outpatient Medications  Medication Sig Dispense Refill  . albuterol (PROVENTIL) (2.5 MG/3ML) 0.083% nebulizer solution Take 3 mLs (2.5 mg total) by nebulization every 6 (six) hours as needed for wheezing or shortness of breath. 75 mL  0  . albuterol (VENTOLIN HFA) 108 (90 Base) MCG/ACT inhaler INHALE 1 PUFF INTO THE LUNGS EVERY 6 (SIX) HOURS AS NEEDED FOR WHEEZING OR SHORTNESS OF BREATH. 18 g 0  . ibuprofen (ADVIL) 800 MG tablet Take 1 tablet (800 mg total) by mouth every 8 (eight) hours as needed. 30 tablet 1  . levothyroxine (SYNTHROID) 88 MCG tablet Take 1 tablet by mouth once daily. **Must make appt for further refills.** 30 tablet 0  . liothyronine (CYTOMEL) 5 MCG tablet Take 2 tablets (10 mcg total) by mouth daily. 60 tablet 2  . loratadine (CLARITIN) 10 MG tablet Take 1 tablet (10 mg total) by mouth daily. 90 tablet 0  . venlafaxine XR (EFFEXOR-XR) 150 MG 24 hr capsule TAKE 1  CAPSULE BY MOUTH ONCE DAILY WITH FOOD (MUST MAKE APPT) (Patient taking differently: Take 150 mg by mouth daily. TAKE 1 CAPSULE BY MOUTH ONCE DAILY WITH FOOD (MUST MAKE APPT)) 30 capsule 0   No current facility-administered medications for this visit.     ALLERGIES: Patient has no known allergies.  Family History  Problem Relation Age of Onset  . Breast cancer Mother 28       s/p lump and rad  . Skin cancer Mother        non-melanoma  . Thyroid disease Mother   . Skin cancer Father        non-melanoma  . Hypertension Father   . Hyperlipidemia Father   . Breast cancer Paternal Grandmother        dx 51s; s/p mastectomy  . Cervical cancer Maternal Grandmother   . Arthritis Maternal Grandmother   . CVA Maternal Grandmother   . Stroke Maternal Grandmother 80  . Heart attack Maternal Grandfather        d. 20s  . Colon cancer Paternal Grandfather 54  . Rectal cancer Maternal Uncle 54  . Aortic aneurysm Maternal Uncle 53  . Uterine cancer Other        maternal great grandmother (MGM's mother)  . Stroke Other   . Cancer Other        maternal great grandfather (MGM's father); NOS cancer  . Breast cancer Other        maternal great grandmother (MGF's mother)  . Colon cancer Other        maternal great grandfather (MGF's father)  . Heart attack Other   . Colon cancer Other        paternal great grandfather (PGF's father)  . Heart attack Other   . Stomach cancer Neg Hx   . Pancreatic cancer Neg Hx   . Esophageal cancer Neg Hx     Social History   Socioeconomic History  . Marital status: Married    Spouse name: Elberta Fortis Speiser  . Number of children: 3  . Years of education: Not on file  . Highest education level: Not on file  Occupational History  . Occupation: Government social research officer  Tobacco Use  . Smoking status: Current Some Day Smoker    Years: 25.00    Types: Cigarettes  . Smokeless tobacco: Never Used  . Tobacco comment: 08-07-2019 social  Substance and Sexual  Activity  . Alcohol use: Yes    Comment: socially  . Drug use: Never  . Sexual activity: Yes    Partners: Male    Birth control/protection: Surgical    Comment: Tubal  Other Topics Concern  . Not on file  Social History Narrative  . Not on file   Social  Determinants of Health   Financial Resource Strain:   . Difficulty of Paying Living Expenses: Not on file  Food Insecurity:   . Worried About Charity fundraiser in the Last Year: Not on file  . Ran Out of Food in the Last Year: Not on file  Transportation Needs:   . Lack of Transportation (Medical): Not on file  . Lack of Transportation (Non-Medical): Not on file  Physical Activity:   . Days of Exercise per Week: Not on file  . Minutes of Exercise per Session: Not on file  Stress:   . Feeling of Stress : Not on file  Social Connections:   . Frequency of Communication with Friends and Family: Not on file  . Frequency of Social Gatherings with Friends and Family: Not on file  . Attends Religious Services: Not on file  . Active Member of Clubs or Organizations: Not on file  . Attends Archivist Meetings: Not on file  . Marital Status: Not on file  Intimate Partner Violence:   . Fear of Current or Ex-Partner: Not on file  . Emotionally Abused: Not on file  . Physically Abused: Not on file  . Sexually Abused: Not on file    Review of Systems  All other systems reviewed and are negative.   PHYSICAL EXAMINATION:    BP 126/78 (Cuff Size: Large)   Pulse 80   Temp (!) 96.7 F (35.9 C) (Temporal)   Ht 4' 11.75" (1.518 m)   Wt 213 lb (96.6 kg)   LMP 09/26/2015 (Exact Date)   BMI 41.95 kg/m     General appearance: alert, cooperative and appears stated age   Abdomen: incisions intact,  Abdomen is soft, non-tender, no masses,  no organomegaly  Pelvic: External genitalia:  no lesions            Bimanual Exam:  Uterus:  absent              Adnexa: no mass, fullness, generalized mild tenderness          Chaperone was present for exam.  ASSESSMENT  Status post laparoscopic BSO and extensive LOA and cystoscopy for bilateral ovarian cysts. Headaches post op.  Menopausal symptoms.  PLAN  We discussed her surgical findings, procedure, and pathology report.  We discussed transdermal estrogen for treatment of her menopausal symptoms and perhaps her headaches. I did educate her about increased risk of stroke, DVT and PE with use.  We reviewed Effexor as a treatment for vasomotor symptoms.  Return to sexual activity and physical activity 6 weeks post op.  Fu prn.   An After Visit Summary was printed and given to the patient.

## 2019-08-27 ENCOUNTER — Telehealth (INDEPENDENT_AMBULATORY_CARE_PROVIDER_SITE_OTHER): Payer: No Typology Code available for payment source | Admitting: Family Medicine

## 2019-08-27 ENCOUNTER — Encounter: Payer: Self-pay | Admitting: Family Medicine

## 2019-08-27 DIAGNOSIS — E559 Vitamin D deficiency, unspecified: Secondary | ICD-10-CM | POA: Diagnosis not present

## 2019-08-27 DIAGNOSIS — F411 Generalized anxiety disorder: Secondary | ICD-10-CM | POA: Diagnosis not present

## 2019-08-27 DIAGNOSIS — E038 Other specified hypothyroidism: Secondary | ICD-10-CM | POA: Diagnosis not present

## 2019-08-27 DIAGNOSIS — J4520 Mild intermittent asthma, uncomplicated: Secondary | ICD-10-CM | POA: Diagnosis not present

## 2019-08-27 DIAGNOSIS — E669 Obesity, unspecified: Secondary | ICD-10-CM

## 2019-08-27 DIAGNOSIS — E785 Hyperlipidemia, unspecified: Secondary | ICD-10-CM

## 2019-08-27 MED ORDER — ALBUTEROL SULFATE HFA 108 (90 BASE) MCG/ACT IN AERS: INHALATION_SPRAY | RESPIRATORY_TRACT | 0 refills | Status: DC

## 2019-08-27 MED ORDER — VENLAFAXINE HCL ER 150 MG PO CP24: ORAL_CAPSULE | ORAL | 1 refills | Status: DC

## 2019-08-27 NOTE — Progress Notes (Signed)
Virtual Visit via Video Note  I connected with Maria [name]  on 08/27/19 at 10:00 AM EST by a video enabled telemedicine application and verified that I am speaking with the correct person using two identifiers.  Location patient: home Location provider:work or home office Persons participating in the virtual visit: patient, provider  I discussed the limitations of evaluation and management by telemedicine and the availability of in person appointments. The patient expressed understanding and agreed to proceed.   HPI:  Follow up chronic disease for refills. Reports doing ok. Recovering from oophorectomy - benign. Seeing [name] for the thyroid checks. Requests refills on effexor and alb. Reports both are stable for the most part. GAD: -meds: effexor -when tries   Seasonal allergies/mild int asthma: -meds: loratadine, prn alb -requests refill on albuterol - reports uses with allergies, also with extended mask use  Obesity/Insulin resistance/Hyperlipidemia: -used to see [name], no longer seeing -trying to eat healthy and get exercise when able  Hypothyroidism: -meds: cytomel, synthroid -sees [name]  Low Vit D: -meds: taking Vit D    ROS: See pertinent positives and negatives per HPI.  Past Medical History:  Diagnosis Date  . Asthma    followed by pcp  . Generalized anxiety disorder   . Hiatal hernia   . History of anemia   . History of pancreatitis    2017 due to gallstones-- resolved  . Hypothyroidism    endocrinologist-- dr Dwyane Dee  . IBS (irritable bowel syndrome)   . Ovarian cyst    Bilateral  . Seasonal allergies   . Wears glasses     Past Surgical History:  Procedure Laterality Date  . ABDOMINAL HYSTERECTOMY  2017  . BREAST LUMPECTOMY WITH RADIOACTIVE SEED LOCALIZATION Left 01/22/2016   Procedure: LEFT BREAST LUMPECTOMY WITH RADIOACTIVE SEED LOCALIZATION;  Surgeon: Fanny Skates, MD;  Location: [facility];  Service: General;   Laterality: Left;  . CESAREAN SECTION  x3  last one 2005   BILATERAL TUBAL LIGATION WITH LAST C/S  . CHOLECYSTECTOMY N/A 03/15/2016   Procedure: LAPAROSCOPIC CHOLECYSTECTOMY WITH INTRAOPERATIVE CHOLANGIOGRAM;  Surgeon: Jackolyn Confer, MD;  Location: [facility];  Service: General;  Laterality: N/A;  . COLONOSCOPY WITH ESOPHAGOGASTRODUODENOSCOPY (EGD)  06/18/2019  . CYSTO N/A 10/06/2015   Procedure: CYSTOSCOPY;  Surgeon: Nunzio Cobbs, MD;  Location: [facility];  Service: Gynecology;  Laterality: N/A;    Family History  Problem Relation Age of Onset  . Breast cancer Mother 31       s/p lump and rad  . Skin cancer Mother        non-melanoma  . Thyroid disease Mother   . Skin cancer Father        non-melanoma  . Hypertension Father   . Hyperlipidemia Father   . Breast cancer Paternal Grandmother        dx 42s; s/p mastectomy  . Cervical cancer Maternal Grandmother   . Arthritis Maternal Grandmother   . CVA Maternal Grandmother   . Stroke Maternal Grandmother 80  . Heart attack Maternal Grandfather        d. 2s  . Colon cancer Paternal Grandfather 69  . Rectal cancer Maternal Uncle 54  . Aortic aneurysm Maternal Uncle 53  . Uterine cancer Other        maternal great grandmother (MGM's mother)  . Stroke Other   . Cancer Other        maternal great grandfather (MGM's father); NOS cancer  . Breast cancer  Other        maternal great grandmother (MGF's mother)  . Colon cancer Other        maternal great grandfather (MGF's father)  . Heart attack Other   . Colon cancer Other        paternal great grandfather (PGF's father)  . Heart attack Other   . Stomach cancer Neg Hx   . Pancreatic cancer Neg Hx   . Esophageal cancer Neg Hx     SOCIAL HX: see hpi   Current Outpatient Medications:  .  albuterol (VENTOLIN HFA) 108 (90 Base) MCG/ACT inhaler, INHALE 1 PUFF INTO THE LUNGS EVERY 6 (SIX) HOURS AS NEEDED FOR WHEEZING OR SHORTNESS OF BREATH., Disp: 18 g, Rfl: 0 .   levothyroxine (SYNTHROID) 88 MCG tablet, Take 1 tablet by mouth once daily. **Must make appt for further refills.**, Disp: 30 tablet, Rfl: 0 .  liothyronine (CYTOMEL) 5 MCG tablet, Take 2 tablets (10 mcg total) by mouth daily., Disp: 60 tablet, Rfl: 2 .  loratadine (CLARITIN) 10 MG tablet, Take 1 tablet (10 mg total) by mouth daily., Disp: 90 tablet, Rfl: 0 .  venlafaxine XR (EFFEXOR-XR) 150 MG 24 hr capsule, Take once daily, Disp: 90 capsule, Rfl: 1  EXAM:  VITALS per patient if applicable:  GENERAL: alert, oriented, appears well and in no acute distress  HEENT: atraumatic, conjunttiva clear, no obvious abnormalities on inspection of external nose and ears  NECK: normal movements of the head and neck  LUNGS: on inspection no signs of respiratory distress, breathing rate appears normal, no obvious gross SOB, gasping or wheezing  CV: no obvious cyanosis  MS: moves all visible extremities without noticeable abnormality  PSYCH/NEURO: pleasant and cooperative, no obvious depression or anxiety, speech and thought processing grossly intact  ASSESSMENT AND PLAN:  Discussed the following assessment and plan:  Generalized anxiety disorder -medication management: refills sent -advised if ever stops medication taper would be advised -mood assessed as stable  Other specified hypothyroidism -advised recheck with [name] or with endocrinology, she does checks with endo  Vitamin D deficiency -advised to continue vit D3 supplementation 1000 IU daily  Mild intermittent asthma, unspecified whether complicated -medication management: albuterol refilled -stable  Obesity with serious comorbidity, unspecified classification, unspecified obesity type Hyperlipidemia, unspecified hyperlipidemia type -advised healthy lifestyle -advised to schedule CPE/TOC with possible labs -she agrees and prefers to see Dr. Ethlyn Gallery will, send message to scheduling to assist, can do appropriate acutes/refills with  telemedicine as needed  I discussed the assessment and treatment plan with the patient. The patient was provided an opportunity to ask questions and all were answered. The patient agreed with the plan and demonstrated an understanding of the instructions.   The patient was advised to call back or seek an in-person evaluation if the symptoms worsen or if the condition fails to improve as anticipated.   Lucretia Kern, DO

## 2019-09-23 ENCOUNTER — Telehealth: Payer: Self-pay | Admitting: Family Medicine

## 2019-09-23 ENCOUNTER — Ambulatory Visit: Payer: No Typology Code available for payment source | Admitting: Obstetrics and Gynecology

## 2019-09-23 NOTE — Telephone Encounter (Signed)
-----   Message from Lucretia Kern, DO sent at 07/23/2019 11:36 AM EST ----- Looks like patient needs in office PCP. Please see who she would like to see. I am happy to see her for any virtual visits if she sticks with a [facility] PCP. Thanks. ----- Message ----- From: Nunzio Cobbs, MD Sent: 06/30/2019   6:14 PM EST To: Lucretia Kern, DO

## 2019-09-23 NOTE — Telephone Encounter (Signed)
Called to do a TOC, left vm to call back

## 2019-10-21 ENCOUNTER — Other Ambulatory Visit: Payer: Self-pay | Admitting: Endocrinology

## 2019-10-30 ENCOUNTER — Other Ambulatory Visit: Payer: Self-pay

## 2019-10-30 ENCOUNTER — Ambulatory Visit: Payer: No Typology Code available for payment source | Admitting: Family Medicine

## 2019-10-30 ENCOUNTER — Emergency Department (HOSPITAL_COMMUNITY)
Admission: EM | Admit: 2019-10-30 | Discharge: 2019-10-30 | Disposition: A | Discharge status: Home / Self Care | Payer: No Typology Code available for payment source | Attending: Emergency Medicine | Admitting: Emergency Medicine

## 2019-10-30 ENCOUNTER — Encounter (HOSPITAL_COMMUNITY): Payer: Self-pay | Admitting: Emergency Medicine

## 2019-10-30 ENCOUNTER — Emergency Department (HOSPITAL_COMMUNITY): Payer: No Typology Code available for payment source

## 2019-10-30 ENCOUNTER — Telehealth: Payer: No Typology Code available for payment source | Admitting: Family Medicine

## 2019-10-30 ENCOUNTER — Emergency Department (HOSPITAL_BASED_OUTPATIENT_CLINIC_OR_DEPARTMENT_OTHER): Payer: No Typology Code available for payment source

## 2019-10-30 DIAGNOSIS — F17210 Nicotine dependence, cigarettes, uncomplicated: Secondary | ICD-10-CM | POA: Diagnosis not present

## 2019-10-30 DIAGNOSIS — R61 Generalized hyperhidrosis: Secondary | ICD-10-CM | POA: Diagnosis not present

## 2019-10-30 DIAGNOSIS — R112 Nausea with vomiting, unspecified: Secondary | ICD-10-CM | POA: Insufficient documentation

## 2019-10-30 DIAGNOSIS — R82998 Other abnormal findings in urine: Secondary | ICD-10-CM | POA: Insufficient documentation

## 2019-10-30 DIAGNOSIS — M79609 Pain in unspecified limb: Secondary | ICD-10-CM

## 2019-10-30 DIAGNOSIS — R609 Edema, unspecified: Secondary | ICD-10-CM | POA: Diagnosis not present

## 2019-10-30 DIAGNOSIS — M79605 Pain in left leg: Secondary | ICD-10-CM | POA: Insufficient documentation

## 2019-10-30 DIAGNOSIS — M7989 Other specified soft tissue disorders: Secondary | ICD-10-CM | POA: Diagnosis not present

## 2019-10-30 DIAGNOSIS — M79652 Pain in left thigh: Secondary | ICD-10-CM | POA: Diagnosis present

## 2019-10-30 DIAGNOSIS — N200 Calculus of kidney: Secondary | ICD-10-CM | POA: Diagnosis not present

## 2019-10-30 DIAGNOSIS — J45909 Unspecified asthma, uncomplicated: Secondary | ICD-10-CM | POA: Insufficient documentation

## 2019-10-30 DIAGNOSIS — Z79899 Other long term (current) drug therapy: Secondary | ICD-10-CM | POA: Insufficient documentation

## 2019-10-30 DIAGNOSIS — M5430 Sciatica, unspecified side: Secondary | ICD-10-CM | POA: Insufficient documentation

## 2019-10-30 LAB — URINALYSIS, ROUTINE W REFLEX MICROSCOPIC
Glucose, UA: NEGATIVE mg/dL
Ketones, ur: 80 mg/dL — AB
Nitrite: NEGATIVE
Protein, ur: 100 mg/dL — AB
Specific Gravity, Urine: 1.032 — ABNORMAL HIGH (ref 1.005–1.030)
pH: 6 (ref 5.0–8.0)

## 2019-10-30 LAB — BASIC METABOLIC PANEL
Anion gap: 13 (ref 5–15)
BUN: 18 mg/dL (ref 6–20)
CO2: 20 mmol/L — ABNORMAL LOW (ref 22–32)
Calcium: 9.3 mg/dL (ref 8.9–10.3)
Chloride: 106 mmol/L (ref 98–111)
Creatinine, Ser: 0.87 mg/dL (ref 0.44–1.00)
GFR calc Af Amer: >60 mL/min (ref >60)
GFR calc non Af Amer: >60 mL/min (ref >60)
Glucose, Bld: 97 mg/dL (ref 70–99)
Potassium: 3.4 mmol/L — ABNORMAL LOW (ref 3.5–5.1)
Sodium: 139 mmol/L (ref 135–145)

## 2019-10-30 LAB — CBC WITH DIFFERENTIAL/PLATELET
Abs Immature Granulocytes: 0.04 10*3/uL (ref 0.00–0.07)
Basophils Absolute: 0 10*3/uL (ref 0.0–0.1)
Basophils Relative: 0 %
Eosinophils Absolute: 0 10*3/uL (ref 0.0–0.5)
Eosinophils Relative: 0 %
HCT: 43.1 % (ref 36.0–46.0)
Hemoglobin: 14.4 g/dL (ref 12.0–15.0)
Immature Granulocytes: 0 %
Lymphocytes Relative: 20 %
Lymphs Abs: 2.5 10*3/uL (ref 0.7–4.0)
MCH: 29.1 pg (ref 26.0–34.0)
MCHC: 33.4 g/dL (ref 30.0–36.0)
MCV: 87.2 fL (ref 80.0–100.0)
Monocytes Absolute: 0.8 10*3/uL (ref 0.1–1.0)
Monocytes Relative: 6 %
Neutro Abs: 9.2 10*3/uL — ABNORMAL HIGH (ref 1.7–7.7)
Neutrophils Relative %: 74 %
Platelets: 464 10*3/uL — ABNORMAL HIGH (ref 150–400)
RBC: 4.94 MIL/uL (ref 3.87–5.11)
RDW: 12.7 % (ref 11.5–15.5)
WBC: 12.5 10*3/uL — ABNORMAL HIGH (ref 4.0–10.5)
nRBC: 0 % (ref 0.0–0.2)

## 2019-10-30 LAB — CK: Total CK: 80 U/L (ref 38–234)

## 2019-10-30 MED ORDER — MORPHINE SULFATE (PF) 4 MG/ML IV SOLN
8.0000 mg | Freq: Once | INTRAVENOUS | Status: AC
  Administered 2019-10-30: 8 mg via INTRAVENOUS
  Filled 2019-10-30: qty 2

## 2019-10-30 MED ORDER — SODIUM CHLORIDE 0.9 % IV SOLN
1000.0000 mL | INTRAVENOUS | Status: DC
  Administered 2019-10-30: 1000 mL via INTRAVENOUS

## 2019-10-30 MED ORDER — KETOROLAC TROMETHAMINE 60 MG/2ML IM SOLN: 60.0000 mg | Freq: Once | INTRAMUSCULAR | Status: DC

## 2019-10-30 MED ORDER — OXYCODONE-ACETAMINOPHEN 5-325 MG PO TABS: 1.0000 | ORAL_TABLET | Freq: Four times a day (QID) | ORAL | 0 refills | Status: DC | PRN

## 2019-10-30 MED ORDER — ONDANSETRON 4 MG PO TBDP
4.0000 mg | ORAL_TABLET | Freq: Once | ORAL | Status: AC | PRN
  Administered 2019-10-30: 4 mg via ORAL
  Filled 2019-10-30: qty 1

## 2019-10-30 MED ORDER — ONDANSETRON HCL 4 MG PO TABS: 4.0000 mg | ORAL_TABLET | Freq: Three times a day (TID) | ORAL | 0 refills | Status: DC | PRN

## 2019-10-30 MED ORDER — ONDANSETRON 4 MG PO TBDP: ORAL_TABLET | ORAL | 0 refills | Status: DC

## 2019-10-30 MED ORDER — NAPROXEN 500 MG PO TABS: 500.0000 mg | ORAL_TABLET | Freq: Two times a day (BID) | ORAL | 0 refills | Status: DC

## 2019-10-30 MED ORDER — OXYCODONE-ACETAMINOPHEN 5-325 MG PO TABS: 1.0000 | ORAL_TABLET | Freq: Three times a day (TID) | ORAL | 0 refills | Status: DC | PRN

## 2019-10-30 MED ORDER — MORPHINE 100MG IN NS 100ML (1MG/ML) PREMIX INFUSION
5.0000 mg/h | INTRAVENOUS | Status: DC
  Filled 2019-10-30: qty 100

## 2019-10-30 MED ORDER — OXYCODONE-ACETAMINOPHEN 5-325 MG PO TABS
1.0000 | ORAL_TABLET | Freq: Once | ORAL | Status: AC
  Administered 2019-10-30: 1 via ORAL
  Filled 2019-10-30: qty 1

## 2019-10-30 MED ORDER — DIAZEPAM 5 MG PO TABS
5.0000 mg | ORAL_TABLET | Freq: Once | ORAL | Status: AC
  Administered 2019-10-30: 5 mg via ORAL
  Filled 2019-10-30: qty 1

## 2019-10-30 MED ORDER — TAMSULOSIN HCL 0.4 MG PO CAPS: 0.4000 mg | ORAL_CAPSULE | Freq: Every day | ORAL | 0 refills | Status: DC

## 2019-10-30 MED ORDER — SODIUM CHLORIDE 0.9 % IV BOLUS (SEPSIS)
500.0000 mL | Freq: Once | INTRAVENOUS | Status: AC
  Administered 2019-10-30: 500 mL via INTRAVENOUS

## 2019-10-30 MED ORDER — KETOROLAC TROMETHAMINE 30 MG/ML IJ SOLN: 30.0000 mg | Freq: Once | INTRAMUSCULAR | Status: DC

## 2019-10-30 MED ORDER — NAPROXEN 375 MG PO TABS: 375.0000 mg | ORAL_TABLET | Freq: Two times a day (BID) | ORAL | 0 refills | Status: DC

## 2019-10-30 MED ORDER — KETOROLAC TROMETHAMINE 30 MG/ML IJ SOLN
30.0000 mg | Freq: Once | INTRAMUSCULAR | Status: AC
  Administered 2019-10-30: 30 mg via INTRAVENOUS
  Filled 2019-10-30: qty 1

## 2019-10-30 NOTE — ED Provider Notes (Signed)
[facility] DEPT Provider Note   CSN: [id number] Arrival date & time: 10/30/19  1112     History Chief Complaint  Patient presents with  . Leg Pain    Maria [name] is a 49 y.o. female.  HPI  49 year old female with a history of hypothyroidism, anxiety, status post bilateral oophorectomy in January 2021 presents to the ER with left thigh pain that began on Monday.  She complains of sweating, feeling cold, nausea/vomiting without mid emesis along with throbbing constant posterior calf to hamstring pain.  She noticed the pain starting in her left calf on Monday, and it has progressively traveled up her leg and is now in her hamstring.  Patient has tried Tylenol with no relief, states heating pad provides very mild short-term relief.  She also notes dark, slightly reddish urine.  She has been having difficulty holding down fluids/food, patient has not taken temperature at home.  She rates the pain a 9 out of 10.  Denies shortness of breath, chest pain, palpitations, weakness.  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  No history of DVTs.     Past Medical History:  Diagnosis Date  . Asthma    followed by pcp  . Generalized anxiety disorder   . Hiatal hernia   . History of anemia   . History of pancreatitis    2017 due to gallstones-- resolved  . Hypothyroidism    endocrinologist-- dr Dwyane Dee  . IBS (irritable bowel syndrome)   . Ovarian cyst    Bilateral  . Seasonal allergies   . Wears glasses     Patient Active Problem List   Diagnosis Date Noted  . Left ovarian cyst 05/15/2019  . Right ovarian cyst 05/15/2019  . Vitamin D deficiency 12/14/2017  . 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 Genetic testing 06/20/2016  . Family history of breast cancer in female 05/12/2016  . Family history of colorectal cancer 05/12/2016  . Fatty liver 02/22/2016  . Abnormal mammogram of left breast 01/22/2016  . Breast cyst 11/21/2014  . Menorrhagia  11/21/2014  . Hypothyroidism 11/21/2014  . Obesity 11/21/2014  . Seasonal allergies 11/21/2014  . Asthma, chronic 11/21/2014  . Generalized anxiety disorder 11/21/2014    Past Surgical History:  Procedure Laterality Date  . ABDOMINAL HYSTERECTOMY  2017  . BREAST LUMPECTOMY WITH RADIOACTIVE SEED LOCALIZATION Left 01/22/2016   Procedure: LEFT BREAST LUMPECTOMY WITH RADIOACTIVE SEED LOCALIZATION;  Surgeon: Fanny Skates, MD;  Location: [facility];  Service: General;  Laterality: Left;  . CESAREAN SECTION  x3  last one 2005   BILATERAL TUBAL LIGATION WITH LAST C/S  . CHOLECYSTECTOMY N/A 03/15/2016   Procedure: LAPAROSCOPIC CHOLECYSTECTOMY WITH INTRAOPERATIVE CHOLANGIOGRAM;  Surgeon: Jackolyn Confer, MD;  Location: [facility];  Service: General;  Laterality: N/A;  . COLONOSCOPY WITH ESOPHAGOGASTRODUODENOSCOPY (EGD)  06/18/2019  . CYSTO N/A 10/06/2015   Procedure: CYSTOSCOPY;  Surgeon: Nunzio Cobbs, MD;  Location: [facility];  Service: Gynecology;  Laterality: N/A;     OB History    Gravida  3   Para  3   Term  3   Preterm      AB      Living        SAB      TAB      Ectopic      Multiple      Live Births  Family History  Problem Relation Age of Onset  . Breast cancer Mother 73       s/p lump and rad  . Skin cancer Mother        non-melanoma  . Thyroid disease Mother   . Skin cancer Father        non-melanoma  . Hypertension Father   . Hyperlipidemia Father   . Breast cancer Paternal Grandmother        dx 55s; s/p mastectomy  . Cervical cancer Maternal Grandmother   . Arthritis Maternal Grandmother   . CVA Maternal Grandmother   . Stroke Maternal Grandmother 80  . Heart attack Maternal Grandfather        d. 1s  . Colon cancer Paternal Grandfather 28  . Rectal cancer Maternal Uncle 54  . Aortic aneurysm Maternal Uncle 53  . Uterine cancer Other        maternal great grandmother (MGM's mother)  . Stroke Other   . Cancer  Other        maternal great grandfather (MGM's father); NOS cancer  . Breast cancer Other        maternal great grandmother (MGF's mother)  . Colon cancer Other        maternal great grandfather (MGF's father)  . Heart attack Other   . Colon cancer Other        paternal great grandfather (PGF's father)  . Heart attack Other   . Stomach cancer Neg Hx   . Pancreatic cancer Neg Hx   . Esophageal cancer Neg Hx     Social History   Tobacco Use  . Smoking status: Current Some Day Smoker    Years: 25.00    Types: Cigarettes  . Smokeless tobacco: Never Used  . Tobacco comment: 08-07-2019 social  Substance Use Topics  . Alcohol use: Yes    Comment: socially  . Drug use: Never    Home Medications Prior to Admission medications   Medication Sig Start Date End Date Taking? Authorizing Provider  albuterol (VENTOLIN HFA) 108 (90 Base) MCG/ACT inhaler INHALE 1 PUFF INTO THE LUNGS EVERY 6 (SIX) HOURS AS NEEDED FOR WHEEZING OR SHORTNESS OF BREATH. 08/27/19   Lucretia Kern, DO  levothyroxine (SYNTHROID) 88 MCG tablet Take 1 tablet by mouth once daily. **Must make appt for further refills.** 08/26/19   Elayne Snare, MD  liothyronine (CYTOMEL) 5 MCG tablet TAKE TWO TABLETS BY MOUTH DAILY 10/21/19   Elayne Snare, MD  loratadine (CLARITIN) 10 MG tablet Take 1 tablet (10 mg total) by mouth daily. 11/21/14   Lucretia Kern, DO  naproxen (NAPROSYN) 500 MG tablet Take 1 tablet (500 mg total) by mouth 2 (two) times daily. 10/30/19   Garald Balding, PA-C  ondansetron (ZOFRAN) 4 MG tablet Take 1 tablet (4 mg total) by mouth every 8 (eight) hours as needed for nausea or vomiting. 10/30/19   Garald Balding, PA-C  oxyCODONE-acetaminophen (PERCOCET/ROXICET) 5-325 MG tablet Take 1 tablet by mouth every 8 (eight) hours as needed for severe pain. 10/30/19   Varney Biles, MD  venlafaxine XR (EFFEXOR-XR) 150 MG 24 hr capsule Take once daily 08/27/19   Lucretia Kern, DO    Allergies    Patient has no known  allergies.  Review of Systems   Review of Systems  Physical Exam Updated Vital Signs BP (!) 110/95 (BP Location: Left Arm)   Pulse 100   Temp 97.9 F (36.6 C) (Oral)   Resp 18  LMP 09/26/2015 (Exact Date)   SpO2 100%   Physical Exam  ED Results / Procedures / Treatments   Labs (all labs ordered are listed, but only abnormal results are displayed) Labs Reviewed  BASIC METABOLIC PANEL - Abnormal; Notable for the following components:      Result Value   Potassium 3.4 (*)    CO2 20 (*)    All other components within normal limits  CBC WITH DIFFERENTIAL/PLATELET - Abnormal; Notable for the following components:   WBC 12.5 (*)    Platelets 464 (*)    Neutro Abs 9.2 (*)    All other components within normal limits  CK  URINALYSIS, ROUTINE W REFLEX MICROSCOPIC    EKG None  Radiology VAS [name] LOWER EXTREMITY VENOUS (DVT) (ONLY [facility] & [facility] 7a-7p)  Result Date: 10/30/2019  Lower Venous DVTStudy Indications: Edema, Swelling, and Pain.  Comparison Study: no prior Performing Technologist: Abram Sander RVS  Examination Guidelines: A complete evaluation includes B-mode imaging, spectral Doppler, color Doppler, and power Doppler as needed of all accessible portions of each vessel. Bilateral testing is considered an integral part of a complete examination. Limited examinations for reoccurring indications may be performed as noted. The reflux portion of the exam is performed with the patient in reverse Trendelenburg.  +---------+---------------+---------+-----------+----------+--------------+ RIGHT    CompressibilityPhasicitySpontaneityPropertiesThrombus Aging +---------+---------------+---------+-----------+----------+--------------+ CFV      Full           Yes      Yes                                 +---------+---------------+---------+-----------+----------+--------------+ SFJ      Full                                                         +---------+---------------+---------+-----------+----------+--------------+ FV Prox  Full                                                        +---------+---------------+---------+-----------+----------+--------------+ FV Mid   Full                                                        +---------+---------------+---------+-----------+----------+--------------+ FV DistalFull                                                        +---------+---------------+---------+-----------+----------+--------------+ PFV      Full                                                        +---------+---------------+---------+-----------+----------+--------------+ POP      Full  Yes      Yes                                 +---------+---------------+---------+-----------+----------+--------------+ PTV      Full                                                        +---------+---------------+---------+-----------+----------+--------------+ PERO                                                  Not visualized +---------+---------------+---------+-----------+----------+--------------+   +----+---------------+---------+-----------+----------+--------------+ LEFTCompressibilityPhasicitySpontaneityPropertiesThrombus Aging +----+---------------+---------+-----------+----------+--------------+ CFV Full           Yes      Yes                                 +----+---------------+---------+-----------+----------+--------------+     Summary: RIGHT: - There is no evidence of deep vein thrombosis in the lower extremity.  - No cystic structure found in the popliteal fossa.  LEFT: - No evidence of common femoral vein obstruction.  *See table(s) above for measurements and observations. Electronically signed by Curt Jews MD on 10/30/2019 at 3:48:29 PM.    Final     Procedures Procedures (including critical care time)  Medications Ordered in ED Medications  sodium  chloride 0.9 % bolus 500 mL (0 mLs Intravenous Stopped 10/30/19 1459)    Followed by  0.9 %  sodium chloride infusion (1,000 mLs Intravenous New Bag/Given 10/30/19 1355)  ondansetron (ZOFRAN-ODT) disintegrating tablet 4 mg (4 mg Oral Given 10/30/19 1332)  oxyCODONE-acetaminophen (PERCOCET/ROXICET) 5-325 MG per tablet 1 tablet (1 tablet Oral Given 10/30/19 1332)  diazepam (VALIUM) tablet 5 mg (5 mg Oral Given 10/30/19 1519)  ketorolac (TORADOL) 30 MG/ML injection 30 mg (30 mg Intravenous Given 10/30/19 1520)  morphine 4 MG/ML injection 8 mg (8 mg Intravenous Given 10/30/19 1521)    ED Course  I have reviewed the triage vital signs and the nursing notes.  Pertinent labs & imaging results that were available during my care of the patient were reviewed by me and considered in my medical decision making (see chart for details).  Clinical Course as of Oct 30 1626  Wed Oct 30, 2019  1229 Negative for DVT   [MB]    Clinical Course User Index [MB] Lyndel Safe   MDM Rules/Calculators/A&P  49 year old with a history of hypothyroidism, recent oophorectomy in January 2021 presents to the ER for left leg pain for the last 2 days.                   -Patient is hypertensive at 153/111 and tachycardic at 111.  Patient afebrile.  Oxygen sats -Mild leukocytosis at 12.5, other labs unremarkable.  The patient describes dark orange-colored urine, doubt rhabdo, given normal CK -DDx includes DVT, vasculitis, cellulitis, sciatica, muscle spasm/strain.  Possible sciatica given patient's pain is exacerbated upon movement. -Unsure etiology of nausea, vomiting, chills.  Possible viral GI infection.  Abdomen soft and nontender.  Doubt appendicitis, diverticulitis given negative physical exams, afebrile.  Patient does  admit to smoking marijuana approximately a week ago.  Possible marijuana hyperemesis. -Venous Doppler ultrasound of left extremity negative for DVT. -Patient given Zofran, Percocet, Valium, 500 mL  bolus of fluids in ED -Will send patient home with Toradol, Percocet Zofran for pain and recommend follow-up with PCP ASAP.  Strict return precautions discussed.  Patient is agreeable to this plan -Pending UA, if infection noted, will prescribe antibiotics to pharmacy. -Unclear etiology of symptoms, possible sciatica.  Ruled out life-threatening causes of symptoms including DVT, rhabdo Final Clinical Impression(s) / ED Diagnoses Final diagnoses:  Left leg pain  Intractable vomiting with nausea, unspecified vomiting type    Rx / DC Orders ED Discharge Orders         Ordered    oxyCODONE-acetaminophen (PERCOCET/ROXICET) 5-325 MG tablet  Every 8 hours PRN     10/30/19 1512    naproxen (NAPROSYN) 500 MG tablet  2 times daily     10/30/19 1512    ondansetron (ZOFRAN) 4 MG tablet  Every 8 hours PRN     10/30/19 1512           Lyndel Safe 10/30/19 [place], Ankit, MD 10/31/19 1059

## 2019-10-30 NOTE — ED Notes (Signed)
Pt reports severe abd pain.  Shanon Brow, NP made aware.

## 2019-10-30 NOTE — Discharge Instructions (Addendum)
You were seen in the ER for left leg pains, nausea and vomiting.  You had a ultrasound which was negative for DVT, suspect the cause of your leg pain could be sciatica which is a impingement of the nerve.  Nausea and vomiting could have multiple sources, but given asymptomatic abdominal exam and no urinary symptoms, we have ruled out any life-threatening causes of this.  Your CT reveals left sided kidney stones. Your have been sent home with a urine strainer to attempt to capture the stone. The stone can be evaluated by urology. Referral information for urology is attached. It is also important to follow-up with your primary care provider Dr. Maudie Mercury as soon as possible.  Return to the ER if your symptoms continue to get worse.

## 2019-10-30 NOTE — ED Provider Notes (Signed)
Patient received in sign out from [place], [place]. Patient presented with back and leg pain with nausea. Doppler ultrasound of extremities negative for DVT. rine noted to have blood and white cells. Concern for kidney stones. Renal stone study pending.  Results for orders placed or performed during the hospital encounter of 0000000  Basic metabolic panel  Result Value Ref Range   Sodium 139 135 - 145 mmol/L   Potassium 3.4 (L) 3.5 - 5.1 mmol/L   Chloride 106 98 - 111 mmol/L   CO2 20 (L) 22 - 32 mmol/L   Glucose, Bld 97 70 - 99 mg/dL   BUN 18 6 - 20 mg/dL   Creatinine, Ser 0.87 0.44 - 1.00 mg/dL   Calcium 9.3 8.9 - 10.3 mg/dL   GFR calc non Af Amer >60 >60 mL/min   GFR calc Af Amer >60 >60 mL/min   Anion gap 13 5 - 15  CBC with Differential  Result Value Ref Range   WBC 12.5 (H) 4.0 - 10.5 K/uL   RBC 4.94 3.87 - 5.11 MIL/uL   Hemoglobin 14.4 12.0 - 15.0 g/dL   HCT 43.1 36.0 - 46.0 %   MCV 87.2 80.0 - 100.0 fL   MCH 29.1 26.0 - 34.0 pg   MCHC 33.4 30.0 - 36.0 g/dL   RDW 12.7 11.5 - 15.5 %   Platelets 464 (H) 150 - 400 K/uL   nRBC 0.0 0.0 - 0.2 %   Neutrophils Relative % 74 %   Neutro Abs 9.2 (H) 1.7 - 7.7 K/uL   Lymphocytes Relative 20 %   Lymphs Abs 2.5 0.7 - 4.0 K/uL   Monocytes Relative 6 %   Monocytes Absolute 0.8 0.1 - 1.0 K/uL   Eosinophils Relative 0 %   Eosinophils Absolute 0.0 0.0 - 0.5 K/uL   Basophils Relative 0 %   Basophils Absolute 0.0 0.0 - 0.1 K/uL   Immature Granulocytes 0 %   Abs Immature Granulocytes 0.04 0.00 - 0.07 K/uL  Urinalysis, Routine w reflex microscopic  Result Value Ref Range   Color, Urine AMBER (A) YELLOW   APPearance CLOUDY (A) CLEAR   Specific Gravity, Urine 1.032 (H) 1.005 - 1.030   pH 6.0 5.0 - 8.0   Glucose, UA NEGATIVE NEGATIVE mg/dL   Hgb urine dipstick MODERATE (A) NEGATIVE   Bilirubin Urine SMALL (A) NEGATIVE   Ketones, ur 80 (A) NEGATIVE mg/dL   Protein, ur 100 (A) NEGATIVE mg/dL   Nitrite NEGATIVE NEGATIVE   Leukocytes,Ua  MODERATE (A) NEGATIVE   RBC / HPF 21-50 0 - 5 RBC/hpf   WBC, UA 11-20 0 - 5 WBC/hpf   Bacteria, UA RARE (A) NONE SEEN   Squamous Epithelial / LPF 21-50 0 - 5   Mucus PRESENT   CK  Result Value Ref Range   Total CK 80 38 - 234 U/L   CT Renal Stone Study  Result Date: 10/30/2019 CLINICAL DATA:  Hematuria with an unknown cause. EXAM: CT ABDOMEN AND PELVIS WITHOUT CONTRAST TECHNIQUE: Multidetector CT imaging of the abdomen and pelvis was performed following the standard protocol without IV contrast. COMPARISON:  05/08/2019. FINDINGS: Lower chest: The lung bases are clear. The heart size is normal. Hepatobiliary: There is decreased hepatic attenuation suggestive of hepatic steatosis. Status post cholecystectomy.There is no biliary ductal dilation. Pancreas: Normal contours without ductal dilatation. No peripancreatic fluid collection. Spleen: No splenic laceration or hematoma. Adrenals/Urinary Tract: --Adrenal glands: No adrenal hemorrhage. --Right kidney/ureter: No hydronephrosis or perinephric hematoma. --Left kidney/ureter:  There are nonobstructing left-sided kidney stones measuring up to approximately 3-4 mm. --Urinary bladder: The bladder is decompressed and therefore is poorly evaluated. Stomach/Bowel: --Stomach/Duodenum: No hiatal hernia or other gastric abnormality. Normal duodenal course and caliber. --Small bowel: No dilatation or inflammation. --Colon: No focal abnormality. --Appendix: Normal. Vascular/Lymphatic: Normal course and caliber of the major abdominal vessels. --No retroperitoneal lymphadenopathy. --No mesenteric lymphadenopathy. --No pelvic or inguinal lymphadenopathy. Reproductive: Status post hysterectomy. No adnexal mass. Other: No ascites or free air. The abdominal wall is normal. Musculoskeletal. No acute displaced fractures. IMPRESSION: 1. Nonobstructing left-sided nephrolithiasis. 2. Hepatic steatosis. 3. Status post cholecystectomy. 4. Normal appendix in the right lower quadrant.  5. The bladder is decompressed and therefore is poorly evaluated. Electronically Signed   By: Constance Holster M.D.   On: 10/30/2019 17:52   VAS [name] LOWER EXTREMITY VENOUS (DVT) (ONLY [facility] & [facility] 7a-7p)  Result Date: 10/30/2019  Lower Venous DVTStudy Indications: Edema, Swelling, and Pain.  Comparison Study: no prior Performing Technologist: Abram Sander RVS  Examination Guidelines: A complete evaluation includes B-mode imaging, spectral Doppler, color Doppler, and power Doppler as needed of all accessible portions of each vessel. Bilateral testing is considered an integral part of a complete examination. Limited examinations for reoccurring indications may be performed as noted. The reflux portion of the exam is performed with the patient in reverse Trendelenburg.  +---------+---------------+---------+-----------+----------+--------------+ RIGHT    CompressibilityPhasicitySpontaneityPropertiesThrombus Aging +---------+---------------+---------+-----------+----------+--------------+ CFV      Full           Yes      Yes                                 +---------+---------------+---------+-----------+----------+--------------+ SFJ      Full                                                        +---------+---------------+---------+-----------+----------+--------------+ FV Prox  Full                                                        +---------+---------------+---------+-----------+----------+--------------+ FV Mid   Full                                                        +---------+---------------+---------+-----------+----------+--------------+ FV DistalFull                                                        +---------+---------------+---------+-----------+----------+--------------+ PFV      Full                                                        +---------+---------------+---------+-----------+----------+--------------+  POP      Full           Yes       Yes                                 +---------+---------------+---------+-----------+----------+--------------+ PTV      Full                                                        +---------+---------------+---------+-----------+----------+--------------+ PERO                                                  Not visualized +---------+---------------+---------+-----------+----------+--------------+   +----+---------------+---------+-----------+----------+--------------+ LEFTCompressibilityPhasicitySpontaneityPropertiesThrombus Aging +----+---------------+---------+-----------+----------+--------------+ CFV Full           Yes      Yes                                 +----+---------------+---------+-----------+----------+--------------+     Summary: RIGHT: - There is no evidence of deep vein thrombosis in the lower extremity.  - No cystic structure found in the popliteal fossa.  LEFT: - No evidence of common femoral vein obstruction.  *See table(s) above for measurements and observations. Electronically signed by Curt Jews MD on 10/30/2019 at 3:48:29 PM.    Final    Patient had one episode of severe, cramp-like pain across the umbilical area, improved and resolved without intervention.   Pt has been diagnosed with a Kidney Stone via CT. There is no evidence of significant hydronephrosis, serum creatine WNL, vitals sign stable and the pt does not have irratractable vomiting. Pt will be dc home with pain medications & has been advised to follow up with PCP.     Etta Quill, NP 10/31/19 Adelfa Koh    Blanchie Dessert, MD 10/31/19 1140

## 2019-10-30 NOTE — ED Notes (Signed)
Vascular at bedside

## 2019-10-30 NOTE — ED Notes (Signed)
Pt transported to CT ?

## 2019-10-30 NOTE — Progress Notes (Signed)
Lower extremity venous has been completed.   Preliminary results in CV Proc.   Abram Sander 10/30/2019 1:08 PM

## 2019-10-30 NOTE — ED Triage Notes (Signed)
Per pt, states right thigh pain for over a month-states it started in lower leg and moved up to thigh-states pain is making her nauseated-states she had her ovaries removed in December

## 2019-10-31 ENCOUNTER — Telehealth (INDEPENDENT_AMBULATORY_CARE_PROVIDER_SITE_OTHER): Payer: No Typology Code available for payment source | Admitting: Family Medicine

## 2019-10-31 ENCOUNTER — Encounter: Payer: Self-pay | Admitting: Family Medicine

## 2019-10-31 DIAGNOSIS — R112 Nausea with vomiting, unspecified: Secondary | ICD-10-CM

## 2019-10-31 DIAGNOSIS — R7989 Other specified abnormal findings of blood chemistry: Secondary | ICD-10-CM | POA: Diagnosis not present

## 2019-10-31 DIAGNOSIS — T148XXA Other injury of unspecified body region, initial encounter: Secondary | ICD-10-CM

## 2019-10-31 DIAGNOSIS — D72829 Elevated white blood cell count, unspecified: Secondary | ICD-10-CM

## 2019-10-31 DIAGNOSIS — R6883 Chills (without fever): Secondary | ICD-10-CM

## 2019-10-31 DIAGNOSIS — R5383 Other fatigue: Secondary | ICD-10-CM

## 2019-10-31 DIAGNOSIS — R61 Generalized hyperhidrosis: Secondary | ICD-10-CM

## 2019-10-31 DIAGNOSIS — M79604 Pain in right leg: Secondary | ICD-10-CM

## 2019-10-31 NOTE — Progress Notes (Addendum)
Virtual Visit via Video Note  I connected with Maria [name]  on 10/31/19 at 11:00 AM EDT by a video enabled telemedicine application and verified that I am speaking with the correct person using two identifiers.  Location patient: home Location provider:work or home office Persons participating in the virtual visit: patient, provider  I discussed the limitations of evaluation and management by telemedicine and the availability of in person appointments. The patient expressed understanding and agreed to proceed.   HPI:  Acute visit for bruising, pain, feeling sick: -symptoms started:  3 days ago -symptoms include: severe R leg pain - worsening, nausea, vomiting, soaking sweats, chills, feels tired, emotional  -today woke up with extensive bruising all over her legs worsening rapidly and feels awful -evaluated in the emergency room for this yesterday -workup included urine dip (abnormal with wbc, rbc, cloudy, protein, ketones, some bacteria), labs with mildly elevated WBCs and platelets, negative duplex US LE, renal CT scan (done because of brief abd pain, NV) showing L nonobstructive nephrolithiasis -she reports she was not provided any treatment, was told this could be an infection and that kidney stones could be causing the leg pain, reports was told to follow up with PCP, currently no PCP - she plans to establish with Dr. Ethlyn Gallery   ROS: See pertinent positives and negatives per HPI.  Past Medical History:  Diagnosis Date  . Asthma    followed by pcp  . Generalized anxiety disorder   . Hiatal hernia   . History of anemia   . History of pancreatitis    2017 due to gallstones-- resolved  . Hypothyroidism    endocrinologist-- dr Dwyane Dee  . IBS (irritable bowel syndrome)   . Ovarian cyst    Bilateral  . Seasonal allergies   . Wears glasses     Past Surgical History:  Procedure Laterality Date  . ABDOMINAL HYSTERECTOMY  2017  . BREAST LUMPECTOMY WITH RADIOACTIVE SEED LOCALIZATION  Left 01/22/2016   Procedure: LEFT BREAST LUMPECTOMY WITH RADIOACTIVE SEED LOCALIZATION;  Surgeon: Fanny Skates, MD;  Location: [facility];  Service: General;  Laterality: Left;  . CESAREAN SECTION  x3  last one 2005   BILATERAL TUBAL LIGATION WITH LAST C/S  . CHOLECYSTECTOMY N/A 03/15/2016   Procedure: LAPAROSCOPIC CHOLECYSTECTOMY WITH INTRAOPERATIVE CHOLANGIOGRAM;  Surgeon: Jackolyn Confer, MD;  Location[facility];  Service: General;  Laterality: N/A;  . COLONOSCOPY WITH ESOPHAGOGASTRODUODENOSCOPY (EGD)  06/18/2019  . CYSTO N/A 10/06/2015   Procedure: CYSTOSCOPY;  Surgeon: Nunzio Cobbs, MD;  Location: [facility];  Service: Gynecology;  Laterality: N/A;    Family History  Problem Relation Age of Onset  . Breast cancer Mother 6       s/p lump and rad  . Skin cancer Mother        non-melanoma  . Thyroid disease Mother   . Skin cancer Father        non-melanoma  . Hypertension Father   . Hyperlipidemia Father   . Breast cancer Paternal Grandmother        dx 47s; s/p mastectomy  . Cervical cancer Maternal Grandmother   . Arthritis Maternal Grandmother   . CVA Maternal Grandmother   . Stroke Maternal Grandmother 80  . Heart attack Maternal Grandfather        d. 72s  . Colon cancer Paternal Grandfather 68  . Rectal cancer Maternal Uncle 54  . Aortic aneurysm Maternal Uncle 53  . Uterine cancer Other  maternal great grandmother (MGM's mother)  . Stroke Other   . Cancer Other        maternal great grandfather (MGM's father); NOS cancer  . Breast cancer Other        maternal great grandmother (MGF's mother)  . Colon cancer Other        maternal great grandfather (MGF's father)  . Heart attack Other   . Colon cancer Other        paternal great grandfather (PGF's father)  . Heart attack Other   . Stomach cancer Neg Hx   . Pancreatic cancer Neg Hx   . Esophageal cancer Neg Hx     SOCIAL HX: see hpi   Current Outpatient Medications:  .  albuterol  (VENTOLIN HFA) 108 (90 Base) MCG/ACT inhaler, INHALE 1 PUFF INTO THE LUNGS EVERY 6 (SIX) HOURS AS NEEDED FOR WHEEZING OR SHORTNESS OF BREATH., Disp: 18 g, Rfl: 0 .  levothyroxine (SYNTHROID) 88 MCG tablet, Take 1 tablet by mouth once daily. **Must make appt for further refills.**, Disp: 30 tablet, Rfl: 0 .  liothyronine (CYTOMEL) 5 MCG tablet, TAKE TWO TABLETS BY MOUTH DAILY, Disp: 60 tablet, Rfl: 0 .  loratadine (CLARITIN) 10 MG tablet, Take 1 tablet (10 mg total) by mouth daily., Disp: 90 tablet, Rfl: 0 .  naproxen (NAPROSYN) 500 MG tablet, Take 1 tablet (500 mg total) by mouth 2 (two) times daily., Disp: 30 tablet, Rfl: 0 .  ondansetron (ZOFRAN) 4 MG tablet, Take 1 tablet (4 mg total) by mouth every 8 (eight) hours as needed for nausea or vomiting., Disp: 4 tablet, Rfl: 0 .  oxyCODONE-acetaminophen (PERCOCET/ROXICET) 5-325 MG tablet, Take 1 tablet by mouth every 8 (eight) hours as needed for severe pain., Disp: 6 tablet, Rfl: 0 .  tamsulosin (FLOMAX) 0.4 MG CAPS capsule, Take 1 capsule (0.4 mg total) by mouth daily., Disp: 10 capsule, Rfl: 0 .  venlafaxine XR (EFFEXOR-XR) 150 MG 24 hr capsule, Take once daily, Disp: 90 capsule, Rfl: 1  EXAM:  VITALS per patient if applicable:  GENERAL: pale, appears to not feel well, diophoretic  HEENT: atraumatic, conjunttiva clear, no obvious abnormalities on inspection of external nose and ears  NECK: normal movements of the head and neck  LUNGS: on inspection no signs of respiratory distress, breathing rate appears normal, no obvious gross SOB, gasping or wheezing  CV: no obvious cyanosis of the face  SKIN/LEGS: appears to have extensive ecchymosis of the posterior legs bilateral  MS: moves all visible extremities without noticeable abnormality  PSYCH/NEURO: pleasant and cooperative, no obvious depression or anxiety, speech and thought processing grossly intact  ASSESSMENT AND PLAN:  Discussed the following assessment and  plan:  Bruising  Leukocytosis, unspecified type  Elevated platelet count  Nausea and vomiting, intractability of vomiting not specified, unspecified vomiting type  Other fatigue  Diaphoresis  Chills  Pain of right lower extremity  -we discussed possible serious and likely etiologies, options for evaluation and workup, limitations of telemedicine visit vs in person visit, treatment, treatment risks and precautions. She looks like she does not feel well at all. Given systemic symptoms and what appears to be acute onset of extensive ecchymosis (difficult to evaluate well over video but does not look like EN or EM) along with abnormal urine and blood tests results from yesterday with rapid worsening of her symptoms advised needs prompt in-person evaluation w/ likely repeat labs, COVID testing, urine, urine culture and possible IV abx, hydration, pain control, possible admission/further tx and  eval pending results. She agrees to seek care and prefers to go [facility]. Staff notified ER triage of the case. Decline EMS transport - reports husband will take her now.     I discussed the assessment and treatment plan with the patient. The patient was provided an opportunity to ask questions and all were answered. The patient agreed with the plan and demonstrated an understanding of the instructions.   The patient was advised to call back or seek an in-person evaluation if the symptoms worsen or if the condition fails to improve as anticipated.   Lucretia Kern, DO

## 2019-11-26 ENCOUNTER — Other Ambulatory Visit: Payer: Self-pay | Admitting: *Deleted

## 2019-11-26 MED ORDER — ALBUTEROL SULFATE HFA 108 (90 BASE) MCG/ACT IN AERS: INHALATION_SPRAY | RESPIRATORY_TRACT | 0 refills | Status: DC

## 2019-11-26 NOTE — Telephone Encounter (Signed)
Rx done. 

## 2020-01-02 ENCOUNTER — Other Ambulatory Visit: Payer: Self-pay | Admitting: Endocrinology

## 2020-01-02 ENCOUNTER — Telehealth: Payer: Self-pay | Admitting: Endocrinology

## 2020-01-02 NOTE — Telephone Encounter (Signed)
Patient called and scheduled appointment to see Dr Dwyane Dee - is requesting a refill for her Liothyronine be sent to [name]

## 2020-01-03 ENCOUNTER — Other Ambulatory Visit: Payer: Self-pay

## 2020-01-03 MED ORDER — LIOTHYRONINE SODIUM 5 MCG PO TABS: 10.0000 ug | ORAL_TABLET | Freq: Every day | ORAL | 0 refills | Status: DC

## 2020-01-03 MED FILL — LIOTHYRONINE SODIUM 5 MCG T: 5 | 30 days supply | Qty: 60 | Fill #0

## 2020-01-03 NOTE — Telephone Encounter (Signed)
Patient has been out of medication for a several days and was told we would refill when appointment made. She did this yesterday and the RX has not been sent yet.  Is it possible for someone to send it today so that she does not have to go the next 3-4 days without?  Pharmacy to change to [facility] on [address]

## 2020-01-03 NOTE — Telephone Encounter (Signed)
30 day supply sent, not sure why it was approved yesterday and then denied, but note added for pharmacy no further refills will be given without appointment.

## 2020-01-27 ENCOUNTER — Ambulatory Visit: Payer: No Typology Code available for payment source | Admitting: Endocrinology

## 2020-02-05 ENCOUNTER — Other Ambulatory Visit: Payer: Self-pay | Admitting: Endocrinology

## 2020-02-05 MED FILL — LIOTHYRONINE SODIUM 5 MCG T: 5 | 30 days supply | Qty: 60 | Fill #0

## 2020-02-18 ENCOUNTER — Other Ambulatory Visit: Payer: Self-pay | Admitting: Obstetrics and Gynecology

## 2020-02-18 DIAGNOSIS — Z1231 Encounter for screening mammogram for malignant neoplasm of breast: Secondary | ICD-10-CM

## 2020-02-20 ENCOUNTER — Other Ambulatory Visit (INDEPENDENT_AMBULATORY_CARE_PROVIDER_SITE_OTHER): Payer: No Typology Code available for payment source

## 2020-02-20 ENCOUNTER — Other Ambulatory Visit: Payer: Self-pay | Admitting: Endocrinology

## 2020-02-20 ENCOUNTER — Other Ambulatory Visit: Payer: Self-pay

## 2020-02-20 DIAGNOSIS — E039 Hypothyroidism, unspecified: Secondary | ICD-10-CM | POA: Diagnosis not present

## 2020-02-20 LAB — T4, FREE: Free T4: 0.99 ng/dL (ref 0.60–1.60)

## 2020-02-20 LAB — T3, FREE: T3, Free: 3.9 pg/mL (ref 2.3–4.2)

## 2020-02-20 LAB — TSH: TSH: 1.19 u[IU]/mL (ref 0.35–4.50)

## 2020-02-21 ENCOUNTER — Telehealth: Payer: Self-pay | Admitting: General Practice

## 2020-02-21 MED ORDER — ALBUTEROL SULFATE HFA 108 (90 BASE) MCG/ACT IN AERS: INHALATION_SPRAY | RESPIRATORY_TRACT | 0 refills | Status: DC

## 2020-02-21 MED FILL — ALBUTEROL SULFATE HFA 108 (: 108 (90 BAS | 50 days supply | Qty: 18 | Fill #0

## 2020-02-21 NOTE — Telephone Encounter (Signed)
Rx done. 

## 2020-02-21 NOTE — Telephone Encounter (Signed)
[place], [place] Phone:  [phone]  Fax:  [phone]     albuterol (VENTOLIN [facility] 108 [phone] Base) MCG/ACT inhaler  Patient is scheduled to see Dr. Maudie Mercury virtually on Tuesday at 11 AM. The patient requested to have the virtual visit with Dr. Maudie Mercury even though she is not her PCP any longer and she hasn't established with another provider.   She was hoping that Dr. Maudie Mercury can send this Rx in for her to use over the weekend till her appointment on Tuesday.  Please advise

## 2020-02-21 NOTE — Addendum Note (Signed)
Addended by: Agnes Lawrence on: 02/21/2020 01:51 PM   Modules accepted: Orders

## 2020-02-24 ENCOUNTER — Ambulatory Visit: Payer: No Typology Code available for payment source | Admitting: Endocrinology

## 2020-02-25 ENCOUNTER — Telehealth (INDEPENDENT_AMBULATORY_CARE_PROVIDER_SITE_OTHER): Payer: No Typology Code available for payment source | Admitting: Family Medicine

## 2020-02-25 ENCOUNTER — Telehealth: Payer: Self-pay | Admitting: *Deleted

## 2020-02-25 ENCOUNTER — Encounter: Payer: Self-pay | Admitting: *Deleted

## 2020-02-25 ENCOUNTER — Encounter: Payer: Self-pay | Admitting: Family Medicine

## 2020-02-25 VITALS — Wt 209.0 lb

## 2020-02-25 DIAGNOSIS — G8929 Other chronic pain: Secondary | ICD-10-CM

## 2020-02-25 DIAGNOSIS — R5383 Other fatigue: Secondary | ICD-10-CM | POA: Diagnosis not present

## 2020-02-25 DIAGNOSIS — R110 Nausea: Secondary | ICD-10-CM | POA: Diagnosis not present

## 2020-02-25 NOTE — Telephone Encounter (Signed)
-----   Message from Lucretia Kern, DO sent at 02/25/2020  1:07 PM EDT ----- Rometta Emery,  Could you copy this letter to official letterhead for [facility], print and let her know when ready? Also, she needs TOC visit with Dr. Ethlyn Gallery in 1-3 months. Thanks!    To whom it may concern,  Maria [name], DOB 11/21/2070, was seen for a visit today 02/25/2020. She reports a history of a number of symptoms including muscle and joint pains, fatigue and nausea that have been going on for over 10 years. She has a history of hypothyroidism managed by her specialist. She has seen a number of specialist for these issues, including Rheumatology, Endocrinology, Gastroenterology and Gynecology recently, yet has not found any relief. She would like to pursue evaluation and management through an Integrative Medicine Specialist. I feel that this would be reasonable and hope that she can find some relief.   Sincerely,   Dr. Colin Benton, DO

## 2020-02-25 NOTE — Progress Notes (Signed)
Virtual Visit via Video Note  I connected with Maria [name]  on 02/25/20 at 11:00 AM EDT by a video enabled telemedicine application and verified that I am speaking with the correct person using two identifiers.  Location patient: home, [place] Location provider:work or home office Persons participating in the virtual visit: patient, provider  I discussed the limitations of evaluation and management by telemedicine and the availability of in person appointments. The patient expressed understanding and agreed to proceed.    HPI:  Acute visit for request for letter for her insurance company. She reports she has had chronic pain in her arms and legs throughout bilaterally - muscles and joints, fatigue and nausea ongoing for over 10 years. 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 She has seen several specialists over the years, including rheumatology, GI, gyn and endocrinology recently, but nobody has been able to help. She would like to see an integrative doctor and sought one out in [place]. For insurance purposes she needs a letter from a doctor to show that these services may be beneficial. She currently does not have a PCP, but plans to schedule a PCP visit.   ROS: See pertinent positives and negatives per HPI.  Past Medical History:  Diagnosis Date  . Asthma    followed by pcp  . Generalized anxiety disorder   . Hiatal hernia   . History of anemia   . History of pancreatitis    2017 due to gallstones-- resolved  . Hypothyroidism    endocrinologist-- dr Dwyane Dee  . IBS (irritable bowel syndrome)   . Ovarian cyst    Bilateral  . Seasonal allergies   . Wears glasses     Past Surgical History:  Procedure Laterality Date  . ABDOMINAL HYSTERECTOMY  2017  . BREAST LUMPECTOMY WITH RADIOACTIVE SEED LOCALIZATION Left 01/22/2016   Procedure: LEFT BREAST LUMPECTOMY WITH RADIOACTIVE SEED LOCALIZATION;  Surgeon: Fanny Skates, MD;  Location: [facility];  Service: General;  Laterality: Left;  . CESAREAN SECTION  x3  last one 2005   BILATERAL TUBAL LIGATION WITH LAST C/S  . CHOLECYSTECTOMY N/A 03/15/2016   Procedure: LAPAROSCOPIC CHOLECYSTECTOMY WITH INTRAOPERATIVE CHOLANGIOGRAM;  Surgeon: Jackolyn Confer, MD;  [facility];  Service: General;  Laterality: N/A;  . COLONOSCOPY WITH ESOPHAGOGASTRODUODENOSCOPY (EGD)  06/18/2019  . CYSTO N/A 10/06/2015   Procedure: CYSTOSCOPY;  Surgeon: Nunzio Cobbs, MD;  Location: [facility];  Service: Gynecology;  Laterality: N/A;    Family History  Problem Relation Age of Onset  . Breast cancer Mother 25       s/p lump and rad  . Skin cancer Mother        non-melanoma  . Thyroid disease Mother   . Skin cancer Father        non-melanoma  . Hypertension Father   . Hyperlipidemia Father   . Breast cancer Paternal Grandmother        dx 4s; s/p mastectomy  . Cervical cancer Maternal Grandmother   . Arthritis Maternal Grandmother   . CVA Maternal Grandmother   . Stroke Maternal Grandmother 80  . Heart attack Maternal Grandfather        d. 25s  . Colon cancer Paternal Grandfather 33  . Rectal cancer Maternal Uncle 54  . Aortic aneurysm Maternal Uncle 53  . Uterine cancer Other        maternal great grandmother (MGM's mother)  . Stroke Other   . Cancer  Other        maternal great grandfather (MGM's father); NOS cancer  . Breast cancer Other        maternal great grandmother (MGF's mother)  . Colon cancer Other        maternal great grandfather (MGF's father)  . Heart attack Other   . Colon cancer Other        paternal great grandfather (PGF's father)  . Heart attack Other   . Stomach cancer Neg Hx   . Pancreatic cancer Neg Hx   . Esophageal cancer Neg Hx        Current Outpatient Medications:  .  albuterol (VENTOLIN HFA) 108 (90 Base) MCG/ACT inhaler, INHALE 1 PUFF INTO THE LUNGS EVERY 6 (SIX) HOURS AS NEEDED FOR WHEEZING OR SHORTNESS OF BREATH., Disp: 18 g,  Rfl: 0 .  levothyroxine (SYNTHROID) 88 MCG tablet, Take 1 tablet by mouth once daily. **Must make appt for further refills.**, Disp: 30 tablet, Rfl: 0 .  liothyronine (CYTOMEL) 5 MCG tablet, TAKE 2 TABLETS (10 MCG TOTAL) BY MOUTH DAILY., Disp: 60 tablet, Rfl: 2 .  loratadine (CLARITIN) 10 MG tablet, Take 1 tablet (10 mg total) by mouth daily., Disp: 90 tablet, Rfl: 0 .  naproxen (NAPROSYN) 500 MG tablet, Take 1 tablet (500 mg total) by mouth 2 (two) times daily., Disp: 30 tablet, Rfl: 0 .  ondansetron (ZOFRAN) 4 MG tablet, Take 1 tablet (4 mg total) by mouth every 8 (eight) hours as needed for nausea or vomiting., Disp: 4 tablet, Rfl: 0 .  oxyCODONE-acetaminophen (PERCOCET/ROXICET) 5-325 MG tablet, Take 1 tablet by mouth every 8 (eight) hours as needed for severe pain., Disp: 6 tablet, Rfl: 0 .  tamsulosin (FLOMAX) 0.4 MG CAPS capsule, Take 1 capsule (0.4 mg total) by mouth daily., Disp: 10 capsule, Rfl: 0 .  venlafaxine XR (EFFEXOR-XR) 150 MG 24 hr capsule, Take once daily, Disp: 90 capsule, Rfl: 1  EXAM:  VITALS per patient if applicable:  GENERAL: alert, oriented, appears well and in no acute distress  HEENT: atraumatic, conjunttiva clear, no obvious abnormalities on inspection of external nose and ears  NECK: normal movements of the head and neck  LUNGS: on inspection no signs of respiratory distress, breathing rate appears normal, no obvious gross SOB, gasping or wheezing  CV: no obvious cyanosis  MS: moves all visible extremities without noticeable abnormality  PSYCH/NEURO: pleasant and cooperative, no obvious depression or anxiety, speech and thought processing grossly intact  ASSESSMENT AND PLAN:  Discussed the following assessment and plan:  Other chronic pain  Other fatigue  Nausea  -we discussed possible serious and likely etiologies, options for evaluation and workup, limitations of telemedicine visit vs in person visit, treatment, treatment risks and precautions. Pt  prefers to treat via telemedicine empirically rather then risking or undertaking an in person visit at this moment. She already saw the integrative specialist and is very excited and hopeful that they seem to be able to help her. Sent message to clinical staff regarding the letter to print for her. Advised follow up with her rheumatologist and she agrees to call and also follow up with a new PCP in 1-2 months. Advised of options within [place] and she agrees to set this up. Patient agrees to seek prompt in person care if worsening, new symptoms arise, or if is not improving with treatment. Spent >20 minutes on this visit.    I discussed the assessment and treatment plan with the patient. The patient was provided an  opportunity to ask questions and all were answered. The patient agreed with the plan and demonstrated an understanding of the instructions.   The patient was advised to call back or seek an in-person evaluation if the symptoms worsen or if the condition fails to improve as anticipated.   Lucretia Kern, DO

## 2020-02-25 NOTE — Telephone Encounter (Signed)
Spoke with the pt and informed her the letter was completed and left at the front desk for pick up. Patient requested this be sent via email and I informed her we cannot send via email and she should be able to view this from her Mychart.  Transfer of care appt scheduled for 9/27.

## 2020-02-25 NOTE — Telephone Encounter (Signed)
Per Jerene Pitch patient walked in requesting a copy of the letter. Printed copy was given.

## 2020-03-30 ENCOUNTER — Other Ambulatory Visit: Payer: Self-pay | Admitting: Family Medicine

## 2020-03-31 ENCOUNTER — Encounter: Payer: Self-pay | Admitting: Family Medicine

## 2020-03-31 MED ORDER — VENLAFAXINE HCL ER 150 MG PO CP24: ORAL_CAPSULE | ORAL | 0 refills | Status: DC

## 2020-04-06 ENCOUNTER — Ambulatory Visit: Payer: No Typology Code available for payment source

## 2020-04-14 ENCOUNTER — Ambulatory Visit: Payer: No Typology Code available for payment source

## 2020-04-30 ENCOUNTER — Ambulatory Visit: Payer: No Typology Code available for payment source

## 2020-05-06 ENCOUNTER — Ambulatory Visit
Admission: RE | Admit: 2020-05-06 | Discharge: 2020-05-06 | Disposition: A | Discharge status: Home / Self Care | Payer: No Typology Code available for payment source | Source: Ambulatory Visit | Attending: Obstetrics and Gynecology | Admitting: Obstetrics and Gynecology

## 2020-05-06 ENCOUNTER — Other Ambulatory Visit: Payer: Self-pay

## 2020-05-06 DIAGNOSIS — Z1231 Encounter for screening mammogram for malignant neoplasm of breast: Secondary | ICD-10-CM

## 2020-05-11 ENCOUNTER — Ambulatory Visit: Payer: No Typology Code available for payment source | Admitting: Family Medicine

## 2020-05-11 NOTE — Progress Notes (Deleted)
Maria [name] DOB: [birth year] Encounter date: 05/11/2020  This isa 49 y.o. female who presents to establish care. No chief complaint on file.   History of present illness:  ***   Past Medical History:  Diagnosis Date  . Asthma    followed by pcp  . Generalized anxiety disorder   . Hiatal hernia   . History of anemia   . History of pancreatitis    2017 due to gallstones-- resolved  . Hypothyroidism    endocrinologist-- dr Dwyane Dee  . IBS (irritable bowel syndrome)   . Ovarian cyst    Bilateral  . Seasonal allergies   . Wears glasses    Past Surgical History:  Procedure Laterality Date  . ABDOMINAL HYSTERECTOMY  2017  . BREAST EXCISIONAL BIOPSY Left 2017   benign  . BREAST LUMPECTOMY WITH RADIOACTIVE SEED LOCALIZATION Left 01/22/2016   Procedure: LEFT BREAST LUMPECTOMY WITH RADIOACTIVE SEED LOCALIZATION;  Surgeon: Fanny Skates, MD;  Location: [facility];  Service: General;  Laterality: Left;  . CESAREAN SECTION  x3  last one 2005   BILATERAL TUBAL LIGATION WITH LAST C/S  . CHOLECYSTECTOMY N/A 03/15/2016   Procedure: LAPAROSCOPIC CHOLECYSTECTOMY WITH INTRAOPERATIVE CHOLANGIOGRAM;  Surgeon: Jackolyn Confer, MD;  [facility];  Service: General;  Laterality: N/A;  . COLONOSCOPY WITH ESOPHAGOGASTRODUODENOSCOPY (EGD)  06/18/2019  . CYSTO N/A 10/06/2015   Procedure: CYSTOSCOPY;  Surgeon: Nunzio Cobbs, MD;  Location: [facility];  Service: Gynecology;  Laterality: N/A;   No Known Allergies No outpatient medications have been marked as taking for the 05/11/20 encounter (Appointment) with Caren Macadam, MD.   Social History   Tobacco Use  . Smoking status: Current Some Day Smoker    Years: 25.00    Types: Cigarettes  . Smokeless tobacco: Never Used  . Tobacco comment: 08-07-2019 social  Substance Use Topics  . Alcohol use: Yes    Comment: socially   Family History  Problem Relation Age of Onset  . Breast cancer Mother 32       s/p lump  and rad  . Skin cancer Mother        non-melanoma  . Thyroid disease Mother   . Skin cancer Father        non-melanoma  . Hypertension Father   . Hyperlipidemia Father   . Breast cancer Paternal Grandmother        dx 9s; s/p mastectomy  . Cervical cancer Maternal Grandmother   . Arthritis Maternal Grandmother   . CVA Maternal Grandmother   . Stroke Maternal Grandmother 80  . Heart attack Maternal Grandfather        d. 70s  . Colon cancer Paternal Grandfather 56  . Rectal cancer Maternal Uncle 54  . Aortic aneurysm Maternal Uncle 53  . Uterine cancer Other        maternal great grandmother (MGM's mother)  . Stroke Other   . Cancer Other        maternal great grandfather (MGM's father); NOS cancer  . Breast cancer Other        maternal great grandmother (MGF's mother)  . Colon cancer Other        maternal great grandfather (MGF's father)  . Heart attack Other   . Colon cancer Other        paternal great grandfather (PGF's father)  . Heart attack Other   . Stomach cancer Neg Hx   . Pancreatic cancer Neg Hx   . Esophageal cancer Neg  Hx      Review of Systems  Objective:  LMP 09/26/2015 (Exact Date)       BP Readings from Last 3 Encounters:  10/30/19 130/76  08/26/19 126/78  08/13/19 (!) 146/84   Wt Readings from Last 3 Encounters:  02/25/20 209 lb (94.8 kg)  08/26/19 213 lb (96.6 kg)  08/13/19 214 lb 8 oz (97.3 kg)    Physical Exam  Assessment/Plan:  There are no diagnoses linked to this encounter.  No follow-ups on file.  Micheline Rough, MD

## 2020-05-15 ENCOUNTER — Ambulatory Visit
Admission: RE | Admit: 2020-05-15 | Discharge: 2020-05-15 | Disposition: A | Discharge status: Home / Self Care | Payer: No Typology Code available for payment source | Source: Ambulatory Visit | Attending: Emergency Medicine | Admitting: Emergency Medicine

## 2020-05-15 ENCOUNTER — Other Ambulatory Visit: Payer: Self-pay

## 2020-05-15 VITALS — BP 117/87 | HR 75 | Temp 98.6°F | Resp 17

## 2020-05-15 DIAGNOSIS — H66001 Acute suppurative otitis media without spontaneous rupture of ear drum, right ear: Secondary | ICD-10-CM

## 2020-05-15 MED ORDER — AMOXICILLIN-POT CLAVULANATE 875-125 MG PO TABS: 1.0000 | ORAL_TABLET | Freq: Two times a day (BID) | ORAL | 0 refills | Status: AC

## 2020-05-15 NOTE — ED Provider Notes (Signed)
[place]   [id number] 05/15/20 Arrival Time: 1840  CC: EAR PAIN  SUBJECTIVE: History from: patient.  Maria [name] is a 49 y.o. female who presents with of bilateral ear pain and itching x 2 month.  RT ear has become more painful over the past day.  Denies a precipitating event, such as swimming or wearing ear plugs.  Patient states the pain is intermittent and achy in character.  Patient has tried cleaning with peroxide without relief.  Symptoms are made worse with lying down.  Denies similar symptoms in the past.  Denies fever, chills, fatigue, sinus pain, rhinorrhea, ear discharge, sore throat, SOB, wheezing, chest pain, nausea, changes in bowel or bladder habits.    ROS: As per HPI.  All other pertinent ROS negative.     Past Medical History:  Diagnosis Date  . Asthma    followed by pcp  . Generalized anxiety disorder   . Hiatal hernia   . History of anemia   . History of pancreatitis    2017 due to gallstones-- resolved  . Hypothyroidism    endocrinologist-- dr Dwyane Dee  . IBS (irritable bowel syndrome)   . Ovarian cyst    Bilateral  . Seasonal allergies   . Wears glasses    Past Surgical History:  Procedure Laterality Date  . ABDOMINAL HYSTERECTOMY  2017  . BREAST EXCISIONAL BIOPSY Left 2017   benign  . BREAST LUMPECTOMY WITH RADIOACTIVE SEED LOCALIZATION Left 01/22/2016   Procedure: LEFT BREAST LUMPECTOMY WITH RADIOACTIVE SEED LOCALIZATION;  Surgeon: Fanny Skates, MD;  Location: [facility];  Service: General;  Laterality: Left;  . CESAREAN SECTION  x3  last one 2005   BILATERAL TUBAL LIGATION WITH LAST C/S  . CHOLECYSTECTOMY N/A 03/15/2016   Procedure: LAPAROSCOPIC CHOLECYSTECTOMY WITH INTRAOPERATIVE CHOLANGIOGRAM;  Surgeon: Jackolyn Confer, MD;  Location: [facility];  Service: General;  Laterality: N/A;  . COLONOSCOPY WITH ESOPHAGOGASTRODUODENOSCOPY (EGD)  06/18/2019  . CYSTO N/A 10/06/2015   Procedure: CYSTOSCOPY;  Surgeon: Nunzio Cobbs, MD;  Location: [facility];  Service: Gynecology;  Laterality: N/A;   No Known Allergies No current facility-administered medications on file prior to encounter.   Current Outpatient Medications on File Prior to Encounter  Medication Sig Dispense Refill  . albuterol (VENTOLIN HFA) 108 (90 Base) MCG/ACT inhaler INHALE 1 PUFF INTO THE LUNGS EVERY 6 HOURS AS NEEDED FOR WHEEZING OR SHORTNESS OF BREATH (NEEDS APPOINTMENT WITH NEW PCP) 18 g 0  . levothyroxine (SYNTHROID) 88 MCG tablet Take 1 tablet by mouth once daily. **Must make appt for further refills.** 30 tablet 0  . liothyronine (CYTOMEL) 5 MCG tablet TAKE 2 TABLETS (10 MCG TOTAL) BY MOUTH DAILY. 60 tablet 2  . loratadine (CLARITIN) 10 MG tablet Take 1 tablet (10 mg total) by mouth daily. 90 tablet 0  . naproxen (NAPROSYN) 500 MG tablet Take 1 tablet (500 mg total) by mouth 2 (two) times daily. 30 tablet 0  . ondansetron (ZOFRAN) 4 MG tablet Take 1 tablet (4 mg total) by mouth every 8 (eight) hours as needed for nausea or vomiting. 4 tablet 0  . oxyCODONE-acetaminophen (PERCOCET/ROXICET) 5-325 MG tablet Take 1 tablet by mouth every 8 (eight) hours as needed for severe pain. 6 tablet 0  . tamsulosin (FLOMAX) 0.4 MG CAPS capsule Take 1 capsule (0.4 mg total) by mouth daily. 10 capsule 0  . venlafaxine XR (EFFEXOR-XR) 150 MG 24 hr capsule Take once daily 90 capsule 0   Social History  Socioeconomic History  . Marital status: Married    Spouse name: Elberta Fortis Macbride  . Number of children: 3  . Years of education: Not on file  . Highest education level: Not on file  Occupational History  . Occupation: Government social research officer  Tobacco Use  . Smoking status: Current Some Day Smoker    Years: 25.00    Types: Cigarettes  . Smokeless tobacco: Never Used  . Tobacco comment: 08-07-2019 social  Vaping Use  . Vaping Use: Never used  Substance and Sexual Activity  . Alcohol use: Yes    Comment: socially  . Drug use: Never  .  Sexual activity: Yes    Partners: Male    Birth control/protection: Surgical    Comment: Tubal  Other Topics Concern  . Not on file  Social History Narrative  . Not on file   Social Determinants of Health   Financial Resource Strain:   . Difficulty of Paying Living Expenses: Not on file  Food Insecurity:   . Worried About Charity fundraiser in the Last Year: Not on file  . Ran Out of Food in the Last Year: Not on file  Transportation Needs:   . Lack of Transportation (Medical): Not on file  . Lack of Transportation (Non-Medical): Not on file  Physical Activity:   . Days of Exercise per Week: Not on file  . Minutes of Exercise per Session: Not on file  Stress:   . Feeling of Stress : Not on file  Social Connections:   . Frequency of Communication with Friends and Family: Not on file  . Frequency of Social Gatherings with Friends and Family: Not on file  . Attends Religious Services: Not on file  . Active Member of Clubs or Organizations: Not on file  . Attends Archivist Meetings: Not on file  . Marital Status: Not on file  Intimate Partner Violence:   . Fear of Current or Ex-Partner: Not on file  . Emotionally Abused: Not on file  . Physically Abused: Not on file  . Sexually Abused: Not on file   Family History  Problem Relation Age of Onset  . Breast cancer Mother 63       s/p lump and rad  . Skin cancer Mother        non-melanoma  . Thyroid disease Mother   . Skin cancer Father        non-melanoma  . Hypertension Father   . Hyperlipidemia Father   . Breast cancer Paternal Grandmother        dx 41s; s/p mastectomy  . Cervical cancer Maternal Grandmother   . Arthritis Maternal Grandmother   . CVA Maternal Grandmother   . Stroke Maternal Grandmother 80  . Heart attack Maternal Grandfather        d. 44s  . Colon cancer Paternal Grandfather 56  . Rectal cancer Maternal Uncle 54  . Aortic aneurysm Maternal Uncle 53  . Uterine cancer Other         maternal great grandmother (MGM's mother)  . Stroke Other   . Cancer Other        maternal great grandfather (MGM's father); NOS cancer  . Breast cancer Other        maternal great grandmother (MGF's mother)  . Colon cancer Other        maternal great grandfather (MGF's father)  . Heart attack Other   . Colon cancer Other        paternal  great grandfather (PGF's father)  . Heart attack Other   . Stomach cancer Neg Hx   . Pancreatic cancer Neg Hx   . Esophageal cancer Neg Hx     OBJECTIVE:  Vitals:   05/15/20 1901  BP: 117/87  Pulse: 75  Resp: 17  Temp: 98.6 F (37 C)  TempSrc: Oral  SpO2: 96%    General appearance: alert; well-appearing, nontoxic; speaking in full sentences and tolerating own secretions HEENT: NCAT; Ears: EACs clear, LT TM pearly gray, RT TM dusky; Eyes: PERRL.  EOM grossly intact.Nose: nares patent without rhinorrhea, Throat: oropharynx clear, tonsils non erythematous or enlarged, uvula midline  Neck: supple without LAD Lungs: unlabored respirations, symmetrical air entry; cough: absent; no respiratory distress; CTAB Heart: regular rate and rhythm.  Skin: warm and dry Psychological: alert and cooperative; normal mood and affect   ASSESSMENT & PLAN:  1. Non-recurrent acute suppurative otitis media of right ear without spontaneous rupture of tympanic membrane     Meds ordered this encounter  Medications  . amoxicillin-clavulanate (AUGMENTIN) 875-125 MG tablet    Sig: Take 1 tablet by mouth every 12 (twelve) hours for 10 days.    Dispense:  20 tablet    Refill:  0    Order Specific Question:   Supervising Provider    Answer:   Raylene Everts [[id number]]    Rest and drink plenty of fluids Prescribed augmentin.  Take as directed and to completion Take medications as directed and to completion Continue to use OTC ibuprofen and/ or tylenol as needed for pain control Follow up with PCP if symptoms persists Return here or go to the ER if you have  any new or worsening symptoms   Reviewed expectations re: course of current medical issues. Questions answered. Outlined signs and symptoms indicating need for more acute intervention. Patient verbalized understanding. After Visit Summary given.         Lestine Box, PA-C 05/15/20 1922

## 2020-05-15 NOTE — ED Triage Notes (Signed)
Pt presents with bilateral ear pain for past few days

## 2020-05-15 NOTE — Discharge Instructions (Signed)
Rest and drink plenty of fluids Prescribed augmentin.  Take as directed and to completion Take medications as directed and to completion Continue to use OTC ibuprofen and/ or tylenol as needed for pain control Follow up with PCP if symptoms persists Return here or go to the ER if you have any new or worsening symptoms

## 2020-06-29 ENCOUNTER — Ambulatory Visit: Payer: No Typology Code available for payment source | Admitting: Family Medicine

## 2020-06-29 NOTE — Progress Notes (Deleted)
Maria [name] DOB: 03/09/1971 Encounter date: 06/29/2020  This isa 49 y.o. female who presents to establish care. No chief complaint on file.   History of present illness: She was in the emergency room last month with ear infection. *** Anxiety: Seasonal allergies/mild intermittent asthma: Insulin resistance: Hypothyroidism:  Past Medical History:  Diagnosis Date  . Asthma    followed by pcp  . Generalized anxiety disorder   . Hiatal hernia   . History of anemia   . History of pancreatitis    2017 due to gallstones-- resolved  . Hypothyroidism    endocrinologist-- dr Dwyane Dee  . IBS (irritable bowel syndrome)   . Ovarian cyst    Bilateral  . Seasonal allergies   . Wears glasses    Past Surgical History:  Procedure Laterality Date  . ABDOMINAL HYSTERECTOMY  2017  . BREAST EXCISIONAL BIOPSY Left 2017   benign  . BREAST LUMPECTOMY WITH RADIOACTIVE SEED LOCALIZATION Left 01/22/2016   Procedure: LEFT BREAST LUMPECTOMY WITH RADIOACTIVE SEED LOCALIZATION;  Surgeon: Fanny Skates, MD;  Location: [facility];  Service: General;  Laterality: Left;  . CESAREAN SECTION  x3  last one 2005   BILATERAL TUBAL LIGATION WITH LAST C/S  . CHOLECYSTECTOMY N/A 03/15/2016   Procedure: LAPAROSCOPIC CHOLECYSTECTOMY WITH INTRAOPERATIVE CHOLANGIOGRAM;  Surgeon: Jackolyn Confer, MD;  Location: [facility];  Service: General;  Laterality: N/A;  . COLONOSCOPY WITH ESOPHAGOGASTRODUODENOSCOPY (EGD)  06/18/2019  . CYSTO N/A 10/06/2015   Procedure: CYSTOSCOPY;  Surgeon: Nunzio Cobbs, MD;  Location: [facility];  Service: Gynecology;  Laterality: N/A;   No Known Allergies No outpatient medications have been marked as taking for the 06/29/20 encounter (Appointment) with Caren Macadam, MD.   Social History   Tobacco Use  . Smoking status: Current Some Day Smoker    Years: 25.00    Types: Cigarettes  . Smokeless tobacco: Never Used  . Tobacco comment: 08-07-2019 social   Substance Use Topics  . Alcohol use: Yes    Comment: socially   Family History  Problem Relation Age of Onset  . Breast cancer Mother 74       s/p lump and rad  . Skin cancer Mother        non-melanoma  . Thyroid disease Mother   . Skin cancer Father        non-melanoma  . Hypertension Father   . Hyperlipidemia Father   . Breast cancer Paternal Grandmother        dx 61s; s/p mastectomy  . Cervical cancer Maternal Grandmother   . Arthritis Maternal Grandmother   . CVA Maternal Grandmother   . Stroke Maternal Grandmother 80  . Heart attack Maternal Grandfather        d. 73s  . Colon cancer Paternal Grandfather 69  . Rectal cancer Maternal Uncle 54  . Aortic aneurysm Maternal Uncle 53  . Uterine cancer Other        maternal great grandmother (MGM's mother)  . Stroke Other   . Cancer Other        maternal great grandfather (MGM's father); NOS cancer  . Breast cancer Other        maternal great grandmother (MGF's mother)  . Colon cancer Other        maternal great grandfather (MGF's father)  . Heart attack Other   . Colon cancer Other        paternal great grandfather (PGF's father)  . Heart attack Other   .  Stomach cancer Neg Hx   . Pancreatic cancer Neg Hx   . Esophageal cancer Neg Hx      Review of Systems  Objective:  LMP 09/26/2015 (Exact Date)       BP Readings from Last 3 Encounters:  05/15/20 117/87  10/30/19 130/76  08/26/19 126/78   Wt Readings from Last 3 Encounters:  02/25/20 209 lb (94.8 kg)  08/26/19 213 lb (96.6 kg)  08/13/19 214 lb 8 oz (97.3 kg)    Physical Exam  Assessment/Plan:  There are no diagnoses linked to this encounter.  No follow-ups on file.  Micheline Rough, MD

## 2020-07-21 ENCOUNTER — Other Ambulatory Visit: Payer: Self-pay | Admitting: Family Medicine

## 2020-07-21 ENCOUNTER — Other Ambulatory Visit: Payer: Self-pay | Admitting: Registered Nurse

## 2020-08-12 ENCOUNTER — Other Ambulatory Visit: Payer: Self-pay | Admitting: Family Medicine

## 2020-08-12 ENCOUNTER — Other Ambulatory Visit: Payer: Self-pay

## 2020-08-12 ENCOUNTER — Ambulatory Visit (INDEPENDENT_AMBULATORY_CARE_PROVIDER_SITE_OTHER): Payer: No Typology Code available for payment source | Admitting: Family Medicine

## 2020-08-12 ENCOUNTER — Encounter: Payer: Self-pay | Admitting: Family Medicine

## 2020-08-12 VITALS — BP 100/72 | HR 83 | Temp 98.0°F | Ht 59.75 in | Wt 193.0 lb

## 2020-08-12 DIAGNOSIS — H6093 Unspecified otitis externa, bilateral: Secondary | ICD-10-CM | POA: Diagnosis not present

## 2020-08-12 DIAGNOSIS — R4189 Other symptoms and signs involving cognitive functions and awareness: Secondary | ICD-10-CM

## 2020-08-12 DIAGNOSIS — E038 Other specified hypothyroidism: Secondary | ICD-10-CM

## 2020-08-12 DIAGNOSIS — R5383 Other fatigue: Secondary | ICD-10-CM

## 2020-08-12 DIAGNOSIS — R110 Nausea: Secondary | ICD-10-CM

## 2020-08-12 MED ORDER — VENLAFAXINE HCL ER 150 MG PO CP24: ORAL_CAPSULE | ORAL | 1 refills | Status: DC

## 2020-08-12 MED ORDER — NEOMYCIN-POLYMYXIN-HC 3.5-10000-1 OT SUSP: 3.0000 [drp] | Freq: Four times a day (QID) | OTIC | 0 refills | Status: DC

## 2020-08-12 MED ORDER — THYROID 60 MG PO TABS: 60.0000 mg | ORAL_TABLET | Freq: Every day | ORAL | Status: DC

## 2020-08-12 MED ORDER — ALBUTEROL SULFATE HFA 108 (90 BASE) MCG/ACT IN AERS: INHALATION_SPRAY | RESPIRATORY_TRACT | 1 refills | Status: DC

## 2020-08-12 NOTE — Progress Notes (Signed)
Maria [name] DOB: 10/11/1970 Encounter date: 08/12/2020  This isa 49 y.o. female who presents to establish care. Chief Complaint  Patient presents with  . Establish Care    History of present illness: Has been seeing a functional doc in [place] ([facility]) and was switched to np thyroid. Also taking anti-inflammatory. Not seeing rheumatology currently. Has done well since seeing [facility] (Maria [name]) and has lost weight. Goes back in [place] to do more bloodwork through [facility]. Brain fog is very bad ever since dx of hashimotos. She has really felt better since starting NP thyroid.   Has been nauseous 24-7 for years. Like pregnancy nausea. zofran didn't help.   Follow with Dr. Quincy Simmonds for gyn needs.  Follows with dermatology regularly.  Not smoking regularly.    Past Medical History:  Diagnosis Date  . Asthma    followed by pcp  . Generalized anxiety disorder   . Hiatal hernia   . History of anemia   . History of pancreatitis    2017 due to gallstones-- resolved  . Hypothyroidism    endocrinologist-- dr Dwyane Dee  . IBS (irritable bowel syndrome)   . Ovarian cyst    Bilateral  . Seasonal allergies   . Wears glasses    Past Surgical History:  Procedure Laterality Date  . ABDOMINAL HYSTERECTOMY  2017   heavy bleeding; no cancer  . BREAST EXCISIONAL BIOPSY Left 2017   benign  . BREAST LUMPECTOMY WITH RADIOACTIVE SEED LOCALIZATION Left 01/22/2016   Procedure: LEFT BREAST LUMPECTOMY WITH RADIOACTIVE SEED LOCALIZATION;  Surgeon: Fanny Skates, MD;  Location: [facility];  Service: General;  Laterality: Left;  . CESAREAN SECTION  x3  last one 2005   BILATERAL TUBAL LIGATION WITH LAST C/S  . CHOLECYSTECTOMY N/A 03/15/2016   Procedure: LAPAROSCOPIC CHOLECYSTECTOMY WITH INTRAOPERATIVE CHOLANGIOGRAM;  Surgeon: Jackolyn Confer, MD;  Location: [facility];  Service: General;  Laterality: N/A;  . COLONOSCOPY WITH ESOPHAGOGASTRODUODENOSCOPY (EGD)   06/18/2019  . CYSTO N/A 10/06/2015   Procedure: CYSTOSCOPY;  Surgeon: Nunzio Cobbs, MD;  Location: [facility];  Service: Gynecology;  Laterality: N/A;   No Known Allergies Current Meds  Medication Sig  . loratadine (CLARITIN) 10 MG tablet Take 1 tablet (10 mg total) by mouth daily.  . naproxen (NAPROSYN) 500 MG tablet Take 1 tablet (500 mg total) by mouth 2 (two) times daily.  Marland Kitchen neomycin-polymyxin-hydrocortisone (CORTISPORIN) 3.5-10000-1 OTIC suspension Place 3 drops into both ears 4 (four) times daily for 7 days.  Marland Kitchen thyroid (NP THYROID) 60 MG tablet Take 1 tablet (60 mg total) by mouth daily before breakfast.  . [DISCONTINUED] albuterol (VENTOLIN HFA) 108 (90 Base) MCG/ACT inhaler INHALE 1 PUFF BY MOUTH EVERY 6 HOURS AS NEEDED FOR WHEEZING OR SHORTNESS OF BREATH (NEEDS APPOINTMENT WITH NEW PCP)  . [DISCONTINUED] levothyroxine (SYNTHROID) 88 MCG tablet Take 1 tablet by mouth once daily. **Must make appt for further refills.**  . [DISCONTINUED] liothyronine (CYTOMEL) 5 MCG tablet TAKE 2 TABLETS (10 MCG TOTAL) BY MOUTH DAILY.  . [DISCONTINUED] ondansetron (ZOFRAN) 4 MG tablet Take 1 tablet (4 mg total) by mouth every 8 (eight) hours as needed for nausea or vomiting.  . [DISCONTINUED] oxyCODONE-acetaminophen (PERCOCET/ROXICET) 5-325 MG tablet Take 1 tablet by mouth every 8 (eight) hours as needed for severe pain.  . [DISCONTINUED] tamsulosin (FLOMAX) 0.4 MG CAPS capsule Take 1 capsule (0.4 mg total) by mouth daily.  . [DISCONTINUED] venlafaxine XR (EFFEXOR-XR) 150 MG 24 hr capsule TAKE 1 CAPSULE  BY MOUTH ONCE DAILY   Social History   Tobacco Use  . Smoking status: Current Some Day Smoker    Years: 25.00    Types: Cigarettes  . Smokeless tobacco: Never Used  . Tobacco comment: 08-07-2019 social  Substance Use Topics  . Alcohol use: Yes    Comment: socially   Family History  Problem Relation Age of Onset  . Breast cancer Mother 44       s/p lump and rad  . Skin cancer Mother         non-melanoma  . Thyroid disease Mother   . Skin cancer Father        non-melanoma  . Hypertension Father   . Hyperlipidemia Father   . Breast cancer Paternal Grandmother        dx 30s; s/p mastectomy  . Cervical cancer Maternal Grandmother   . Arthritis Maternal Grandmother   . CVA Maternal Grandmother   . Stroke Maternal Grandmother 80  . Heart attack Maternal Grandfather        d. 22s  . Colon cancer Paternal Grandfather 84  . Rectal cancer Maternal Uncle 54  . Aortic aneurysm Maternal Uncle 53  . Uterine cancer Other        maternal great grandmother (MGM's mother)  . Stroke Other   . Cancer Other        maternal great grandfather (MGM's father); NOS cancer  . Breast cancer Other        maternal great grandmother (MGF's mother)  . Colon cancer Other        maternal great grandfather (MGF's father)  . Heart attack Other   . Colon cancer Other        paternal great grandfather (PGF's father)  . Heart attack Other   . Stomach cancer Neg Hx   . Pancreatic cancer Neg Hx   . Esophageal cancer Neg Hx      Review of Systems  Constitutional: Positive for fatigue. Negative for chills and fever.  HENT: Ear pain: itching, irritated.   Respiratory: Negative for cough, chest tightness, shortness of breath and wheezing.   Cardiovascular: Negative for chest pain, palpitations and leg swelling.    Objective:  BP 100/72 (BP Location: Left Arm, Patient Position: Sitting, Cuff Size: Large)   Pulse 83   Temp 98 F (36.7 C) (Oral)   Ht 4' 11.75" (1.518 m)   Wt 193 lb (87.5 kg)   LMP 09/26/2015 (Exact Date)   BMI 38.01 kg/m   Weight: 193 lb (87.5 kg)   BP Readings from Last 3 Encounters:  08/12/20 100/72  05/15/20 117/87  10/30/19 130/76   Wt Readings from Last 3 Encounters:  08/12/20 193 lb (87.5 kg)  02/25/20 209 lb (94.8 kg)  08/26/19 213 lb (96.6 kg)    Physical Exam Constitutional:      General: She is not in acute distress.    Appearance: She is  well-developed.  HENT:     Right Ear: Tympanic membrane normal.     Left Ear: Tympanic membrane normal.     Ears:     Comments: Crusting, edema, erythema bilat ear canals Cardiovascular:     Rate and Rhythm: Normal rate and regular rhythm.     Heart sounds: Normal heart sounds. No murmur heard. No friction rub.  Pulmonary:     Effort: Pulmonary effort is normal. No respiratory distress.     Breath sounds: Normal breath sounds. No wheezing or rales.  Musculoskeletal:  Right lower leg: No edema.     Left lower leg: No edema.  Neurological:     Mental Status: She is alert and oriented to person, place, and time.  Psychiatric:        Behavior: Behavior normal.     Assessment/Plan:  1. Otitis externa of both ears, unspecified chronicity, unspecified type Trial cortisporin. She will let me know if this doesn't work.  2. Other specified hypothyroidism She is following with [facility]. She will cc me on labwork that she has coming up with them. She is hopeful that their assessment and treatment will continue to improve her overall energy.  3. Nausea Ongoing; consider further eval pending review of records from [facility]/treatments.  4. Fatigue, unspecified type See above. She is doing better on NP thyroid.  5. Brain fog See above. [facility] is addressing.   Return in about 3 months (around 11/10/2020) for physical exam.  Micheline Rough, MD

## 2020-08-13 ENCOUNTER — Encounter: Payer: Self-pay | Admitting: Family Medicine

## 2020-11-16 ENCOUNTER — Encounter: Payer: No Typology Code available for payment source | Admitting: Family Medicine

## 2020-11-20 ENCOUNTER — Other Ambulatory Visit: Payer: Self-pay

## 2020-11-20 MED FILL — Thyroid Tab 60 MG (1 Grain): ORAL | 30 days supply | Qty: 30 | Fill #0 | Status: AC

## 2020-11-20 MED FILL — Venlafaxine HCl Cap ER 24HR 150 MG (Base Equivalent): ORAL | 90 days supply | Qty: 90 | Fill #0 | Status: AC

## 2020-11-20 MED FILL — Albuterol Sulfate Inhal Aero 108 MCG/ACT (90MCG Base Equiv): RESPIRATORY_TRACT | 90 days supply | Qty: 54 | Fill #0 | Status: CN

## 2020-11-23 ENCOUNTER — Other Ambulatory Visit: Payer: Self-pay

## 2020-12-02 ENCOUNTER — Other Ambulatory Visit: Payer: Self-pay

## 2020-12-02 MED FILL — Albuterol Sulfate Inhal Aero 108 MCG/ACT (90MCG Base Equiv): RESPIRATORY_TRACT | 90 days supply | Qty: 54 | Fill #0 | Status: AC

## 2020-12-29 ENCOUNTER — Other Ambulatory Visit: Payer: Self-pay

## 2020-12-29 MED ORDER — NP THYROID 60 MG PO TABS
ORAL_TABLET | ORAL | 1 refills | Status: DC
  Filled 2020-12-29: qty 30, 30d supply, fill #0

## 2021-01-01 MED FILL — Albuterol Sulfate Inhal Aero 108 MCG/ACT (90MCG Base Equiv): RESPIRATORY_TRACT | 90 days supply | Qty: 54 | Fill #1 | Status: CN

## 2021-01-04 ENCOUNTER — Other Ambulatory Visit: Payer: Self-pay

## 2021-01-25 ENCOUNTER — Other Ambulatory Visit: Payer: Self-pay

## 2021-01-25 MED ORDER — NP THYROID 60 MG PO TABS
ORAL_TABLET | ORAL | 3 refills | Status: DC
  Filled 2021-01-25: qty 90, 90d supply, fill #0
  Filled 2021-05-06: qty 90, 90d supply, fill #1
  Filled 2021-09-09: qty 90, 90d supply, fill #2

## 2021-02-11 ENCOUNTER — Other Ambulatory Visit: Payer: Self-pay

## 2021-02-11 MED FILL — Venlafaxine HCl Cap ER 24HR 150 MG (Base Equivalent): ORAL | 30 days supply | Qty: 30 | Fill #1 | Status: AC

## 2021-02-11 MED FILL — Albuterol Sulfate Inhal Aero 108 MCG/ACT (90MCG Base Equiv): RESPIRATORY_TRACT | 50 days supply | Qty: 18 | Fill #1 | Status: AC

## 2021-02-12 ENCOUNTER — Other Ambulatory Visit: Payer: Self-pay

## 2021-03-30 ENCOUNTER — Other Ambulatory Visit: Payer: Self-pay

## 2021-03-30 ENCOUNTER — Other Ambulatory Visit: Payer: Self-pay | Admitting: Family Medicine

## 2021-03-30 MED FILL — Albuterol Sulfate Inhal Aero 108 MCG/ACT (90MCG Base Equiv): RESPIRATORY_TRACT | 30 days supply | Qty: 18 | Fill #0 | Status: AC

## 2021-04-02 ENCOUNTER — Other Ambulatory Visit: Payer: Self-pay | Admitting: Family Medicine

## 2021-04-02 ENCOUNTER — Other Ambulatory Visit: Payer: Self-pay

## 2021-04-02 MED ORDER — VENLAFAXINE HCL ER 150 MG PO CP24
ORAL_CAPSULE | Freq: Every day | ORAL | 0 refills | Status: DC
  Filled 2021-04-02: qty 90, 90d supply, fill #0

## 2021-05-06 ENCOUNTER — Other Ambulatory Visit: Payer: Self-pay

## 2021-05-31 ENCOUNTER — Other Ambulatory Visit: Payer: Self-pay

## 2021-05-31 MED FILL — Albuterol Sulfate Inhal Aero 108 MCG/ACT (90MCG Base Equiv): RESPIRATORY_TRACT | 30 days supply | Qty: 18 | Fill #1 | Status: AC

## 2021-07-19 ENCOUNTER — Other Ambulatory Visit: Payer: Self-pay

## 2021-07-19 ENCOUNTER — Other Ambulatory Visit: Payer: Self-pay | Admitting: Family Medicine

## 2021-07-19 MED ORDER — VENLAFAXINE HCL ER 150 MG PO CP24
ORAL_CAPSULE | Freq: Every day | ORAL | 0 refills | Status: DC
Start: 2021-07-19 — End: 2022-08-24
  Filled 2021-07-19: qty 30, 30d supply, fill #0

## 2021-07-26 ENCOUNTER — Other Ambulatory Visit: Payer: Self-pay

## 2021-07-26 MED FILL — Albuterol Sulfate Inhal Aero 108 MCG/ACT (90MCG Base Equiv): RESPIRATORY_TRACT | 30 days supply | Qty: 18 | Fill #2 | Status: AC

## 2021-09-06 ENCOUNTER — Emergency Department (HOSPITAL_COMMUNITY): Payer: No Typology Code available for payment source

## 2021-09-06 ENCOUNTER — Encounter (HOSPITAL_COMMUNITY): Payer: Self-pay

## 2021-09-06 ENCOUNTER — Other Ambulatory Visit: Payer: Self-pay

## 2021-09-06 ENCOUNTER — Emergency Department (HOSPITAL_COMMUNITY)
Admission: EM | Admit: 2021-09-06 | Discharge: 2021-09-07 | Disposition: A | Discharge status: Home / Self Care | Payer: No Typology Code available for payment source | Attending: Emergency Medicine | Admitting: Emergency Medicine

## 2021-09-06 DIAGNOSIS — Z5321 Procedure and treatment not carried out due to patient leaving prior to being seen by health care provider: Secondary | ICD-10-CM | POA: Insufficient documentation

## 2021-09-06 DIAGNOSIS — R1011 Right upper quadrant pain: Secondary | ICD-10-CM | POA: Insufficient documentation

## 2021-09-06 DIAGNOSIS — R6883 Chills (without fever): Secondary | ICD-10-CM | POA: Insufficient documentation

## 2021-09-06 DIAGNOSIS — R110 Nausea: Secondary | ICD-10-CM | POA: Insufficient documentation

## 2021-09-06 LAB — LIPASE, BLOOD: Lipase: 25 U/L (ref 11–51)

## 2021-09-06 LAB — CBC WITH DIFFERENTIAL/PLATELET
Abs Immature Granulocytes: 0.03 10*3/uL (ref 0.00–0.07)
Basophils Absolute: 0 10*3/uL (ref 0.0–0.1)
Basophils Relative: 0 %
Eosinophils Absolute: 0 10*3/uL (ref 0.0–0.5)
Eosinophils Relative: 0 %
HCT: 40.7 % (ref 36.0–46.0)
Hemoglobin: 13.9 g/dL (ref 12.0–15.0)
Immature Granulocytes: 0 %
Lymphocytes Relative: 16 %
Lymphs Abs: 1.6 10*3/uL (ref 0.7–4.0)
MCH: 30.2 pg (ref 26.0–34.0)
MCHC: 34.2 g/dL (ref 30.0–36.0)
MCV: 88.5 fL (ref 80.0–100.0)
Monocytes Absolute: 0.8 10*3/uL (ref 0.1–1.0)
Monocytes Relative: 8 %
Neutro Abs: 7.5 10*3/uL (ref 1.7–7.7)
Neutrophils Relative %: 76 %
Platelets: 313 10*3/uL (ref 150–400)
RBC: 4.6 MIL/uL (ref 3.87–5.11)
RDW: 13.1 % (ref 11.5–15.5)
WBC: 10 10*3/uL (ref 4.0–10.5)
nRBC: 0 % (ref 0.0–0.2)

## 2021-09-06 LAB — COMPREHENSIVE METABOLIC PANEL
ALT: 15 U/L (ref 0–44)
AST: 17 U/L (ref 15–41)
Albumin: 4.5 g/dL (ref 3.5–5.0)
Alkaline Phosphatase: 58 U/L (ref 38–126)
Anion gap: 9 (ref 5–15)
BUN: 14 mg/dL (ref 6–20)
CO2: 25 mmol/L (ref 22–32)
Calcium: 9.6 mg/dL (ref 8.9–10.3)
Chloride: 105 mmol/L (ref 98–111)
Creatinine, Ser: 0.74 mg/dL (ref 0.44–1.00)
GFR, Estimated: >60 mL/min (ref >60)
Glucose, Bld: 92 mg/dL (ref 70–99)
Potassium: 3.6 mmol/L (ref 3.5–5.1)
Sodium: 139 mmol/L (ref 135–145)
Total Bilirubin: 1.5 mg/dL — ABNORMAL HIGH (ref 0.3–1.2)
Total Protein: 7.6 g/dL (ref 6.5–8.1)

## 2021-09-06 MED ORDER — IOHEXOL 300 MG/ML  SOLN
100.0000 mL | Freq: Once | INTRAMUSCULAR | Status: AC | PRN
  Administered 2021-09-06: 100 mL via INTRAVENOUS

## 2021-09-06 NOTE — ED Triage Notes (Signed)
Reports RUQ abdominal pain for the last 3 days. Nauseated but denies vomiting. Pt admits chills but denies fevers.

## 2021-09-06 NOTE — ED Provider Triage Note (Addendum)
Emergency Medicine Provider Triage Evaluation Note  Maria [name] , a 51 y.o. female  was evaluated in triage.  Pt complains of abdominal pain. States that same began on Friday and has been worsening since. Some associated nausea and vomiting without diarrhea. Denies oral intake as a trigger but states she has barely eaten anything since the pain started. Hx of cholecystectomy with associated pancreatitis, states that this pain feels similar. Denies dysuria  Review of Systems  Positive: Abdominal pain, n/v Negative: Fever, chills  Physical Exam  BP 106/82    Pulse 84    Temp 97.8 F (36.6 C)    Resp 18    Ht 4\' 11"  (1.499 m)    Wt 62.6 kg    LMP 09/26/2015 (Exact Date)    SpO2 100%    BMI 27.87 kg/m  Gen:   Awake, visibly uncomfortable and diaphoretic  Resp:  Normal effort  MSK:   Moves extremities without difficulty  Other:  Mildly tender on the right side to deep palpation.  Medical Decision Making  Medically screening exam initiated at 8:46 PM.  Appropriate orders placed.  Maria [name] was informed that the remainder of the evaluation will be completed by another provider, this initial triage assessment does not replace that evaluation, and the importance of remaining in the ED until their evaluation is complete.     Nestor Lewandowsky 09/06/21 2049    Bud Face, PA-C 09/06/21 2049

## 2021-09-07 ENCOUNTER — Encounter: Payer: Self-pay | Admitting: Family Medicine

## 2021-09-07 NOTE — ED Notes (Signed)
Pt upset about wait times and not seeing a doctor. Pt explained delay of care and informed that the PA has been in to see them twice. IV was started in triage so that we could obtain all possible results. Request IV be removed to leave prior to room placement. Encouraged to return for worsening symptoms.

## 2021-09-09 ENCOUNTER — Other Ambulatory Visit: Payer: Self-pay

## 2021-11-15 ENCOUNTER — Other Ambulatory Visit: Payer: Self-pay | Admitting: Family Medicine

## 2021-11-15 ENCOUNTER — Other Ambulatory Visit: Payer: Self-pay

## 2021-11-15 MED FILL — Albuterol Sulfate Inhal Aero 108 MCG/ACT (90MCG Base Equiv): RESPIRATORY_TRACT | 25 days supply | Qty: 8.5 | Fill #0 | Status: AC

## 2021-11-15 NOTE — Telephone Encounter (Signed)
Patient hasn't been seen since 07/2020. Refill sent but needs visit. ?

## 2021-12-14 ENCOUNTER — Telehealth (INDEPENDENT_AMBULATORY_CARE_PROVIDER_SITE_OTHER): Payer: Self-pay | Admitting: Family Medicine

## 2021-12-14 DIAGNOSIS — Z8709 Personal history of other diseases of the respiratory system: Secondary | ICD-10-CM

## 2021-12-14 DIAGNOSIS — R0981 Nasal congestion: Secondary | ICD-10-CM

## 2021-12-14 DIAGNOSIS — J029 Acute pharyngitis, unspecified: Secondary | ICD-10-CM

## 2021-12-14 DIAGNOSIS — R059 Cough, unspecified: Secondary | ICD-10-CM

## 2021-12-14 DIAGNOSIS — H5789 Other specified disorders of eye and adnexa: Secondary | ICD-10-CM

## 2021-12-14 MED ORDER — ERYTHROMYCIN 5 MG/GM OP OINT: 1.0000 "application " | TOPICAL_OINTMENT | Freq: Three times a day (TID) | OPHTHALMIC | 0 refills | Status: DC

## 2021-12-14 MED ORDER — ALBUTEROL SULFATE HFA 108 (90 BASE) MCG/ACT IN AERS: 2.0000 | INHALATION_SPRAY | Freq: Four times a day (QID) | RESPIRATORY_TRACT | 3 refills | Status: DC | PRN

## 2021-12-14 MED ORDER — BENZONATATE 100 MG PO CAPS: ORAL_CAPSULE | ORAL | 0 refills | Status: DC

## 2021-12-14 NOTE — Progress Notes (Signed)
Virtual Visit via Video Note ? ?I connected with Maria [name] ? on 12/14/21 at 12:20 PM EDT by a video enabled telemedicine application and verified that I am speaking with the correct person using two identifiers. ? Location patient: [place] ?Location provider:work or home office ?Persons participating in the virtual visit: patient, provider ? ?I discussed the limitations and requested verbal permission for telemedicine visit. The patient expressed understanding and agreed to proceed. ? ? ?HPI: ? ?Acute telemedicine visit for ? Covid : ?-Onset: ~ 3 days ago ?-Symptoms include: lots of nasal congestion, sore throat, drainage, eyes irritated and drainage  R eye started today, coughing, subjective fevers, some mild SOB at times - thinks is because she is anxious - can breath ok, but requests alb refill  ?-Denies: CP, NVD, inability to drink fluids, inability to get out of bed ?-Pertinent past medical history: see below ?-Pertinent medication allergies:No Known Allergies ?-COVID-19 vaccine status:  ?Immunization History  ?Administered Date(s) Administered  ? Influenza,inj,Quad PF,6+ Mos 05/29/2015, 06/09/2016  ? Tdap 11/21/2014  ? ? ? ?ROS: See pertinent positives and negatives per HPI. ? ?Past Medical History:  ?Diagnosis Date  ? Asthma   ? followed by pcp  ? Generalized anxiety disorder   ? Hiatal hernia   ? History of anemia   ? History of pancreatitis   ? 2017 due to gallstones-- resolved  ? Hypothyroidism   ? endocrinologist-- dr Dwyane Dee  ? IBS (irritable bowel syndrome)   ? Ovarian cyst   ? Bilateral  ? Seasonal allergies   ? Wears glasses   ? ? ?Past Surgical History:  ?Procedure Laterality Date  ? ABDOMINAL HYSTERECTOMY  2017  ? heavy bleeding; no cancer  ? BREAST EXCISIONAL BIOPSY Left 2017  ? benign  ? BREAST LUMPECTOMY WITH RADIOACTIVE SEED LOCALIZATION Left 01/22/2016  ? Procedure: LEFT BREAST LUMPECTOMY WITH RADIOACTIVE SEED LOCALIZATION;  Surgeon: Fanny Skates, MD;  Location: [facility];  Service:  General;  Laterality: Left;  ? CESAREAN SECTION  x3  last one 2005  ? BILATERAL TUBAL LIGATION WITH LAST C/S  ? CHOLECYSTECTOMY N/A 03/15/2016  ? Procedure: LAPAROSCOPIC CHOLECYSTECTOMY WITH INTRAOPERATIVE CHOLANGIOGRAM;  Surgeon: Jackolyn Confer, MD;  Location: [facility];  Service: General;  Laterality: N/A;  ? COLONOSCOPY WITH ESOPHAGOGASTRODUODENOSCOPY (EGD)  06/18/2019  ? CYSTO N/A 10/06/2015  ? Procedure: CYSTOSCOPY;  Surgeon: Nunzio Cobbs, MD;  Location: [facility];  Service: Gynecology;  Laterality: N/A;  ? ? ? ?Current Outpatient Medications:  ?  albuterol (VENTOLIN HFA) 108 (90 Base) MCG/ACT inhaler, INHALE 1 PUFF BY MOUTH EVERY 6 HOURS AS NEEDED FOR WHEEZING OR SHORTNESS OF BREATH, Disp: 8.5 g, Rfl: 0 ?  loratadine (CLARITIN) 10 MG tablet, Take 1 tablet (10 mg total) by mouth daily., Disp: 90 tablet, Rfl: 0 ?  naproxen (NAPROSYN) 500 MG tablet, Take 1 tablet (500 mg total) by mouth 2 (two) times daily., Disp: 30 tablet, Rfl: 0 ?  thyroid (NP THYROID) 60 MG tablet, Take 1 tablet (60 mg total) by mouth daily before breakfast., Disp: , Rfl:  ?  thyroid (NP THYROID) 60 MG tablet, TAKE 1 TABLET BY MOUTH ONCE DAILY IN THE MORNING ON EMPTY STOMACH, Disp: 30 tablet, Rfl: 1 ?  thyroid (NP THYROID) 60 MG tablet, TAKE 1 TABLET BY MOUTH ONCE DAILY IN THE MORNING ON EMPTY STOMACH, Disp: 90 tablet, Rfl: 3 ?  venlafaxine XR (EFFEXOR-XR) 150 MG 24 hr capsule, TAKE 1 CAPSULE BY MOUTH ONCE DAILY, Disp: 30 capsule, Rfl: 0 ? ?  EXAM: ? ?VITALS per patient if applicable: ? ?GENERAL: alert, oriented, appears well and in no acute distress ? ?HEENT: atraumatic, conjunttiva clear, no obvious abnormalities on inspection of external nose and ears ? ?NECK: normal movements of the head and neck ? ?LUNGS: on inspection appears to be breathing comfortably, no signs of respiratory distress, breathing rate appears normal, no obvious gross SOB, gasping or wheezing ? ?CV: no obvious cyanosis ? ?MS: moves all visible extremities without  noticeable abnormality ? ?PSYCH/NEURO: pleasant and cooperative, no obvious depression or anxiety, speech and thought processing grossly intact ? ?ASSESSMENT AND PLAN: ? ?Discussed the following assessment and plan: ? ?No diagnosis found. ? ?-we discussed possible serious and likely etiologies, options for evaluation and workup, and precautions. Pt is agreeable to treatment via telemedicine at this moment. Query VURI - posisble covid, possibly with conjunctivitis/bronchitis vs other. She has not tested for covid as does not wish to pursue antiviral treatment. Did discuss testing, antiviral options. She prefers to try nasal saline sinus rinses, tessalon rx for cough, alb if needed.  ?Work/School slipped offered: declined ?Advised to seek prompt virtual visit or in person care if worsening, new symptoms arise, or if is not improving with treatment as expected per our conversation of expected course. Discussed options for follow up care. ?  ?I discussed the assessment and treatment plan with the patient. The patient was provided an opportunity to ask questions and all were answered. The patient agreed with the plan and demonstrated an understanding of the instructions. ?  ? ? ?Lucretia Kern, DO  ? ?

## 2021-12-14 NOTE — Patient Instructions (Signed)
?  HOME CARE TIPS: ? ?-COVID19 testing information: ?ForwardDrop.tn ? ?Most pharmacies also offer testing and home test kits. If the Covid19 test is positive and you desire antiviral treatment, please contact a [facility] or schedule a follow up virtual visit through your primary care office or through the [facility].  ?Other test to treat options: ?ConnectRV.is?click_[email or web address]=alert ? ?-I sent the medication(s) we discussed to your pharmacy: ?Meds ordered this encounter  ?Medications  ? erythromycin ophthalmic ointment  ?  Sig: Place 1 application. into the right eye 3 (three) times daily.  ?  Dispense:  3.5 g  ?  Refill:  0  ? albuterol (VENTOLIN HFA) 108 (90 Base) MCG/ACT inhaler  ?  Sig: Inhale 2 puffs into the lungs every 6 (six) hours as needed for wheezing or shortness of breath.  ?  Dispense:  1 each  ?  Refill:  3  ? benzonatate (TESSALON PERLES) 100 MG capsule  ?  Sig: 1-2 capsules up to twice daily as needed for cough  ?  Dispense:  30 capsule  ?  Refill:  0  ?  ? ?-there is a chance of rebound illness with covid after improving. This can happen whether or not you take an antiviral treatment. If you become sick again with covid after getting better, please schedule a follow up virtual visit and isolate again. ? ?-nasal saline sinus rinses twice daily ? ?-stay hydrated, drink plenty of fluids and eat small healthy meals - avoid dairy ? ?-can take 1000 IU (14mg) Vit D3 and 100-500 mg of Vit C daily per instructions ? ?-follow up with your doctor in 2-3 days unless improving and feeling better ? ?-stay home while sick, except to seek medical care. If you have COVID19, you will likely be contagious for 7-10 days. Flu or Influenza is likely contagious for about 7 days. Other respiratory viral infections remain contagious for 5-10+ days depending on the virus and many other factors. Wear a good mask that fits snugly (such as N95 or KN95) if around  others to reduce the risk of transmission. ? ?It was nice to meet you today, and I really hope you are feeling better soon. I help [facility] out with telemedicine visits on Tuesdays and Thursdays and am happy to help if you need a follow up virtual visit on those days. Otherwise, if you have any concerns or questions following this visit please schedule a follow up visit with your Primary Care doctor or seek care at a local urgent care clinic to avoid delays in care.  ? ? ?Seek in person care or schedule a follow up video visit promptly if your symptoms worsen, new concerns arise or you are not improving with treatment. Call 911 and/or seek emergency care if your symptoms are severe or life threatening. ? ? ?

## 2022-04-14 ENCOUNTER — Telehealth: Payer: Self-pay

## 2022-04-14 ENCOUNTER — Telehealth (INDEPENDENT_AMBULATORY_CARE_PROVIDER_SITE_OTHER): Payer: Self-pay | Admitting: Family Medicine

## 2022-04-14 ENCOUNTER — Other Ambulatory Visit: Payer: Self-pay | Admitting: Family Medicine

## 2022-04-14 VITALS — Ht 59.0 in | Wt 125.0 lb

## 2022-04-14 DIAGNOSIS — J4520 Mild intermittent asthma, uncomplicated: Secondary | ICD-10-CM

## 2022-04-14 DIAGNOSIS — J309 Allergic rhinitis, unspecified: Secondary | ICD-10-CM

## 2022-04-14 MED ORDER — ALBUTEROL SULFATE HFA 108 (90 BASE) MCG/ACT IN AERS: 2.0000 | INHALATION_SPRAY | Freq: Four times a day (QID) | RESPIRATORY_TRACT | 3 refills | Status: DC | PRN

## 2022-04-14 NOTE — Patient Instructions (Signed)
-  I sent the medication(s) we discussed to your pharmacy: Meds ordered this encounter  Medications   albuterol (VENTOLIN HFA) 108 (90 Base) MCG/ACT inhaler    Sig: Inhale 2 puffs into the lungs every 6 (six) hours as needed for wheezing or shortness of breath.    Dispense:  1 each    Refill:  3   Consider taking allergy pill during the fall/allergy season.  I hope you are feeling better soon!  Seek follow up care promptly if your symptoms worsen, do not respond to albuterol, you require the albuterol more than a few days per wee, new concerns arise or you are not improving with treatment.   help [facility] out with telemedicine visits on Tuesdays and Thursdays and am happy to help if you need a virtual follow up visit on those days. Otherwise, if you have any concerns or questions following this visit please schedule a follow up visit with your Primary Care office or seek care at a local urgent care clinic to avoid delays in care. If you are having severe or life threatening symptoms please call 911 and/or go to the nearest emergency room.

## 2022-04-14 NOTE — Telephone Encounter (Signed)
Attempt to reach patient in regards to scheduling an appointment for med refill.  Left  a voicemail to call us back.

## 2022-04-14 NOTE — Progress Notes (Signed)
Virtual Visit via Telephone Note  I connected with Maria [name] on 04/14/22 at  5:20 PM EDT by telephone and verified that I am speaking with the correct person using two identifiers.   I discussed the limitations of performing an evaluation and management service by telephone and requested permission for a phone visit. The patient expressed understanding and agreed to proceed.  Location patient:  [place] Location provider: work or home office Participants present for the call: patient, provider Patient did not have a visit with me in the prior 7 days to address this/these issue(s).   History of Present Illness:  Acute telemedicine visit for follow up Asthma/Allergies: -reports they always tend to be worse in the fall and she wants to have refill of her albuterol -currently between PCPs as prior PCP left and she plans to schedule with new one -only tends to need it if outside and particularly if outside exercising will get sneezing, rhinorrhea, a little asthma symptoms - that resolve with the albuterol -not currently taking claritin -does not feel needs albuterol frequently -Pertinent past medical history: see below -Pertinent medication allergies: No Known Allergies -COVID-19 vaccine status: Immunization History  Administered Date(s) Administered   Influenza,inj,Quad PF,6+ Mos 05/29/2015, 06/09/2016   Tdap 11/21/2014      Past Medical History:  Diagnosis Date   Asthma    followed by pcp   Generalized anxiety disorder    Hiatal hernia    History of anemia    History of pancreatitis    2017 due to gallstones-- resolved   Hypothyroidism    endocrinologist-- dr Dwyane Dee   IBS (irritable bowel syndrome)    Ovarian cyst    Bilateral   Seasonal allergies    Wears glasses     Current Outpatient Medications on File Prior to Visit  Medication Sig Dispense Refill   benzonatate (TESSALON PERLES) 100 MG capsule 1-2 capsules up to twice daily as needed for cough (Patient not  taking: Reported on 04/14/2022) 30 capsule 0   erythromycin ophthalmic ointment Place 1 application. into the right eye 3 (three) times daily. (Patient not taking: Reported on 04/14/2022) 3.5 g 0   loratadine (CLARITIN) 10 MG tablet Take 1 tablet (10 mg total) by mouth daily. (Patient not taking: Reported on 04/14/2022) 90 tablet 0   naproxen (NAPROSYN) 500 MG tablet Take 1 tablet (500 mg total) by mouth 2 (two) times daily. (Patient not taking: Reported on 04/14/2022) 30 tablet 0   thyroid (NP THYROID) 60 MG tablet Take 1 tablet (60 mg total) by mouth daily before breakfast. (Patient not taking: Reported on 04/14/2022)     thyroid (NP THYROID) 60 MG tablet TAKE 1 TABLET BY MOUTH ONCE DAILY IN THE MORNING ON EMPTY STOMACH (Patient not taking: Reported on 04/14/2022) 30 tablet 1   thyroid (NP THYROID) 60 MG tablet TAKE 1 TABLET BY MOUTH ONCE DAILY IN THE MORNING ON EMPTY STOMACH (Patient not taking: Reported on 04/14/2022) 90 tablet 3   venlafaxine XR (EFFEXOR-XR) 150 MG 24 hr capsule TAKE 1 CAPSULE BY MOUTH ONCE DAILY (Patient not taking: Reported on 04/14/2022) 30 capsule 0   No current facility-administered medications on file prior to visit.    Observations/Objective: Patient sounds cheerful and well on the phone. I do not appreciate any SOB. Speech and thought processing are grossly intact. Patient reported vitals: none  Assessment and Plan:  Mild intermittent asthma without complication  Allergic rhinitis, unspecified seasonality, unspecified trigger  -discussed considering adding back antihistamine during the fall -  albuterol refilled -Advised to seek prompt virtual visit or in person care if worsening, new symptoms arise, or if is not improving with treatment as expected per our conversation of expected course. Discussed options for follow up care.   I discussed the assessment and treatment plan with the patient. The patient was provided an opportunity to ask questions and all were  answered. The patient agreed with the plan and demonstrated an understanding of the instructions.    Follow Up Instructions:  I did not refer this patient for an OV with me in the next 24 hours for this/these issue(s).  I discussed the assessment and treatment plan with the patient. The patient was provided an opportunity to ask questions and all were answered. The patient agreed with the plan and demonstrated an understanding of the instructions.   I spent 11 minutes on the date of this visit in the care of this patient. See summary of tasks completed to properly care for this patient in the detailed notes above which also included counseling of above, review of PMH, medications, allergies, evaluation of the patient and ordering and/or  instructing patient on testing and care options.     Lucretia Kern, DO

## 2022-08-06 ENCOUNTER — Other Ambulatory Visit: Payer: Self-pay | Admitting: Family Medicine

## 2022-08-22 ENCOUNTER — Telehealth: Payer: Self-pay | Admitting: Family Medicine

## 2022-08-23 NOTE — Telephone Encounter (Signed)
Pt has TOC with dr Legrand Como on 08-24-2022 and would like a refill on albuterol (VENTOLIN HFA) 108 (90 Base) MCG/ACT inhaler

## 2022-08-23 NOTE — Telephone Encounter (Signed)
Noted  

## 2022-08-23 NOTE — Telephone Encounter (Signed)
Pt states she is wheezing due to asthma and was transfer to triage nurse

## 2022-08-23 NOTE — Telephone Encounter (Signed)
Please advise appt. Thanks.

## 2022-08-24 ENCOUNTER — Ambulatory Visit: Payer: Medicaid Other | Admitting: Family Medicine

## 2022-08-24 ENCOUNTER — Encounter: Payer: Self-pay | Admitting: Family Medicine

## 2022-08-24 VITALS — BP 138/80 | HR 49 | Temp 97.8°F | Ht 59.0 in | Wt 123.3 lb

## 2022-08-24 DIAGNOSIS — E559 Vitamin D deficiency, unspecified: Secondary | ICD-10-CM

## 2022-08-24 DIAGNOSIS — Z13220 Encounter for screening for lipoid disorders: Secondary | ICD-10-CM | POA: Diagnosis not present

## 2022-08-24 DIAGNOSIS — Z1231 Encounter for screening mammogram for malignant neoplasm of breast: Secondary | ICD-10-CM

## 2022-08-24 DIAGNOSIS — E88819 Insulin resistance, unspecified: Secondary | ICD-10-CM | POA: Diagnosis not present

## 2022-08-24 DIAGNOSIS — E038 Other specified hypothyroidism: Secondary | ICD-10-CM

## 2022-08-24 DIAGNOSIS — J4520 Mild intermittent asthma, uncomplicated: Secondary | ICD-10-CM

## 2022-08-24 DIAGNOSIS — R1011 Right upper quadrant pain: Secondary | ICD-10-CM

## 2022-08-24 LAB — COMPREHENSIVE METABOLIC PANEL
ALT: 11 U/L (ref 0–35)
AST: 14 U/L (ref 0–37)
Albumin: 4.4 g/dL (ref 3.5–5.2)
Alkaline Phosphatase: 64 U/L (ref 39–117)
BUN: 10 mg/dL (ref 6–23)
CO2: 27 mEq/L (ref 19–32)
Calcium: 9.6 mg/dL (ref 8.4–10.5)
Chloride: 107 mEq/L (ref 96–112)
Creatinine, Ser: 0.86 mg/dL (ref 0.40–1.20)
GFR: 77.61 mL/min (ref >60.00)
Glucose, Bld: 81 mg/dL (ref 70–99)
Potassium: 4.1 mEq/L (ref 3.5–5.1)
Sodium: 144 mEq/L (ref 135–145)
Total Bilirubin: 0.5 mg/dL (ref 0.2–1.2)
Total Protein: 6.6 g/dL (ref 6.0–8.3)

## 2022-08-24 LAB — LIPID PANEL
Cholesterol: 232 mg/dL — ABNORMAL HIGH (ref 0–200)
HDL: 89.7 mg/dL (ref >39.00)
LDL Cholesterol: 128 mg/dL — ABNORMAL HIGH (ref 0–99)
NonHDL: 141.95
Total CHOL/HDL Ratio: 3
Triglycerides: 68 mg/dL (ref 0.0–149.0)
VLDL: 13.6 mg/dL (ref 0.0–40.0)

## 2022-08-24 LAB — TSH: TSH: 4.63 u[IU]/mL (ref 0.35–5.50)

## 2022-08-24 LAB — VITAMIN D 25 HYDROXY (VIT D DEFICIENCY, FRACTURES): VITD: 20.63 ng/mL — ABNORMAL LOW (ref 30.00–100.00)

## 2022-08-24 MED ORDER — ALBUTEROL SULFATE HFA 108 (90 BASE) MCG/ACT IN AERS: 2.0000 | INHALATION_SPRAY | Freq: Four times a day (QID) | RESPIRATORY_TRACT | 3 refills | Status: DC | PRN

## 2022-08-24 NOTE — Progress Notes (Signed)
Established Patient Office Visit  Subjective   Patient ID: Maria [name], female    DOB: 1970/10/31  Age: 52 y.o. MRN: [id number]  Chief Complaint  Patient presents with   Establish Care    Patient is here for transition of care visit.   Patient is reporting a "pinching" sensation in the right upper quadrant, states that it is associated with food/ eating. She denies any nausea or vomiting, no changes in BM's or stool color/ consistency. Pt reports she was told she had adhesions in the past due to mutliple abdominal surgeries, no bloating or gas pain, no acid reflux symptoms. I reviewed her previous colonoscopy and EGD results. Pt had a CT of her abd/pelvis in January 2023 which was largely unremarkable. EGD showed a small hiatal hernia and some mild gastric erythema.  Pt reports that she stopped all of her previous medications, reports that she lost weight, changed her diet and was feeling much better so now she is only on an albuterol inhaler PRN.    Current Outpatient Medications  Medication Instructions   albuterol (VENTOLIN HFA) 108 (90 Base) MCG/ACT inhaler 2 puffs, Inhalation, Every 6 hours PRN   Vitamin D (Ergocalciferol) (DRISDOL) 50,000 Units, Oral, Every 7 days    Patient Active Problem List   Diagnosis Date Noted   Left ovarian cyst 05/15/2019   Right ovarian cyst 05/15/2019   Vitamin D deficiency 12/14/2017   Insulin resistance 12/14/2017   Genetic testing 06/20/2016   Family history of breast cancer in female 05/12/2016   Family history of colorectal cancer 05/12/2016   Fatty liver 02/22/2016   Abnormal mammogram of left breast 01/22/2016   Breast cyst 11/21/2014   Menorrhagia 11/21/2014   Hypothyroidism 11/21/2014   Obesity 11/21/2014   Seasonal allergies 11/21/2014   Asthma, chronic 11/21/2014   Generalized anxiety disorder 11/21/2014      Review of Systems  All other systems reviewed and are negative.     Objective:     BP 138/80 (BP  Location: Right Arm, Patient Position: Sitting, Cuff Size: Normal)   Pulse (!) 49   Temp 97.8 F (36.6 C) (Oral)   Ht '4\' 11"'$  (1.499 m)   Wt 123 lb 4.8 oz (55.9 kg)   LMP 09/26/2015   SpO2 97%   BMI 24.90 kg/m    Physical Exam Vitals reviewed.  Constitutional:      Appearance: Normal appearance. She is well-groomed and normal weight.  Eyes:     Conjunctiva/sclera: Conjunctivae normal.  Neck:     Thyroid: No thyromegaly.  Cardiovascular:     Rate and Rhythm: Normal rate and regular rhythm.     Pulses: Normal pulses.     Heart sounds: S1 normal and S2 normal.  Pulmonary:     Effort: Pulmonary effort is normal.     Breath sounds: Normal breath sounds and air entry.  Abdominal:     General: Abdomen is flat. Bowel sounds are normal.     Palpations: Abdomen is soft.     Tenderness: There is no guarding or rebound.  Musculoskeletal:     Right lower leg: No edema.     Left lower leg: No edema.  Neurological:     Mental Status: She is alert and oriented to person, place, and time. Mental status is at baseline.     Gait: Gait is intact.  Psychiatric:        Mood and Affect: Mood and affect normal.  Speech: Speech normal.        Behavior: Behavior normal.        Judgment: Judgment normal.       The 10-year ASCVD risk score (Arnett DK, et al., 2019) is: 3.2%    Assessment & Plan:   Problem List Items Addressed This Visit       Unprioritized   Hypothyroidism - Primary    Off all medications, will check new TSH      Relevant Orders   TSH (Completed)   Asthma, chronic    Lungs clear on exam, pt reports she does not smoke every day, just once in a while. Continue albuterol 90 mcg inhaler, 2 puffs every 6 hours as needed for SOB. Refilled this for her today      Relevant Medications   albuterol (VENTOLIN HFA) 108 (90 Base) MCG/ACT inhaler   Vitamin D deficiency    Needs new level to follow up      Relevant Medications   Vitamin D, Ergocalciferol, (DRISDOL)  1.25 MG (50000 UNIT) CAPS capsule   Other Relevant Orders   Vitamin D, 25-hydroxy (Completed)   Insulin resistance    Pt states she changed her diet and lost weight, will check CMP      Relevant Orders   CMP (Completed)   Other Visit Diagnoses     Lipid screening       Relevant Orders   Lipid Panel (Completed)   Encounter for screening mammogram for malignant neoplasm of breast       Relevant Orders   MM Digital Screening   RUQ abdominal pain      Unclear etiology, physical exam findings are reassuring, I recommended trying pepcid AC 1 tablet twice a day to see if this will help.      Return in about 1 year (around 08/25/2023) for Annual Physical Exam.    Farrel Conners, MD

## 2022-08-24 NOTE — Patient Instructions (Signed)
Pepcid AC 1 tablet twice day

## 2022-08-25 MED ORDER — VITAMIN D (ERGOCALCIFEROL) 1.25 MG (50000 UNIT) PO CAPS: 50000.0000 [IU] | ORAL_CAPSULE | ORAL | 1 refills | Status: AC

## 2022-08-25 NOTE — Assessment & Plan Note (Signed)
Lungs clear on exam, pt reports she does not smoke every day, just once in a while. Continue albuterol 90 mcg inhaler, 2 puffs every 6 hours as needed for SOB. Refilled this for her today

## 2022-08-25 NOTE — Assessment & Plan Note (Signed)
Pt states she changed her diet and lost weight, will check CMP

## 2022-08-25 NOTE — Assessment & Plan Note (Signed)
Off all medications, will check new TSH

## 2022-08-25 NOTE — Assessment & Plan Note (Signed)
Needs new level to follow up

## 2023-08-13 ENCOUNTER — Other Ambulatory Visit: Payer: Self-pay | Admitting: Family Medicine

## 2023-08-13 DIAGNOSIS — J4520 Mild intermittent asthma, uncomplicated: Secondary | ICD-10-CM
